# Patient Record
Sex: Female | Born: 1935
Health system: Southern US, Community
[De-identification: ages and names within clinical notes are randomized; demographics above are authoritative.]

## PROBLEM LIST (undated history)

## (undated) DIAGNOSIS — K5730 Diverticulosis of large intestine without perforation or abscess without bleeding: Secondary | ICD-10-CM

## (undated) DIAGNOSIS — K648 Other hemorrhoids: Secondary | ICD-10-CM

## (undated) DIAGNOSIS — I712 Thoracic aortic aneurysm, without rupture: Secondary | ICD-10-CM

## (undated) DIAGNOSIS — Z87898 Personal history of other specified conditions: Secondary | ICD-10-CM

## (undated) DIAGNOSIS — R911 Solitary pulmonary nodule: Secondary | ICD-10-CM

## (undated) DIAGNOSIS — C786 Secondary malignant neoplasm of retroperitoneum and peritoneum: Secondary | ICD-10-CM

## (undated) DIAGNOSIS — K219 Gastro-esophageal reflux disease without esophagitis: Secondary | ICD-10-CM

## (undated) DIAGNOSIS — C801 Malignant (primary) neoplasm, unspecified: Secondary | ICD-10-CM

## (undated) DIAGNOSIS — Z85828 Personal history of other malignant neoplasm of skin: Secondary | ICD-10-CM

## (undated) DIAGNOSIS — Z8719 Personal history of other diseases of the digestive system: Secondary | ICD-10-CM

## (undated) DIAGNOSIS — H3530 Unspecified macular degeneration: Secondary | ICD-10-CM

## (undated) DIAGNOSIS — Z9889 Other specified postprocedural states: Secondary | ICD-10-CM

## (undated) DIAGNOSIS — M1990 Unspecified osteoarthritis, unspecified site: Secondary | ICD-10-CM

## (undated) DIAGNOSIS — I7121 Aneurysm of the ascending aorta, without rupture: Secondary | ICD-10-CM

## (undated) DIAGNOSIS — K449 Diaphragmatic hernia without obstruction or gangrene: Secondary | ICD-10-CM

## (undated) DIAGNOSIS — C189 Malignant neoplasm of colon, unspecified: Secondary | ICD-10-CM

## (undated) DIAGNOSIS — I2510 Atherosclerotic heart disease of native coronary artery without angina pectoris: Secondary | ICD-10-CM

## (undated) DIAGNOSIS — E042 Nontoxic multinodular goiter: Secondary | ICD-10-CM

## (undated) HISTORY — PX: VAGINAL HYSTERECTOMY: SUR661

## (undated) HISTORY — PX: INGUINAL HERNIA REPAIR: SUR1180

## (undated) HISTORY — PX: OTHER SURGICAL HISTORY: SHX169

## (undated) HISTORY — PX: UPPER GASTROINTESTINAL ENDOSCOPY: SHX188

---

## 1997-12-09 ENCOUNTER — Ambulatory Visit (HOSPITAL_COMMUNITY): Admission: RE | Admit: 1997-12-09 | Discharge: 1997-12-09 | Payer: Self-pay | Admitting: Obstetrics & Gynecology

## 1998-06-23 ENCOUNTER — Other Ambulatory Visit: Admission: RE | Admit: 1998-06-23 | Discharge: 1998-06-23 | Payer: Self-pay | Admitting: Obstetrics and Gynecology

## 1999-05-16 ENCOUNTER — Encounter: Payer: Self-pay | Admitting: Obstetrics and Gynecology

## 1999-05-16 ENCOUNTER — Ambulatory Visit (HOSPITAL_COMMUNITY): Admission: RE | Admit: 1999-05-16 | Discharge: 1999-05-16 | Payer: Self-pay | Admitting: Obstetrics and Gynecology

## 1999-06-08 ENCOUNTER — Other Ambulatory Visit: Admission: RE | Admit: 1999-06-08 | Discharge: 1999-06-08 | Payer: Self-pay | Admitting: Obstetrics and Gynecology

## 2000-06-06 ENCOUNTER — Ambulatory Visit (HOSPITAL_COMMUNITY): Admission: RE | Admit: 2000-06-06 | Discharge: 2000-06-06 | Payer: Self-pay | Admitting: Obstetrics and Gynecology

## 2000-06-06 ENCOUNTER — Encounter: Payer: Self-pay | Admitting: Obstetrics and Gynecology

## 2000-08-22 ENCOUNTER — Other Ambulatory Visit: Admission: RE | Admit: 2000-08-22 | Discharge: 2000-08-22 | Payer: Self-pay | Admitting: Obstetrics and Gynecology

## 2001-07-03 ENCOUNTER — Ambulatory Visit (HOSPITAL_COMMUNITY): Admission: RE | Admit: 2001-07-03 | Discharge: 2001-07-03 | Payer: Self-pay | Admitting: Obstetrics and Gynecology

## 2001-07-03 ENCOUNTER — Encounter: Payer: Self-pay | Admitting: Obstetrics and Gynecology

## 2001-07-17 ENCOUNTER — Encounter: Payer: Self-pay | Admitting: Obstetrics and Gynecology

## 2001-07-17 ENCOUNTER — Encounter: Admission: RE | Admit: 2001-07-17 | Discharge: 2001-07-17 | Payer: Self-pay | Admitting: Obstetrics and Gynecology

## 2001-07-31 ENCOUNTER — Encounter: Admission: RE | Admit: 2001-07-31 | Discharge: 2001-07-31 | Payer: Self-pay | Admitting: Family Medicine

## 2001-07-31 ENCOUNTER — Encounter: Payer: Self-pay | Admitting: Family Medicine

## 2002-07-09 ENCOUNTER — Encounter: Payer: Self-pay | Admitting: Obstetrics and Gynecology

## 2002-07-09 ENCOUNTER — Ambulatory Visit (HOSPITAL_COMMUNITY): Admission: RE | Admit: 2002-07-09 | Discharge: 2002-07-09 | Payer: Self-pay | Admitting: Obstetrics and Gynecology

## 2003-07-22 ENCOUNTER — Ambulatory Visit (HOSPITAL_COMMUNITY): Admission: RE | Admit: 2003-07-22 | Discharge: 2003-07-22 | Payer: Self-pay | Admitting: Obstetrics and Gynecology

## 2003-08-03 ENCOUNTER — Ambulatory Visit (HOSPITAL_COMMUNITY): Admission: RE | Admit: 2003-08-03 | Discharge: 2003-08-03 | Payer: Self-pay | Admitting: Family Medicine

## 2003-08-19 ENCOUNTER — Encounter (INDEPENDENT_AMBULATORY_CARE_PROVIDER_SITE_OTHER): Payer: Self-pay | Admitting: Specialist

## 2003-08-19 ENCOUNTER — Ambulatory Visit (HOSPITAL_COMMUNITY): Admission: RE | Admit: 2003-08-19 | Discharge: 2003-08-19 | Payer: Self-pay | Admitting: Family Medicine

## 2004-07-06 ENCOUNTER — Ambulatory Visit (HOSPITAL_COMMUNITY): Admission: RE | Admit: 2004-07-06 | Discharge: 2004-07-06 | Payer: Self-pay | Admitting: Family Medicine

## 2004-08-08 ENCOUNTER — Ambulatory Visit (HOSPITAL_COMMUNITY): Admission: RE | Admit: 2004-08-08 | Discharge: 2004-08-08 | Payer: Self-pay | Admitting: Obstetrics and Gynecology

## 2005-08-23 ENCOUNTER — Ambulatory Visit (HOSPITAL_COMMUNITY): Admission: RE | Admit: 2005-08-23 | Discharge: 2005-08-23 | Payer: Self-pay | Admitting: Obstetrics and Gynecology

## 2006-06-24 ENCOUNTER — Emergency Department (HOSPITAL_COMMUNITY): Admission: EM | Admit: 2006-06-24 | Discharge: 2006-06-24 | Payer: Self-pay | Admitting: Emergency Medicine

## 2006-06-26 ENCOUNTER — Emergency Department (HOSPITAL_COMMUNITY): Admission: EM | Admit: 2006-06-26 | Discharge: 2006-06-26 | Payer: Self-pay | Admitting: *Deleted

## 2006-08-28 ENCOUNTER — Ambulatory Visit (HOSPITAL_COMMUNITY): Admission: RE | Admit: 2006-08-28 | Discharge: 2006-08-28 | Payer: Self-pay | Admitting: Obstetrics and Gynecology

## 2006-11-14 ENCOUNTER — Emergency Department (HOSPITAL_COMMUNITY): Admission: EM | Admit: 2006-11-14 | Discharge: 2006-11-14 | Payer: Self-pay | Admitting: Emergency Medicine

## 2007-02-13 HISTORY — PX: ESOPHAGOGASTRODUODENOSCOPY: SHX1529

## 2007-08-05 ENCOUNTER — Emergency Department (HOSPITAL_COMMUNITY): Admission: EM | Admit: 2007-08-05 | Discharge: 2007-08-05 | Payer: Self-pay | Admitting: Emergency Medicine

## 2007-09-09 ENCOUNTER — Ambulatory Visit (HOSPITAL_COMMUNITY): Admission: RE | Admit: 2007-09-09 | Discharge: 2007-09-09 | Payer: Self-pay | Admitting: Internal Medicine

## 2007-12-04 ENCOUNTER — Encounter: Payer: Self-pay | Admitting: Internal Medicine

## 2007-12-24 ENCOUNTER — Encounter: Payer: Self-pay | Admitting: Internal Medicine

## 2007-12-29 ENCOUNTER — Ambulatory Visit: Payer: Self-pay | Admitting: Internal Medicine

## 2007-12-29 DIAGNOSIS — K219 Gastro-esophageal reflux disease without esophagitis: Secondary | ICD-10-CM | POA: Insufficient documentation

## 2007-12-29 DIAGNOSIS — K222 Esophageal obstruction: Secondary | ICD-10-CM | POA: Insufficient documentation

## 2008-01-15 ENCOUNTER — Ambulatory Visit: Payer: Self-pay | Admitting: Internal Medicine

## 2008-05-06 ENCOUNTER — Ambulatory Visit: Payer: Self-pay | Admitting: Internal Medicine

## 2009-05-12 ENCOUNTER — Ambulatory Visit (HOSPITAL_COMMUNITY): Admission: RE | Admit: 2009-05-12 | Discharge: 2009-05-12 | Payer: Self-pay | Admitting: Obstetrics and Gynecology

## 2009-05-20 ENCOUNTER — Encounter: Admission: RE | Admit: 2009-05-20 | Discharge: 2009-05-20 | Payer: Self-pay | Admitting: Obstetrics and Gynecology

## 2010-05-16 ENCOUNTER — Other Ambulatory Visit (HOSPITAL_COMMUNITY): Payer: Self-pay | Admitting: Obstetrics and Gynecology

## 2010-05-16 DIAGNOSIS — Z1231 Encounter for screening mammogram for malignant neoplasm of breast: Secondary | ICD-10-CM

## 2010-06-05 ENCOUNTER — Ambulatory Visit (HOSPITAL_COMMUNITY)
Admission: RE | Admit: 2010-06-05 | Discharge: 2010-06-05 | Disposition: A | Discharge status: Home / Self Care | Payer: 59 | Source: Ambulatory Visit | Attending: Obstetrics and Gynecology | Admitting: Obstetrics and Gynecology

## 2010-06-05 DIAGNOSIS — Z1231 Encounter for screening mammogram for malignant neoplasm of breast: Secondary | ICD-10-CM | POA: Insufficient documentation

## 2010-06-07 ENCOUNTER — Other Ambulatory Visit: Payer: Self-pay | Admitting: Obstetrics and Gynecology

## 2010-06-07 DIAGNOSIS — R928 Other abnormal and inconclusive findings on diagnostic imaging of breast: Secondary | ICD-10-CM

## 2010-06-09 ENCOUNTER — Ambulatory Visit
Admission: RE | Admit: 2010-06-09 | Discharge: 2010-06-09 | Disposition: A | Discharge status: Home / Self Care | Payer: 59 | Source: Ambulatory Visit | Attending: Obstetrics and Gynecology | Admitting: Obstetrics and Gynecology

## 2010-06-09 DIAGNOSIS — R928 Other abnormal and inconclusive findings on diagnostic imaging of breast: Secondary | ICD-10-CM

## 2010-10-25 ENCOUNTER — Telehealth: Payer: Self-pay | Admitting: Internal Medicine

## 2010-10-25 NOTE — Telephone Encounter (Signed)
Dr Sonny Masters rlast colon in 2009, see under procedure tab.  Please advise proper recall

## 2010-10-25 NOTE — Telephone Encounter (Signed)
Unable to reach the patient I will continue to try and reach the patient

## 2010-10-25 NOTE — Telephone Encounter (Signed)
Would not recommend a colonoscopy based upon this  She can schedule an office visit to see me (elective) Need records from Dr. Felipa Eth

## 2010-10-25 NOTE — Telephone Encounter (Signed)
She has a small amount of rectal bleeding one time  No further bleeding.   She says Dr Felipa Eth did a rectal exam and said she has no hemorrhoids.   Dr Leone Payor please advise

## 2010-10-25 NOTE — Telephone Encounter (Signed)
Does not appear to be true considering no polyps on last colonoscopy Is she having signs or symptoms or did they do a hemoccult?

## 2010-10-26 NOTE — Telephone Encounter (Signed)
I spoke with the patient and advised Dr Marvell Fuller recommendations.  She will call and schedule an office visit if she has recurrent rectal bleeding

## 2010-12-08 ENCOUNTER — Other Ambulatory Visit: Payer: Self-pay

## 2011-05-08 ENCOUNTER — Other Ambulatory Visit (HOSPITAL_COMMUNITY): Payer: Self-pay | Admitting: Obstetrics and Gynecology

## 2011-05-08 DIAGNOSIS — Z1231 Encounter for screening mammogram for malignant neoplasm of breast: Secondary | ICD-10-CM

## 2011-06-07 ENCOUNTER — Ambulatory Visit (HOSPITAL_COMMUNITY): Payer: 59 | Attending: Obstetrics and Gynecology

## 2011-07-02 ENCOUNTER — Ambulatory Visit (HOSPITAL_COMMUNITY)
Admission: RE | Admit: 2011-07-02 | Discharge: 2011-07-02 | Disposition: A | Discharge status: Home / Self Care | Payer: Medicare Other | Source: Ambulatory Visit | Attending: Obstetrics and Gynecology | Admitting: Obstetrics and Gynecology

## 2011-07-02 DIAGNOSIS — Z1231 Encounter for screening mammogram for malignant neoplasm of breast: Secondary | ICD-10-CM | POA: Insufficient documentation

## 2011-08-13 ENCOUNTER — Other Ambulatory Visit: Payer: Self-pay | Admitting: Internal Medicine

## 2011-08-13 DIAGNOSIS — E041 Nontoxic single thyroid nodule: Secondary | ICD-10-CM

## 2011-08-15 ENCOUNTER — Ambulatory Visit
Admission: RE | Admit: 2011-08-15 | Discharge: 2011-08-15 | Disposition: A | Discharge status: Home / Self Care | Payer: 59 | Source: Ambulatory Visit | Attending: Internal Medicine | Admitting: Internal Medicine

## 2011-08-15 ENCOUNTER — Other Ambulatory Visit: Payer: 59

## 2011-08-15 DIAGNOSIS — E041 Nontoxic single thyroid nodule: Secondary | ICD-10-CM

## 2011-09-10 ENCOUNTER — Other Ambulatory Visit: Payer: Self-pay | Admitting: Dermatology

## 2011-11-13 HISTORY — PX: MOHS SURGERY: SUR867

## 2012-01-23 ENCOUNTER — Telehealth: Payer: Self-pay | Admitting: Critical Care Medicine

## 2012-01-23 NOTE — Telephone Encounter (Signed)
Will forward to Crystal to keep an eye out for the paperwork.

## 2012-01-24 ENCOUNTER — Encounter (HOSPITAL_COMMUNITY): Payer: Self-pay | Admitting: *Deleted

## 2012-01-24 ENCOUNTER — Observation Stay (HOSPITAL_COMMUNITY)
Admission: EM | Admit: 2012-01-24 | Discharge: 2012-01-25 | Disposition: A | Discharge status: Home / Self Care | Payer: Medicare Other | Attending: Internal Medicine | Admitting: Internal Medicine

## 2012-01-24 DIAGNOSIS — K625 Hemorrhage of anus and rectum: Secondary | ICD-10-CM | POA: Diagnosis present

## 2012-01-24 DIAGNOSIS — I951 Orthostatic hypotension: Secondary | ICD-10-CM | POA: Insufficient documentation

## 2012-01-24 DIAGNOSIS — K219 Gastro-esophageal reflux disease without esophagitis: Secondary | ICD-10-CM | POA: Insufficient documentation

## 2012-01-24 DIAGNOSIS — K5731 Diverticulosis of large intestine without perforation or abscess with bleeding: Principal | ICD-10-CM | POA: Insufficient documentation

## 2012-01-24 DIAGNOSIS — R109 Unspecified abdominal pain: Secondary | ICD-10-CM | POA: Insufficient documentation

## 2012-01-24 DIAGNOSIS — K222 Esophageal obstruction: Secondary | ICD-10-CM

## 2012-01-24 HISTORY — DX: Other hemorrhoids: K64.8

## 2012-01-24 HISTORY — DX: Unspecified osteoarthritis, unspecified site: M19.90

## 2012-01-24 HISTORY — DX: Diaphragmatic hernia without obstruction or gangrene: K44.9

## 2012-01-24 HISTORY — DX: Gastro-esophageal reflux disease without esophagitis: K21.9

## 2012-01-24 HISTORY — DX: Diverticulosis of large intestine without perforation or abscess without bleeding: K57.30

## 2012-01-24 LAB — CBC WITH DIFFERENTIAL/PLATELET
Basophils Absolute: 0 10*3/uL (ref 0.0–0.1)
HCT: 40 % (ref 36.0–46.0)
Hemoglobin: 13.6 g/dL (ref 12.0–15.0)
Lymphocytes Relative: 10 % — ABNORMAL LOW (ref 12–46)
Monocytes Absolute: 0.8 10*3/uL (ref 0.1–1.0)
Monocytes Relative: 6 % (ref 3–12)
Neutro Abs: 11.3 10*3/uL — ABNORMAL HIGH (ref 1.7–7.7)
RBC: 4.49 MIL/uL (ref 3.87–5.11)
WBC: 13.7 10*3/uL — ABNORMAL HIGH (ref 4.0–10.5)

## 2012-01-24 LAB — CBC
Hemoglobin: 12 g/dL (ref 12.0–15.0)
Hemoglobin: 11.9 g/dL — ABNORMAL LOW (ref 12.0–15.0)
MCH: 30 pg (ref 26.0–34.0)
MCHC: 33.9 g/dL (ref 30.0–36.0)
Platelets: 158 10*3/uL (ref 150–400)
RBC: 3.97 MIL/uL (ref 3.87–5.11)
RBC: 3.97 MIL/uL (ref 3.87–5.11)
WBC: 6.3 10*3/uL (ref 4.0–10.5)

## 2012-01-24 LAB — COMPREHENSIVE METABOLIC PANEL
ALT: 17 U/L (ref 0–35)
AST: 27 U/L (ref 0–37)
Alkaline Phosphatase: 81 U/L (ref 39–117)
Calcium: 9.2 mg/dL (ref 8.4–10.5)
GFR calc Af Amer: 80 mL/min — ABNORMAL LOW (ref >90)
Glucose, Bld: 101 mg/dL — ABNORMAL HIGH (ref 70–99)
Potassium: 4.1 mEq/L (ref 3.5–5.1)
Sodium: 142 mEq/L (ref 135–145)
Total Protein: 6.3 g/dL (ref 6.0–8.3)

## 2012-01-24 LAB — BASIC METABOLIC PANEL
BUN: 18 mg/dL (ref 6–23)
CO2: 27 mEq/L (ref 19–32)
Chloride: 104 mEq/L (ref 96–112)
Creatinine, Ser: 0.94 mg/dL (ref 0.50–1.10)
Potassium: 4 mEq/L (ref 3.5–5.1)

## 2012-01-24 LAB — ABO/RH: ABO/RH(D): A POS

## 2012-01-24 LAB — PROTIME-INR: Prothrombin Time: 15.1 seconds (ref 11.6–15.2)

## 2012-01-24 LAB — TYPE AND SCREEN: ABO/RH(D): A POS

## 2012-01-24 LAB — OCCULT BLOOD, POC DEVICE: Fecal Occult Bld: POSITIVE

## 2012-01-24 MED ORDER — ACETAMINOPHEN 325 MG PO TABS: 650.0000 mg | ORAL_TABLET | Freq: Four times a day (QID) | ORAL | Status: DC | PRN

## 2012-01-24 MED ORDER — ACETAMINOPHEN 650 MG RE SUPP: 650.0000 mg | Freq: Four times a day (QID) | RECTAL | Status: DC | PRN

## 2012-01-24 MED ORDER — SODIUM CHLORIDE 0.9 % IJ SOLN
3.0000 mL | Freq: Two times a day (BID) | INTRAMUSCULAR | Status: DC
  Administered 2012-01-24: 3 mL via INTRAVENOUS

## 2012-01-24 MED ORDER — ONDANSETRON HCL 4 MG PO TABS: 4.0000 mg | ORAL_TABLET | Freq: Four times a day (QID) | ORAL | Status: DC | PRN

## 2012-01-24 MED ORDER — ONDANSETRON HCL 4 MG/2ML IJ SOLN: 4.0000 mg | Freq: Four times a day (QID) | INTRAMUSCULAR | Status: DC | PRN

## 2012-01-24 MED ORDER — SODIUM CHLORIDE 0.9 % IV SOLN: INTRAVENOUS | Status: DC

## 2012-01-24 MED ORDER — ONDANSETRON HCL 4 MG/2ML IJ SOLN: 4.0000 mg | Freq: Three times a day (TID) | INTRAMUSCULAR | Status: DC | PRN

## 2012-01-24 MED ORDER — SODIUM CHLORIDE 0.9 % IV SOLN
INTRAVENOUS | Status: DC
  Administered 2012-01-24: 11:00:00 via INTRAVENOUS

## 2012-01-24 NOTE — H&P (Signed)
Heidi Marquez is an 78 y.o. female.  Patient was seen and examined on January 24, 2012. PCP - Dr. Felipa Eth.  Chief Complaint: Rectal bleeding. HPI: 78 year old female with history of previous diverticular bleed 4 years ago and colonoscopy done at that time showed diverticulosis presents with complaints of having rectal bleeding. Patient has had at least 4 episodes rectal bleeding prior to arriving at the ER. Patient states at least 2 of them a large amount of blood. Denies any dizziness or weakness. In the ER patient was found to be mildly orthostatic and hemoglobin is around 13 at this time has been admitted for further management. Patient has some vague discomfort in the abdomen before each bowel movement otherwise has no abdominal pain. Denies any nausea vomiting fever chills or any recent use of antibiotics.  Past Medical History  Diagnosis Date  . Diverticulosis of intestine     Past Surgical History  Procedure Date  . Abdominal hysterectomy   . Bladder surgery     History reviewed. No pertinent family history. Social History:  reports that she has been passively smoking.  She does not have any smokeless tobacco history on file. She reports that she does not drink alcohol or use illicit drugs.  Allergies:  Allergies  Allergen Reactions  . Penicillins Swelling  . Sulfonamide Derivatives Swelling     (Not in a hospital admission)  Results for orders placed during the hospital encounter of 01/24/12 (from the past 48 hour(s))  OCCULT BLOOD, POC DEVICE     Status: Normal   Collection Time   01/24/12  3:17 AM      Component Value Range Comment   Fecal Occult Bld POSITIVE     TYPE AND SCREEN     Status: Normal   Collection Time   01/24/12  3:18 AM      Component Value Range Comment   ABO/RH(D) A POS      Antibody Screen NEG      Sample Expiration 01/27/2012     CBC WITH DIFFERENTIAL     Status: Abnormal   Collection Time   01/24/12  3:24 AM      Component Value Range  Comment   WBC 13.7 (*) 4.0 - 10.5 K/uL    RBC 4.49  3.87 - 5.11 MIL/uL    Hemoglobin 13.6  12.0 - 15.0 g/dL    HCT 11.9  14.7 - 82.9 %    MCV 89.1  78.0 - 100.0 fL    MCH 30.3  26.0 - 34.0 pg    MCHC 34.0  30.0 - 36.0 g/dL    RDW 56.2  13.0 - 86.5 %    Platelets 172  150 - 400 K/uL    Neutrophils Relative 83 (*) 43 - 77 %    Neutro Abs 11.3 (*) 1.7 - 7.7 K/uL    Lymphocytes Relative 10 (*) 12 - 46 %    Lymphs Abs 1.4  0.7 - 4.0 K/uL    Monocytes Relative 6  3 - 12 %    Monocytes Absolute 0.8  0.1 - 1.0 K/uL    Eosinophils Relative 2  0 - 5 %    Eosinophils Absolute 0.2  0.0 - 0.7 K/uL    Basophils Relative 0  0 - 1 %    Basophils Absolute 0.0  0.0 - 0.1 K/uL   BASIC METABOLIC PANEL     Status: Abnormal   Collection Time   01/24/12  3:24 AM  Component Value Range Comment   Sodium 142  135 - 145 mEq/L    Potassium 4.0  3.5 - 5.1 mEq/L    Chloride 104  96 - 112 mEq/L    CO2 27  19 - 32 mEq/L    Glucose, Bld 113 (*) 70 - 99 mg/dL    BUN 18  6 - 23 mg/dL    Creatinine, Ser 1.19  0.50 - 1.10 mg/dL    Calcium 14.7  8.4 - 10.5 mg/dL    GFR calc non Af Amer 57 (*) >90 mL/min    GFR calc Af Amer 67 (*) >90 mL/min    No results found.  Review of Systems  Constitutional: Negative.   HENT: Negative.   Eyes: Negative.   Respiratory: Negative.   Cardiovascular: Negative.   Gastrointestinal:       Rectal bleeding.  Genitourinary: Negative.   Musculoskeletal: Negative.   Skin: Negative.   Neurological: Negative.   Endo/Heme/Allergies: Negative.   Psychiatric/Behavioral: Negative.     Blood pressure 139/70, pulse 85, temperature 98.1 F (36.7 C), temperature source Oral, resp. rate 18, SpO2 95.00%. Physical Exam  Constitutional: She is oriented to person, place, and time. She appears well-developed and well-nourished. No distress.  HENT:  Head: Normocephalic and atraumatic.  Right Ear: External ear normal.  Left Ear: External ear normal.  Nose: Nose normal.   Mouth/Throat: Oropharynx is clear and moist. No oropharyngeal exudate.  Eyes: Conjunctivae normal are normal. Pupils are equal, round, and reactive to light. Right eye exhibits no discharge. Left eye exhibits no discharge. No scleral icterus.  Neck: Normal range of motion. Neck supple.  Cardiovascular: Normal rate and regular rhythm.   Respiratory: Effort normal and breath sounds normal. No respiratory distress. She has no wheezes. She has no rales.  GI: Soft. Bowel sounds are normal. She exhibits no distension. There is no tenderness. There is no rebound.  Musculoskeletal: She exhibits no edema and no tenderness.  Neurological: She is alert and oriented to person, place, and time.  Skin: Skin is warm and dry. She is not diaphoretic.     Assessment/Plan #1. Rectal bleeding with history of diverticulosis and previous diverticular bleed - recheck CBC and if there is significant hemoglobin drop and transfuse. May consult Dr. Leone Payor in a.m. who has seen patient previously. Continue gentle hydration.  CODE STATUS - full code.  Selwyn Reason N. 01/24/2012, 5:10 AM

## 2012-01-24 NOTE — Care Management Note (Unsigned)
    Page 1 of 1   01/24/2012     2:52:08 PM   CARE MANAGEMENT NOTE 01/24/2012  Patient:  Heidi Marquez, Heidi Marquez   Account Number:  [id number]  Date Initiated:  01/24/2012  Documentation initiated by:  Farron Lafond  Subjective/Objective Assessment:   PT ADM WITH RECTAL BLEED ON 01/24/12.  PTA, PT INDEPENDENT, LIVES WITH SPOUSE.     Action/Plan:   WILL FOLLOW FOR HOME NEEDS AS PT PROGRESSES.   Anticipated DC Date:  01/26/2012   Anticipated DC Plan:  HOME/SELF CARE      DC Planning Services  CM consult      Choice offered to / List presented to:             Status of service:  In process, will continue to follow Medicare Important Message given?   (If response is "NO", the following Medicare IM given date fields will be blank) Date Medicare IM given:   Date Additional Medicare IM given:    Discharge Disposition:    Per UR Regulation:  Reviewed for med. necessity/level of care/duration of stay  If discussed at Long Length of Stay Meetings, dates discussed:    Comments:

## 2012-01-24 NOTE — ED Notes (Signed)
Attempted to call report. Floor RN unable to accept report.  

## 2012-01-24 NOTE — Progress Notes (Signed)
[facility] PROGRESS NOTE  Heidi Marquez:[id number] DOB: 13-Nov-1933 DOA: 01/24/2012 PCP: Hoyle Sauer, MD  HPI:  78 year old female with history of previous diverticular bleed 4 years ago and colonoscopy done at that time showed diverticulosis presents with complaints of having rectal bleeding. Patient has had at least 4 episodes rectal bleeding prior to arriving at the ER. Patient states at least 2 of them a large amount of blood. Denies any dizziness or weakness. In the ER patient was found to be mildly orthostatic and hemoglobin is around 13 at this time has been admitted for further management. Patient has some vague discomfort in the abdomen before each bowel movement otherwise has no abdominal pain. Denies any nausea vomiting fever chills or any recent use of antibiotics.  Assessment/Plan:  Bright red bleeding per rectum/LGIB  Likely diverticular or hemorrhoidal in origin.  Patient had a total of 6 bloody BM's thus far, last one early this morning. Some stools are present. She has seen clots too.  Lower abdominal pain has resolved.  Hemodynamically stable.  Hemoglobin has dropped from 13.6-12 g/dL. Will monitor CBC closely. Check PT and INR at next lab draw.  [facility] has been consulted-conservative management with observation versus colonoscopy/flexible sigmoidoscopy. Last colonoscopy approximately 4 years ago by Dr. Leone Payor.  Continue clear liquids.  Patient not on aspirin or anticoagulants.  Mild orthostatic hypotension  Gentle IV fluids.  Likely from acute blood loss.  GERD  Without significant symptoms.  Not on PPIs.   Code Status: Full Family Communication: Discussed with patient. Disposition Plan: Home when medically stable.   Consultants:  [facility].  Procedures:  None  Antibiotics:  None  HPI/Subjective: Complains of generalized weakness. No further lower abdominal pain. Total of 6 bloody BMs  since onset in the last 1-2 early this morning while in the ED. No dizziness or lightheadedness or chest pain.  Objective: Filed Vitals:   01/24/12 0500 01/24/12 0600 01/24/12 0612 01/24/12 0652  BP: 141/67 112/69 112/69 136/64  Pulse: 80 85 89 77  Temp:   98.9 F (37.2 C) 98.4 F (36.9 C)  TempSrc:   Oral   Resp: 18 18 16 18   Height:    5\' 4"  (1.626 m)  Weight:    67.586 kg (149 lb)  SpO2: 96% 94% 95% 95%   No intake or output data in the 24 hours ending 01/24/12 1041 Filed Weights   01/24/12 0652  Weight: 67.586 kg (149 lb)    Exam:   General exam: Comfortable.  Respiratory system: Clear to auscultation. No increased work of breathing.  Cardiovascular system: First and second heart sounds heard, regular rate and rhythm. No JVD, murmurs or gallops. Telemetry shows sinus rhythm.  Gastrointestinal system: Abdomen is nondistended, soft and nontender. Normal bowel sounds heard.  Central nervous system: Alert and oriented. No focal neurological deficits.  Extremities: Symmetric 5 x 5 power.  Data Reviewed: Basic Metabolic Panel:  Lab 01/24/12 4010 01/24/12 0324  NA 142 142  K 4.1 4.0  CL 106 104  CO2 27 27  GLUCOSE 101* 113*  BUN 16 18  CREATININE 0.81 0.94  CALCIUM 9.2 10.1  MG -- --  PHOS -- --   Liver Function Tests:  Lab 01/24/12 0823  AST 27  ALT 17  ALKPHOS 81  BILITOT 0.8  PROT 6.3  ALBUMIN 3.4*   No results found for this basename: LIPASE:5,AMYLASE:5 in the last 168 hours No results found for this basename: AMMONIA:5 in the last 168 hours  CBC:  Lab 01/24/12 0823 01/24/12 0324  WBC 9.2 13.7*  NEUTROABS -- 11.3*  HGB 12.0 13.6  HCT 35.4* 40.0  MCV 89.2 89.1  PLT 158 172   Cardiac Enzymes: No results found for this basename: CKTOTAL:5,CKMB:5,CKMBINDEX:5,TROPONINI:5 in the last 168 hours BNP (last 3 results) No results found for this basename: PROBNP:3 in the last 8760 hours CBG: No results found for this basename: GLUCAP:5 in the last  168 hours  No results found for this or any previous visit (from the past 240 hour(s)).   Studies: No results found.  Scheduled Meds:    . sodium chloride  3 mL Intravenous Q12H  . [DISCONTINUED] sodium chloride   Intravenous STAT   Continuous Infusions:    . sodium chloride 50 mL/hr at 01/24/12 1034  . [DISCONTINUED] sodium chloride      Principal Problem:  *Rectal bleeding    Time spent: 20 minutes    [facility]  [facility] Pager [phone]. If 8PM-8AM, please contact night-coverage at [email or web address], password [facility] 01/24/2012, 10:41 AM  LOS: 0 days

## 2012-01-24 NOTE — Consult Note (Signed)
[facility] Consult: 11:41 AM 01/24/2012   Referring Provider: Dr Waymon Amato  Primary Care Physician:  Hoyle Sauer, MD Primary Gastroenterologist:  Dr. Stan Head  Reason for Consultation:  Hematochezia  HPI: Heidi Marquez is a 78 y.o. female.  Hx diverticulosis and int hemorrhoids on 2009 colonoscopy done following episode of hematochezia.  Did not require hospitalization or transfusions.   Esophageal stricture dilated in 2009.  Between 2130 yesterday evening and 0300 today had four episodes of moderate to large volume hematochezia.  2 more episodes in ED were of smaller volume and darker in color of blood.  The last was blood mixed with brown stool.  No further stools/blood since arrival to unit 2000.  Describes one episode of nausea but never vomited.  Sense of discomfort, 3/10 intensity, in mid pelvic region which has resolved.  No tenderness on abd exam.   Takes 200 mg Ibuprofen daily in AM for arthritic pain in knees.  Due to have repeat steroid injection to the right knee in the next few weeks.  Takes about 2 Tums per day for reflux sxs, does not take PPI, no dysphagia.  Does take Fosamax once per week.   ENDOSCOPIC STUDIES: 04/2007  EGD  For GER sxs and dysphagia 1) DISTAL ESOPHAGEAL STRICTURE (RING-LIKE) DILATED WITH 54 FR MALONEY  DILATOR  2) REFLUX ESOPHAGITIS  3) 2 CM SLIDING HIATAL HERNIA  4) OTHERWISE NORMAL  04/2007 Colonoscopy for hematochezia.  To cecum 1) SEVERE SIGMOID DIVERTICULOSIS AND DIFFUSE DIVERTICULOSIS OVERALL  2) INTERNAL HEMORRHOIDS  3) OTHERWISE NORMAL COLONOSCOPY TO CECUM, GOOD PREP  EITHER DIVERTICULOSIS OR HEMORRHOIDS COULD HAVE CAUSED THE BLEEDING  NO SIGNS OF THAT NOW     Past Medical History  Diagnosis Date  . Diverticulosis of colon     severe in sigmoid but diffusley throughout colon  on 2009 colonoscopy  . Internal hemorrhoids   . Hematochezia 2009  . Benign esophageal stricture  04/2007    EGD with dilatation.  Marland Kitchen GERD (gastroesophageal reflux disease)   . HH (hiatus hernia)   . Macular degeneration   . Arthritis   . IBS (irritable bowel syndrome)   . Epistaxis ~ 2005    required cauterization twice.     Past Surgical History  Procedure Date  . Abdominal hysterectomy     partial with the bladder tack  . Bladder tack   . Appendectomy   . Retinal laser surgery   . Inguinal hernia repair early 1990s    right  . Basal cell resection 08/2011, 11/2011    initially biopsy 08/3011.  repeat excision right nose with grafting from the skin behind the right ear    Prior to Admission medications   Medication Sig Start Date End Date Taking? Authorizing Provider  alendronate (FOSAMAX) 70 MG tablet Take 70 mg by mouth every 7 (seven) days. Tuesdays. Take with a full glass of water on an empty stomach.   Yes Historical Provider, MD  Ibuprofen   200 mg                  Once every AM Tums                                       About 2 per day   Scheduled Meds: None   Infusions:   . sodium chloride     PRN Meds: acetaminophen, acetaminophen, ondansetron (ZOFRAN) IV, ondansetron,  Allergies as of 01/24/2012 - Review Complete 01/24/2012  Allergen Reaction Noted  . Penicillins Swelling 01/24/2012  . Sulfonamide derivatives Swelling 12/29/2007    Family history. Colon cancer in pat GF Colitis/Crohns in Dad and 2 paternal aunts.  Diverticulitis in Dad.  Diverticular bleeds in aunts.   History   Social History  . Marital Status: Married    Spouse Name: N/A    Number of Children: N/A  . Years of Education: N/A   Occupational History  . Systems developer.  Retired about 6 weeks ago   Social History Main Topics  . Smoking status: Passive Smoke Exposure - Never Smoker  . Smokeless tobacco: Not on file  . Alcohol Use: No  . Drug Use: No  . Sexually Active:    Social History Narrative  . Active running her household.  Climbs stairs at home.  Does yard  work.  Her husband had forefoot amputation 6 weeks ago.    REVIEW OF SYSTEMS: Constitutional:  No weight loss, no fatigue ENT:  No nose bleeds Pulm:  No cough or dyspnea CV:  No palpitations or chest pain. GU:  No hematuria or frequency GI:  Per HPI Heme:  Took iron as younger woman when she had heavy menstrual bleeding but it caused bad constipation. .    Transfusions:  None ever Neuro:  Has a headache now, present since last PM. Derm:  Recent reexcison of basal cell from right nose, with skin graft Endocrine:  No excessive thirst or polyuria.  No sweats Immunization:  Flu shot is current.  Travel:  None beyond local area.    PHYSICAL EXAM: Vital signs in last 24 hours: Temp:  [98.1 F (36.7 C)-98.9 F (37.2 C)] 98.4 F (36.9 C) (12/12 0652) Pulse Rate:  [77-98] 77  (12/12 0652) Resp:  [16-21] 18  (12/12 0652) BP: (112-152)/(64-87) 136/64 mmHg (12/12 0652) SpO2:  [94 %-98 %] 95 % (12/12 0652) Weight:  [67.586 kg (149 lb)] 67.586 kg (149 lb) (12/12 4098)  General: well appearing older WF.  Comfortable.  Head:  No asymmetry or facial swelling.   Eyes:  No icterus or pallor.  EOMI Ears:  Not HOH  Nose:  No discharge or sneezing.  Healing scar on right nose Mouth:  Good teeth, smile symmetric.  Moist and clear oral MM.  Neck:  No JVD, No mass.  No TMG.  No bruits Lungs:  Crackles in both bases.  No resp distress or cough.  Heart: RRR.  No MRG Abdomen:  Soft, NT, no bruits, no hernias, no masses or HSM.  Not distended.   Rectal: deferred.    Musc/Skeltl: arthritic deformity and effusions visible in both knees.  Extremities:  No pedal edema  Neurologic:  Oriented x 3.  No focal deficits.  Limb stength full Bil.  Skin:  No rash or angiomata.  Tattoos:  none Nodes:  No inguinal or cervical colonoscopy   Psych:  Pleasant, cooperative, relaxed.    LAB RESULTS:  Basename 01/24/12 0823 01/24/12 0324  WBC 9.2 13.7*  HGB 12.0 13.6  HCT 35.4* 40.0  PLT 158 172   BMET Lab  Results  Component Value Date   NA 142 01/24/2012   NA 142 01/24/2012   K 4.1 01/24/2012   K 4.0 01/24/2012   CL 106 01/24/2012   CL 104 01/24/2012   CO2 27 01/24/2012   CO2 27 01/24/2012   GLUCOSE 101* 01/24/2012   GLUCOSE 113* 01/24/2012   BUN 16 01/24/2012  BUN 18 01/24/2012   CREATININE 0.81 01/24/2012   CREATININE 0.94 01/24/2012   CALCIUM 9.2 01/24/2012   CALCIUM 10.1 01/24/2012   LFT  Basename 01/24/12 0823  PROT 6.3  ALBUMIN 3.4*  AST 27  ALT 17  ALKPHOS 81  BILITOT 0.8  BILIDIR --  IBILI --   PT/INR No results found for this basename: INR,  PROTIME    RADIOLOGY STUDIES: None  IMPRESSION: *  Hematochezia, relatively painless.  This is a LGI bleed.  Suspect diverticular bleed.  Ischemic colitis can also cause hematochezia but not usually in large volume and would expect more abdominal pain and tenderness if ischemic cause. Fortunately she is not anemic though blood count has dropped 1.6 grams.  Ibuprofen can cause LGI tract ulcer but this occurs much less frequently than NSAID induced UGI tract ulcers.   *  Hx GERD and esophageal stricture.  No longer using PPI.  No dysphagia.    PLAN: *  Change CBC from q 4 hours to BID.  Check coags.  *  No colonoscopy.  If continues bleeding:  Initiate nuclear RBC scan to angiogram/embolization pathway *  Stop Ibuprofen as done, to allow her platelets to recover full clotting capacity.     LOS: 0 days   Jennye Moccasin  01/24/2012, 11:41 AM Pager: [phone]  I have personally taken an interval history, reviewed the chart, and examined the patient.  I agree with the extender's note, impression and recommendations.  Suspect diverticular bleed.  Barbette Hair. Arlyce Dice, MD, [facility] [phone]

## 2012-01-24 NOTE — Progress Notes (Signed)
Utilization Review Completed.Shanera Meske T12/01/2012   

## 2012-01-24 NOTE — ED Notes (Signed)
Pt c/o passing blood with bowel movements x 4 since 2130 last night accompanied by abd pain.  Hx of diverticulosis.  Denies taking any blood thinners.  Pt is unable to states how much bleeding, but states "it's a lot".

## 2012-01-24 NOTE — ED Provider Notes (Signed)
History     CSN: [id number]  Arrival date & time 01/24/12  0251   First MD Initiated Contact with Patient 01/24/12 0259      Chief Complaint  Patient presents with  . Rectal Bleeding  . Abdominal Pain    (Consider location/radiation/quality/duration/timing/severity/associated sxs/prior treatment) Patient is a 78 y.o. female presenting with hematochezia and abdominal pain. The history is provided by the patient.  Rectal Bleeding  Associated symptoms include abdominal pain.  Abdominal Pain The primary symptoms of the illness include abdominal pain and hematochezia.  She noted onset at about 9 PM of passing bright red blood per rectum. She has had 4 bowel movements with a fairly large amount of blood being passed. There's been some lower abdominal cramping which is relatively mild and she rates it at a 4/10. There's been mild nausea but no vomiting. She denies fever, chills, sweats. He denies back pain. She denies dizziness or lightheadedness. She has had rectal bleeding before and it was felt to be due to diverticulosis.  Past Medical History  Diagnosis Date  . Diverticulosis of intestine     Past Surgical History  Procedure Date  . Abdominal hysterectomy   . Bladder surgery     No family history on file.  History  Substance Use Topics  . Smoking status: Passive Smoke Exposure - Never Smoker  . Smokeless tobacco: Not on file  . Alcohol Use: No    OB History    Grav Para Term Preterm Abortions TAB SAB Ect Mult Living                  Review of Systems  Gastrointestinal: Positive for abdominal pain and hematochezia.  All other systems reviewed and are negative.    Allergies  Penicillins and Sulfonamide derivatives  Home Medications  No current outpatient prescriptions on file.  BP 152/87  Pulse 98  Temp 98.1 F (36.7 C) (Oral)  Resp 18  SpO2 98%  Physical Exam  Nursing note and vitals reviewed.  78 year old female, resting comfortably and in no  acute distress. Vital signs are significant for mild hypertension with blood pressure 152/87. Oxygen saturation is 98%, which is normal. Head is normocephalic and atraumatic. PERRLA, EOMI. Oropharynx is clear. Neck is nontender and supple without adenopathy or JVD. Back is nontender and there is no CVA tenderness. Lungs are clear without rales, wheezes, or rhonchi. Chest is nontender. Heart has regular rate and rhythm without murmur. Abdomen is soft, flat, with mild lower abdominal tenderness, without masses or hepatosplenomegaly and peristalsis is normoactive. Rectal: Normal sphincter tone, moderate amount of bright red blood present in the rectal vault. Extremities have no cyanosis or edema, full range of motion is present. Skin is warm and dry without rash. Neurologic: Mental status is normal, cranial nerves are intact, there are no motor or sensory deficits.  ED Course  Procedures (including critical care time)  Results for orders placed during the hospital encounter of 01/24/12  CBC WITH DIFFERENTIAL      Component Value Range   WBC 13.7 (*) 4.0 - 10.5 K/uL   RBC 4.49  3.87 - 5.11 MIL/uL   Hemoglobin 13.6  12.0 - 15.0 g/dL   HCT 14.7  82.9 - 56.2 %   MCV 89.1  78.0 - 100.0 fL   MCH 30.3  26.0 - 34.0 pg   MCHC 34.0  30.0 - 36.0 g/dL   RDW 13.0  86.5 - 78.4 %   Platelets 172  150 -  400 K/uL   Neutrophils Relative 83 (*) 43 - 77 %   Neutro Abs 11.3 (*) 1.7 - 7.7 K/uL   Lymphocytes Relative 10 (*) 12 - 46 %   Lymphs Abs 1.4  0.7 - 4.0 K/uL   Monocytes Relative 6  3 - 12 %   Monocytes Absolute 0.8  0.1 - 1.0 K/uL   Eosinophils Relative 2  0 - 5 %   Eosinophils Absolute 0.2  0.0 - 0.7 K/uL   Basophils Relative 0  0 - 1 %   Basophils Absolute 0.0  0.0 - 0.1 K/uL  BASIC METABOLIC PANEL      Component Value Range   Sodium 142  135 - 145 mEq/L   Potassium 4.0  3.5 - 5.1 mEq/L   Chloride 104  96 - 112 mEq/L   CO2 27  19 - 32 mEq/L   Glucose, Bld 113 (*) 70 - 99 mg/dL   BUN 18   6 - 23 mg/dL   Creatinine, Ser 1.19  0.50 - 1.10 mg/dL   Calcium 14.7  8.4 - 82.9 mg/dL   GFR calc non Af Amer 57 (*) >90 mL/min   GFR calc Af Amer 67 (*) >90 mL/min  TYPE AND SCREEN      Component Value Range   ABO/RH(D) A POS     Antibody Screen NEG     Sample Expiration 01/27/2012    OCCULT BLOOD, POC DEVICE      Component Value Range   Fecal Occult Bld POSITIVE       1. Rectal bleed       MDM  Lower GI bleeding which is most likely from diverticulosis. Orthostatic vital signs will be checked as well as hemoglobin.  Hemoglobin is normal, but orthostatic vital signs did show a significant drop in blood pressure. Given the amount of bleeding and then she reports and the fact that it has not been going on for long which would not give time for hemoglobin 2 to grade 2 show fracture of blood loss, it is elected to admit her under observation status to monitor her hemoglobin and consider GI consultation and consider transfusion if hemoglobin drops significantly. Case is discussed with Dr.Kakrakandy of [facility] who agrees to admit the patient.     Dione Booze, MD 01/24/12 [phone]

## 2012-01-25 DIAGNOSIS — K625 Hemorrhage of anus and rectum: Secondary | ICD-10-CM

## 2012-01-25 DIAGNOSIS — K5731 Diverticulosis of large intestine without perforation or abscess with bleeding: Principal | ICD-10-CM

## 2012-01-25 DIAGNOSIS — I951 Orthostatic hypotension: Secondary | ICD-10-CM

## 2012-01-25 LAB — BASIC METABOLIC PANEL
BUN: 12 mg/dL (ref 6–23)
Chloride: 105 mEq/L (ref 96–112)
GFR calc Af Amer: 79 mL/min — ABNORMAL LOW (ref >90)
Potassium: 3.9 mEq/L (ref 3.5–5.1)
Sodium: 141 mEq/L (ref 135–145)

## 2012-01-25 LAB — CBC
HCT: 40.4 % (ref 36.0–46.0)
Hemoglobin: 13.4 g/dL (ref 12.0–15.0)
MCHC: 33.2 g/dL (ref 30.0–36.0)
RBC: 4.48 MIL/uL (ref 3.87–5.11)
WBC: 6.9 10*3/uL (ref 4.0–10.5)

## 2012-01-25 NOTE — Discharge Summary (Signed)
Physician Discharge Summary  Heidi Marquez ZOX:[id number] DOB: November 15, 1933 DOA: 01/24/2012  PCP: Hoyle Sauer, MD  Admit date: 01/24/2012 Discharge date: 01/25/2012  Time spent: Less than 30 minutes  Recommendations for Outpatient Follow-up:  1. With Dr. Chilton Greathouse, PCP in 1 week from hospital discharge.  Discharge Diagnoses:  Principal Problem:  *Rectal bleeding Active Problems:  Orthostatic hypotension  Diverticulosis of colon with hemorrhage   Discharge Condition: Improved and stable  Diet recommendation: Heart healthy  Filed Weights   01/24/12 0652  Weight: 67.586 kg (149 lb)    History of present illness:  78 year old female with history of previous diverticular bleed 4 years ago and colonoscopy done at that time showed diverticulosis presents with complaints of having rectal bleeding. Patient has had at least 4 episodes rectal bleeding prior to arriving at the ER. Patient states at least 2 of them a large amount of blood. Denies any dizziness or weakness. In the ER patient was found to be mildly orthostatic and hemoglobin is around 13 at this time has been admitted for further management. Patient has some vague discomfort in the abdomen before each bowel movement otherwise has no abdominal pain. Denies any nausea vomiting fever chills or any recent use of antibiotics.   Hospital Course:   Bright red bleeding per rectum/LGIB  Likely diverticular or hemorrhoidal in origin.  Patient had a total of 6 bloody BM's through early hours of 01/24/12.  Has not had any BMs or rectal bleeding for almost 36 hours. Lower abdominal pain has resolved.  Hemodynamically stable.  Hemoglobin stable.  [facility] consulted-managed conservatively. Initially on clear liquids and then diet was advanced which she tolerated.  Patient not on aspirin or anticoagulants (INR: 1.21). Discussed with gastroenterology/Ms. Jennye Moccasin, GI PA advised that if patient has no  further bloody BM then patient can be discharged this evening. Patient is advised to seek immediate medical attention if she has any recurrence of same.  Mild orthostatic hypotension  Gentle IV fluids.  Likely from acute blood loss. Asymptomatic at this time.  GERD  Without significant symptoms.  Not on PPIs.  Consultants:  [facility].  Procedures:  None  Antibiotics:  None  Discharge Exam:  Complaints Denies complaints. No further BMs or rectal bleeding since 12/12 AM. No abdominal pain. Tolerating diet. No dizziness or lightheadedness. No weakness.  Filed Vitals:   01/24/12 1330 01/24/12 2008 01/25/12 0458 01/25/12 1330  BP: 123/61 131/67 147/69 128/63  Pulse: 77 67 89 86  Temp: 97.8 F (36.6 C) 98.3 F (36.8 C) 98.5 F (36.9 C) 98.7 F (37.1 C)  TempSrc: Oral Oral Oral Oral  Resp: 18 18 20 19   Height:      Weight:      SpO2: 98% 97% 97% 97%    General exam: Comfortable.  Respiratory system: Clear to auscultation. No increased work of breathing.  Cardiovascular system: First and second heart sounds heard, regular rate and rhythm. No JVD, murmurs or gallops. Telemetry shows sinus rhythm.  Gastrointestinal system: Abdomen is nondistended, soft and nontender. Normal bowel sounds heard.  Central nervous system: Alert and oriented. No focal neurological deficits.  Extremities: Symmetric 5 x 5 power.  Discharge Instructions      Discharge Orders    Future Orders Please Complete By Expires   Diet - low sodium heart healthy      Increase activity slowly      Call MD for:      Comments:   Rectal Bleeding.  Call MD for:  persistant dizziness or light-headedness      Call MD for:  extreme fatigue          Medication List     As of 01/25/2012  4:59 PM    TAKE these medications         alendronate 70 MG tablet   Commonly known as: FOSAMAX   Take 70 mg by mouth every 7 (seven) days. Tuesdays. Take with a full glass of water on an empty  stomach.         Follow-up Information    Follow up with Hoyle Sauer, MD. Schedule an appointment as soon as possible for a visit in 1 week.   Contact information:   [address] [facility] [place] [address] [phone]           The results of significant diagnostics from this hospitalization (including imaging, microbiology, ancillary and laboratory) are listed below for reference.    Significant Diagnostic Studies: No results found.  Microbiology: No results found for this or any previous visit (from the past 240 hour(s)).   Labs: Basic Metabolic Panel:  Lab 01/25/12 8295 01/24/12 0823 01/24/12 0324  NA 141 142 142  K 3.9 4.1 4.0  CL 105 106 104  CO2 26 27 27   GLUCOSE 93 101* 113*  BUN 12 16 18   CREATININE 0.82 0.81 0.94  CALCIUM 8.9 9.2 10.1  MG -- -- --  PHOS -- -- --   Liver Function Tests:  Lab 01/24/12 0823  AST 27  ALT 17  ALKPHOS 81  BILITOT 0.8  PROT 6.3  ALBUMIN 3.4*   No results found for this basename: LIPASE:5,AMYLASE:5 in the last 168 hours No results found for this basename: AMMONIA:5 in the last 168 hours CBC:  Lab 01/25/12 0910 01/24/12 1812 01/24/12 0823 01/24/12 0324  WBC 6.9 6.3 9.2 13.7*  NEUTROABS -- -- -- 11.3*  HGB 13.4 11.9* 12.0 13.6  HCT 40.4 35.7* 35.4* 40.0  MCV 90.2 89.9 89.2 89.1  PLT 173 151 158 172   Cardiac Enzymes: No results found for this basename: CKTOTAL:5,CKMB:5,CKMBINDEX:5,TROPONINI:5 in the last 168 hours BNP: BNP (last 3 results) No results found for this basename: PROBNP:3 in the last 8760 hours CBG: No results found for this basename: GLUCAP:5 in the last 168 hours   ENDOSCOPIC STUDIES:   04/2007 EGD For GER sxs and dysphagia  1) DISTAL ESOPHAGEAL STRICTURE (RING-LIKE) DILATED WITH 54 FR MALONEY  DILATOR  2) REFLUX ESOPHAGITIS  3) 2 CM SLIDING HIATAL HERNIA  4) OTHERWISE NORMAL   04/2007 Colonoscopy for hematochezia. To cecum  1) SEVERE SIGMOID  DIVERTICULOSIS AND DIFFUSE DIVERTICULOSIS OVERALL  2) INTERNAL HEMORRHOIDS  3) OTHERWISE NORMAL COLONOSCOPY TO CECUM, GOOD PREP  EITHER DIVERTICULOSIS OR HEMORRHOIDS COULD HAVE CAUSED THE BLEEDING  NO SIGNS OF THAT NOW    Signed:  Hitoshi Werts  [facility] 01/25/2012, 4:59 PM

## 2012-01-25 NOTE — Progress Notes (Signed)
     [facility] Daily Rounding Note 01/25/2012, 9:44 AM  SUBJECTIVE:       No bleeding or stools.  Not dizzy or nauseated.  Still on clears.   OBJECTIVE:         Vital signs in last 24 hours:    Temp:  [97.8 F (36.6 C)-98.5 F (36.9 C)] 98.5 F (36.9 C) (12/13 0458) Pulse Rate:  [67-89] 89  (12/13 0458) Resp:  [18-20] 20  (12/13 0458) BP: (123-147)/(61-69) 147/69 mmHg (12/13 0458) SpO2:  [97 %-98 %] 97 % (12/13 0458) Last BM Date: 01/24/12 General: looks well.  comfortable   Heart: RRR Chest: clear B.  No labored breathing Abdomen: soft, NT, ND.  No mass or HSM  Extremities: no pedal edema Neuro/Psych:  Oriented x 3.  Not anxious.   Intake/Output from previous day: 12/12 0701 - 12/13 0700 In: 1691.7 [P.O.:720; I.V.:971.7] Out: 1250 [Urine:1250]  Intake/Output this shift:    Lab Results:  Basename 01/24/12 1812 01/24/12 0823 01/24/12 0324  WBC 6.3 9.2 13.7*  HGB 11.9* 12.0 13.6  HCT 35.7* 35.4* 40.0  PLT 151 158 172   BMET  Basename 01/24/12 0823 01/24/12 0324  NA 142 142  K 4.1 4.0  CL 106 104  CO2 27 27  GLUCOSE 101* 113*  BUN 16 18  CREATININE 0.81 0.94  CALCIUM 9.2 10.1   LFT  Basename 01/24/12 0823  PROT 6.3  ALBUMIN 3.4*  AST 27  ALT 17  ALKPHOS 81  BILITOT 0.8  BILIDIR --  IBILI --   PT/INR  Basename 01/24/12 1812  LABPROT 15.1  INR 1.21   ASSESMENT: * Hematochezia, painless. Suspect diverticular bleed. Bleeding has ceased.  Hgb down from initial reading but stable in last 12 hours.  * Hx GERD and esophageal stricture. No longer using PPI. No dysphagia.     PLAN: *  Observe one more night.  If no further gross bleeding, home in AM *  D/c tele, activity ad lib.  *  CBC in AM.    LOS: 1 day   Jennye Moccasin  01/25/2012, 9:44 AM Pager: [phone]   I have personally taken an interval history, reviewed the chart, and examined the patient.  I agree with the extender's note, impression and recommendations.  Barbette Hair. Arlyce Dice, MD,  [facility] [phone]

## 2012-01-28 NOTE — Telephone Encounter (Signed)
I do not see where we have seen pt and no pending pulmonary appts -- lmomtcb on sue's named voice mail.  I haven't seen a fax regarding this either.

## 2012-01-30 NOTE — Telephone Encounter (Signed)
lmomtcb for Heidi Marquez and ask for triage nurse --- need to verify what is exactly needed.  Never received fax and haven't seen where we have seen pt in the past.

## 2012-02-01 NOTE — Telephone Encounter (Signed)
lmomtcb x2 for sue

## 2012-02-04 NOTE — Telephone Encounter (Signed)
lmtcb on Sue's named VM -- per triage will sign off.

## 2012-02-04 NOTE — Telephone Encounter (Signed)
Crystal is there anything else that needs to be done, we are not getting a return call from Fannie Knee. Carron Curie, CMA

## 2012-06-02 ENCOUNTER — Other Ambulatory Visit (HOSPITAL_COMMUNITY): Payer: Self-pay | Admitting: Obstetrics and Gynecology

## 2012-06-02 DIAGNOSIS — Z1231 Encounter for screening mammogram for malignant neoplasm of breast: Secondary | ICD-10-CM

## 2012-07-08 ENCOUNTER — Ambulatory Visit (HOSPITAL_COMMUNITY)
Admission: RE | Admit: 2012-07-08 | Discharge: 2012-07-08 | Disposition: A | Discharge status: Home / Self Care | Payer: BC Managed Care – PPO | Source: Ambulatory Visit | Attending: Obstetrics and Gynecology | Admitting: Obstetrics and Gynecology

## 2012-07-08 DIAGNOSIS — Z1231 Encounter for screening mammogram for malignant neoplasm of breast: Secondary | ICD-10-CM | POA: Insufficient documentation

## 2012-09-17 ENCOUNTER — Other Ambulatory Visit: Payer: Self-pay | Admitting: Internal Medicine

## 2012-09-17 DIAGNOSIS — E049 Nontoxic goiter, unspecified: Secondary | ICD-10-CM

## 2012-09-18 ENCOUNTER — Other Ambulatory Visit: Payer: Self-pay | Admitting: Internal Medicine

## 2012-09-19 ENCOUNTER — Ambulatory Visit
Admission: RE | Admit: 2012-09-19 | Discharge: 2012-09-19 | Disposition: A | Discharge status: Home / Self Care | Payer: BC Managed Care – PPO | Source: Ambulatory Visit | Attending: Internal Medicine | Admitting: Internal Medicine

## 2012-09-19 DIAGNOSIS — E049 Nontoxic goiter, unspecified: Secondary | ICD-10-CM

## 2012-09-23 ENCOUNTER — Other Ambulatory Visit (HOSPITAL_COMMUNITY): Payer: Self-pay | Admitting: Internal Medicine

## 2012-09-23 DIAGNOSIS — R918 Other nonspecific abnormal finding of lung field: Secondary | ICD-10-CM

## 2012-09-29 ENCOUNTER — Encounter (HOSPITAL_COMMUNITY): Payer: Medicare Other

## 2012-10-07 ENCOUNTER — Encounter (HOSPITAL_COMMUNITY)
Admission: RE | Admit: 2012-10-07 | Discharge: 2012-10-07 | Disposition: A | Discharge status: Home / Self Care | Payer: Medicare Other | Source: Ambulatory Visit | Attending: Internal Medicine | Admitting: Internal Medicine

## 2012-10-07 DIAGNOSIS — R911 Solitary pulmonary nodule: Secondary | ICD-10-CM | POA: Insufficient documentation

## 2012-10-07 DIAGNOSIS — I77810 Thoracic aortic ectasia: Secondary | ICD-10-CM | POA: Insufficient documentation

## 2012-10-07 DIAGNOSIS — I2510 Atherosclerotic heart disease of native coronary artery without angina pectoris: Secondary | ICD-10-CM | POA: Insufficient documentation

## 2012-10-07 DIAGNOSIS — K5730 Diverticulosis of large intestine without perforation or abscess without bleeding: Secondary | ICD-10-CM | POA: Insufficient documentation

## 2012-10-07 DIAGNOSIS — K8020 Calculus of gallbladder without cholecystitis without obstruction: Secondary | ICD-10-CM | POA: Insufficient documentation

## 2012-10-07 DIAGNOSIS — R918 Other nonspecific abnormal finding of lung field: Secondary | ICD-10-CM

## 2012-10-07 MED ORDER — FLUDEOXYGLUCOSE F - 18 (FDG) INJECTION
19.6000 | Freq: Once | INTRAVENOUS | Status: AC | PRN
  Administered 2012-10-07: 19.6 via INTRAVENOUS

## 2012-10-08 ENCOUNTER — Telehealth: Payer: Self-pay | Admitting: *Deleted

## 2012-10-08 NOTE — Telephone Encounter (Signed)
Spoke with pt regarding appt time and place for mtoc 10/09/12 at 3:00 arrive at 2:45.  She verbalized understanding of time and place

## 2012-10-09 ENCOUNTER — Institutional Professional Consult (permissible substitution) (INDEPENDENT_AMBULATORY_CARE_PROVIDER_SITE_OTHER): Payer: Medicare Other | Admitting: Surgery

## 2012-10-09 ENCOUNTER — Ambulatory Visit: Payer: Medicare Other | Admitting: Physical Therapy

## 2012-10-09 VITALS — BP 161/91 | HR 86 | Temp 96.8°F | Resp 18 | Ht 63.0 in | Wt 153.8 lb

## 2012-10-09 DIAGNOSIS — R911 Solitary pulmonary nodule: Secondary | ICD-10-CM

## 2012-10-11 ENCOUNTER — Encounter: Payer: Self-pay | Admitting: Surgery

## 2012-10-11 DIAGNOSIS — R911 Solitary pulmonary nodule: Secondary | ICD-10-CM | POA: Insufficient documentation

## 2012-10-11 NOTE — Progress Notes (Signed)
[address]       [place] [address]             [phone]      [facility]   PCP is Hoyle Sauer, MD Referring Provider is Avva, Serafina Royals, MD  Reason for Consultation:  Right lung nodule   HPI:  The patient is a 79 year old white female nonsmoker with a heavy passive smoking history who recently saw Dr. Felipa Eth and reported some cough and congestion. She had a CXR performed that showed a 10 x 20 mm ill-defined right upper lobe density. A CT scan on 09/19/2012 showed a 12 mm spiculated nodule in the posterior right upper lobe that was suspicious as well as an adjacent 3 mm nodule and a small cluster of 3-4 mm nodules in the anterior right upper lobe. The thyroid was enlarged and appeared multinodular. She had a PET scan performed on 10/07/2012 and the right upper lobe nodule was much less distinct and it was felt that this could be resolving or could just be a difference in the two techniques. The overall size of the lesion was unchanged. It was not hypermetabolic. The other tiny nodules were still seen and were not hypermetabolic. There was no mediastinal or hilar adenopathy. The only other abnormality noted was mild enlargement of the ascending aorta to 4.2 cm. She says she feels fine now with no cough, shortness of breath, or chest pain.  Past Medical History  Diagnosis Date  . Diverticulosis of colon     severe in sigmoid but diffusley throughout colon  on 2009 colonoscopy  . Internal hemorrhoids   . Hematochezia 2009  . Benign esophageal stricture 04/2007    EGD with dilatation.  Marland Kitchen GERD (gastroesophageal reflux disease)   . HH (hiatus hernia)   . Macular degeneration   . Arthritis   . IBS (irritable bowel syndrome)   . Epistaxis ~ 2005    required cauterization twice.     Past Surgical History  Procedure Laterality Date  . Abdominal hysterectomy      partial with the bladder tack  . Bladder tack    .  Appendectomy    . Retinal laser surgery    . Inguinal hernia repair  early 1990s    right  . Basal cell resection  08/2011, 11/2011    initially biopsy 08/3011.  repeat excision right nose with grafting from the skin behind the right ear    History reviewed. No pertinent family history.  Social History History  Substance Use Topics  . Smoking status: Passive Smoke Exposure - Never Smoker  . Smokeless tobacco: Not on file  . Alcohol Use: No    Current Outpatient Prescriptions  Medication Sig Dispense Refill  . alendronate (FOSAMAX) 70 MG tablet Take 70 mg by mouth every 7 (seven) days. Tuesdays. Take with a full glass of water on an empty stomach.       No current facility-administered medications for this visit.    Allergies  Allergen Reactions  . Penicillins Swelling  . Sulfonamide Derivatives Swelling    Review of Systems  Constitutional: Negative for fever, chills, activity change, appetite change, fatigue and unexpected weight change.  HENT: Negative.   Eyes:       Macular degeneration  Respiratory: Negative for cough, chest tightness, shortness of breath and wheezing.   Cardiovascular: Negative for chest pain and leg swelling.  Gastrointestinal:       Reflux  Endocrine: Negative.   Genitourinary: Negative.   Musculoskeletal: Positive for arthralgias.  Skin: Negative.   Allergic/Immunologic: Negative.   Neurological: Negative.   Hematological: Negative.   Psychiatric/Behavioral: Negative.     BP 161/91  Pulse 86  Temp(Src) 96.8 F (36 C)  Resp 18  Ht 5\' 3"  (1.6 m)  Wt 153 lb 12.8 oz (69.763 kg)  BMI 27.25 kg/m2 Physical Exam  Constitutional: She is oriented to person, place, and time. She appears well-developed and well-nourished. No distress.  HENT:  Head: Normocephalic and atraumatic.  Mouth/Throat: Oropharynx is clear and moist.  Eyes: Conjunctivae and EOM are normal. Pupils are equal, round, and reactive to light.  Neck: Normal range of motion.  Neck supple.  Left thyroid lobe is palpably enlarged  Cardiovascular: Normal rate, regular rhythm, normal heart sounds and intact distal pulses.   No murmur heard. Pulmonary/Chest: Effort normal and breath sounds normal. No stridor. No respiratory distress. She has no wheezes. She has no rales. She exhibits no tenderness.  Abdominal: Soft. Bowel sounds are normal. She exhibits no distension. There is no tenderness.  Musculoskeletal: Normal range of motion. She exhibits no edema.  Lymphadenopathy:    She has no cervical adenopathy.  Neurological: She is alert and oriented to person, place, and time. She has normal strength. No cranial nerve deficit or sensory deficit.  Skin: Skin is warm and dry.  Psychiatric: She has a normal mood and affect.     Diagnostic Tests:  *RADIOLOGY REPORT*   Clinical Data: Right upper lobe mass on office chest radiograph   CT CHEST WITHOUT CONTRAST   Technique:  Multidetector CT imaging of the chest was performed following the standard protocol without IV contrast.   Comparison: None.   Findings: 12 mm spiculated nodule in the posterior right upper lobe (series 4/image 23), suspicious.   Adjacent 3 mm nodule in the posterior right upper lobe (series 4/image 23).  Small cluster of 3-4 mm nodules in the anterior right upper lobe (series 4/images 26-27).   Thyroid is enlarged/multinodular (series 3/), better evaluated on ultrasound.  No lymphadenopathy.   The heart is normal in size.  No pericardial effusion.  Mild atherosclerotic calcifications of the aortic arch.   No suspicious mediastinal or axillary lymphadenopathy.   Visualized upper abdomen is notable for layering gallstones.   Degenerative changes of the visualized thoracolumbar spine.   IMPRESSION: 12 mm spiculated nodule in the posterior right upper lobe, suspicious for primary bronchogenic neoplasm.   Consider thoracic surgery consultation, percutaneous biopsy, and/or PET-CT.       Original Report Authenticated By: Charline Bills, M.D.     *RADIOLOGY REPORT*   Clinical Data: Initial treatment strategy for lung mass.   NUCLEAR MEDICINE PET SKULL BASE TO THIGH   Fasting Blood Glucose:  88   Technique:  19.6 mCi F-18 FDG was injected intravenously. CT data was obtained and used for attenuation correction and anatomic localization only.  (This was not acquired as a diagnostic CT examination.) Additional exam technical data entered on technologist worksheet.   Comparison:  Chest CT 09/19/2012.   Findings:   Neck: No hypermetabolic lymph nodes in the neck. Heterogeneously enlarged left lobe of thyroid gland which has multiple low attenuation nodules many of which contain some fine peripheral calcifications, however, none of these appear to be hypermetabolic on the PET portion of the examination.  These findings are nonspecific.   Chest:  The previously described right upper lobe nodule is far less distinct on today's examination,  which could suggest some regression of the nodule, or could simply be a reflection of differences in technique (the CT portion of today's examination is performed non breath held).  The overall size of the nodule is very similar to the prior study measuring approximate 11 mm in greatest length (image 79 of series 2).  This lesion was not hypermetabolic on the PET portion of the examination (SUVmax = 1.7).  As with the prior study several tiny nodules are again noted in the anterior aspect of the right upper lobe abutting the minor fissure.  These are also not hypermetabolic on the PET portion of the examination. No definite new nodules were otherwise identified.  No acute consolidative air space disease.  No pleural effusions. Heart size is mildly enlarged. There is no significant pericardial fluid, thickening or pericardial calcification.  Ectasia of the ascending thoracic aorta (4.2 cm in diameter).  Calcifications of the  aortic valve. There is atherosclerosis of the thoracic aorta, the great vessels of the mediastinum and the coronary arteries, including calcified atherosclerotic plaque in the left anterior descending and left circumflex coronary arteries. Small hiatal hernia.  No pathologically enlarged or hypermetabolic mediastinal or hilar lymph nodes are noted.   Abdomen/Pelvis:  No abnormal hypermetabolic activity within the liver, pancreas, adrenal glands, or spleen.  No hypermetabolic lymph nodes in the abdomen or pelvis.  Multiple small gallstones are noted layering dependently within the lumen of the gallbladder. No current findings to suggest acute cholecystitis at this time. Extensive colonic diverticulosis is noted, predominantly in the sigmoid colon, without surrounding inflammatory changes to suggest an acute diverticulitis at this time.  Normal appendix.  No significant volume of ascites.  No pneumoperitoneum.  No pathologic distension of small bowel.  Low-lying pelvic organs and laxity of the levator ani musculature, suggestive of pelvic organ prolapse.   Skeleton:  No focal hypermetabolic activity to suggest skeletal metastasis.   IMPRESSION: 1.  The previously described right upper lobe pulmonary nodule appears slightly less distinct than the prior examination, and is not hypermetabolic on today's examination.  Whether not there has truly been a regression of the nodule, or if the appearance on today's examination is simply related to artifact related to respiratory motion is uncertain.  These findings are encouraging, and may suggest a resolving infectious or inflammatory nodule. However, the possibility of a indolent neoplasm such as a low grade adenocarcinoma is not excluded.  An additional follow-up by chest CT without contrast is recommended in 3 months to confirm persistence.  If this nodule is persistent at the time of the follow-up examination, further evaluation with  biopsy should be considered. 2.  Ectasia of the ascending thoracic aorta (4.2 cm in diameter). 3.  Extensive colonic diverticulosis without findings to suggest acute diverticulitis at this time. 4.  Cholelithiasis without evidence to suggest acute cholecystitis at this time. 5. Atherosclerosis, including left anterior descending and left circumflex coronary artery disease. Assessment for potential risk factor modification, dietary therapy or pharmacologic therapy may be warranted, if clinically indicated. 6.  Low-lying pelvic organs and laxity of the levator ani musculature, suggestive of pelvic organ prolapse. 7.  Additional findings, as above.     Original Report Authenticated By: Trudie Reed, M.D.     Impression:  She has a right upper lobe nodule that looked suspicious on CT scan but is less distinct and not hypermetabolic on PET scan a few weeks later. With other small nodules present it is possible that this is all inflammatory or  infectious and may have been resolving by the time the PET was done. It is also possible that the difference in density may be technique related and this could be a small adenocarcinoma which are sometimes not hypermetabolic. I think it would be best to repeat the CT scan in 3 months to reassess this lesion and then decide if a biopsy, removal, or continued follow-up  is warranted.   Plan:  I will see her back in 3 months with a CT scan of the chest.

## 2012-12-04 ENCOUNTER — Ambulatory Visit: Payer: Medicare Other | Attending: Ophthalmology | Admitting: Occupational Therapy

## 2012-12-04 DIAGNOSIS — H53419 Scotoma involving central area, unspecified eye: Secondary | ICD-10-CM | POA: Insufficient documentation

## 2012-12-04 DIAGNOSIS — H3530 Unspecified macular degeneration: Secondary | ICD-10-CM | POA: Insufficient documentation

## 2012-12-04 DIAGNOSIS — IMO0001 Reserved for inherently not codable concepts without codable children: Secondary | ICD-10-CM | POA: Insufficient documentation

## 2012-12-11 ENCOUNTER — Other Ambulatory Visit: Payer: Self-pay

## 2012-12-11 ENCOUNTER — Telehealth: Payer: Self-pay | Admitting: Nurse Practitioner

## 2012-12-11 ENCOUNTER — Telehealth: Payer: Self-pay | Admitting: Family Medicine

## 2012-12-11 DIAGNOSIS — D381 Neoplasm of uncertain behavior of trachea, bronchus and lung: Secondary | ICD-10-CM

## 2012-12-11 NOTE — Telephone Encounter (Signed)
PT CALLED BACK AND ASK TO CANCELL REQUEST FOR APPT.  SHE WILL CALL BACK LATER. RS

## 2012-12-12 ENCOUNTER — Encounter: Payer: Medicare Other | Admitting: Occupational Therapy

## 2012-12-31 ENCOUNTER — Ambulatory Visit
Admission: RE | Admit: 2012-12-31 | Discharge: 2012-12-31 | Disposition: A | Discharge status: Home / Self Care | Payer: Medicare Other | Source: Ambulatory Visit | Attending: Surgery | Admitting: Surgery

## 2012-12-31 ENCOUNTER — Ambulatory Visit (INDEPENDENT_AMBULATORY_CARE_PROVIDER_SITE_OTHER): Payer: Medicare Other | Admitting: Surgery

## 2012-12-31 ENCOUNTER — Encounter: Payer: Self-pay | Admitting: Surgery

## 2012-12-31 VITALS — BP 158/87 | HR 70 | Resp 16 | Ht 63.5 in | Wt 150.0 lb

## 2012-12-31 DIAGNOSIS — D381 Neoplasm of uncertain behavior of trachea, bronchus and lung: Secondary | ICD-10-CM

## 2012-12-31 NOTE — Progress Notes (Signed)
HPI:  The patient is a 79 year old white female nonsmoker with a heavy passive smoking history who recently saw Dr. Felipa Eth and reported some cough and congestion. She had a CXR performed that showed a 10 x 20 mm ill-defined right upper lobe density. A CT scan on 09/19/2012 showed a 12 mm spiculated nodule in the posterior right upper lobe that was suspicious as well as an adjacent 3 mm nodule and a small cluster of 3-4 mm nodules in the anterior right upper lobe. The thyroid was enlarged and appeared multinodular. She had a PET scan performed on 10/07/2012 and the right upper lobe nodule was much less distinct and it was felt that this could be resolving or could just be a difference in the two techniques. The overall size of the lesion was unchanged. It was not hypermetabolic. The other tiny nodules were still seen and were not hypermetabolic. There was no mediastinal or hilar adenopathy. The only other abnormality noted was mild enlargement of the ascending aorta to 4.2 cm. She says she feels fine now with no cough, shortness of breath, or chest pain. When I saw her in August of this year the right upper lobe nodule that looked suspicious on CT scan but was less distinct and not hypermetabolic on PET scan a few weeks later. With other small nodules present it is possible that this is all inflammatory or infectious and may have been resolving by the time the PET was done. It is also possible that the difference in density may be technique related and this could be a small adenocarcinoma which are sometimes not hypermetabolic. I thought it would be best to repeat the CT scan in 3 months to reassess this lesion.   Current Outpatient Prescriptions  Medication Sig Dispense Refill  . alendronate (FOSAMAX) 70 MG tablet Take 70 mg by mouth every 7 (seven) days. Tuesdays. Take with a full glass of water on an empty stomach.       No current facility-administered medications for this visit.     Physical  Exam: BP 158/87  Pulse 70  Resp 16  Ht 5' 3.5" (1.613 m)  Wt 150 lb (68.04 kg)  BMI 26.15 kg/m2  SpO2 96% Neck: Normal range of motion. Neck supple. There is no cervical or supraclavicular adenopathy. Left thyroid lobe is palpably enlarged  Cardiovascular: Normal rate, regular rhythm, normal heart sounds and intact distal pulses.  No murmur heard.  Pulmonary/Chest: Effort normal and breath sounds normal. No stridor. No respiratory distress. She has no wheezes. She has no rales. She exhibits no tenderness.    Diagnostic Tests:  CLINICAL DATA:  Follow-up lung nodules   EXAM: CT CHEST WITHOUT CONTRAST   TECHNIQUE: Multidetector CT imaging of the chest was performed following the standard protocol without IV contrast.   COMPARISON:  09/19/2012 CT scan and 10/07/2012 CT scan   FINDINGS: There is a small hiatal hernia. There is a lower pole left thyroid mass measuring about 4 cm. This is heterogeneous with cystic areas and calcification and it appears similar from the prior study. There is a spiculated nodule inferiorly in the right upper lobe. Today it measures 14 x 9 x 12 mm. Previously it measured 13 x 11 x 12 mm. There is a tiny 2 mm nodule immediately anterior to this. There are no other significant parenchymal opacities. There are no pleural effusions. Scans through the upper abdomen are within normal limits. There are no acute musculoskeletal findings.  IMPRESSION: Very minimal interval decrease in size of a spiculated right upper lobe nodule. The appearance is concerning for malignancy but it is noted that the lesion was not hypermetabolic on PET scan. Biopsy should be considered, but if not felt clinically appropriate, in light of the minimal interval decrease in size, a follow-up CT scan in 3 months would be recommended.     Electronically Signed   By: Esperanza Heir M.D.   On: 12/31/2012 13:51     Impression:  The right upper lobe nodule is slightly  smaller and was not hypermetabolic on PET scan. Given her age I think a conservative approach is best and I would recommend repeating the CT scan of the chest in 3 months.  Plan:  I will see her back in 3 months with a CT chest.

## 2013-01-02 ENCOUNTER — Encounter: Payer: Self-pay | Admitting: *Deleted

## 2013-03-17 ENCOUNTER — Other Ambulatory Visit: Payer: Self-pay | Admitting: *Deleted

## 2013-03-17 DIAGNOSIS — D381 Neoplasm of uncertain behavior of trachea, bronchus and lung: Secondary | ICD-10-CM

## 2013-04-01 ENCOUNTER — Encounter: Payer: Self-pay | Admitting: Surgery

## 2013-04-01 ENCOUNTER — Ambulatory Visit (INDEPENDENT_AMBULATORY_CARE_PROVIDER_SITE_OTHER): Payer: Commercial Managed Care - HMO | Admitting: Surgery

## 2013-04-01 ENCOUNTER — Ambulatory Visit
Admission: RE | Admit: 2013-04-01 | Discharge: 2013-04-01 | Disposition: A | Discharge status: Home / Self Care | Payer: Medicare (Managed Care) | Source: Ambulatory Visit | Attending: Surgery | Admitting: Surgery

## 2013-04-01 VITALS — BP 140/88 | HR 66 | Resp 20 | Ht 63.5 in | Wt 154.0 lb

## 2013-04-01 DIAGNOSIS — D381 Neoplasm of uncertain behavior of trachea, bronchus and lung: Secondary | ICD-10-CM

## 2013-04-01 DIAGNOSIS — R911 Solitary pulmonary nodule: Secondary | ICD-10-CM

## 2013-04-01 NOTE — Progress Notes (Signed)
HPI:  She returns for follow-up of a 14 x 9 x 12 mm spiculated nodule in the right upper lobe of the lung. I saw her on 12/31/2012 and a CT scan at that time showed very slight decrease in the size of the nodule compared to CT scan in 09/2012. She has felt fine with no cough or sputum production, no hemoptysis. She denies fever. Appetite has been good and she denies weight loss.  Current Outpatient Prescriptions  Medication Sig Dispense Refill  . alendronate (FOSAMAX) 70 MG tablet Take 70 mg by mouth every 7 (seven) days. Tuesdays. Take with a full glass of water on an empty stomach.      . Ascorbic Acid (VITAMIN C) 1000 MG tablet Take 1,000 mg by mouth daily.      . Biotin 1000 MCG tablet Take 1,000 mcg by mouth 3 (three) times daily.      Marland Kitchen CALCIUM & MAGNESIUM CARBONATES PO Take 1 tablet by mouth daily.      Marland Kitchen glucosamine-chondroitin 500-400 MG tablet Take 1 tablet by mouth 3 (three) times daily.      Marland Kitchen loratadine (CLARITIN) 10 MG tablet Take 10 mg by mouth daily.      . Multiple Vitamin (MULTIVITAMIN) tablet Take 1 tablet by mouth daily.      . Multiple Vitamins-Minerals (ICAPS MV PO) Take 1 capsule by mouth daily.      . naproxen sodium (ANAPROX) 220 MG tablet Take 220 mg by mouth 2 (two) times daily with a meal.      . vitamin B-12 (CYANOCOBALAMIN) 1000 MCG tablet Take 6,000 mcg by mouth daily.       No current facility-administered medications for this visit.     Physical Exam: BP 140/88  Pulse 66  Resp 20  Ht 5' 3.5" (1.613 m)  Wt 154 lb (69.854 kg)  BMI 26.85 kg/m2  SpO2 96% She looks well Lungs are clear There is no cervical or supraclavicular adenopathy.  Diagnostic Tests:  CLINICAL DATA: Three month follow-up examination of the chest for a  known PET negative right upper lobe spiculated nodule  EXAM:  CT CHEST WITHOUT CONTRAST  TECHNIQUE:  Multidetector CT imaging of the chest was performed following the  standard protocol without IV contrast.    COMPARISON: CT CHEST W/O CM dated 12/31/2012  FINDINGS:  On images 24 and 25 of series 4 there is a stable appearing  semi-solid nodule. It measures 13 x 12 x 9 mm. On the most recent CT  scan it measured 14 x 9 x 12 mm. On image 28 there is a stable 4 mm  diameter ground-glass density in the anterior aspect of the right  upper lobe. An adjacent 1-2 mm diameter nodule on image 27 is  demonstrated and appears stable. No nodules are demonstrated  elsewhere on the right nor within the left lung. There is stable  scarring in the lingula. There is no alveolar infiltrate.  At mediastinal window settings again demonstrated is enlargement of  the left thyroid lobe with a multinodular appearance the cardiac  chambers are top-normal in size. The caliber of the thoracic aorta  is normal. There is no pleural nor pericardial effusion. No bulky  mediastinal or hilar lymph nodes are demonstrated. The thoracic  esophagus is normal in caliber.  Within the upper abdomen calcified gallstones are noted in the  dependent portion of the partially imaged gallbladder. The  visualized portions of the liver and spleen and adrenal  glands  appear normal.  IMPRESSION:  1. The nodule in the inferior aspect of the right upper lobe has  decreased slightly in size since November. No new lesions are  demonstrated.  2. There is no evidence of acute cardiopulmonary abnormality.  3. There is stable enlargement of the left thyroid lobe consistent  with a multinodular goiter.  4. There are gallstones layering in the dependent portion of the  gallbladder.  5. An additional follow-up noncontrast CT scan of the chest in 4-6  months is recommended.  Electronically Signed  By: David Martinique  On: 04/01/2013 12:43    Impression:  The right upper lobe nodule is slightly smaller since November. This is a suspicious-appearing nodule so I think continued follow-up is indicated.  Plan:  She will return in 4 months with CT  scan of the chest.

## 2013-05-06 ENCOUNTER — Ambulatory Visit: Payer: Commercial Managed Care - HMO | Admitting: Surgery

## 2013-06-10 ENCOUNTER — Other Ambulatory Visit: Payer: Self-pay | Admitting: *Deleted

## 2013-06-10 DIAGNOSIS — R911 Solitary pulmonary nodule: Secondary | ICD-10-CM

## 2013-06-11 ENCOUNTER — Other Ambulatory Visit: Payer: Self-pay | Admitting: *Deleted

## 2013-06-11 DIAGNOSIS — R911 Solitary pulmonary nodule: Secondary | ICD-10-CM

## 2013-06-22 ENCOUNTER — Other Ambulatory Visit (HOSPITAL_COMMUNITY): Payer: Self-pay | Admitting: Obstetrics and Gynecology

## 2013-06-22 DIAGNOSIS — Z1231 Encounter for screening mammogram for malignant neoplasm of breast: Secondary | ICD-10-CM

## 2013-07-21 ENCOUNTER — Ambulatory Visit (HOSPITAL_COMMUNITY)
Admission: RE | Admit: 2013-07-21 | Discharge: 2013-07-21 | Disposition: A | Discharge status: Home / Self Care | Payer: Medicare HMO | Source: Ambulatory Visit | Attending: Obstetrics and Gynecology | Admitting: Obstetrics and Gynecology

## 2013-07-21 DIAGNOSIS — Z1231 Encounter for screening mammogram for malignant neoplasm of breast: Secondary | ICD-10-CM | POA: Insufficient documentation

## 2013-08-05 ENCOUNTER — Other Ambulatory Visit: Payer: Commercial Managed Care - HMO

## 2013-08-05 ENCOUNTER — Ambulatory Visit: Payer: Commercial Managed Care - HMO | Admitting: Surgery

## 2013-08-07 ENCOUNTER — Ambulatory Visit
Admission: RE | Admit: 2013-08-07 | Discharge: 2013-08-07 | Disposition: A | Discharge status: Home / Self Care | Payer: Commercial Managed Care - HMO | Source: Ambulatory Visit | Attending: Surgery | Admitting: Surgery

## 2013-08-07 ENCOUNTER — Encounter: Payer: Self-pay | Admitting: Surgery

## 2013-08-07 ENCOUNTER — Ambulatory Visit (INDEPENDENT_AMBULATORY_CARE_PROVIDER_SITE_OTHER): Payer: Commercial Managed Care - HMO | Admitting: Surgery

## 2013-08-07 VITALS — BP 133/86 | HR 83 | Resp 16 | Ht 63.5 in | Wt 151.0 lb

## 2013-08-07 DIAGNOSIS — R911 Solitary pulmonary nodule: Secondary | ICD-10-CM

## 2013-08-07 DIAGNOSIS — D381 Neoplasm of uncertain behavior of trachea, bronchus and lung: Secondary | ICD-10-CM

## 2013-08-07 NOTE — Progress Notes (Signed)
HPI:  She returns for follow-up of a 14 x 9 x 12 mm spiculated nodule in the right upper lobe of the lung. I saw her on 12/31/2012 and a CT scan at that time showed very slight decrease in the size of the nodule compared to CT scan in 09/2012. When I saw her on 04/01/2013 the nodule was stable in size. She had a PET scan on 10/07/2012 that showed no hypermetabolic activity within the lesion. She has felt fine with no cough or sputum production, no hemoptysis. She denies fever. Appetite has been good and she denies weight loss.   Current Outpatient Prescriptions  Medication Sig Dispense Refill  . alendronate (FOSAMAX) 70 MG tablet Take 70 mg by mouth every 7 (seven) days. Tuesdays. Take with a full glass of water on an empty stomach.      . Ascorbic Acid (VITAMIN C) 1000 MG tablet Take 1,000 mg by mouth daily.      . Biotin 1000 MCG tablet Take 1,000 mcg by mouth 3 (three) times daily.      Marland Kitchen CALCIUM & MAGNESIUM CARBONATES PO Take 1 tablet by mouth daily.      . cholecalciferol (VITAMIN D) 1000 UNITS tablet Take 1,000 Units by mouth daily.      Marland Kitchen glucosamine-chondroitin 500-400 MG tablet Take 1 tablet by mouth 3 (three) times daily.      Marland Kitchen loratadine (CLARITIN) 10 MG tablet Take 10 mg by mouth daily.      . Multiple Vitamin (MULTIVITAMIN) tablet Take 1 tablet by mouth daily.      . Multiple Vitamins-Minerals (ICAPS MV PO) Take 1 capsule by mouth daily.      . naproxen sodium (ANAPROX) 220 MG tablet Take 220 mg by mouth 2 (two) times daily with a meal.      . vitamin B-12 (CYANOCOBALAMIN) 1000 MCG tablet Take 6,000 mcg by mouth daily.       No current facility-administered medications for this visit.     Physical Exam: BP 133/86  Pulse 83  Resp 16  Ht 5' 3.5" (1.613 m)  Wt 151 lb (68.493 kg)  BMI 26.33 kg/m2  SpO2 94% She looks well  Lungs are clear  There is no cervical or supraclavicular adenopathy. The left thyroid lobe is slighyly prominent.   Diagnostic Tests:  CLINICAL  DATA: Followup lung nodule identified 09/19/2012, no  history of cancer  EXAM:  CT CHEST WITHOUT CONTRAST  TECHNIQUE:  Multidetector CT imaging of the chest was performed following the  standard protocol without IV contrast.  COMPARISON: 04/01/2013, 12/1912, 09/19/2012  FINDINGS:  Again identified is a 12 x 9 x 10 mm nodule with irregular mildly  spiculated borders and semi solid appearance in the right upper  lobe, with its base along the minor fissure. It is unchanged in size  or appearance when compared to 04/01/2013. Two tiny right middle  lobe nodule seen on images 27 and 28 are stable as well, the larger  at about 33mm and appearing sub-solid. The left lung remains clear.  An irregular multinodular heterogeneous left thyroid is unchanged in  appearance. No hilar or mediastinal adenopathy. No acute  musculoskeletal findings. Demonstrate numerous tiny gallstones. No  pleural or pericardial effusion.  IMPRESSION:  Stable 12 x 9 x 10 mm right lung mass when compared to 04/01/13  and8/8/14, documenting stability for approximately 11 months, with  two much smaller also stable nodules. Although biopsy would be a  reasonable consideration, patient condition  and stability may  suggest that conservative imaging followup may be appropriate for  this patient. As such, consider follow up CT in approximately six  months.  Electronically Signed  By: Skipper Cliche M.D.  On: 08/07/2013 11:34   Impression:  The right upper lobe lung nodule is unchanged for about 11 months but has not resolved and does have some spiculation and is semisolid. It could be an inflammatory lesion or could be a slowly growing adenocarcinoma. I discussed the options of performing a CT-guided needle biopsy or continuing to follow this lesion with a CT scan in 6 months. I think a biopsy is reasonable and if it is a cancer it may give [place] a diagnosis sooner and allow treatment sooner. Even if the biopsy was negative or  indeterminate I would follow this lesion for a while to be sure it is stable given its appearance.  Plan:  She would like to think about this for a while and discuss it with her daughter. She will let me know if she wants to proceed with a biopsy. I will plan to see her in 6 months with a repeat CT of the chest if she does not want to do a biopsy now.

## 2013-10-15 ENCOUNTER — Encounter: Payer: Self-pay | Admitting: Internal Medicine

## 2013-12-17 ENCOUNTER — Other Ambulatory Visit: Payer: Self-pay | Admitting: *Deleted

## 2013-12-17 DIAGNOSIS — R911 Solitary pulmonary nodule: Secondary | ICD-10-CM

## 2014-01-28 ENCOUNTER — Encounter: Payer: Self-pay | Admitting: *Deleted

## 2014-02-03 ENCOUNTER — Ambulatory Visit: Payer: Commercial Managed Care - HMO | Admitting: Nurse Practitioner

## 2014-02-10 ENCOUNTER — Ambulatory Visit
Admission: RE | Admit: 2014-02-10 | Discharge: 2014-02-10 | Disposition: A | Discharge status: Home / Self Care | Payer: Commercial Managed Care - HMO | Source: Ambulatory Visit | Attending: Surgery | Admitting: Surgery

## 2014-02-10 ENCOUNTER — Ambulatory Visit (INDEPENDENT_AMBULATORY_CARE_PROVIDER_SITE_OTHER): Payer: Commercial Managed Care - HMO | Admitting: Surgery

## 2014-02-10 ENCOUNTER — Encounter: Payer: Self-pay | Admitting: Surgery

## 2014-02-10 VITALS — BP 160/86 | HR 76 | Resp 20 | Ht 63.5 in | Wt 154.0 lb

## 2014-02-10 DIAGNOSIS — D381 Neoplasm of uncertain behavior of trachea, bronchus and lung: Secondary | ICD-10-CM

## 2014-02-10 DIAGNOSIS — R911 Solitary pulmonary nodule: Secondary | ICD-10-CM

## 2014-02-10 NOTE — Progress Notes (Signed)
HPI:  She returns for follow-up of a 14 x 9 x 12 mm spiculated nodule in the right upper lobe of the lung. I saw her on 12/31/2012 and a CT scan at that time showed very slight decrease in the size of the nodule compared to CT scan in 09/2012. When I saw her on 04/01/2013 the nodule was stable in size. She had a PET scan on 10/07/2012 that showed no hypermetabolic activity within the lesion. I last saw her on 08/07/2013 and the right lung nodule was stable in size at 12 x 9 x 10 mm. She has felt fine with no cough or sputum production, no hemoptysis. She denies fever. Appetite has been good and she denies weight loss.  Current Outpatient Prescriptions  Medication Sig Dispense Refill  . alendronate (FOSAMAX) 70 MG tablet Take 70 mg by mouth every 7 (seven) days. Tuesdays. Take with a full glass of water on an empty stomach.    . Ascorbic Acid (VITAMIN C) 1000 MG tablet Take 1,000 mg by mouth daily.    . Biotin 1000 MCG tablet Take 1,000 mcg by mouth 3 (three) times daily.    Marland Kitchen CALCIUM & MAGNESIUM CARBONATES PO Take 1 tablet by mouth daily.    . cholecalciferol (VITAMIN D) 1000 UNITS tablet Take 1,000 Units by mouth daily.    Marland Kitchen glucosamine-chondroitin 500-400 MG tablet Take 1 tablet by mouth daily.     Marland Kitchen loratadine (CLARITIN) 10 MG tablet Take 10 mg by mouth daily.    . Multiple Vitamin (MULTIVITAMIN) tablet Take 1 tablet by mouth daily.    . Multiple Vitamins-Minerals (ICAPS MV PO) Take 1 capsule by mouth daily.    . naproxen sodium (ANAPROX) 220 MG tablet Take 220 mg by mouth daily.     . vitamin B-12 (CYANOCOBALAMIN) 1000 MCG tablet Take 6,000 mcg by mouth daily.     No current facility-administered medications for this visit.     Physical Exam:  BP 160/86 mmHg  Pulse 76  Resp 20  Ht 5' 3.5" (1.613 m)  Wt 154 lb (69.854 kg)  BMI 26.85 kg/m2  SpO2 96% She looks well  Lungs are clear  There is no cervical or supraclavicular adenopathy. The left thyroid lobe is slighyly  prominent.   Diagnostic Tests:  CLINICAL DATA: Right lung nodule.  EXAM: CT CHEST WITHOUT CONTRAST  TECHNIQUE: Multidetector CT imaging of the chest was performed following the standard protocol without IV contrast.  COMPARISON: CT scan of August 07, 2013.  FINDINGS: No pneumothorax or pleural effusion is noted. Left lung is clear. 11 x 8 mm nodular density is noted posteriorly in the right upper lobe best seen on image number 24 series 4. This is not significantly changed compared to prior exam. Large left thyroid nodule or mass is noted. Thyroid ultrasound is recommended for further evaluation.  No mediastinal mass or adenopathy is noted on these unenhanced images. Small gallstones are noted in the visualized portion of the upper abdomen. No significant osseous abnormality is noted in the chest.  IMPRESSION: Large left thyroid nodule or mass is noted. Thyroid ultrasound is recommended for further evaluation.  11.8 mm nodular density is noted posteriorly in the right upper lobe which is not significantly changed compared to prior exam. Followup CT scan in 6 months is again recommended to ensure stability of this abnormality and rule out malignancy.   Electronically Signed  By: Sabino Dick M.D.  On: 02/10/2014 12:18  Impression:  The right  upper lobe lung nodule is unchanged since 09/2012 but has not resolved and does have some spiculation and is semisolid. It could be an inflammatory lesion or could be a slowly growing adenocarcinoma. I think it would be best to continue following this for now. It was negative on PET scan in 09/2012. She also has a large multinodular thyroid goiter that has been followed by Dr. Dagmar Hait. This looks unchanged to me on CT scan.  Plan:  She will return in 6 months with a CT scan of the chest.

## 2014-04-06 ENCOUNTER — Encounter: Payer: Self-pay | Admitting: Internal Medicine

## 2014-04-06 ENCOUNTER — Ambulatory Visit (INDEPENDENT_AMBULATORY_CARE_PROVIDER_SITE_OTHER): Payer: Commercial Managed Care - HMO | Admitting: Internal Medicine

## 2014-04-06 VITALS — BP 162/100 | HR 56 | Resp 14 | Ht 63.0 in | Wt 151.8 lb

## 2014-04-06 DIAGNOSIS — R194 Change in bowel habit: Secondary | ICD-10-CM

## 2014-04-06 DIAGNOSIS — K644 Residual hemorrhoidal skin tags: Secondary | ICD-10-CM

## 2014-04-06 DIAGNOSIS — K648 Other hemorrhoids: Secondary | ICD-10-CM

## 2014-04-06 DIAGNOSIS — R195 Other fecal abnormalities: Secondary | ICD-10-CM

## 2014-04-06 DIAGNOSIS — M6289 Other specified disorders of muscle: Secondary | ICD-10-CM

## 2014-04-06 DIAGNOSIS — N816 Rectocele: Secondary | ICD-10-CM

## 2014-04-06 DIAGNOSIS — N8184 Pelvic muscle wasting: Secondary | ICD-10-CM

## 2014-04-06 NOTE — Progress Notes (Signed)
  Referred by Dr. Dagmar Hait Subjective:    Patient ID: Heidi Marquez, female    DOB: December 23, 1933, 81 y.o.   MRN: [id number] Chief Complaint is heme positive stool HPI Patient is here to request of Dr. Dagmar Hait because she had 3 of 3 Hemoccult cards positive. I have seen her in the past, a screening colonoscopy showed diverticulosis and hemorrhoids. She also had a stricture dilation of the esophagus at that same time in 2009 with reflux esophagitis and a sliding hiatal hernia. She is not complaining of bleeding but she does struggle with increasing stools. She notices that every time she sits on the commode even to urinate should have a small amount of stool. It's hard to cleanse. She does not notice any bulging or perfusion from the rectum. He I review of systems is otherwise negative.  Medications, allergies, past medical history, past surgical history, family history and social history are reviewed and updated in the EMR.  Review of Systems As per history of present illness, all other review of systems negative.    Objective:   Physical Exam  BP 162/100 mmHg  Pulse 56  Resp 14  Ht 5\' 3"  (1.6 m)  Wt 151 lb 12.8 oz (68.856 kg)  BMI 26.90 kg/m2  General:  Well-developed, well-nourished and in no acute distress Eyes:  anicteric. ENT:   Mouth and posterior pharynx free of lesions.  Neck:   supple w/o thyromegaly or mass.  Lungs: Clear to auscultation bilaterally. Heart:  S1S2, no rubs, murmurs, gallops. Abdomen:  soft, non-tender, no hepatosplenomegaly, hernia, or mass and BS+.  Rectal:  Patti [place], [place] present.   Anoderm inspection revealed large tags RA, LL and associated prolapsed int hemorrhoids, some stool  Digital exam revealed decreasedresting tone and voluntary squeeze.  + rectocele present.  Simulated defecation with valsalva revealed appropriate abdominal contraction and paradoxical anal contraction and no sigdescent.    Lymph:  no cervical or supraclavicular  adenopathy. Extremities:   no edema Skin   no rash. Neuro:  A&O x 3.  Psych:  appropriate mood and  Affect.   Data Reviewed: Hemoccult testing results     Assessment & Plan:  Heme positive stool  Change in bowel habits  Internal hemorrhoids with complication - Gr 3 prolapse  Anal skin tag  Rectocele  Pelvic floor dysfunction suspected   1. Colonoscopy to evaluate these problems. The risks and benefits as well as alternatives of endoscopic procedure(s) have been discussed and reviewed. All questions answered. The patient agrees to proceed. 2. Benefiber 3. Align daily (gas) 4. Eventual banding of hemorrhoids likely - discussed with her today. i think this is a large part of anal hygiene, frequent defecation problem. 5. May need to consider PT referral for suspected pelvic floor dysfunction. 6. Rectocele repair is a consideration but try conservative measures first.  I appreciate the opportunity to care for her.  BT:DVVO,[id number] R, MD

## 2014-04-06 NOTE — Patient Instructions (Addendum)
You have been scheduled for a colonoscopy. Please follow written instructions given to you at your visit today.  Please pick up your prep supplies at the pharmacy. If you use inhalers (even only as needed), please bring them with you on the day of your procedure.  Today you have been given Hemorrhoid banding information to read over.  Today you have been given a handout to read and follow on benefiber.   Dr. Carlean Purl would like for you to purchase over the counter Align to take daily to put the good bacteria back into your colon.   I appreciate the opportunity to care for you.

## 2014-04-07 DIAGNOSIS — K648 Other hemorrhoids: Secondary | ICD-10-CM | POA: Insufficient documentation

## 2014-04-07 DIAGNOSIS — M6289 Other specified disorders of muscle: Secondary | ICD-10-CM | POA: Insufficient documentation

## 2014-04-07 DIAGNOSIS — N816 Rectocele: Secondary | ICD-10-CM | POA: Insufficient documentation

## 2014-04-09 ENCOUNTER — Other Ambulatory Visit: Payer: Self-pay

## 2014-04-09 ENCOUNTER — Other Ambulatory Visit (INDEPENDENT_AMBULATORY_CARE_PROVIDER_SITE_OTHER): Payer: Commercial Managed Care - HMO

## 2014-04-09 ENCOUNTER — Encounter: Payer: Self-pay | Admitting: Internal Medicine

## 2014-04-09 ENCOUNTER — Ambulatory Visit (AMBULATORY_SURGERY_CENTER): Payer: Commercial Managed Care - HMO | Admitting: Internal Medicine

## 2014-04-09 ENCOUNTER — Telehealth: Payer: Self-pay

## 2014-04-09 VITALS — BP 122/52 | HR 56 | Temp 97.0°F | Resp 26 | Ht 63.0 in | Wt 151.0 lb

## 2014-04-09 DIAGNOSIS — R195 Other fecal abnormalities: Secondary | ICD-10-CM

## 2014-04-09 DIAGNOSIS — K648 Other hemorrhoids: Secondary | ICD-10-CM

## 2014-04-09 DIAGNOSIS — C187 Malignant neoplasm of sigmoid colon: Secondary | ICD-10-CM

## 2014-04-09 DIAGNOSIS — D125 Benign neoplasm of sigmoid colon: Secondary | ICD-10-CM

## 2014-04-09 DIAGNOSIS — K5731 Diverticulosis of large intestine without perforation or abscess with bleeding: Secondary | ICD-10-CM

## 2014-04-09 LAB — COMPREHENSIVE METABOLIC PANEL
ALT: 14 U/L (ref 0–35)
AST: 22 U/L (ref 0–37)
Albumin: 4.1 g/dL (ref 3.5–5.2)
Alkaline Phosphatase: 98 U/L (ref 39–117)
BILIRUBIN TOTAL: 0.7 mg/dL (ref 0.2–1.2)
BUN: 15 mg/dL (ref 6–23)
CO2: 32 mEq/L (ref 19–32)
CREATININE: 0.92 mg/dL (ref 0.40–1.20)
Calcium: 9 mg/dL (ref 8.4–10.5)
Chloride: 105 mEq/L (ref 96–112)
GFR: 62.66 mL/min (ref >60.00)
Glucose, Bld: 93 mg/dL (ref 70–99)
Potassium: 4.1 mEq/L (ref 3.5–5.1)
Sodium: 140 mEq/L (ref 135–145)
Total Protein: 6.7 g/dL (ref 6.0–8.3)

## 2014-04-09 LAB — CBC WITH DIFFERENTIAL/PLATELET
Basophils Absolute: 0 10*3/uL (ref 0.0–0.1)
Basophils Relative: 0.6 % (ref 0.0–3.0)
Eosinophils Absolute: 0.1 10*3/uL (ref 0.0–0.7)
Eosinophils Relative: 2.2 % (ref 0.0–5.0)
HCT: 36.4 % (ref 36.0–46.0)
Hemoglobin: 12.4 g/dL (ref 12.0–15.0)
Lymphocytes Relative: 18.1 % (ref 12.0–46.0)
Lymphs Abs: 1.1 10*3/uL (ref 0.7–4.0)
MCHC: 34 g/dL (ref 30.0–36.0)
MCV: 87.8 fl (ref 78.0–100.0)
MONO ABS: 0.5 10*3/uL (ref 0.1–1.0)
Monocytes Relative: 7.6 % (ref 3.0–12.0)
NEUTROS ABS: 4.4 10*3/uL (ref 1.4–7.7)
Neutrophils Relative %: 71.5 % (ref 43.0–77.0)
Platelets: 187 10*3/uL (ref 150.0–400.0)
RBC: 4.14 Mil/uL (ref 3.87–5.11)
RDW: 13.9 % (ref 11.5–15.5)
WBC: 6.2 10*3/uL (ref 4.0–10.5)

## 2014-04-09 MED ORDER — SODIUM CHLORIDE 0.9 % IV SOLN: 500.0000 mL | INTRAVENOUS | Status: DC

## 2014-04-09 NOTE — Patient Instructions (Addendum)
There was a large polypoid mass in the sigmoid colon. I am concerned this is a cancer. Whatever it is is too big to be removed by me. It will require surgery, I think.  You will have some labs today and my office will arrange for some CT scans also. An appointment with a surgeon will be made also.  We will let you know the biopsy results next week most likely. I am going to be away so there could be some delay. The biopsies might not sshow cancer but the size of the lesion suggest to me that is what it is so we may not really know until it is removed completely.  I appreciate the opportunity to care for you. Gatha Mayer, MD, FACG  YOU HAD AN ENDOSCOPIC PROCEDURE TODAY AT [facility]: Refer to the procedure report that was given to you for any specific questions about what was found during the examination.  If the procedure report does not answer your questions, please call your gastroenterologist to clarify.  If you requested that your care partner not be given the details of your procedure findings, then the procedure report has been included in a sealed envelope for you to review at your convenience later.  YOU SHOULD EXPECT: Some feelings of bloating in the abdomen. Passage of more gas than usual.  Walking can help get rid of the air that was put into your GI tract during the procedure and reduce the bloating. If you had a lower endoscopy (such as a colonoscopy or flexible sigmoidoscopy) you may notice spotting of blood in your stool or on the toilet paper. If you underwent a bowel prep for your procedure, then you may not have a normal bowel movement for a few days.  DIET: Your first meal following the procedure should be a light meal and then it is ok to progress to your normal diet.  A half-sandwich or bowl of soup is an example of a good first meal.  Heavy or fried foods are harder to digest and may make you feel nauseous or bloated.  Likewise meals heavy in dairy  and vegetables can cause extra gas to form and this can also increase the bloating.  Drink plenty of fluids but you should avoid alcoholic beverages for 24 hours.  ACTIVITY: Your care partner should take you home directly after the procedure.  You should plan to take it easy, moving slowly for the rest of the day.  You can resume normal activity the day after the procedure however you should NOT DRIVE or use heavy machinery for 24 hours (because of the sedation medicines used during the test).    SYMPTOMS TO REPORT IMMEDIATELY: A gastroenterologist can be reached at any hour.  During normal business hours, 8:30 AM to 5:00 PM Monday through Friday, call [phone].  After hours and on weekends, please call the GI answering service at [phone] who will take a message and have the physician on call contact you.   Following lower endoscopy (colonoscopy or flexible sigmoidoscopy):  Excessive amounts of blood in the stool  Significant tenderness or worsening of abdominal pains  Swelling of the abdomen that is new, acute  Fever of 100F or higher  Following upper endoscopy (EGD)  Vomiting of blood or coffee ground material  New chest pain or pain under the shoulder blades  Painful or persistently difficult swallowing  New shortness of breath  Fever of 100F or higher  Black, tarry-looking  stools  FOLLOW UP: If any biopsies were taken you will be contacted by phone or by letter within the next 1-3 weeks.  Call your gastroenterologist if you have not heard about the biopsies in 3 weeks.  Our staff will call the home number listed on your records the next business day following your procedure to check on you and address any questions or concerns that you may have at that time regarding the information given to you following your procedure. This is a courtesy call and so if there is no answer at the home number and we have not heard from you through the emergency physician on call, we will  assume that you have returned to your regular daily activities without incident.  SIGNATURES/CONFIDENTIALITY: You and/or your care partner have signed paperwork which will be entered into your electronic medical record.  These signatures attest to the fact that that the information above on your After Visit Summary has been reviewed and is understood.  Full responsibility of the confidentiality of this discharge information lies with you and/or your care-partner.  Diverticulosis and hemorrhoid information given.  Labs to be drawn before leaving today.

## 2014-04-09 NOTE — Progress Notes (Signed)
CT contrast given.  Advised pt. That she will be contacted by Dr. Celesta Aver office about scheduling CT scan and app. With a Psychologist, sport and exercise.

## 2014-04-09 NOTE — Op Note (Signed)
[place]  [address] [place], [address]   COLONOSCOPY PROCEDURE REPORT  PATIENT: Heidi Marquez, Heidi Marquez  MR#: [id number] BIRTHDATE: May 19, 1933 , 80  yrs. old GENDER: female ENDOSCOPIST: Gatha Mayer, MD, [facility] PROCEDURE DATE:  04/09/2014 PROCEDURE:   Colonoscopy with biopsy and Submucosal injection, any substance - incomplete procedure First Screening Colonoscopy - Avg.  risk and is 50 yrs.  old or older - No.  Prior Negative Screening - Now for repeat screening. Less than 10 yrs Prior Negative Screening - Now for repeat screening.  Other: See Comments  History of Adenoma - Now for follow-up colonoscopy & has been > or = to 3 yrs.  N/A  Polyps Removed Today? No.  Polyps Removed Today? No.  Recommend repeat exam, <10 yrs? Polyps Removed Today? No.  Recommend repeat exam, <10 yrs? Yes.  Polyps Removed Today? No.  Recommend repeat exam, <10 yrs? Yes.  High risk (family or personal hx). ASA CLASS:   Class III INDICATIONS:heme + stool, change in bowel habits. MEDICATIONS: Propofol 300 mg IV and Monitored anesthesia care  DESCRIPTION OF PROCEDURE:   After the risks benefits and alternatives of the procedure were thoroughly explained, informed consent was obtained.  The digital rectal exam revealed no rectal mass, revealed decreased sphincter tone, and revealed several skin tags.   The LB [id number]  endoscope was introduced through the anus and advanced to the sigmoid colon. No adverse events experienced.   Limited by an obstruction.   The quality of the prep was good, using MiraLax  The instrument was then slowly withdrawn as the colon was fully examined.      COLON FINDINGS: 1) Large fleshy mass suspicous for villous adenoma with cancer in sigmid colon - approximately 25 cm from anus.  Could not pass through due to mass and diverticulosus.  Multiple biopsies taken and sent to pathology.  SPOT submucosal injection tattoos x 2 placed just distal to  lesion.2) severe sigmoid diverticulosis. 3) internal hemorrhoids 4) otherwise normal incomplete colonoscopy. Retroflexed views revealed internal hemorrhoids.      The scope was withdrawn and the procedure completed. COMPLICATIONS: There were no immediate complications.  ENDOSCOPIC IMPRESSION: 1) Large fleshy mass suspicous for villous adenoma with cancer in sigmid colon - approximately 25 cm from anus.  Could not pass through due to mass and diverticulosus.  Multiple biopsies taken and sent to pathology.  SPOT submucosal injection tattoos x 2 placed just distal to lesion. 2) severe sigmoid diverticulosis. 3) internal hemorrhoids 4) otherwise normal incomplete colonoscopy  RECOMMENDATIONS: 1) CBC, CMET and CEA today 2) CT chest/abd/pelvis with contrast (my office will arranfe) "sigmoid mass" 3) Surgery appointment "sigmoid mass" - my office will arrange  eSigned:  Gatha Mayer, MD, Marval Regal 04/09/2014 2:28 PM   cc: Berneta Sages, MD and The Patient

## 2014-04-09 NOTE — Progress Notes (Signed)
Report to PACU, RN, vss, BBS= Clear.  

## 2014-04-09 NOTE — Telephone Encounter (Signed)
Patient is scheduled for Dr. Leighton Ruff for 1/0/30 9:10 arrival for a 9:40 appt with [facility]  CT scan is scheduled for 04/12/14 10:30 at [facility].  She is advised of both appt dates and times

## 2014-04-09 NOTE — Progress Notes (Signed)
Called to room to assist during endoscopic procedure.  Patient ID and intended procedure confirmed with present staff. Received instructions for my participation in the procedure from the performing physician.  

## 2014-04-10 LAB — CEA: CEA: 11.7 ng/mL — ABNORMAL HIGH (ref 0.0–5.0)

## 2014-04-12 ENCOUNTER — Telehealth: Payer: Self-pay | Admitting: *Deleted

## 2014-04-12 ENCOUNTER — Ambulatory Visit (INDEPENDENT_AMBULATORY_CARE_PROVIDER_SITE_OTHER)
Admission: RE | Admit: 2014-04-12 | Discharge: 2014-04-12 | Disposition: A | Discharge status: Home / Self Care | Payer: Commercial Managed Care - HMO | Source: Ambulatory Visit | Attending: Internal Medicine | Admitting: Internal Medicine

## 2014-04-12 DIAGNOSIS — C187 Malignant neoplasm of sigmoid colon: Secondary | ICD-10-CM

## 2014-04-12 MED ORDER — IOHEXOL 300 MG/ML  SOLN
100.0000 mL | Freq: Once | INTRAMUSCULAR | Status: AC | PRN
  Administered 2014-04-12: 100 mL via INTRAVENOUS

## 2014-04-12 NOTE — Telephone Encounter (Signed)
  Follow up Call-  Call back number 04/09/2014  Post procedure Call Back phone  # [phone]  Permission to leave phone message Yes     Patient questions:  Do you have a fever, pain , or abdominal swelling? No. Pain Score  0 *  Have you tolerated food without any problems? Yes.    Have you been able to return to your normal activities? Yes.    Do you have any questions about your discharge instructions: Diet                          yes Medications  No. Follow up visit  No.  Do you have questions or concerns about your Care? No.  Actions: * If pain score is 4 or above: No action needed, pain <4.  Pt. States she has not had BM yet.  She is passing gas.  Denies abdominal pain.  Goes for CT abdomen, pelvis, chest today.  Advised to call office tommorow if she has not had BM,if abd. Swelling, Or new abdominal pain.  Encouraged to drink plenty of water today after CT scan.

## 2014-04-13 ENCOUNTER — Other Ambulatory Visit (INDEPENDENT_AMBULATORY_CARE_PROVIDER_SITE_OTHER): Payer: Self-pay | Admitting: General Surgery

## 2014-04-13 NOTE — H&P (Signed)
Heidi Marquez 04/13/2014 9:55 AM Location: [facility] Patient #: [id number] DOB: 15-Aug-1933 Married / Language: Cleophus Molt / Race: White Female History of Present Illness Leighton Ruff MD; 05/14/3242 12:14 PM) Patient words: f/u CT scan.  The patient is a 81 year old female who presents with a colonic mass. This is a 81 year old female who presents to the office with a colon mass. She was sent to Dr. Carlean Purl for evaluation due to changes in her bowel habits and several guaiac positive stools. Dr. Carlean Purl performed a colonoscopy which showed a mass in her sigmoid colon that was not traversable with the scope. Biopsies were taken and the area was tattooed. CT scans of the chest abdomen and pelvis show a sigmoid colon mass and some stable lung nodules. There are a couple abdominal nodules noted in the abdomen that are too small to characterize. Her CEA level is elevated at 11.7. She is here today to discuss surgery. She has no other major medical problems. Past surgical history is significant for a right-sided inguinal hernia repair and a partial hysterectomy. She is the sole caregiver of her husband who has dementia. She denies any chest pain or shortness of breath with exertion. Other Problems Sharman Crate, LPN; 0/02/270 5:36 AM) Back Pain  Allergies Clarise Cruz [place], LPN; 07/16/4032 7:42 AM) Penicillins  Medication History Sharman Crate, LPN; 06/20/5636 7:56 AM) Claritin (10MG  Capsule, Oral) Active. Boniva (2.5MG  Tablet, Oral) Active. (pt unsure of dose) Medications Reconciled  Social History Sharman Crate, LPN; 05/15/3293 1:88 AM) No alcohol use No caffeine use No drug use Tobacco use Never smoker.  Family History Sharman Crate, LPN; 05/13/6604 3:01 AM) Colon Cancer Family Members In General. Hypertension Son.  Pregnancy / Birth History Sharman Crate, LPN; 6/0/1093 2:35 AM) Age at menarche 13 years. Age of menopause 70-55 Gravida 2 Maternal age  28-20 Para 2     Review of Systems Clarise Cruz [facility] LPN; 06/19/3218 2:54 AM) Gastrointestinal Present- Bloody Stool and Difficulty Swallowing. Not Present- Abdominal Pain, Bloating, Change in Bowel Habits, Chronic diarrhea, Constipation, Excessive gas, Gets full quickly at meals, Hemorrhoids, Indigestion, Nausea, Rectal Pain and Vomiting. Endocrine Present- Hair Changes. Not Present- Cold Intolerance, Excessive Hunger, Heat Intolerance, Hot flashes and New Diabetes.  Vitals Clarise Cruz [facility] LPN; 03/21/621 7:62 AM) 04/13/2014 9:56 AM Weight: 152.13 lb Height: 63in Body Surface Area: 1.75 m Body Mass Index: 26.95 kg/m Temp.: 98.32F(Oral)  Pulse: 59 (Regular)  BP: 132/86 (Sitting, Left Arm, Standard)     Physical Exam Leighton Ruff MD; 09/15/1515 12:15 PM)  General Mental Status-Alert. General Appearance-Consistent with stated age. Hydration-Well hydrated. Voice-Normal.  Head and Neck Head-normocephalic, atraumatic with no lesions or palpable masses. Trachea-midline. Thyroid Gland Characteristics - normal size and consistency.  Eye Eyeball - Bilateral-Extraocular movements intact. Sclera/Conjunctiva - Bilateral-No scleral icterus.  Chest and Lung Exam Chest and lung exam reveals -quiet, even and easy respiratory effort with no use of accessory muscles and on auscultation, normal breath sounds, no adventitious sounds and normal vocal resonance. Inspection Chest Wall - Normal. Back - normal.  Cardiovascular Cardiovascular examination reveals -normal heart sounds, regular rate and rhythm with no murmurs and normal pedal pulses bilaterally.  Abdomen Inspection Inspection of the abdomen reveals - No Hernias. Skin - Scar - Right Lower Quadrant. Palpation/Percussion Palpation and Percussion of the abdomen reveal - Soft, Non Tender, No Rebound tenderness, No Rigidity (guarding) and No hepatosplenomegaly. Auscultation Auscultation of the abdomen  reveals - Bowel sounds normal.  Neurologic Neurologic evaluation reveals -alert and oriented x 3  with no impairment of recent or remote memory. Mental Status-Normal.  Musculoskeletal Normal Exam - Left-Upper Extremity Strength Normal and Lower Extremity Strength Normal. Normal Exam - Right-Upper Extremity Strength Normal and Lower Extremity Strength Normal.    Assessment & Plan Leighton Ruff MD; 02/13/4578 12:16 PM)  MASS OF COLON (569.89  K63.26) Story: 81 year old female with what appears to be a sigmoid colon cancer. We will follow up with the pathology report from her colonoscopy. I have recommended that she undergo a robotic-assisted partial colectomy. I think she would do well with this and primary anastomosis, given her very independent living status. Her metastatic workup has been been completed. There are no obvious signs of metastatic disease.  Her CT shows a mass of the sigmoid colon that lies next to the pelvic sidewall.  I can follow the LEFT ureter all the way d not see any signs ofinvasion of the ureter.  There appears to be a good fat plane in between thetumor and the ureter. Impression: The surgery and anatomy were described to the patient as well as the risks of surgery and the possible complications. These include: Bleeding, deep abdominal infections and possible wound complications such as hernia and infection, damage to adjacent structures, leak of surgical connections, which can lead to other surgeries and possibly an ostomy, possible need for other procedures, such as abscess drains in radiology, possible prolonged hospital stay, possible diarrhea from removal of part of the colon, possible constipation from narcotics, possible bowel, bladder or sexual dysfunction if having rectal surgery, prolonged fatigue/weakness or appetite loss, possible early recurrence of of disease, possible complications of their medical problems such as heart disease or arrhythmias or lung  problems, death (less than 1%). I believe the patient understands and wishes to proceed with the surgery.  Current Plans Pt Education - [facility] Colorectal Cancer (AT) Pt Education - [facility] Colon Bowel Prep 2015 Miralax/Antibiotics Started Neomycin Sulfate 500MG , 2 (two) Tablet SEE NOTE, #6, 04/13/2014, No Refill. Local Order: TAKE TWO TABLETS AT 2 PM, 3 PM, AND 10 PM THE DAY PRIOR TO SURGERY Started Flagyl 500MG , 2 (two) Tablet SEE NOTE, #6, 04/13/2014, No Refill. Local Order: Take at 2pm, 3pm, and 10pm the day prior to your colon operation

## 2014-04-14 NOTE — Progress Notes (Signed)
Quick Note:  We can let her know that there is some thickening outside the colon - not sure what this means but surgery will tell us and as stated on another note size of lesion even if not cancer denmands surgical resection.  ______

## 2014-04-21 NOTE — Progress Notes (Signed)
Quick Note:  I spoke to patient re: results of this, CT and surgery appt She is scheduled for surgery  She needs a colonoscopy recall 6 months from now  No letter ______

## 2014-05-10 ENCOUNTER — Other Ambulatory Visit (HOSPITAL_COMMUNITY): Payer: Self-pay | Admitting: *Deleted

## 2014-05-10 NOTE — Patient Instructions (Addendum)
Heidi Marquez  05/10/2014   Your procedure is scheduled on: Monday 05/17/2014  Report to [facility] Main  Entrance and follow signs to               [place] at [place]  AM.  Call this number if you have problems the morning of surgery [phone]   Remember: [place] DR.THOMAS'S OFFICE THE DAY BEFORE SURGERY1  Do not eat food or drink liquids :After Midnight.     Take these medicines the morning of surgery with A SIP OF WATER: Claritin if needed                               You may not have any metal on your body including hair pins and              piercings  Do not wear jewelry, make-up, lotions, powders or perfumes.             Do not wear nail polish.  Do not shave  48 hours prior to surgery.              Men may shave face and neck.   Do not bring valuables to the hospital. [place].  Contacts, dentures or bridgework may not be worn into surgery.  Leave suitcase in the car. After surgery it may be brought to your room.     Patients discharged the day of surgery will not be allowed to drive home.  Name and phone number of your driver:  Special Instructions: N/A              Please read over the following fact sheets you were given: _____________________________________________________________________             [facility] - Preparing for Surgery Before surgery, you can play an important role.  Because skin is not sterile, your skin needs to be as free of germs as possible.  You can reduce the number of germs on your skin by washing with CHG (chlorahexidine gluconate) soap before surgery.  CHG is an antiseptic cleaner which kills germs and bonds with the skin to continue killing germs even after washing. Please DO NOT use if you have an allergy to CHG or antibacterial soaps.  If your skin becomes reddened/irritated stop using the CHG and inform your nurse when you  arrive at Short Stay. Do not shave (including legs and underarms) for at least 48 hours prior to the first CHG shower.  You may shave your face/neck. Please follow these instructions carefully:  1.  Shower with CHG Soap the night before surgery and the  morning of Surgery.  2.  If you choose to wash your hair, wash your hair first as usual with your  normal  shampoo.  3.  After you shampoo, rinse your hair and body thoroughly to remove the  shampoo.                           4.  Use CHG as you would any other liquid soap.  You can apply chg directly  to the skin and wash  Gently with a scrungie or clean washcloth.  5.  Apply the CHG Soap to your body ONLY FROM THE NECK DOWN.   Do not use on face/ open                           Wound or open sores. Avoid contact with eyes, ears mouth and genitals (private parts).                       Wash face,  Genitals (private parts) with your normal soap.             6.  Wash thoroughly, paying special attention to the area where your surgery  will be performed.  7.  Thoroughly rinse your body with warm water from the neck down.  8.  DO NOT shower/wash with your normal soap after using and rinsing off  the CHG Soap.                9.  Pat yourself dry with a clean towel.            10.  Wear clean pajamas.            11.  Place clean sheets on your bed the night of your first shower and do not  sleep with pets. Day of Surgery : Do not apply any lotions/deodorants the morning of surgery.  Please wear clean clothes to the hospital/surgery center.  FAILURE TO FOLLOW THESE INSTRUCTIONS MAY RESULT IN THE CANCELLATION OF YOUR SURGERY PATIENT SIGNATURE_________________________________  NURSE SIGNATURE__________________________________  ________________________________________________________________________   Heidi Marquez  An incentive spirometer is a tool that can help keep your lungs clear and active. This tool measures how  well you are filling your lungs with each breath. Taking long deep breaths may help reverse or decrease the chance of developing breathing (pulmonary) problems (especially infection) following:  A long period of time when you are unable to move or be active. BEFORE THE PROCEDURE   If the spirometer includes an indicator to show your best effort, your nurse or respiratory therapist will set it to a desired goal.  If possible, sit up straight or lean slightly forward. Try not to slouch.  Hold the incentive spirometer in an upright position. INSTRUCTIONS FOR USE   Sit on the edge of your bed if possible, or sit up as far as you can in bed or on a chair.  Hold the incentive spirometer in an upright position.  Breathe out normally.  Place the mouthpiece in your mouth and seal your lips tightly around it.  Breathe in slowly and as deeply as possible, raising the piston or the ball toward the top of the column.  Hold your breath for 3-5 seconds or for as long as possible. Allow the piston or ball to fall to the bottom of the column.  Remove the mouthpiece from your mouth and breathe out normally.  Rest for a few seconds and repeat Steps 1 through 7 at least 10 times every 1-2 hours when you are awake. Take your time and take a few normal breaths between deep breaths.  The spirometer may include an indicator to show your best effort. Use the indicator as a goal to work toward during each repetition.  After each set of 10 deep breaths, practice coughing to be sure your lungs are clear. If you have an incision (the cut made at the time of surgery),  support your incision when coughing by placing a pillow or rolled up towels firmly against it. Once you are able to get out of bed, walk around indoors and cough well. You may stop using the incentive spirometer when instructed by your caregiver.  RISKS AND COMPLICATIONS  Take your time so you do not get dizzy or light-headed.  If you are in  pain, you may need to take or ask for pain medication before doing incentive spirometry. It is harder to take a deep breath if you are having pain. AFTER USE  Rest and breathe slowly and easily.  It can be helpful to keep track of a log of your progress. Your caregiver can provide you with a simple table to help with this. If you are using the spirometer at home, follow these instructions: [place] IF:   You are having difficultly using the spirometer.  You have trouble using the spirometer as often as instructed.  Your pain medication is not giving enough relief while using the spirometer.  You develop fever of 100.5 F (38.1 C) or higher. SEEK IMMEDIATE MEDICAL CARE IF:   You cough up bloody sputum that had not been present before.  You develop fever of 102 F (38.9 C) or greater.  You develop worsening pain at or near the incision site. MAKE SURE YOU:   Understand these instructions.  Will watch your condition.  Will get help right away if you are not doing well or get worse. Document Released: 06/11/2006 Document Revised: 04/23/2011 Document Reviewed: 08/12/2006 ExitCare Patient Information 2014 ExitCare, [place].   ________________________________________________________________________  WHAT IS A BLOOD TRANSFUSION? Blood Transfusion Information  A transfusion is the replacement of blood or some of its parts. Blood is made up of multiple cells which provide different functions.  Red blood cells carry oxygen and are used for blood loss replacement.  White blood cells fight against infection.  Platelets control bleeding.  Plasma helps clot blood.  Other blood products are available for specialized needs, such as hemophilia or other clotting disorders. BEFORE THE TRANSFUSION  Who gives blood for transfusions?   Healthy volunteers who are fully evaluated to make sure their blood is safe. This is blood bank blood. Transfusion therapy is the safest it has  ever been in the practice of medicine. Before blood is taken from a donor, a complete history is taken to make sure that person has no history of diseases nor engages in risky social behavior (examples are intravenous drug use or sexual activity with multiple partners). The donor's travel history is screened to minimize risk of transmitting infections, such as malaria. The donated blood is tested for signs of infectious diseases, such as HIV and hepatitis. The blood is then tested to be sure it is compatible with you in order to minimize the chance of a transfusion reaction. If you or a relative donates blood, this is often done in anticipation of surgery and is not appropriate for emergency situations. It takes many days to process the donated blood. RISKS AND COMPLICATIONS Although transfusion therapy is very safe and saves many lives, the main dangers of transfusion include:   Getting an infectious disease.  Developing a transfusion reaction. This is an allergic reaction to something in the blood you were given. Every precaution is taken to prevent this. The decision to have a blood transfusion has been considered carefully by your caregiver before blood is given. Blood is not given unless the benefits outweigh the risks. AFTER THE TRANSFUSION  Right after receiving a blood transfusion, you will usually feel much better and more energetic. This is especially true if your red blood cells have gotten low (anemic). The transfusion raises the level of the red blood cells which carry oxygen, and this usually causes an energy increase.  The nurse administering the transfusion will monitor you carefully for complications. HOME CARE INSTRUCTIONS  No special instructions are needed after a transfusion. You may find your energy is better. Speak with your caregiver about any limitations on activity for underlying diseases you may have. SEEK MEDICAL CARE IF:   Your condition is not improving after your  transfusion.  You develop redness or irritation at the intravenous (IV) site. SEEK IMMEDIATE MEDICAL CARE IF:  Any of the following symptoms occur over the next 12 hours:  Shaking chills.  You have a temperature by mouth above 102 F (38.9 C), not controlled by medicine.  Chest, back, or muscle pain.  People around you feel you are not acting correctly or are confused.  Shortness of breath or difficulty breathing.  Dizziness and fainting.  You get a rash or develop hives.  You have a decrease in urine output.  Your urine turns a dark color or changes to pink, red, or brown. Any of the following symptoms occur over the next 10 days:  You have a temperature by mouth above 102 F (38.9 C), not controlled by medicine.  Shortness of breath.  Weakness after normal activity.  The white part of the eye turns yellow (jaundice).  You have a decrease in the amount of urine or are urinating less often.  Your urine turns a dark color or changes to pink, red, or brown. Document Released: 01/27/2000 Document Revised: 04/23/2011 Document Reviewed: 09/15/2007 ExitCare Patient Information 2014 ExitCare, [place].  _______________________________________________________________________   CLEAR LIQUID DIET   Foods Allowed                                                                     Foods Excluded  Coffee and tea, regular and decaf                             liquids that you cannot  Plain Jell-O in any flavor                                             see through such as: Fruit ices (not with fruit pulp)                                     milk, soups, orange juice  Iced Popsicles                                    All solid food Carbonated beverages, regular and diet  Cranberry, grape and apple juices Sports drinks like Gatorade Lightly seasoned clear broth or consume(fat free) Sugar, honey syrup  Sample Menu Breakfast                                 Lunch                                     Supper Cranberry juice                    Beef broth                            Chicken broth Jell-O                                     Grape juice                           Apple juice Coffee or tea                        Jell-O                                      Popsicle                                                Coffee or tea                        Coffee or tea  _____________________________________________________________________

## 2014-05-12 ENCOUNTER — Encounter (HOSPITAL_COMMUNITY): Payer: Self-pay

## 2014-05-12 ENCOUNTER — Encounter (HOSPITAL_COMMUNITY)
Admission: RE | Admit: 2014-05-12 | Discharge: 2014-05-12 | Disposition: A | Discharge status: Home / Self Care | Payer: Commercial Managed Care - HMO | Source: Ambulatory Visit | Attending: General Surgery | Admitting: General Surgery

## 2014-05-12 ENCOUNTER — Encounter (HOSPITAL_COMMUNITY): Payer: Self-pay | Admitting: *Deleted

## 2014-05-12 DIAGNOSIS — Z01812 Encounter for preprocedural laboratory examination: Secondary | ICD-10-CM | POA: Insufficient documentation

## 2014-05-12 LAB — BASIC METABOLIC PANEL
ANION GAP: 7 (ref 5–15)
BUN: 17 mg/dL (ref 6–23)
CHLORIDE: 106 mmol/L (ref 96–112)
CO2: 29 mmol/L (ref 19–32)
CREATININE: 0.84 mg/dL (ref 0.50–1.10)
Calcium: 8.9 mg/dL (ref 8.4–10.5)
GFR calc Af Amer: 75 mL/min — ABNORMAL LOW (ref >90)
GFR, EST NON AFRICAN AMERICAN: 65 mL/min — AB (ref >90)
Glucose, Bld: 96 mg/dL (ref 70–99)
Potassium: 5.1 mmol/L (ref 3.5–5.1)
SODIUM: 142 mmol/L (ref 135–145)

## 2014-05-12 LAB — CBC
HEMATOCRIT: 36.5 % (ref 36.0–46.0)
HEMOGLOBIN: 11.8 g/dL — AB (ref 12.0–15.0)
MCH: 29.7 pg (ref 26.0–34.0)
MCHC: 32.3 g/dL (ref 30.0–36.0)
MCV: 91.9 fL (ref 78.0–100.0)
PLATELETS: 169 10*3/uL (ref 150–400)
RBC: 3.97 MIL/uL (ref 3.87–5.11)
RDW: 14.2 % (ref 11.5–15.5)
WBC: 5.9 10*3/uL (ref 4.0–10.5)

## 2014-05-13 LAB — HEMOGLOBIN A1C
Hgb A1c MFr Bld: 5.9 % — ABNORMAL HIGH (ref 4.8–5.6)
Mean Plasma Glucose: 123 mg/dL

## 2014-05-16 MED ORDER — CLINDAMYCIN PHOSPHATE 900 MG/50ML IV SOLN
900.0000 mg | INTRAVENOUS | Status: AC
  Administered 2014-05-17: 900 mg via INTRAVENOUS

## 2014-05-16 MED ORDER — GENTAMICIN SULFATE 40 MG/ML IJ SOLN
5.0000 mg/kg | INTRAVENOUS | Status: AC
  Administered 2014-05-17: 340 mg via INTRAVENOUS
  Filled 2014-05-16 (×2): qty 8.5

## 2014-05-17 ENCOUNTER — Inpatient Hospital Stay (HOSPITAL_COMMUNITY): Payer: Commercial Managed Care - HMO | Admitting: Certified Registered Nurse Anesthetist

## 2014-05-17 ENCOUNTER — Encounter (HOSPITAL_COMMUNITY)
Admission: AD | Disposition: A | Discharge status: Home / Self Care | Payer: Self-pay | Source: Home / Self Care | Attending: General Surgery

## 2014-05-17 ENCOUNTER — Inpatient Hospital Stay (HOSPITAL_COMMUNITY)
Admission: AD | Admit: 2014-05-17 | Discharge: 2014-05-20 | DRG: 331 | Disposition: A | Discharge status: Home / Self Care | Payer: Commercial Managed Care - HMO | Attending: General Surgery | Admitting: General Surgery

## 2014-05-17 ENCOUNTER — Encounter (HOSPITAL_COMMUNITY): Payer: Self-pay

## 2014-05-17 DIAGNOSIS — C189 Malignant neoplasm of colon, unspecified: Secondary | ICD-10-CM | POA: Diagnosis present

## 2014-05-17 DIAGNOSIS — K219 Gastro-esophageal reflux disease without esophagitis: Secondary | ICD-10-CM | POA: Diagnosis present

## 2014-05-17 DIAGNOSIS — C187 Malignant neoplasm of sigmoid colon: Principal | ICD-10-CM | POA: Diagnosis present

## 2014-05-17 DIAGNOSIS — C182 Malignant neoplasm of ascending colon: Secondary | ICD-10-CM | POA: Diagnosis present

## 2014-05-17 DIAGNOSIS — Z880 Allergy status to penicillin: Secondary | ICD-10-CM

## 2014-05-17 LAB — TYPE AND SCREEN
ABO/RH(D): A POS
Antibody Screen: NEGATIVE

## 2014-05-17 LAB — ABO/RH: ABO/RH(D): A POS

## 2014-05-17 SURGERY — COLECTOMY, PARTIAL, ROBOT-ASSISTED, LAPAROSCOPIC
Anesthesia: General

## 2014-05-17 MED ORDER — ONDANSETRON HCL 4 MG/2ML IJ SOLN
INTRAMUSCULAR | Status: AC
  Filled 2014-05-17: qty 2

## 2014-05-17 MED ORDER — GLYCOPYRROLATE 0.2 MG/ML IJ SOLN
INTRAMUSCULAR | Status: AC
  Filled 2014-05-17: qty 2

## 2014-05-17 MED ORDER — ENOXAPARIN SODIUM 40 MG/0.4ML ~~LOC~~ SOLN
40.0000 mg | SUBCUTANEOUS | Status: DC
  Administered 2014-05-18 – 2014-05-20 (×3): 40 mg via SUBCUTANEOUS
  Filled 2014-05-17 (×5): qty 0.4

## 2014-05-17 MED ORDER — LORATADINE 10 MG PO TABS
10.0000 mg | ORAL_TABLET | Freq: Every day | ORAL | Status: DC
  Administered 2014-05-18 – 2014-05-20 (×3): 10 mg via ORAL
  Filled 2014-05-17 (×3): qty 1

## 2014-05-17 MED ORDER — ONDANSETRON HCL 4 MG PO TABS: 4.0000 mg | ORAL_TABLET | Freq: Four times a day (QID) | ORAL | Status: DC | PRN

## 2014-05-17 MED ORDER — FENTANYL CITRATE 0.05 MG/ML IJ SOLN
INTRAMUSCULAR | Status: DC | PRN
  Administered 2014-05-17: 50 ug via INTRAVENOUS
  Administered 2014-05-17 (×2): 100 ug via INTRAVENOUS

## 2014-05-17 MED ORDER — CETYLPYRIDINIUM CHLORIDE 0.05 % MT LIQD
7.0000 mL | Freq: Two times a day (BID) | OROMUCOSAL | Status: DC
  Administered 2014-05-18 – 2014-05-19 (×3): 7 mL via OROMUCOSAL

## 2014-05-17 MED ORDER — CLINDAMYCIN PHOSPHATE 900 MG/50ML IV SOLN
INTRAVENOUS | Status: AC
  Filled 2014-05-17: qty 50

## 2014-05-17 MED ORDER — FENTANYL CITRATE 0.05 MG/ML IJ SOLN
INTRAMUSCULAR | Status: AC
  Filled 2014-05-17: qty 5

## 2014-05-17 MED ORDER — HYDROCODONE-ACETAMINOPHEN 5-325 MG PO TABS
1.0000 | ORAL_TABLET | ORAL | Status: DC | PRN
  Administered 2014-05-18 – 2014-05-19 (×4): 1 via ORAL
  Filled 2014-05-17: qty 2
  Filled 2014-05-17 (×3): qty 1

## 2014-05-17 MED ORDER — GLYCOPYRROLATE 0.2 MG/ML IJ SOLN
INTRAMUSCULAR | Status: DC | PRN
  Administered 2014-05-17: 1 mg via INTRAVENOUS

## 2014-05-17 MED ORDER — ACETAMINOPHEN 500 MG PO TABS
1000.0000 mg | ORAL_TABLET | Freq: Four times a day (QID) | ORAL | Status: AC
  Administered 2014-05-17 – 2014-05-18 (×3): 1000 mg via ORAL
  Filled 2014-05-17 (×3): qty 2

## 2014-05-17 MED ORDER — SODIUM CHLORIDE 0.9 % IJ SOLN
INTRAMUSCULAR | Status: AC
  Filled 2014-05-17: qty 10

## 2014-05-17 MED ORDER — FENTANYL CITRATE 0.05 MG/ML IJ SOLN
INTRAMUSCULAR | Status: AC
  Filled 2014-05-17: qty 2

## 2014-05-17 MED ORDER — LACTATED RINGERS IV SOLN
INTRAVENOUS | Status: DC
  Administered 2014-05-17: 12:00:00 via INTRAVENOUS
  Administered 2014-05-17: 75 mL/h via INTRAVENOUS

## 2014-05-17 MED ORDER — MORPHINE SULFATE 2 MG/ML IJ SOLN
INTRAMUSCULAR | Status: AC
  Administered 2014-05-17: 2 mg via INTRAVENOUS
  Filled 2014-05-17: qty 1

## 2014-05-17 MED ORDER — NEOSTIGMINE METHYLSULFATE 10 MG/10ML IV SOLN
INTRAVENOUS | Status: DC | PRN
  Administered 2014-05-17: 5 mg via INTRAVENOUS

## 2014-05-17 MED ORDER — NEOSTIGMINE METHYLSULFATE 10 MG/10ML IV SOLN
INTRAVENOUS | Status: AC
  Filled 2014-05-17: qty 1

## 2014-05-17 MED ORDER — LIDOCAINE HCL (CARDIAC) 20 MG/ML IV SOLN
INTRAVENOUS | Status: AC
  Filled 2014-05-17: qty 5

## 2014-05-17 MED ORDER — LIDOCAINE HCL (CARDIAC) 20 MG/ML IV SOLN
INTRAVENOUS | Status: DC | PRN
  Administered 2014-05-17: 50 mg via INTRAVENOUS

## 2014-05-17 MED ORDER — PROPOFOL 10 MG/ML IV BOLUS
INTRAVENOUS | Status: AC
  Filled 2014-05-17: qty 20

## 2014-05-17 MED ORDER — HYDROMORPHONE HCL 1 MG/ML IJ SOLN: 0.2500 mg | INTRAMUSCULAR | Status: DC | PRN

## 2014-05-17 MED ORDER — ALVIMOPAN 12 MG PO CAPS
12.0000 mg | ORAL_CAPSULE | Freq: Once | ORAL | Status: AC
  Administered 2014-05-17: 12 mg via ORAL
  Filled 2014-05-17: qty 1

## 2014-05-17 MED ORDER — PROPOFOL 10 MG/ML IV BOLUS
INTRAVENOUS | Status: DC | PRN
  Administered 2014-05-17: 150 mg via INTRAVENOUS

## 2014-05-17 MED ORDER — MIDAZOLAM HCL 2 MG/2ML IJ SOLN
INTRAMUSCULAR | Status: AC
  Filled 2014-05-17: qty 2

## 2014-05-17 MED ORDER — ONDANSETRON HCL 4 MG/2ML IJ SOLN: 4.0000 mg | Freq: Four times a day (QID) | INTRAMUSCULAR | Status: DC | PRN

## 2014-05-17 MED ORDER — FENTANYL CITRATE 0.05 MG/ML IJ SOLN
25.0000 ug | INTRAMUSCULAR | Status: DC | PRN
  Administered 2014-05-17 (×2): 25 ug via INTRAVENOUS

## 2014-05-17 MED ORDER — PHENYLEPHRINE HCL 10 MG/ML IJ SOLN
INTRAMUSCULAR | Status: DC | PRN
  Administered 2014-05-17: 80 ug via INTRAVENOUS

## 2014-05-17 MED ORDER — CHLORHEXIDINE GLUCONATE 0.12 % MT SOLN
15.0000 mL | Freq: Two times a day (BID) | OROMUCOSAL | Status: DC
  Administered 2014-05-17 – 2014-05-19 (×4): 15 mL via OROMUCOSAL
  Filled 2014-05-17 (×6): qty 15

## 2014-05-17 MED ORDER — BUPIVACAINE-EPINEPHRINE 0.25% -1:200000 IJ SOLN
INTRAMUSCULAR | Status: DC | PRN
  Administered 2014-05-17: 20 mL

## 2014-05-17 MED ORDER — MENTHOL 3 MG MT LOZG
1.0000 | LOZENGE | OROMUCOSAL | Status: DC | PRN
  Administered 2014-05-17: 3 mg via ORAL
  Filled 2014-05-17: qty 9

## 2014-05-17 MED ORDER — ROCURONIUM BROMIDE 100 MG/10ML IV SOLN
INTRAVENOUS | Status: AC
  Filled 2014-05-17: qty 1

## 2014-05-17 MED ORDER — ALVIMOPAN 12 MG PO CAPS
12.0000 mg | ORAL_CAPSULE | Freq: Two times a day (BID) | ORAL | Status: DC
  Filled 2014-05-17 (×2): qty 1

## 2014-05-17 MED ORDER — MIDAZOLAM HCL 5 MG/5ML IJ SOLN
INTRAMUSCULAR | Status: DC | PRN
  Administered 2014-05-17: 2 mg via INTRAVENOUS

## 2014-05-17 MED ORDER — 0.9 % SODIUM CHLORIDE (POUR BTL) OPTIME
TOPICAL | Status: DC | PRN
  Administered 2014-05-17: 2000 mL

## 2014-05-17 MED ORDER — EPHEDRINE SULFATE 50 MG/ML IJ SOLN
INTRAMUSCULAR | Status: AC
  Filled 2014-05-17: qty 1

## 2014-05-17 MED ORDER — GLYCOPYRROLATE 0.2 MG/ML IJ SOLN
INTRAMUSCULAR | Status: AC
  Filled 2014-05-17: qty 1

## 2014-05-17 MED ORDER — HEPARIN SODIUM (PORCINE) 5000 UNIT/ML IJ SOLN
INTRAMUSCULAR | Status: AC
  Filled 2014-05-17: qty 1

## 2014-05-17 MED ORDER — LACTATED RINGERS IV SOLN
INTRAVENOUS | Status: DC | PRN
  Administered 2014-05-17 (×2): via INTRAVENOUS

## 2014-05-17 MED ORDER — ONDANSETRON HCL 4 MG/2ML IJ SOLN
INTRAMUSCULAR | Status: DC | PRN
  Administered 2014-05-17: 4 mg via INTRAVENOUS

## 2014-05-17 MED ORDER — BUPIVACAINE-EPINEPHRINE (PF) 0.25% -1:200000 IJ SOLN
INTRAMUSCULAR | Status: AC
  Filled 2014-05-17: qty 30

## 2014-05-17 MED ORDER — HEPARIN SODIUM (PORCINE) 5000 UNIT/ML IJ SOLN
5000.0000 [IU] | Freq: Once | INTRAMUSCULAR | Status: AC
  Administered 2014-05-17: 5000 [IU] via SUBCUTANEOUS
  Filled 2014-05-17: qty 1

## 2014-05-17 MED ORDER — ROCURONIUM BROMIDE 100 MG/10ML IV SOLN
INTRAVENOUS | Status: DC | PRN
  Administered 2014-05-17: 20 mg via INTRAVENOUS
  Administered 2014-05-17: 40 mg via INTRAVENOUS
  Administered 2014-05-17: 10 mg via INTRAVENOUS

## 2014-05-17 MED ORDER — MORPHINE SULFATE 2 MG/ML IJ SOLN
2.0000 mg | INTRAMUSCULAR | Status: DC | PRN
  Administered 2014-05-17: 2 mg via INTRAVENOUS
  Filled 2014-05-17: qty 1

## 2014-05-17 MED ORDER — EPHEDRINE SULFATE 50 MG/ML IJ SOLN
INTRAMUSCULAR | Status: DC | PRN
  Administered 2014-05-17 (×5): 10 mg via INTRAVENOUS

## 2014-05-17 SURGICAL SUPPLY — 88 items
ADH SKN CLS APL DERMABOND .7 (GAUZE/BANDAGES/DRESSINGS) ×1
BLADE EXTENDED COATED 6.5IN (ELECTRODE) ×3 IMPLANT
BLADE SURG SZ11 CARB STEEL (BLADE) ×3 IMPLANT
CANNULA REDUC XI 12-8 STAPL (CANNULA) ×1
CANNULA REDUC XI 12-8MM STAPL (CANNULA) ×1
CANNULA REDUCER 12-8 DVNC XI (CANNULA) ×1 IMPLANT
CELLS DAT CNTRL 66122 CELL SVR (MISCELLANEOUS) IMPLANT
CLIP LIGATING HEM O LOK PURPLE (MISCELLANEOUS) IMPLANT
CLIP LIGATING HEMOLOK MED (MISCELLANEOUS) IMPLANT
COVER TIP SHEARS 8 DVNC (MISCELLANEOUS) ×1 IMPLANT
COVER TIP SHEARS 8MM DA VINCI (MISCELLANEOUS) ×2
DECANTER SPIKE VIAL GLASS SM (MISCELLANEOUS) ×3 IMPLANT
DERMABOND ADVANCED (GAUZE/BANDAGES/DRESSINGS) ×2
DERMABOND ADVANCED .7 DNX12 (GAUZE/BANDAGES/DRESSINGS) IMPLANT
DEVICE TROCAR PUNCTURE CLOSURE (ENDOMECHANICALS) IMPLANT
DRAPE ARM DVNC X/XI (DISPOSABLE) ×4 IMPLANT
DRAPE COLUMN DVNC XI (DISPOSABLE) ×1 IMPLANT
DRAPE DA VINCI XI ARM (DISPOSABLE) ×8
DRAPE DA VINCI XI COLUMN (DISPOSABLE) ×2
DRAPE SURG IRRIG POUCH 19X23 (DRAPES) ×3 IMPLANT
DRSG OPSITE POSTOP 3X4 (GAUZE/BANDAGES/DRESSINGS) ×2 IMPLANT
DRSG OPSITE POSTOP 4X10 (GAUZE/BANDAGES/DRESSINGS) IMPLANT
DRSG OPSITE POSTOP 4X6 (GAUZE/BANDAGES/DRESSINGS) IMPLANT
DRSG OPSITE POSTOP 4X8 (GAUZE/BANDAGES/DRESSINGS) IMPLANT
ELECT PENCIL ROCKER SW 15FT (MISCELLANEOUS) ×6 IMPLANT
ELECT REM PT RETURN 15FT ADLT (MISCELLANEOUS) ×3 IMPLANT
ENDOLOOP SUT PDS II  0 18 (SUTURE)
ENDOLOOP SUT PDS II 0 18 (SUTURE) IMPLANT
EVACUATOR SILICONE 100CC (DRAIN) IMPLANT
GAUZE SPONGE 4X4 12PLY STRL (GAUZE/BANDAGES/DRESSINGS) IMPLANT
GLOVE BIO SURGEON STRL SZ 6.5 (GLOVE) ×6 IMPLANT
GLOVE BIO SURGEONS STRL SZ 6.5 (GLOVE) ×3
GLOVE BIOGEL PI IND STRL 7.0 (GLOVE) ×3 IMPLANT
GLOVE BIOGEL PI INDICATOR 7.0 (GLOVE) ×6
GOWN STRL REUS W/TWL 2XL LVL3 (GOWN DISPOSABLE) ×9 IMPLANT
GOWN STRL REUS W/TWL XL LVL3 (GOWN DISPOSABLE) ×12 IMPLANT
HOLDER FOLEY CATH W/STRAP (MISCELLANEOUS) ×3 IMPLANT
LEGGING LITHOTOMY PAIR STRL (DRAPES) ×3 IMPLANT
NDL INSUFFLATION 14GA 120MM (NEEDLE) ×1 IMPLANT
NEEDLE INSUFFLATION 14GA 120MM (NEEDLE) ×3 IMPLANT
PACK CARDIOVASCULAR III (CUSTOM PROCEDURE TRAY) ×3 IMPLANT
PACK COLON (CUSTOM PROCEDURE TRAY) ×3 IMPLANT
PACK GENERAL/GYN (CUSTOM PROCEDURE TRAY) ×3 IMPLANT
PORT LAP GEL ALEXIS MED 5-9CM (MISCELLANEOUS) ×2 IMPLANT
RELOAD STAPLE 45 BLU REG DVNC (STAPLE) IMPLANT
RELOAD STAPLE 45 GRN THCK DVNC (STAPLE) IMPLANT
RETRACTOR WND ALEXIS 18 MED (MISCELLANEOUS) IMPLANT
RTRCTR WOUND ALEXIS 18CM MED (MISCELLANEOUS)
SCISSORS LAP 5X45 EPIX DISP (ENDOMECHANICALS) ×3 IMPLANT
SEAL CANN UNIV 5-8 DVNC XI (MISCELLANEOUS) ×3 IMPLANT
SEAL XI 5MM-8MM UNIVERSAL (MISCELLANEOUS) ×6
SEALER VESSEL DA VINCI XI (MISCELLANEOUS) ×2
SEALER VESSEL EXT DVNC XI (MISCELLANEOUS) ×1 IMPLANT
SET IRRIG TUBING LAPAROSCOPIC (IRRIGATION / IRRIGATOR) ×3 IMPLANT
SLEEVE XCEL OPT CAN 5 100 (ENDOMECHANICALS) IMPLANT
SOLUTION ELECTROLUBE (MISCELLANEOUS) ×3 IMPLANT
STAPLER 45 BLU RELOAD XI (STAPLE) ×1 IMPLANT
STAPLER 45 BLUE RELOAD XI (STAPLE) ×2
STAPLER 45 GREEN RELOAD XI (STAPLE)
STAPLER 45 GRN RELOAD XI (STAPLE) IMPLANT
STAPLER CANNULA SEAL DVNC XI (STAPLE) IMPLANT
STAPLER CANNULA SEAL XI (STAPLE)
STAPLER CIRC ILS CVD 33MM 37CM (STAPLE) ×2 IMPLANT
STAPLER SHEATH (SHEATH)
STAPLER SHEATH ENDOWRIST DVNC (SHEATH) IMPLANT
STAPLER VISISTAT 35W (STAPLE) ×3 IMPLANT
SUT PDS AB 1 CTX 36 (SUTURE) IMPLANT
SUT PDS AB 1 TP1 96 (SUTURE) IMPLANT
SUT PROLENE 2 0 KS (SUTURE) ×3 IMPLANT
SUT SILK 2 0 (SUTURE) ×3
SUT SILK 2 0 SH CR/8 (SUTURE) ×3 IMPLANT
SUT SILK 2-0 18XBRD TIE 12 (SUTURE) ×1 IMPLANT
SUT SILK 3 0 (SUTURE) ×3
SUT SILK 3 0 SH CR/8 (SUTURE) ×3 IMPLANT
SUT SILK 3-0 18XBRD TIE 12 (SUTURE) ×1 IMPLANT
SUT VIC AB 2-0 SH 18 (SUTURE) IMPLANT
SUT VIC AB 2-0 SH 27 (SUTURE) ×3
SUT VIC AB 2-0 SH 27X BRD (SUTURE) IMPLANT
SUT VIC AB 4-0 PS2 27 (SUTURE) ×6 IMPLANT
SYRINGE 10CC LL (SYRINGE) ×3 IMPLANT
SYS LAPSCP GELPORT 120MM (MISCELLANEOUS)
SYSTEM LAPSCP GELPORT 120MM (MISCELLANEOUS) IMPLANT
TOWEL OR NON WOVEN STRL DISP B (DISPOSABLE) ×3 IMPLANT
TRAY FOLEY CATH 14FRSI W/METER (CATHETERS) ×3 IMPLANT
TROCAR BLADELESS OPT 5 100 (ENDOMECHANICALS) ×3 IMPLANT
TUBING CONNECTING 10 (TUBING) IMPLANT
TUBING CONNECTING 10' (TUBING)
TUBING FILTER THERMOFLATOR (ELECTROSURGICAL) ×3 IMPLANT

## 2014-05-17 NOTE — Anesthesia Preprocedure Evaluation (Addendum)
Anesthesia Evaluation  Patient identified by MRN, date of birth, ID band Patient awake    Reviewed: Allergy & Precautions, H&P , NPO status , Patient's Chart, lab work & pertinent test results  Airway Mallampati: II  TM Distance: >3 FB Neck ROM: full    Dental no notable dental hx. (+) Teeth Intact, Dental Advisory Given   Pulmonary neg pulmonary ROS,  breath sounds clear to auscultation  Pulmonary exam normal       Cardiovascular Exercise Tolerance: Good + dysrhythmias Rhythm:regular Rate:Normal  Palpitations when going up steps   Neuro/Psych Macular degeneration negative neurological ROS  negative psych ROS   GI/Hepatic negative GI ROS, Neg liver ROS, hiatal hernia, GERD-  ,Esophageal stricture   Endo/Other  negative endocrine ROS  Renal/GU negative Renal ROS  negative genitourinary   Musculoskeletal   Abdominal   Peds  Hematology negative hematology ROS (+)   Anesthesia Other Findings epistaxis  Reproductive/Obstetrics negative OB ROS                            Anesthesia Physical Anesthesia Plan  ASA: II  Anesthesia Plan: General   Post-op Pain Management:    Induction: Intravenous  Airway Management Planned: Oral ETT  Additional Equipment:   Intra-op Plan:   Post-operative Plan: Extubation in OR  Informed Consent: I have reviewed the patients History and Physical, chart, labs and discussed the procedure including the risks, benefits and alternatives for the proposed anesthesia with the patient or authorized representative who has indicated his/her understanding and acceptance.   Dental Advisory Given  Plan Discussed with: CRNA and Surgeon  Anesthesia Plan Comments:         Anesthesia Quick Evaluation

## 2014-05-17 NOTE — Op Note (Signed)
05/17/2014  11:16 AM  PATIENT:  Heidi Marquez  81 y.o. female  Patient Care Team: Prince Solian, MD as PCP - General (Internal Medicine)  PRE-OPERATIVE DIAGNOSIS:  colon mass  POST-OPERATIVE DIAGNOSIS:  Sigmoid colon mass  PROCEDURE:  XI ROBOT ASSISTED SIGMOIDECTOMY   Surgeon(s): Leighton Ruff, MD  ASSISTANT: Talmadge Chad) Yvone Neu Boccaccio PA-C  ANESTHESIA:   local and general  EBL: 100 ml  Total I/O In: 1300 [I.V.:1300] Out: 250 [Urine:250]  Delay start of Pharmacological VTE agent (>24hrs) due to surgical blood loss or risk of bleeding:  no  DRAINS: none   SPECIMEN:  Source of Specimen:  Sigmoid colon  DISPOSITION OF SPECIMEN:  PATHOLOGY  COUNTS:  YES  PLAN OF CARE: Admit to inpatient   PATIENT DISPOSITION:  PACU - hemodynamically stable.  INDICATION:    This is a 81 y.o. F with an obstructing sigmoid mass.  Biopsies reveal tubular adenoma.  CEA was 11.2.  CT scans show no obvious metastatic disease.  We discussed that the mass is most likely harboring some cancer.  I recommended segmental resection:  The anatomy & physiology of the digestive tract was discussed.  The pathophysiology was discussed.  Natural history risks without surgery was discussed.   I worked to give an overview of the disease and the frequent need to have multispecialty involvement.  I feel the risks of no intervention will lead to serious problems that outweigh the operative risks; therefore, I recommended a partial colectomy to remove the pathology.  Laparoscopic & open techniques were discussed.   Risks such as bleeding, infection, abscess, leak, reoperation, possible ostomy, hernia, heart attack, death, and other risks were discussed.  I noted a good likelihood this will help address the problem.   Goals of post-operative recovery were discussed as well.    The patient expressed understanding & wished to proceed with surgery.  OR FINDINGS:   Patient had mass at sigmoid colon, distal tattoo  visualized just proximal to recto sigmoid junction.    No obvious metastatic disease on visceral parietal peritoneum or liver.  The anastomosis rests ~14 cm from the anal verge by rigid proctoscopy.  DESCRIPTION:   Informed consent was confirmed.  The patient underwent general anaesthesia without difficulty.  The patient was positioned appropriately.  VTE prevention in place.  The patient's abdomen was clipped, prepped, & draped in a sterile fashion.  Surgical timeout confirmed our plan.  The patient was positioned in reverse Trendelenburg.  I confirmed OG was in place.  Abdominal entry was gained using Varies needle.  Entry was clean.  I induced carbon dioxide insufflation.  I placed a 68mm port after the abdomen was insufflated.  Camera inspection revealed some bleeding in the lesser curve and the stomach was distended.  I inspected the stomach in this area carefully.  There were no defects noted.  I had anesthesia reposition the OG and the stomach deflated.  Extra ports were carefully placed under direct laparoscopic visualization.  The robot was docked to the patient's left side and robotic instruments were inserted under visualization.   I reflected the greater omentum and the upper abdomen the small bowel in the upper abdomen. I scored the base of peritoneum of the right side of the mesentery of the left colon from the ligament of Treitz to the peritoneal reflection of the mid rectum.  The patient had no signs of metastatic disease.  I elevated the sigmoid mesentery and enetered into the retro-mesenteric plane. We were able to identify  the left ureter and gonadal vessels. We kept those posterior within the retroperitoneum and elevated the left colon mesentery off that. I did isolated IMA pedicle but did not ligate it yet.  I continued distally and got into the avascular plane posterior to the mesorectum. This allowed me to help mobilize the rectum as well by freeing the mesorectum off the sacrum.  I  mobilized the peritoneal coverings towards the peritoneal reflection on both the right and left sides of the rectum.  I could see the right and left ureters and stayed away from them.    I skeletonized the inferior mesenteric artery pedicle.  I went down to its takeoff from the aorta.   I isolated the inferior mesenteric vein off of the ligament of Treitz just cephalad to that as well.  After confirming the left ureter was out of the way, I went ahead and ligated the inferior mesenteric artery pedicle with bipolar vessel sealer ~2cm above its takeoff from the aorta.   I did ligate the inferior mesenteric vein in a similar fashion.  We ensured hemostasis. I skeletonized the mesorectum at the junction at the proximal rectum using blunt dissection & bipolar vessel sealer.  I mobilized the left colon in a lateral to medial fashion off the line of Toldt up towards the splenic flexure to ensure good mobilization of the left colon to reach into the pelvis.  I then transected the rectosigmoid junction using a blue load robotic stapler.  I divided the proximal mesentery using a vessel sealer.  I then created a Pfannenstiel incision and patient's lower midlineI divided the skin with a 10 blade scalpel.Dissection was carried down through the subcutaneous tissue using electrocautery. The fascia was divided transversely  The peritoneum was divided vertically.A Alexis wound protector was placed. The colon was brought out through the Pfannenstiel incision. The colon was divided proximally to the sigmoid mass. A purse stringer was placed.A 2-0 Prolene pursestring was created.  There was good mucosal bleeding noted.The pursestring suture was secured using 3-0 silk sutures. A 33 mm EEA anvil was placed into the proximal colon and the pursestring was tied tightly.The cap was placed on the wound protector and the abdomen was re-insufflated. An anastomosis was created with the rectal stump and proximal colon without tension under  laparoscopic visualization.  This was tested underwater and showed no signs of leak via insufflation.The pelvis appeared to be hemostatic. The abdomen was irrigated with normal saline.   I then evaluated the stomach once more to rule out any injury from the varies needle. I saw none. There was no active bleeding noted. The ports and retractor was removed. We switched to clean instruments gowns and drapes. The fascia of the 12 mm port was closed using a 0 Vicryl on a UR 6 needle. The peritoneum of the Pfannenstiel incision was closed using a running 2-0 Vicryl suture.The fascia was then closed using #1 PDS running sutures. He subcutaneous tissue was closed using interrupted 2-0 Vicryl sutures. The skin of all incisions was then closed using 4-0 running Vicryl sutures. Dermabond was placed over the port sites and a sterile dressing was placed over the extraction site. The patient was awakened from anesthesia and sent to the postanesthesia care unit in stable condition. All counts were correct per operating room staff.

## 2014-05-17 NOTE — Anesthesia Postprocedure Evaluation (Signed)
  Anesthesia Post-op Note  Patient: Heidi Marquez  Procedure(s) Performed: Procedure(s): XI ROBOT ASSISTED LAPAROSCOPIC SIGMOIDECTOMY (N/A)  Patient Location: PACU  Anesthesia Type:General  Level of Consciousness: awake, alert  and oriented  Airway and Oxygen Therapy: Patient Spontanous Breathing and Patient connected to face mask oxygen  Post-op Pain: none  Post-op Assessment: Post-op Vital signs reviewed  Post-op Vital Signs: Reviewed and stable  Last Vitals:  Filed Vitals:   05/17/14 0545  BP: 179/72  Pulse: 64  Temp: 36.4 C  Resp: 18    Complications: No apparent anesthesia complications

## 2014-05-17 NOTE — Transfer of Care (Signed)
Immediate Anesthesia Transfer of Care Note  Patient: Heidi Marquez  Procedure(s) Performed: Procedure(s): XI ROBOT ASSISTED LAPAROSCOPIC SIGMOIDECTOMY (N/A)  Patient Location: PACU  Anesthesia Type:General  Level of Consciousness: awake, alert  and oriented  Airway & Oxygen Therapy: Patient Spontanous Breathing  Post-op Assessment: Report given to RN  Post vital signs: Reviewed and stable  Last Vitals:  Filed Vitals:   05/17/14 0545  BP: 179/72  Pulse: 64  Temp: 36.4 C  Resp: 18    Complications: No apparent anesthesia complications

## 2014-05-17 NOTE — H&P (Signed)
Jacques Earthly DOB: April 16, 1933 Married / Language: Cleophus Molt / Race: White Female History of Present Illness Leighton Ruff MD; 09/12/1912 12:14 PM) Patient words: f/u CT scan.  The patient is a 81 year old female who presents with a colonic mass. This is a 81 year old female who presents to the office with a colon mass. She was sent to Dr. Carlean Purl for evaluation due to changes in her bowel habits and several guaiac positive stools. Dr. Carlean Purl performed a colonoscopy which showed a mass in her sigmoid colon that was not traversable with the scope. Biopsies were taken and the area was tattooed. CT scans of the chest abdomen and pelvis show a sigmoid colon mass and some stable lung nodules. There are a couple abdominal nodules noted in the abdomen that are too small to characterize. Her CEA level is elevated at 11.7. She is here today to discuss surgery. She has no other major medical problems. Past surgical history is significant for a right-sided inguinal hernia repair and a partial hysterectomy. She is the sole caregiver of her husband who has dementia. She denies any chest pain or shortness of breath with exertion. Other Problems Sharman Crate, LPN; 08/20/2954 2:13 AM) Back Pain  Allergies Clarise Cruz Willey, LPN; 0/09/6576 4:69 AM) Penicillins  Medication History Sharman Crate, LPN; 07/15/9526 4:13 AM) Claritin (10MG  Capsule, Oral) Active. Boniva (2.5MG  Tablet, Oral) Active. (pt unsure of dose) Medications Reconciled  Social History Sharman Crate, LPN; 03/18/4008 2:72 AM) No alcohol use No caffeine use No drug use Tobacco use Never smoker.  Family History Sharman Crate, LPN; 06/15/6642 0:34 AM) Colon Cancer Family Members In General. Hypertension Son.  Pregnancy / Birth History Sharman Crate, LPN; 08/15/2593 6:38 AM) Age at menarche 10 years. Age of menopause 60-55 Gravida 2 Maternal age 87-20 Para 2     Review of Systems Clarise Cruz [facility] LPN; 08/17/6431 2:95  AM) Gastrointestinal Present- Bloody Stool and Difficulty Swallowing. Not Present- Abdominal Pain, Bloating, Change in Bowel Habits, Chronic diarrhea, Constipation, Excessive gas, Gets full quickly at meals, Hemorrhoids, Indigestion, Nausea, Rectal Pain and Vomiting. Endocrine Present- Hair Changes. Not Present- Cold Intolerance, Excessive Hunger, Heat Intolerance, Hot flashes and New Diabetes.  Vitals Clarise Cruz [facility] LPN; 02/19/8414 6:06 AM) 04/13/2014 9:56 AM Weight: 152.13 lb Height: 63in Body Surface Area: 1.75 m Body Mass Index: 26.95 kg/m Temp.: 98.55F(Oral)  Pulse: 59 (Regular)  BP: 132/86 (Sitting, Left Arm, Standard)     Physical Exam Leighton Ruff MD; 3/0/1601 12:15 PM)  General Mental Status-Alert. General Appearance-Consistent with stated age. Hydration-Well hydrated. Voice-Normal.  Head and Neck Head-normocephalic, atraumatic with no lesions or palpable masses. Trachea-midline. Thyroid Gland Characteristics - normal size and consistency.  Eye Eyeball - Bilateral-Extraocular movements intact. Sclera/Conjunctiva - Bilateral-No scleral icterus.  Chest and Lung Exam Chest and lung exam reveals -quiet, even and easy respiratory effort with no use of accessory muscles and on auscultation, normal breath sounds, no adventitious sounds and normal vocal resonance. Inspection Chest Wall - Normal. Back - normal.  Cardiovascular Cardiovascular examination reveals -normal heart sounds, regular rate and rhythm with no murmurs and normal pedal pulses bilaterally.  Abdomen Inspection Inspection of the abdomen reveals - No Hernias. Skin - Scar - Right Lower Quadrant. Palpation/Percussion Palpation and Percussion of the abdomen reveal - Soft, Non Tender, No Rebound tenderness, No Rigidity (guarding) and No hepatosplenomegaly. Auscultation Auscultation of the abdomen reveals - Bowel sounds normal.  Neurologic Neurologic evaluation reveals  -alert and oriented x 3 with no impairment of recent or remote memory. Mental Status-Normal.  Musculoskeletal Normal Exam - Left-Upper Extremity Strength Normal and Lower Extremity Strength Normal. Normal Exam - Right-Upper Extremity Strength Normal and Lower Extremity Strength Normal.    Assessment & Plan Leighton Ruff MD; 02/20/7586 12:16 PM)  MASS OF COLON (569.89  K63.85) Story: 81 year old female with what appears to be a sigmoid colon cancer. We will follow up with the pathology report from her colonoscopy. I have recommended that she undergo a robotic-assisted partial colectomy. I think she would do well with this and primary anastomosis, given her very independent living status. Her metastatic workup has been been completed. There are no obvious signs of metastatic disease. Her CT shows a mass of the sigmoid colon that lies next to the pelvic sidewall. I can follow the LEFT ureter all the way d not see any signs ofinvasion of the ureter. There appears to be a good fat plane in between thetumor and the ureter. Impression: The surgery and anatomy were described to the patient as well as the risks of surgery and the possible complications. These include: Bleeding, deep abdominal infections and possible wound complications such as hernia and infection, damage to adjacent structures, leak of surgical connections, which can lead to other surgeries and possibly an ostomy, possible need for other procedures, such as abscess drains in radiology, possible prolonged hospital stay, possible diarrhea from removal of part of the colon, possible constipation from narcotics, possible bowel, bladder or sexual dysfunction if having rectal surgery, prolonged fatigue/weakness or appetite loss, possible early recurrence of of disease, possible complications of their medical problems such as heart disease or arrhythmias or lung problems, death (less than 1%). I believe the patient understands and wishes to  proceed with the surgery.

## 2014-05-17 NOTE — Anesthesia Postprocedure Evaluation (Signed)
  Anesthesia Post-op Note  Patient: Heidi Marquez  Procedure(s) Performed: Procedure(s) (LRB): XI ROBOT ASSISTED LAPAROSCOPIC SIGMOIDECTOMY (N/A)  Patient Location: PACU  Anesthesia Type: general  Level of Consciousness: awake and alert   Airway and Oxygen Therapy: Patient Spontanous Breathing  Post-op Pain: mild  Post-op Assessment: Post-op Vital signs reviewed, Patient's Cardiovascular Status Stable, Respiratory Function Stable, Patent Airway and No signs of Nausea or vomiting  Last Vitals:  Filed Vitals:   05/17/14 1720  BP: 168/82  Pulse: 94  Temp: 37.2 C  Resp: 18    Post-op Vital Signs: stable   Complications: No apparent anesthesia complications

## 2014-05-18 LAB — CBC
HEMATOCRIT: 32.2 % — AB (ref 36.0–46.0)
HEMOGLOBIN: 10.5 g/dL — AB (ref 12.0–15.0)
MCH: 29.6 pg (ref 26.0–34.0)
MCHC: 32.6 g/dL (ref 30.0–36.0)
MCV: 90.7 fL (ref 78.0–100.0)
Platelets: 160 10*3/uL (ref 150–400)
RBC: 3.55 MIL/uL — ABNORMAL LOW (ref 3.87–5.11)
RDW: 14.3 % (ref 11.5–15.5)
WBC: 12 10*3/uL — ABNORMAL HIGH (ref 4.0–10.5)

## 2014-05-18 LAB — BASIC METABOLIC PANEL
ANION GAP: 7 (ref 5–15)
BUN: 10 mg/dL (ref 6–23)
CALCIUM: 7.8 mg/dL — AB (ref 8.4–10.5)
CO2: 29 mmol/L (ref 19–32)
Chloride: 103 mmol/L (ref 96–112)
Creatinine, Ser: 0.84 mg/dL (ref 0.50–1.10)
GFR calc Af Amer: 75 mL/min — ABNORMAL LOW (ref >90)
GFR calc non Af Amer: 65 mL/min — ABNORMAL LOW (ref >90)
GLUCOSE: 108 mg/dL — AB (ref 70–99)
Potassium: 3.2 mmol/L — ABNORMAL LOW (ref 3.5–5.1)
Sodium: 139 mmol/L (ref 135–145)

## 2014-05-18 MED ORDER — KCL IN DEXTROSE-NACL 20-5-0.45 MEQ/L-%-% IV SOLN: INTRAVENOUS | Status: DC

## 2014-05-18 MED ORDER — KCL IN DEXTROSE-NACL 20-5-0.45 MEQ/L-%-% IV SOLN
INTRAVENOUS | Status: DC
  Administered 2014-05-18: 10:00:00 via INTRAVENOUS
  Filled 2014-05-18 (×2): qty 1000

## 2014-05-18 NOTE — Progress Notes (Signed)
1 Day Post-Op Robotic assisted sigmoidectomy Subjective: Pt doing well.  No nausea.  Pain controlled  Objective: Vital signs in last 24 hours: Temp:  [96.8 F (36 C)-99.6 F (37.6 C)] 98.8 F (37.1 C) (04/05 0540) Pulse Rate:  [60-97] 87 (04/05 0540) Resp:  [8-18] 18 (04/05 0540) BP: (105-168)/(55-82) 137/55 mmHg (04/05 0540) SpO2:  [97 %-100 %] 97 % (04/05 0540)   Intake/Output from previous day: 04/04 0701 - 04/05 0700 In: 3356.3 [I.V.:3356.3] Out: 2375 [Urine:2275; Blood:100] Intake/Output this shift:     General appearance: alert and cooperative GI: normal findings: soft  Incision: no significant drainage, no significant erythema  Lab Results:   Recent Labs  05/18/14 0525  WBC 12.0*  HGB 10.5*  HCT 32.2*  PLT 160   BMET  Recent Labs  05/18/14 0525  NA 139  K 3.2*  CL 103  CO2 29  GLUCOSE 108*  BUN 10  CREATININE 0.84  CALCIUM 7.8*   PT/INR No results for input(s): LABPROT, INR in the last 72 hours. ABG No results for input(s): PHART, HCO3 in the last 72 hours.  Invalid input(s): PCO2, PO2  MEDS, Scheduled . acetaminophen  1,000 mg Oral Q6H  . antiseptic oral rinse  7 mL Mouth Rinse q12n4p  . chlorhexidine  15 mL Mouth Rinse BID  . enoxaparin (LOVENOX) injection  40 mg Subcutaneous Q24H  . loratadine  10 mg Oral Daily    Studies/Results: No results found.  Assessment: s/p Procedure(s): XI ROBOT ASSISTED LAPAROSCOPIC SIGMOIDECTOMY Patient Active Problem List   Diagnosis Date Noted  . Colon cancer 05/17/2014  . Internal hemorrhoids with complication - Grade 3 prolapse 04/07/2014  . Rectocele 04/07/2014  . Pelvic floor dysfunction suspected  04/07/2014  . Nodule of right lung 10/11/2012  . Rectal bleeding 01/24/2012  . Orthostatic hypotension 01/24/2012  . Diverticulosis of colon with hemorrhage 01/24/2012  . ESOPHAGEAL STRICTURE 12/29/2007  . GERD 12/29/2007    Doing well  Plan: Advance diet to clears Minimize IV fluids OOB  and ambulate today   LOS: 1 day     .Rosario Adie, MD [facility], [place]   05/18/2014 9:07 AM

## 2014-05-18 NOTE — Evaluation (Signed)
Physical Therapy Evaluation Patient Details Name: Heidi Marquez MRN: [id number] DOB: 14-Dec-1933 Today's Date: 05/18/2014   History of Present Illness  Pt admitted for Robotic assisted sigmoidectomy due to colon CA.  Clinical Impression  Pt admitted with above diagnosis. Pt currently with functional limitations due to the deficits listed below (see PT Problem List). Pt will benefit from skilled PT to increase their independence and safety with mobility to allow discharge to the venue listed below.  Pt moving well for POD #1 with RW.  Pt does not feel she will need HHPT and will continue to assess.    Follow Up Recommendations No PT follow up    Equipment Recommendations  None recommended by PT    Recommendations for Other Services       Precautions / Restrictions Precautions Precautions: None Precaution Comments: Abd surgery      Mobility  Bed Mobility Overal bed mobility: Needs Assistance Bed Mobility: Rolling;Sidelying to Sit Rolling: Supervision Sidelying to sit: Supervision       General bed mobility comments: instructions for proper body mechanics  Transfers Overall transfer level: Needs assistance Equipment used: Rolling walker (2 wheeled) Transfers: Sit to/from Stand Sit to Stand: Supervision         General transfer comment: Pt with some initial lightheadedness upon standing  Ambulation/Gait Ambulation/Gait assistance: Min guard Ambulation Distance (Feet): 40 Feet Assistive device: Rolling walker (2 wheeled) Gait Pattern/deviations: Step-through pattern Gait velocity: decreased   General Gait Details: Pt moving well with RW with decreased gait speed.  Stairs            Wheelchair Mobility    Modified Rankin (Stroke Patients Only)       Balance Overall balance assessment: Needs assistance Sitting-balance support: Feet supported;No upper extremity supported Sitting balance-Leahy Scale: Good     Standing balance support: Bilateral  upper extremity supported;During functional activity Standing balance-Leahy Scale: Good                               Pertinent Vitals/Pain Pain Assessment: 0-10 Pain Score: 4  Pain Location: headache and abd Pain Descriptors / Indicators: Aching Pain Intervention(s): RN gave pain meds during session;Patient requesting pain meds-RN notified    Home Living Family/patient expects to be discharged to:: Private residence Living Arrangements: Spouse/significant other (pt is primary caregiver for husband) Available Help at Discharge: Family;Available PRN/intermittently Type of Home: House Home Access: Stairs to enter Entrance Stairs-Rails: Right Entrance Stairs-Number of Steps: 2 Home Layout: One level Home Equipment: Walker - 2 wheels;Cane - single point;Shower seat;Bedside commode Additional Comments: Daughter in town and said she can stay as long as needed.  Sister in law can A.    Prior Function Level of Independence: Independent               Hand Dominance        Extremity/Trunk Assessment   Upper Extremity Assessment: Defer to OT evaluation           Lower Extremity Assessment: Generalized weakness;Overall [facility] for tasks assessed      Cervical / Trunk Assessment: Normal  Communication   Communication: No difficulties  Cognition Arousal/Alertness: Awake/alert Behavior During Therapy: WFL for tasks assessed/performed Overall Cognitive Status: Within Functional Limits for tasks assessed                      General Comments      Exercises  Assessment/Plan    PT Assessment Patient needs continued PT services  PT Diagnosis Difficulty walking   PT Problem List Decreased activity tolerance;Decreased mobility;Decreased strength  PT Treatment Interventions DME instruction;Gait training;Functional mobility training;Therapeutic activities;Patient/family education   PT Goals (Current goals can be found in the Care Plan section)  Acute Rehab PT Goals Patient Stated Goal: to go home PT Goal Formulation: With patient/family Time For Goal Achievement: 05/25/14 Potential to Achieve Goals: Good    Frequency Min 3X/week   Barriers to discharge        Co-evaluation               End of Session   Activity Tolerance: Patient tolerated treatment well Patient left: in chair;with call bell/phone within reach;with family/visitor present Nurse Communication: Mobility status;Patient requests pain meds         Time: 1201-1227 PT Time Calculation (min) (ACUTE ONLY): 26 min   Charges:   PT Evaluation $Initial PT Evaluation Tier I: 1 Procedure PT Treatments $Gait Training: 8-22 mins   PT G Codes:        Harolyn Cocker LUBECK 05/18/2014, 1:07 PM

## 2014-05-18 NOTE — Care Management Note (Signed)
    Page 1 of 1   05/18/2014     11:02:52 AM CARE MANAGEMENT NOTE 05/18/2014  Patient:  Heidi Marquez, Heidi Marquez   Account Number:  [id number]  Date Initiated:  05/18/2014  Documentation initiated by:  Sunday Spillers  Subjective/Objective Assessment:   81 yo female admitted s/p robotic sigmoidectomy. PTA lived at home.     Action/Plan:   Home when stable   Anticipated DC Date:  05/20/2014   Anticipated DC Plan:  [place]  CM consult      Choice offered to / List presented to:             Status of service:  Completed, signed off Medicare Important Message given?   (If response is "NO", the following Medicare IM given date fields will be blank) Date Medicare IM given:   Medicare IM given by:   Date Additional Medicare IM given:   Additional Medicare IM given by:    Discharge Disposition:  HOME/SELF CARE  Per UR Regulation:  Reviewed for med. necessity/level of care/duration of stay  If discussed at [place] of Stay Meetings, dates discussed:    Comments:

## 2014-05-18 NOTE — Progress Notes (Signed)
Pharmacy: Re - Alvimopan (Entereg)  The standing order set for alvimopan (Entereg) now includes an automatic order to discontinue the drug after the patient has had a bowel movement. The change was approved by the [place] and the Medical Executive Committee.   This patient has had a bowel movement documented by nursing (4/5 at [phone]). Therefore, alvimopan has been discontinued. If there are questions, please contact the pharmacy at [phone].   Thank you.  Dia Sitter, PharmD, BCPS 05/18/2014 8:30 AM

## 2014-05-18 NOTE — Evaluation (Signed)
Occupational Therapy Evaluation Patient Details Name: Heidi Marquez MRN: [id number] DOB: 1933/07/16 Today's Date: 05/18/2014    History of Present Illness Pt admitted for Robotic assisted sigmoidectomy due to colon CA.   Clinical Impression   Pt admitted with above.  She is doing very well post op day #1.  She is able to perform LB ADLs with min A due to abdominal discomfort, but should progress quickly to modified independence.  Family is supportive.  Discussed precautions and activity at home as she is the primary caregiver for spouse.  Daughter and extended family will assist. Pt with no further OT needs, all education completed.  Encouraged her to walk with nursing.  Will sign off at this time.     Follow Up Recommendations  No OT follow up;Supervision - Intermittent    Equipment Recommendations  None recommended by OT    Recommendations for Other Services       Precautions / Restrictions Precautions Precautions: None Precaution Comments: Abd surgery      Mobility Bed Mobility Overal bed mobility: Needs Assistance Bed Mobility: Rolling;Sidelying to Sit Rolling: Supervision Sidelying to sit: Supervision       General bed mobility comments: instructions for proper body mechanics  Transfers Overall transfer level: Needs assistance Equipment used: 1 person hand held assist Transfers: Sit to/from Stand;Stand Pivot Transfers Sit to Stand: Supervision Stand pivot transfers: Min guard       General transfer comment: Pt with one LOB as she fatigued     Balance Overall balance assessment: No apparent balance deficits (not formally assessed) Sitting-balance support: Feet supported;No upper extremity supported Sitting balance-Leahy Scale: Good     Standing balance support: Bilateral upper extremity supported;During functional activity Standing balance-Leahy Scale: Good                              ADL Overall ADL's : Needs  assistance/impaired Eating/Feeding: Independent;Sitting   Grooming: Wash/dry hands;Wash/dry face;Brushing hair;Min guard;Standing   Upper Body Bathing: Set up;Sitting   Lower Body Bathing: Minimal assistance;Sit to/from stand   Upper Body Dressing : Set up;Sitting   Lower Body Dressing: Minimal assistance;Sit to/from stand   Toilet Transfer: Min guard;Ambulation;Comfort height toilet;Grab bars   Toileting- Clothing Manipulation and Hygiene: Min guard;Sit to/from stand   Tub/ Shower Transfer: Min guard;Ambulation;Grab bars   Functional mobility during ADLs: Min guard General ADL Comments: Pt requires min A to access feet for LB bathing and dressing due to abdominal discomfort.  She was able to simulate shower in standing with no LOB and simulate stepping over tub with min guard assist.  Discussed pacing self and not over exerting. Discussed with pt and daughter that she will need assist with groceries, laundry, and other IADLs that require lifting     Vision     Perception     Praxis      Pertinent Vitals/Pain Pain Assessment: 0-10 Pain Score: 4  Pain Location: abdomen  Pain Descriptors / Indicators: Aching Pain Intervention(s): Limited activity within patient's tolerance;Monitored during session;Repositioned     Hand Dominance     Extremity/Trunk Assessment Upper Extremity Assessment Upper Extremity Assessment: Overall WFL for tasks assessed   Lower Extremity Assessment Lower Extremity Assessment: Defer to PT evaluation   Cervical / Trunk Assessment Cervical / Trunk Assessment: Normal   Communication Communication Communication: No difficulties   Cognition Arousal/Alertness: Awake/alert Behavior During Therapy: WFL for tasks assessed/performed Overall Cognitive Status: Within Functional Limits for tasks  assessed                     General Comments       Exercises       Shoulder Instructions      Home Living Family/patient expects to be  discharged to:: Private residence Living Arrangements: Spouse/significant other (pt is primary caregiver for husband) Available Help at Discharge: Family;Available PRN/intermittently Type of Home: House Home Access: Stairs to enter [employer] of Steps: 2 Entrance Stairs-Rails: Right Home Layout: One level     Bathroom Shower/Tub: Tub/shower unit;Walk-in shower Shower/tub characteristics: Architectural technologist: Standard     Home Equipment: Environmental consultant - 2 wheels;Cane - single point;Shower seat;Bedside commode   Additional Comments: Daughter in town and said she can stay as long as needed.  Sister in law can A.      Prior Functioning/Environment Level of Independence: Independent        Comments: Pt is caregiver for spouse.  She does not have to provide physical assist, but provides supervision, and all household management     OT Diagnosis: Generalized weakness;Acute pain   OT Problem List:     OT Treatment/Interventions:      OT Goals(Current goals can be found in the care plan section) Acute Rehab OT Goals Patient Stated Goal: to go home OT Goal Formulation: All assessment and education complete, DC therapy  OT Frequency:     Barriers to D/C:            Co-evaluation              End of Session Nurse Communication: Mobility status  Activity Tolerance: Patient tolerated treatment well Patient left: in chair;with call bell/phone within reach;with family/visitor present   Time: 2035-5974 OT Time Calculation (min): 25 min Charges:  OT Evaluation $Initial OT Evaluation Tier I: 1 Procedure OT Treatments $Self Care/Home Management : 8-22 mins G-Codes:    Tania Steinhauser M 06/14/14, 1:32 PM

## 2014-05-18 NOTE — Progress Notes (Signed)
Pt reported having influenza vaccine this season. Unable to document on appropriate flow sheet (documentation cell grayed out).

## 2014-05-19 LAB — CBC
HCT: 32.1 % — ABNORMAL LOW (ref 36.0–46.0)
HEMOGLOBIN: 10.3 g/dL — AB (ref 12.0–15.0)
MCH: 29.3 pg (ref 26.0–34.0)
MCHC: 32.1 g/dL (ref 30.0–36.0)
MCV: 91.5 fL (ref 78.0–100.0)
Platelets: 139 10*3/uL — ABNORMAL LOW (ref 150–400)
RBC: 3.51 MIL/uL — ABNORMAL LOW (ref 3.87–5.11)
RDW: 14.3 % (ref 11.5–15.5)
WBC: 13.5 10*3/uL — ABNORMAL HIGH (ref 4.0–10.5)

## 2014-05-19 LAB — BASIC METABOLIC PANEL
ANION GAP: 6 (ref 5–15)
BUN: 9 mg/dL (ref 6–23)
CALCIUM: 8.1 mg/dL — AB (ref 8.4–10.5)
CO2: 27 mmol/L (ref 19–32)
Chloride: 106 mmol/L (ref 96–112)
Creatinine, Ser: 0.7 mg/dL (ref 0.50–1.10)
GFR, EST NON AFRICAN AMERICAN: 81 mL/min — AB (ref >90)
Glucose, Bld: 104 mg/dL — ABNORMAL HIGH (ref 70–99)
Potassium: 3.7 mmol/L (ref 3.5–5.1)
SODIUM: 139 mmol/L (ref 135–145)

## 2014-05-19 NOTE — Progress Notes (Signed)
2 Days Post-Op Robotic assisted sigmoidectomy Subjective: Pt doing well.  No nausea.  Pain controlled.  Tolerating clears.  Having small BM's.  Ambulating well.  Objective: Vital signs in last 24 hours: Temp:  [97.5 F (36.4 C)-99.7 F (37.6 C)] 98.8 F (37.1 C) (04/06 0600) Pulse Rate:  [80-94] 84 (04/06 0600) Resp:  [16-20] 16 (04/06 0600) BP: (123-172)/(57-77) 168/72 mmHg (04/06 0600) SpO2:  [90 %-98 %] 96 % (04/06 0600)   Intake/Output from previous day: 04/05 0701 - 04/06 0700 In: 1671.3 [P.O.:565; I.V.:1106.3] Out: 1750 [Urine:1750] Intake/Output this shift:    General appearance: alert and cooperative GI: normal findings: soft  Incision: no significant drainage, no significant erythema  Lab Results:   Recent Labs  05/18/14 0525 05/19/14 0450  WBC 12.0* 13.5*  HGB 10.5* 10.3*  HCT 32.2* 32.1*  PLT 160 139*   BMET  Recent Labs  05/18/14 0525 05/19/14 0450  NA 139 139  K 3.2* 3.7  CL 103 106  CO2 29 27  GLUCOSE 108* 104*  BUN 10 9  CREATININE 0.84 0.70  CALCIUM 7.8* 8.1*   PT/INR No results for input(s): LABPROT, INR in the last 72 hours. ABG No results for input(s): PHART, HCO3 in the last 72 hours.  Invalid input(s): PCO2, PO2  MEDS, Scheduled . antiseptic oral rinse  7 mL Mouth Rinse q12n4p  . chlorhexidine  15 mL Mouth Rinse BID  . enoxaparin (LOVENOX) injection  40 mg Subcutaneous Q24H  . loratadine  10 mg Oral Daily    Studies/Results: No results found.  Assessment: s/p Procedure(s): XI ROBOT ASSISTED LAPAROSCOPIC SIGMOIDECTOMY Patient Active Problem List   Diagnosis Date Noted  . Colon cancer 05/17/2014  . Internal hemorrhoids with complication - Grade 3 prolapse 04/07/2014  . Rectocele 04/07/2014  . Pelvic floor dysfunction suspected  04/07/2014  . Nodule of right lung 10/11/2012  . Rectal bleeding 01/24/2012  . Orthostatic hypotension 01/24/2012  . Diverticulosis of colon with hemorrhage 01/24/2012  . ESOPHAGEAL  STRICTURE 12/29/2007  . GERD 12/29/2007    Doing well.  Expected post op course  Plan: Advance diet to soft diet Minimize IV fluids Cont OOB and ambulate today   LOS: 2 days     .Rosario Adie, MD [facility], [place]   05/19/2014 8:50 AM

## 2014-05-20 LAB — CBC
HCT: 34.8 % — ABNORMAL LOW (ref 36.0–46.0)
Hemoglobin: 11.2 g/dL — ABNORMAL LOW (ref 12.0–15.0)
MCH: 29.3 pg (ref 26.0–34.0)
MCHC: 32.2 g/dL (ref 30.0–36.0)
MCV: 91.1 fL (ref 78.0–100.0)
PLATELETS: 168 10*3/uL (ref 150–400)
RBC: 3.82 MIL/uL — ABNORMAL LOW (ref 3.87–5.11)
RDW: 14.4 % (ref 11.5–15.5)
WBC: 10.8 10*3/uL — AB (ref 4.0–10.5)

## 2014-05-20 LAB — BASIC METABOLIC PANEL
ANION GAP: 9 (ref 5–15)
BUN: 11 mg/dL (ref 6–23)
CALCIUM: 8.2 mg/dL — AB (ref 8.4–10.5)
CO2: 24 mmol/L (ref 19–32)
Chloride: 104 mmol/L (ref 96–112)
Creatinine, Ser: 0.67 mg/dL (ref 0.50–1.10)
GFR, EST NON AFRICAN AMERICAN: 82 mL/min — AB (ref >90)
Glucose, Bld: 105 mg/dL — ABNORMAL HIGH (ref 70–99)
Potassium: 3.5 mmol/L (ref 3.5–5.1)
SODIUM: 137 mmol/L (ref 135–145)

## 2014-05-20 MED ORDER — HYDROCODONE-ACETAMINOPHEN 5-325 MG PO TABS: 1.0000 | ORAL_TABLET | ORAL | Status: DC | PRN

## 2014-05-20 NOTE — Discharge Instructions (Signed)

## 2014-05-20 NOTE — Discharge Summary (Signed)
Physician Discharge Summary  Patient ID: Heidi Marquez MRN: [id number] DOB/AGE: Oct 20, 1933 81 y.o.  Admit date: 05/17/2014 Discharge date: 05/20/2014  Admission Diagnoses: Colon mass  Discharge Diagnoses:  Active Problems:   Colon cancer   Discharged Condition: good  Hospital Course: Patient admitted after surgery.  She did well.  Her foley was removed on POD 2.  Her diet was advanced as tolerated.  She was seen by PT and OT with no home needs identified.  By POD 3 she was tolerating a soft diet and having BM's.  Her pain was controlled.  She was felt to be in stable condition for discharge.    Consults: None  Significant Diagnostic Studies: labs: cbc, chemistry  Treatments: IV hydration, analgesia: acetaminophen w/ codeine and surgery: robotic assisted sigmoidectomy  Discharge Exam: Blood pressure 144/83, pulse 100, temperature 100.5 F (38.1 C), temperature source Oral, resp. rate 18, height 5' 4.5" (1.638 m), weight 68.947 kg (152 lb), SpO2 96 %. General appearance: alert and cooperative GI: normal findings: soft, non-tender Incision/Wound: clean, dry  Disposition: 01-Home or Self Care     Medication List    ASK your doctor about these medications        alendronate 70 MG tablet  Commonly known as:  FOSAMAX  Take 70 mg by mouth every 7 (seven) days. Tuesdays. Take with a full glass of water on an empty stomach.     Biotin 1000 MCG tablet  Take 1,000 mcg by mouth daily.     CALCIUM & MAGNESIUM CARBONATES PO  Take 1 tablet by mouth daily.     cholecalciferol 1000 UNITS tablet  Commonly known as:  VITAMIN D  Take 1,000 Units by mouth daily.     [employer] LEG CRAMPS PAIN RELIEF PO  Take 1 tablet by mouth daily as needed (for leg cramps).     glucosamine-chondroitin 500-400 MG tablet  Take 1 tablet by mouth daily.     loratadine 10 MG tablet  Commonly known as:  CLARITIN  Take 10 mg by mouth daily.     multivitamin tablet  Take 1 tablet by mouth daily.     naproxen sodium 220 MG tablet  Commonly known as:  ANAPROX  Take 220 mg by mouth daily.     vitamin B-12 1000 MCG tablet  Commonly known as:  CYANOCOBALAMIN  Take 1,000 mcg by mouth daily.     vitamin C 1000 MG tablet  Take 1,000 mg by mouth daily.           Follow-up Information    Follow up with Rosario Adie., MD. Schedule an appointment as soon as possible for a visit in 2 weeks.   Specialty:  General Surgery   Contact information:   [address] [place] [address] [phone]       Signed: Rosario Adie 03/21/621, 76:28 AM

## 2014-05-25 ENCOUNTER — Encounter (HOSPITAL_COMMUNITY): Payer: Self-pay

## 2014-06-02 ENCOUNTER — Telehealth: Payer: Self-pay | Admitting: Hematology

## 2014-06-02 NOTE — Telephone Encounter (Signed)
NEW PATIENT REFERRAL-LEFT MESSAGE FOR PATIENT TO RETURN CALL TO SCHEDULE NP APPT °

## 2014-06-04 ENCOUNTER — Telehealth: Payer: Self-pay | Admitting: *Deleted

## 2014-06-04 NOTE — Telephone Encounter (Signed)
Confirmed new patient appointment with her for 06/08/14 at 1:45 with Dr. Burr Medico.

## 2014-06-08 ENCOUNTER — Telehealth: Payer: Self-pay | Admitting: Hematology

## 2014-06-08 ENCOUNTER — Encounter: Payer: Self-pay | Admitting: Hematology

## 2014-06-08 ENCOUNTER — Other Ambulatory Visit (HOSPITAL_BASED_OUTPATIENT_CLINIC_OR_DEPARTMENT_OTHER): Payer: Commercial Managed Care - HMO

## 2014-06-08 ENCOUNTER — Ambulatory Visit: Payer: Commercial Managed Care - HMO

## 2014-06-08 ENCOUNTER — Ambulatory Visit (HOSPITAL_BASED_OUTPATIENT_CLINIC_OR_DEPARTMENT_OTHER): Payer: Commercial Managed Care - HMO | Admitting: Hematology

## 2014-06-08 VITALS — BP 149/85 | HR 94 | Temp 98.3°F | Resp 18 | Ht 64.5 in | Wt 147.4 lb

## 2014-06-08 DIAGNOSIS — C187 Malignant neoplasm of sigmoid colon: Secondary | ICD-10-CM

## 2014-06-08 DIAGNOSIS — C189 Malignant neoplasm of colon, unspecified: Secondary | ICD-10-CM

## 2014-06-08 DIAGNOSIS — R918 Other nonspecific abnormal finding of lung field: Secondary | ICD-10-CM

## 2014-06-08 LAB — COMPREHENSIVE METABOLIC PANEL (CC13)
ALT: 12 U/L (ref 0–55)
AST: 22 U/L (ref 5–34)
Albumin: 3.5 g/dL (ref 3.5–5.0)
Alkaline Phosphatase: 81 U/L (ref 40–150)
Anion Gap: 12 mEq/L — ABNORMAL HIGH (ref 3–11)
BILIRUBIN TOTAL: 0.41 mg/dL (ref 0.20–1.20)
BUN: 19.3 mg/dL (ref 7.0–26.0)
CHLORIDE: 108 meq/L (ref 98–109)
CO2: 25 meq/L (ref 22–29)
CREATININE: 1.1 mg/dL (ref 0.6–1.1)
Calcium: 9.3 mg/dL (ref 8.4–10.4)
EGFR: 46 mL/min/{1.73_m2} — ABNORMAL LOW (ref >90)
GLUCOSE: 112 mg/dL (ref 70–140)
POTASSIUM: 4 meq/L (ref 3.5–5.1)
Sodium: 145 mEq/L (ref 136–145)
Total Protein: 6.3 g/dL — ABNORMAL LOW (ref 6.4–8.3)

## 2014-06-08 LAB — CBC & DIFF AND RETIC
BASO%: 0.5 % (ref 0.0–2.0)
BASOS ABS: 0 10*3/uL (ref 0.0–0.1)
EOS ABS: 0.3 10*3/uL (ref 0.0–0.5)
EOS%: 4.7 % (ref 0.0–7.0)
HEMATOCRIT: 30.9 % — AB (ref 34.8–46.6)
HEMOGLOBIN: 10.1 g/dL — AB (ref 11.6–15.9)
Immature Retic Fract: 7 % (ref 1.60–10.00)
LYMPH%: 19.7 % (ref 14.0–49.7)
MCH: 29.8 pg (ref 25.1–34.0)
MCHC: 32.7 g/dL (ref 31.5–36.0)
MCV: 91.2 fL (ref 79.5–101.0)
MONO#: 0.5 10*3/uL (ref 0.1–0.9)
MONO%: 8.4 % (ref 0.0–14.0)
NEUT#: 4 10*3/uL (ref 1.5–6.5)
NEUT%: 66.7 % (ref 38.4–76.8)
Platelets: 196 10*3/uL (ref 145–400)
RBC: 3.39 10*6/uL — AB (ref 3.70–5.45)
RDW: 14.4 % (ref 11.2–14.5)
RETIC %: 1.36 % (ref 0.70–2.10)
Retic Ct Abs: 46.1 10*3/uL (ref 33.70–90.70)
WBC: 5.9 10*3/uL (ref 3.9–10.3)
lymph#: 1.2 10*3/uL (ref 0.9–3.3)

## 2014-06-08 LAB — CEA: CEA: 16 ng/mL — ABNORMAL HIGH (ref 0.0–5.0)

## 2014-06-08 NOTE — Progress Notes (Signed)
Checked in new pt with no financial concerns prior to seeing the dr.  Pt has my card for any billing questions or concerns. ° °

## 2014-06-08 NOTE — Progress Notes (Signed)
- [facility]  [phone] [phone]  Clinic New Consult Note   Patient Care Team: Ravisankar Avva, MD as PCP - General (Internal Medicine) 06/08/2014  CHIEF COMPLAINTS/PURPOSE OF CONSULTATION:  Stage II colon cancer    Colon cancer   04/09/2014 Procedure Colonoscopy per Dr. Carl Gessner: Large fleshy mass sigmoid colon 25 cm from anus. Could not pass through.   04/09/2014 Tumor Marker CEA =11.7   04/12/2014 Imaging CT C/A/P:sigmoid colon mass;several small areas of soft tissue nodularity in peritoneal cavity; small attenuation structure posterior right hepatic lobe; 2 stable nodules in lungs dating back to 2014. No acute findings.   05/17/2014 Initial Diagnosis Colon cancer   05/17/2014 Surgery Robot assisted sigmoidectomy   05/17/2014 Pathologic Stage pT3,pN0,pMX--Grade 1--0/13 nodes +--MMR normal   05/17/2014 Clinical Stage Stage II    HISTORY OF PRESENTING ILLNESS:  Heidi Marquez 81 y.o. female is here because of recently diagnosed a stage II sigmoid colon cancer.  She had annual physical with her PCP in early Feb this year, and stool OB (+), no overt GI bleeding, CBC was normal. She had noticed some loose BM before that, but no nausea, loss of appetite, weight loss or other symoptoms. Colonoscopy showed a large mass in the sigmoid colon 25 cm from anus. Biopsy was taken which showed tubulovillous adenoma, no malignancy. She was referred to surgeon Dr. Thomas and underwent robotic-assisted sigmoidectomy on 4/4. She tolerated the procedure well, and was discharged home afterwards.  She has recovered well from the surgery 3 weeks ago, she has mild fatigue, but otherwise doing very well without other complains. She had 2-3 episodes of bleeding after the surgery, which was related to her herromods. She is eating well. 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   She has chronic knee pain she takes ibuprofen once  daily.    MEDICAL HISTORY:  Past Medical History  Diagnosis Date  . Diverticulosis of colon     severe in sigmoid but diffusley throughout colon  on 2009 colonoscopy  . Internal hemorrhoids   . Hematochezia 2009  . Benign esophageal stricture 04/2007    EGD with dilatation.  . GERD (gastroesophageal reflux disease)   . HH (hiatus hernia)   . Macular degeneration   . Arthritis   . IBS (irritable bowel syndrome)   . Epistaxis ~ 2005    required cauterization twice.   . Reflux esophagitis   . Allergy   . Cataract     ight eye starting  . Dysrhythmia     patient states for last year, when coming up steps , has palpitations-heart beats fast    SURGICAL HISTORY: Past Surgical History  Procedure Laterality Date  . Abdominal hysterectomy      partial with the bladder tack  . Bladder tack    . Retinal laser surgery    . Inguinal hernia repair  early 1990s    right  . Basal cell resection  08/2011, 11/2011    initially biopsy 08/3011.  repeat excision right nose with grafting from the skin behind the right ear  . Colonoscopy    . Esophagogastroduodenoscopy    . Upper gastrointestinal endoscopy    . Appendectomy      patient states does not know about that    SOCIAL HISTORY: History   Social History  . Marital Status: Married    Spouse Name: N/A  . Number of Children: N/A  . Years   of Education: N/A   Occupational History  . Not on file.   Social History Main Topics  . Smoking status: Passive Smoke Exposure - Never Smoker  . Smokeless tobacco: Never Used  . Alcohol Use: No  . Drug Use: No  . Sexual Activity: Not on file   Other Topics Concern  . Not on file   Social History Narrative    FAMILY HISTORY: Family History  Problem Relation Age of Onset  . Colon cancer Paternal Grandfather   . Esophageal cancer Neg Hx   . Rectal cancer Neg Hx   . Stomach cancer Neg Hx     ALLERGIES:  is allergic to penicillins; sulfonamide derivatives; and amoxicillin-pot  clavulanate.  MEDICATIONS:  Current Outpatient Prescriptions  Medication Sig Dispense Refill  . alendronate (FOSAMAX) 70 MG tablet Take 70 mg by mouth every 7 (seven) days. Tuesdays. Take with a full glass of water on an empty stomach.    . Ascorbic Acid (VITAMIN C) 1000 MG tablet Take 1,000 mg by mouth daily.    . Biotin 1000 MCG tablet Take 1,000 mcg by mouth daily.     Marland Kitchen CALCIUM & MAGNESIUM CARBONATES PO Take 1 tablet by mouth daily.    . cholecalciferol (VITAMIN D) 1000 UNITS tablet Take 1,000 Units by mouth daily.    Marland Kitchen glucosamine-chondroitin 500-400 MG tablet Take 1 tablet by mouth daily.     . Homeopathic Products ([employer] LEG CRAMPS PAIN RELIEF PO) Take 1 tablet by mouth daily as needed (for leg cramps).    Marland Kitchen HYDROcodone-acetaminophen (NORCO/VICODIN) 5-325 MG per tablet Take 1-2 tablets by mouth every 4 (four) hours as needed for moderate pain. 30 tablet 0  . loratadine (CLARITIN) 10 MG tablet Take 10 mg by mouth daily.    . Multiple Vitamin (MULTIVITAMIN) tablet Take 1 tablet by mouth daily.    . naproxen sodium (ANAPROX) 220 MG tablet Take 220 mg by mouth daily.     . vitamin B-12 (CYANOCOBALAMIN) 1000 MCG tablet Take 1,000 mcg by mouth daily.      No current facility-administered medications for this visit.    REVIEW OF SYSTEMS:   Constitutional: Denies fevers, chills or abnormal night sweats Eyes: Denies blurriness of vision, double vision or watery eyes Ears, nose, mouth, throat, and face: Denies mucositis or sore throat Respiratory: Denies cough, dyspnea or wheezes Cardiovascular: Denies palpitation, chest discomfort or lower extremity swelling Gastrointestinal:  Denies nausea, heartburn or change in bowel habits Skin: Denies abnormal skin rashes Lymphatics: Denies new lymphadenopathy or easy bruising Neurological:Denies numbness, tingling or new weaknesses Behavioral/Psych: Mood is stable, no new changes  All other systems were reviewed with the patient and are  negative.  PHYSICAL EXAMINATION: ECOG PERFORMANCE STATUS: 0 - Asymptomatic  Filed Vitals:   06/08/14 1354  BP: 149/85  Pulse: 94  Temp: 98.3 F (36.8 C)  Resp: 18   Filed Weights   06/08/14 1354  Weight: 147 lb 6.4 oz (66.86 kg)    GENERAL:alert, no distress and comfortable SKIN: skin color, texture, turgor are normal, no rashes or significant lesions EYES: normal, conjunctiva are pink and non-injected, sclera clear OROPHARYNX:no exudate, no erythema and lips, buccal mucosa, and tongue normal  NECK: supple, thyroid normal size, non-tender, without nodularity LYMPH:  no palpable lymphadenopathy in the cervical, axillary or inguinal LUNGS: clear to auscultation and percussion with normal breathing effort HEART: regular rate & rhythm and no murmurs and no lower extremity edema ABDOMEN:abdomen soft, non-tender and normal bowel sounds Musculoskeletal:no cyanosis  of digits and no clubbing  PSYCH: alert & oriented x 3 with fluent speech NEURO: no focal motor/sensory deficits  LABORATORY DATA:  I have reviewed the data as listed Lab Results  Component Value Date   WBC 10.8* 05/20/2014   HGB 11.2* 05/20/2014   HCT 34.8* 05/20/2014   MCV 91.1 05/20/2014   PLT 168 05/20/2014    Recent Labs  04/09/14 1507  05/18/14 0525 05/19/14 0450 05/20/14 0505  NA 140  < > 139 139 137  K 4.1  < > 3.2* 3.7 3.5  CL 105  < > 103 106 104  CO2 32  < > _0 GLUCOSE 93  < > 108* 104* 105*  BUN 15  < > _1 CREATININE 0.92  < > 0.84 0.70 0.67  CALCIUM 9.0  < > 7.8* 8.1* 8.2*  GFRNONAA  --   < > 65* 81* 82*  GFRAA  --   < > 75* >90 >90  PROT 6.7  --   --   --   --   ALBUMIN 4.1  --   --   --   --   AST 22  --   --   --   --   ALT 14  --   --   --   --   ALKPHOS 98  --   --   --   --   BILITOT 0.7  --   --   --   --   < > = values in this interval not displayed.  Pathology report: Diagnosis 1. Colon, segmental resection for tumor, sigmoid, open end proximal - INVASIVE  ADENOCARCINOMA WITH EXTRACELLULAR MUCIN, INVADING THROUGH THE MUSCULARIS PROPRIA INTO PERICOLONIC FATTY TISSUE. - THIRTEEN LYMPH NODES, NEGATIVE FOR METASTATIC CARCINOMA (0/13). - MULTIPLE DIVERTICULA. - RESECTION MARGINS, NEGATIVE FOR ATYPIA OR MALIGNANCY. - PLEASE SEE ONCOLOGY TEMPLATE FOR DETAILS. 2. Colon, resection margin (donut), sigmoid - BENIGN COLONIC MUCOSA, NO EVIDENCE OF DYSPLASIA OR MALIGNANCY. Specimen: Left colon. Procedure: Segmental resection. Tumor site: Sigmoid colon. Specimen integrity: Intact. Macroscopic intactness of mesorectum: N/A Macroscopic tumor perforation: No Invasive tumor: Invasive adenocarcinoma with extracellular mucin. Maximum size: 8.0 cm, gross measurement. Histologic type(s): Invasive adenocarcinoma with extracellular mucin. Histologic grade and differentiation: G1: well differentiated/low grade Type of polyp in which invasive carcinoma arose: Villous adenoma. Microscopic extension of invasive tumor: Invading through the muscularis propria into pericolonic fatty tissue. Lymph-Vascular invasion: Not identified. Peri-neural invasion: Not identified. Tumor deposit(s) (discontinuous extramural extension): None. Resection margins: Negative. Proximal margin: 9.6 cm. Distal margin: 9.6 cm. Mesenteric margin (sigmoid and transverse): 6 cm. Treatment effect (neo-adjuvant therapy): No Additional polyp(s): N/A Non-neoplastic findings: Prominent Crohn-like reaction around the tumor. Lymph nodes: number examined 13; number positive: 0 Pathologic Staging: pT3, pN0, pMX  MMR: NORMAL MSI: STABLE  RADIOGRAPHIC STUDIES: I have personally reviewed the radiological images as listed and agreed with the findings in the report.  CT chest, abdomen and pelvis with contrast every 20 11/02/2014 IMPRESSION: 1. No acute findings within the chest abdomen or pelvis. 2. There are 2 pulmonary nodules within the right lung which are stable dating back to 12/31/2012. 3.  Sigmoid colon mass. 4. There are several very subtle areas of soft tissue nodularity within the peritoneal cavity. Peritoneal metastasis cannot be excluded and careful interval followup of these lesions is recommended. 5. Single small low attenuation structure within the posterior right hepatic lobe is identified. This is too small to characterize and is indeterminate.  6. Persistent nodule in left lobe of thyroid gland. Consider further evaluation with thyroid ultrasound. If patient is clinically hyperthyroid, consider nuclear medicine thyroid uptake and scan.  ASSESSMENT & PLAN:  81 year old Caucasian female, with past medical history of colon diverticulosis and arthritis, but otherwise healthy and active, who was found to have a large sigmoid tumor, status post sigmoidectomy.  1. Sigmoid colon adenocarcinoma, pT3N0M0, stage II, G1, MSI stable  -I reviewed her surgical pathology findings, and her pre-op CT scan findings with her in great details. -we discussed that this is likely cured by the second complete surgical resection, however she still has a risk of cancer recurrence, which is every 20% for stage II colon cancer -her cancer does not have high risks features, such as T4 lesion, perforation, poor differentiation, lymphovascular invasion or perineural  Invasion, so I do not recommend adjuvant chemotherapy. The benefit of adjuvant chemotherapy in stage II colon cancer without high-risk features are uncertain. -We further discussed the surveillance plan, which includes physical exam and lab ( CBC, CMP and CEA) every 3 months for 2 years then every 6 months for additional 3 years. I'll obtain a CT chest abdomen and pelvis in 6 months, then once every year. She would need a repeated a colonoscopy in one year -She voiced good understanding about the above, and agreed to proceed -We also discussed the genetic syndrome of colon cancer. Her tumor has MSI stable unlikely she has Lynch syndrome.  However I strongly recommend her daughter and brother to be screened for colon cancer by colonoscopy  2. Lung nodules -She has two small right lung nodules which were discovered on the CT scan, stable since 01/2013  -she never smoked, low risk for lung cancer -We will follow up on subsequent CT scan  Plan -lab today -RTC in 3 month and lab    All questions were answered. The patient knows to call the clinic with any problems, questions or concerns. I spent 40 minutes counseling the patient face to face. The total time spent in the appointment was 55 minutes and more than 50% was on counseling.     Truitt Merle, MD 06/08/2014 2:08 PM

## 2014-06-08 NOTE — Telephone Encounter (Signed)
Gave avs & calendar for July. Sent patient to have labs drawn. No pof sent, spoke with MD to confirmed revisit.

## 2014-06-08 NOTE — [facility] Oncology Navigator Note (Signed)
Met with patient during new patient visit. Explained the role of the GI Nurse Navigator and provided New Patient Packet with information on: 1.  Colon cancer 2. Support groups 3. Advanced Directives 4. Fall Safety Plan Answered questions, reviewed current treatment plan using TEACH back and provided emotional support. Provided copy of current treatment plan. No referrals required or barriers to care at this time.   Merceda Elks, RN, BSN GI Oncology [place]

## 2014-06-10 ENCOUNTER — Telehealth: Payer: Self-pay | Admitting: Hematology

## 2014-06-10 NOTE — Telephone Encounter (Signed)
I called the patient and left a message on your home phone. His CEA still elevated, I'll repeat it in 3 months, and consider her earlier restaging scan if her CEA remains to be elevated in 3 months. I asked her to call us back if she has any concerns.  Truitt Merle  06/10/2014

## 2014-06-16 ENCOUNTER — Telehealth: Payer: Self-pay | Admitting: Hematology

## 2014-06-16 NOTE — Telephone Encounter (Signed)
per vm left by pt she wanted a r/s appt on a Tues-cld pt and left time & date of r/s appt

## 2014-06-21 ENCOUNTER — Telehealth: Payer: Self-pay | Admitting: *Deleted

## 2014-06-21 NOTE — Telephone Encounter (Signed)
Left VM for patient to return call for any questions or concerns she may have from her new patient appointment last week. Left contact information.

## 2014-07-14 ENCOUNTER — Other Ambulatory Visit: Payer: Self-pay | Admitting: Surgery

## 2014-07-14 DIAGNOSIS — R911 Solitary pulmonary nodule: Secondary | ICD-10-CM

## 2014-07-26 ENCOUNTER — Other Ambulatory Visit (HOSPITAL_COMMUNITY): Payer: Self-pay | Admitting: Internal Medicine

## 2014-07-26 DIAGNOSIS — Z1231 Encounter for screening mammogram for malignant neoplasm of breast: Secondary | ICD-10-CM

## 2014-07-28 ENCOUNTER — Ambulatory Visit: Payer: Commercial Managed Care - HMO | Admitting: Surgery

## 2014-07-28 ENCOUNTER — Inpatient Hospital Stay: Admission: RE | Admit: 2014-07-28 | Payer: Commercial Managed Care - HMO | Source: Ambulatory Visit

## 2014-08-30 ENCOUNTER — Ambulatory Visit (HOSPITAL_COMMUNITY)
Admission: RE | Admit: 2014-08-30 | Discharge: 2014-08-30 | Disposition: A | Discharge status: Home / Self Care | Payer: Commercial Managed Care - HMO | Source: Ambulatory Visit | Attending: Internal Medicine | Admitting: Internal Medicine

## 2014-08-30 DIAGNOSIS — Z1231 Encounter for screening mammogram for malignant neoplasm of breast: Secondary | ICD-10-CM | POA: Diagnosis not present

## 2014-09-07 ENCOUNTER — Telehealth: Payer: Self-pay | Admitting: Hematology

## 2014-09-07 ENCOUNTER — Other Ambulatory Visit (HOSPITAL_BASED_OUTPATIENT_CLINIC_OR_DEPARTMENT_OTHER): Payer: Commercial Managed Care - HMO

## 2014-09-07 ENCOUNTER — Ambulatory Visit (HOSPITAL_BASED_OUTPATIENT_CLINIC_OR_DEPARTMENT_OTHER): Payer: Commercial Managed Care - HMO | Admitting: Hematology

## 2014-09-07 ENCOUNTER — Ambulatory Visit (HOSPITAL_BASED_OUTPATIENT_CLINIC_OR_DEPARTMENT_OTHER): Payer: Commercial Managed Care - HMO

## 2014-09-07 ENCOUNTER — Encounter: Payer: Self-pay | Admitting: Hematology

## 2014-09-07 VITALS — BP 146/65 | HR 59 | Temp 97.7°F | Resp 16 | Ht 64.5 in | Wt 145.9 lb

## 2014-09-07 DIAGNOSIS — C187 Malignant neoplasm of sigmoid colon: Secondary | ICD-10-CM | POA: Diagnosis not present

## 2014-09-07 DIAGNOSIS — D649 Anemia, unspecified: Secondary | ICD-10-CM | POA: Diagnosis not present

## 2014-09-07 DIAGNOSIS — C189 Malignant neoplasm of colon, unspecified: Secondary | ICD-10-CM

## 2014-09-07 LAB — COMPREHENSIVE METABOLIC PANEL (CC13)
ALT: 16 U/L (ref 0–55)
AST: 22 U/L (ref 5–34)
Albumin: 3.6 g/dL (ref 3.5–5.0)
Alkaline Phosphatase: 98 U/L (ref 40–150)
Anion Gap: 7 mEq/L (ref 3–11)
BUN: 25.7 mg/dL (ref 7.0–26.0)
CHLORIDE: 109 meq/L (ref 98–109)
CO2: 29 mEq/L (ref 22–29)
Calcium: 9.2 mg/dL (ref 8.4–10.4)
Creatinine: 0.9 mg/dL (ref 0.6–1.1)
EGFR: 58 mL/min/{1.73_m2} — ABNORMAL LOW (ref >90)
Glucose: 86 mg/dl (ref 70–140)
POTASSIUM: 4.2 meq/L (ref 3.5–5.1)
SODIUM: 144 meq/L (ref 136–145)
Total Bilirubin: 0.59 mg/dL (ref 0.20–1.20)
Total Protein: 6.3 g/dL — ABNORMAL LOW (ref 6.4–8.3)

## 2014-09-07 LAB — IRON AND TIBC CHCC
%SAT: 66 % — ABNORMAL HIGH (ref 21–57)
Iron: 237 ug/dL — ABNORMAL HIGH (ref 41–142)
TIBC: 359 ug/dL (ref 236–444)
UIBC: 122 ug/dL (ref 120–384)

## 2014-09-07 LAB — CBC & DIFF AND RETIC
BASO%: 0.6 % (ref 0.0–2.0)
Basophils Absolute: 0 10*3/uL (ref 0.0–0.1)
EOS ABS: 0.2 10*3/uL (ref 0.0–0.5)
EOS%: 3.2 % (ref 0.0–7.0)
HEMATOCRIT: 29.8 % — AB (ref 34.8–46.6)
HGB: 9.4 g/dL — ABNORMAL LOW (ref 11.6–15.9)
Immature Retic Fract: 7.2 % (ref 1.60–10.00)
LYMPH%: 16.2 % (ref 14.0–49.7)
MCH: 27.2 pg (ref 25.1–34.0)
MCHC: 31.5 g/dL (ref 31.5–36.0)
MCV: 86.1 fL (ref 79.5–101.0)
MONO#: 0.4 10*3/uL (ref 0.1–0.9)
MONO%: 8.8 % (ref 0.0–14.0)
NEUT#: 3.3 10*3/uL (ref 1.5–6.5)
NEUT%: 71.2 % (ref 38.4–76.8)
Platelets: 156 10*3/uL (ref 145–400)
RBC: 3.46 10*6/uL — ABNORMAL LOW (ref 3.70–5.45)
RDW: 15.2 % — ABNORMAL HIGH (ref 11.2–14.5)
Retic %: 1.21 % (ref 0.70–2.10)
Retic Ct Abs: 41.87 10*3/uL (ref 33.70–90.70)
WBC: 4.7 10*3/uL (ref 3.9–10.3)
lymph#: 0.8 10*3/uL — ABNORMAL LOW (ref 0.9–3.3)

## 2014-09-07 LAB — FERRITIN CHCC: Ferritin: 11 ng/ml (ref 9–269)

## 2014-09-07 NOTE — Progress Notes (Addendum)
- [place]  [phone] [phone]  Clinic New Consult Note   Patient Care Team: Prince Solian, MD as PCP - General (Internal Medicine) Gaye Pollack, MD as Consulting Physician (Cardiothoracic Surgery) 09/07/2014  CHIEF COMPLAINTS/PURPOSE OF CONSULTATION:  Stage II colon cancer    Colon cancer   04/09/2014 Procedure Colonoscopy per Dr. Silvano Rusk: Large fleshy mass sigmoid colon 25 cm from anus. Could not pass through.   04/09/2014 Tumor Marker CEA =11.7   04/12/2014 Imaging CT C/A/P:sigmoid colon mass;several small areas of soft tissue nodularity in peritoneal cavity; small attenuation structure posterior right hepatic lobe; 2 stable nodules in lungs dating back to 2014. No acute findings.   05/17/2014 Initial Diagnosis Colon cancer-presented with heme + stool and more frequent BMs   05/17/2014 Surgery Robot assisted sigmoidectomy   05/17/2014 Pathologic Stage pT3,pN0,pMX--Grade 1--0/13 nodes +--MMR normal   05/17/2014 Clinical Stage Stage II    HISTORY OF PRESENTING ILLNESS:  Heidi Marquez 81 y.o. female is here because of recently diagnosed a stage II sigmoid colon cancer.  She had annual physical with her PCP in early Feb this year, and stool OB (+), no overt GI bleeding, CBC was normal. She had noticed some loose BM before that, but no nausea, loss of appetite, weight loss or other symoptoms. Colonoscopy showed a large mass in the sigmoid colon 25 cm from anus. Biopsy was taken which showed tubulovillous adenoma, no malignancy. She was referred to surgeon Dr. Marcello Moores and underwent robotic-assisted sigmoidectomy on 4/4. She tolerated the procedure well, and was discharged home afterwards.  She has recovered well from the surgery 3 weeks ago, she has mild fatigue, but otherwise doing very well without other complains. She had 2-3 episodes of bleeding after the surgery, which was related to her herromods. She is eating well. 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   She has chronic knee pain she takes ibuprofen once daily.    INTERIM HISTORY: Heidi Marquez returns for follow-up. She is doing well overall, she does notice slightly more fatigued lately, and shortness breast and palpitation when she climbs stairs, no chest pain or any other pain, no nausea or other GI symptoms. She has good appetite and eats well. Her bowel movements normal. She did try oral iron pill once a day after surgery, but could not tolerate due to the constipation and the GI upset, and stopped 1 week later.   MEDICAL HISTORY:  Past Medical History  Diagnosis Date  . Diverticulosis of colon     severe in sigmoid but diffusley throughout colon  on 2009 colonoscopy  . Internal hemorrhoids   . Hematochezia 2009  . Benign esophageal stricture 04/2007    EGD with dilatation.  Marland Kitchen GERD (gastroesophageal reflux disease)   . HH (hiatus hernia)   . Macular degeneration   . Arthritis   . IBS (irritable bowel syndrome)   . Epistaxis ~ 2005    required cauterization twice.   . Reflux esophagitis   . Allergy   . Cataract     ight eye starting  . Dysrhythmia     patient states for last year, when coming up steps , has palpitations-heart beats fast    SURGICAL HISTORY: Past Surgical History  Procedure Laterality Date  . Abdominal hysterectomy      partial with the bladder tack  . Bladder tack    . Retinal laser surgery    . Inguinal  hernia repair  early 1990s    right  . Basal cell resection  08/2011, 11/2011    initially biopsy 08/3011.  repeat excision right nose with grafting from the skin behind the right ear  . Colonoscopy    . Esophagogastroduodenoscopy    . Upper gastrointestinal endoscopy    . Appendectomy      patient states does not know about that    SOCIAL HISTORY: History   Social History  . Marital Status: Married    Spouse Name: N/A  . Number of Children: N/A  . Years of Education: N/A    Occupational History  . Not on file.   Social History Main Topics  . Smoking status: Passive Smoke Exposure - Never Smoker  . Smokeless tobacco: Never Used  . Alcohol Use: No  . Drug Use: No  . Sexual Activity: Not on file   Other Topics Concern  . Not on file   Social History Narrative   Married, husband is disabled and she is caregiver   Has #1 child (daughter) living. Son died of MI at age 30   Worked at [employer] before she retired   Has #3 cats    FAMILY HISTORY: Family History  Problem Relation Age of Onset  . Colon cancer Paternal Grandfather 105    colon cancer   . Esophageal cancer Neg Hx   . Rectal cancer Neg Hx   . Stomach cancer Neg Hx   . Stroke Father   . Heart attack Son 61    died of heart attack     ALLERGIES:  is allergic to penicillins; sulfonamide derivatives; and amoxicillin-pot clavulanate.  MEDICATIONS:  Current Outpatient Prescriptions  Medication Sig Dispense Refill  . alendronate (FOSAMAX) 70 MG tablet Take 70 mg by mouth every 7 (seven) days. Tuesdays. Take with a full glass of water on an empty stomach.    . Ascorbic Acid (VITAMIN C) 1000 MG tablet Take 1,000 mg by mouth daily.    . Biotin 1000 MCG tablet Take 1,000 mcg by mouth daily.     Marland Kitchen CALCIUM & MAGNESIUM CARBONATES PO Take 1 tablet by mouth daily.    . cholecalciferol (VITAMIN D) 1000 UNITS tablet Take 1,000 Units by mouth daily.    Marland Kitchen glucosamine-chondroitin 500-400 MG tablet Take 1 tablet by mouth daily.     . Homeopathic Products ([employer] LEG CRAMPS PAIN RELIEF PO) Take 1 tablet by mouth daily as needed (for leg cramps).    Marland Kitchen ibuprofen (ADVIL,MOTRIN) 200 MG tablet Take 200 mg by mouth every morning.    . loratadine (CLARITIN) 10 MG tablet Take 10 mg by mouth daily.    . Multiple Vitamin (MULTIVITAMIN) tablet Take 1 tablet by mouth daily.    . vitamin B-12 (CYANOCOBALAMIN) 1000 MCG tablet Take 1,000 mcg by mouth daily.     Marland Kitchen HYDROcodone-acetaminophen (NORCO/VICODIN) 5-325 MG  per tablet Take 1-2 tablets by mouth every 4 (four) hours as needed for moderate pain. (Patient not taking: Reported on 09/07/2014) 30 tablet 0  . naproxen sodium (ANAPROX) 220 MG tablet Take 220 mg by mouth daily.      No current facility-administered medications for this visit.    REVIEW OF SYSTEMS:   Constitutional: Denies fevers, chills or abnormal night sweats Eyes: Denies blurriness of vision, double vision or watery eyes Ears, nose, mouth, throat, and face: Denies mucositis or sore throat Respiratory: Denies cough, dyspnea or wheezes Cardiovascular: Denies palpitation, chest discomfort or lower extremity swelling Gastrointestinal:  Denies nausea, heartburn or change in bowel habits Skin: Denies abnormal skin rashes Lymphatics: Denies new lymphadenopathy or easy bruising Neurological:Denies numbness, tingling or new weaknesses Behavioral/Psych: Mood is stable, no new changes  All other systems were reviewed with the patient and are negative.  PHYSICAL EXAMINATION: ECOG PERFORMANCE STATUS: 0 - Asymptomatic  Filed Vitals:   09/07/14 1025  BP: 146/65  Pulse: 59  Temp: 97.7 F (36.5 C)  Resp: 16   Filed Weights   09/07/14 1025  Weight: 145 lb 14.4 oz (66.18 kg)    GENERAL:alert, no distress and comfortable SKIN: skin color, texture, turgor are normal, no rashes or significant lesions EYES: normal, conjunctiva are pink and non-injected, sclera clear OROPHARYNX:no exudate, no erythema and lips, buccal mucosa, and tongue normal  NECK: supple, thyroid normal size, non-tender, without nodularity LYMPH:  no palpable lymphadenopathy in the cervical, axillary or inguinal LUNGS: clear to auscultation and percussion with normal breathing effort HEART: regular rate & rhythm and no murmurs and no lower extremity edema ABDOMEN:abdomen soft, non-tender and normal bowel sounds Musculoskeletal:no cyanosis of digits and no clubbing  PSYCH: alert & oriented x 3 with fluent speech NEURO:  no focal motor/sensory deficits  LABORATORY DATA:  I have reviewed the data as listed CBC Latest Ref Rng 09/07/2014 06/08/2014 05/20/2014  WBC 3.9 - 10.3 10e3/uL 4.7 5.9 10.8(H)  Hemoglobin 11.6 - 15.9 g/dL 9.4(L) 10.1(L) 11.2(L)  Hematocrit 34.8 - 46.6 % 29.8(L) 30.9(L) 34.8(L)  Platelets 145 - 400 10e3/uL 156 196 168     Recent Labs  04/09/14 1507  05/18/14 0525 05/19/14 0450 05/20/14 0505 06/08/14 1535 09/07/14 0946  NA 140  < > 139 139 137 145 144  K 4.1  < > 3.2* 3.7 3.5 4.0 4.2  CL 105  < > 103 106 104  --   --   CO2 32  < > '29 27 24 25 29  ' GLUCOSE 93  < > 108* 104* 105* 112 86  BUN 15  < > '10 9 11 ' 19.3 25.7  CREATININE 0.92  < > 0.84 0.70 0.67 1.1 0.9  CALCIUM 9.0  < > 7.8* 8.1* 8.2* 9.3 9.2  GFRNONAA  --   < > 65* 81* 82*  --   --   GFRAA  --   < > 75* >90 >90  --   --   PROT 6.7  --   --   --   --  6.3* 6.3*  ALBUMIN 4.1  --   --   --   --  3.5 3.6  AST 22  --   --   --   --  22 22  ALT 14  --   --   --   --  12 16  ALKPHOS 98  --   --   --   --  81 98  BILITOT 0.7  --   --   --   --  0.41 0.59  < > = values in this interval not displayed.  Pathology report: Diagnosis 1. Colon, segmental resection for tumor, sigmoid, open end proximal - INVASIVE ADENOCARCINOMA WITH EXTRACELLULAR MUCIN, INVADING THROUGH THE MUSCULARIS PROPRIA INTO PERICOLONIC FATTY TISSUE. - THIRTEEN LYMPH NODES, NEGATIVE FOR METASTATIC CARCINOMA (0/13). - MULTIPLE DIVERTICULA. - RESECTION MARGINS, NEGATIVE FOR ATYPIA OR MALIGNANCY. - PLEASE SEE ONCOLOGY TEMPLATE FOR DETAILS. 2. Colon, resection margin (donut), sigmoid - BENIGN COLONIC MUCOSA, NO EVIDENCE OF DYSPLASIA OR MALIGNANCY. Specimen: Left colon. Procedure: Segmental resection. Tumor site: Sigmoid colon. Specimen  integrity: Intact. Macroscopic intactness of mesorectum: N/A Macroscopic tumor perforation: No Invasive tumor: Invasive adenocarcinoma with extracellular mucin. Maximum size: 8.0 cm, gross measurement. Histologic type(s):  Invasive adenocarcinoma with extracellular mucin. Histologic grade and differentiation: G1: well differentiated/low grade Type of polyp in which invasive carcinoma arose: Villous adenoma. Microscopic extension of invasive tumor: Invading through the muscularis propria into pericolonic fatty tissue. Lymph-Vascular invasion: Not identified. Peri-neural invasion: Not identified. Tumor deposit(s) (discontinuous extramural extension): None. Resection margins: Negative. Proximal margin: 9.6 cm. Distal margin: 9.6 cm. Mesenteric margin (sigmoid and transverse): 6 cm. Treatment effect (neo-adjuvant therapy): No Additional polyp(s): N/A Non-neoplastic findings: Prominent Crohn-like reaction around the tumor. Lymph nodes: number examined 13; number positive: 0 Pathologic Staging: pT3, pN0, pMX  MMR: NORMAL MSI: STABLE  RADIOGRAPHIC STUDIES: I have personally reviewed the radiological images as listed and agreed with the findings in the report.  CT chest, abdomen and pelvis with contrast 04/12/2014 IMPRESSION: 1. No acute findings within the chest abdomen or pelvis. 2. There are 2 pulmonary nodules within the right lung which are stable dating back to 12/31/2012. 3. Sigmoid colon mass. 4. There are several very subtle areas of soft tissue nodularity within the peritoneal cavity. Peritoneal metastasis cannot be excluded and careful interval followup of these lesions is recommended. 5. Single small low attenuation structure within the posterior right hepatic lobe is identified. This is too small to characterize and is indeterminate. 6. Persistent nodule in left lobe of thyroid gland. Consider further evaluation with thyroid ultrasound. If patient is clinically hyperthyroid, consider nuclear medicine thyroid uptake and scan.  ASSESSMENT & PLAN:  81 year old Caucasian female, with past medical history of colon diverticulosis and arthritis, but otherwise healthy and active, who was found to  have a large sigmoid tumor, status post sigmoidectomy.  1. Sigmoid colon adenocarcinoma, pT3N0M0, stage II, G1, MSI stable  -I reviewed her surgical pathology findings, and her pre-op CT scan findings with her in great details. -we discussed that this is likely cured by the second complete surgical resection, however she still has a risk of cancer recurrence, which is every 20% for stage II colon cancer -her cancer does not have high risks features, such as T4 lesion, perforation, poor differentiation, lymphovascular invasion or perineural  Invasion, so I do not recommend adjuvant chemotherapy. The benefit of adjuvant chemotherapy in stage II colon cancer without high-risk features are uncertain. -We further discussed the surveillance plan, which includes physical exam and lab ( CBC, CMP and CEA) every 3 months for 2 years then every 6 months for additional 3 years. I'll obtain a CT chest abdomen and pelvis in 6 months, then once every year. She would need a repeated a colonoscopy in one year -She voiced good understanding about the above, and agreed to proceed -We also discussed the genetic syndrome of colon cancer. Her tumor has MSI stable unlikely she has Lynch syndrome. However I strongly recommend her daughter and brother to be screened for colon cancer by colonoscopy -Lab reviewed, she has slightly worse anemia, otherwise unremarkable. Her CEA level is still pending from today.  2. Worsening anemia -Her hemoglobin was normal before surgery in April, anemia slightly getting worse in the past 3 months. Hemoglobin 9.4 today, MCV normal. -I'll check her ferritin and iron studies today. If it's low, I'll consider IV Feraheme. She did not tolerate oral iron. Potential side effects, including allergic reactions, we'll discussed with her and she agrees.  3. Lung nodules -She has two small right lung nodules which were  discovered on the CT scan, stable since 01/2013  -she never smoked, low risk for  lung cancer -We will follow up on subsequent CT scan in 2017   Plan -lab today -Return to clinic in 3 months with CT abdomen and pelvis with IV contrast and the lab 1 week before   All questions were answered. The patient knows to call the clinic with any problems, questions or concerns. I spent 20 minutes counseling the patient face to face. The total time spent in the appointment was 25 minutes and more than 50% was on counseling.     Truitt Merle, MD 09/07/2014 10:53 AM    Addendum, Her CEA came back as 29, I will obtain restaging CT CAP in the next few weeks, and I will see her after the scan. I spoke with her today.  Truitt Merle  09/09/2014   CEA (Order [id number])      CEA  Status: Finalresult Visible to patient:  Not Released Nextappt: 12/15/2014 at 09:30 AM in Oncology ([facility]) Dx:  Colon cancer              Ref Range 2d ago  63moago  552mogo     CEA 0.0 - 5.0 ng/mL 29.3 (H) 16.0 (H) 11.7 (H)

## 2014-09-07 NOTE — Telephone Encounter (Signed)
Gave patient avs report and appointments for November. [facility] will call patient with ct - patient aware.

## 2014-09-08 ENCOUNTER — Other Ambulatory Visit: Payer: Self-pay | Admitting: *Deleted

## 2014-09-08 ENCOUNTER — Telehealth: Payer: Self-pay | Admitting: *Deleted

## 2014-09-08 LAB — CEA: CEA: 29.3 ng/mL — ABNORMAL HIGH (ref 0.0–5.0)

## 2014-09-08 NOTE — Telephone Encounter (Signed)
Pt called early this am wanting to know if Dr. Burr Medico would order CT Chest scan to be added with CT A/P to follow up on pulmonary nodules.  Dr. Burr Medico notified.  Spoke with pt and informed pt re: Per Dr. Burr Medico,  CT Chest done in Feb 2016 showed pulmonary nodules unchanged - stable since 2014.  Dr. Burr Medico just wanted CT A/P to be done in Oct. 2016.    A CT Chest will be ordered with the following scans as per md's decisions.   Pt voiced understanding.

## 2014-09-09 ENCOUNTER — Other Ambulatory Visit: Payer: Commercial Managed Care - HMO

## 2014-09-09 ENCOUNTER — Ambulatory Visit: Payer: Commercial Managed Care - HMO | Admitting: Hematology

## 2014-09-09 ENCOUNTER — Telehealth: Payer: Self-pay | Admitting: Hematology

## 2014-09-09 ENCOUNTER — Other Ambulatory Visit: Payer: Self-pay | Admitting: Hematology

## 2014-09-09 DIAGNOSIS — C189 Malignant neoplasm of colon, unspecified: Secondary | ICD-10-CM

## 2014-09-09 NOTE — Telephone Encounter (Signed)
perpof to sch pt 2 weeks after CT-cld & spoke to pt to adv of appt time & date on 8/14-adv that [facility] will be calling to sch scan-pt stated will come to pick up contrast on Monday 8/1.

## 2014-09-16 ENCOUNTER — Ambulatory Visit (HOSPITAL_COMMUNITY)
Admission: RE | Admit: 2014-09-16 | Discharge: 2014-09-16 | Disposition: A | Discharge status: Home / Self Care | Payer: Commercial Managed Care - HMO | Source: Ambulatory Visit | Attending: Hematology | Admitting: Hematology

## 2014-09-16 ENCOUNTER — Encounter (HOSPITAL_COMMUNITY): Payer: Self-pay

## 2014-09-16 DIAGNOSIS — R918 Other nonspecific abnormal finding of lung field: Secondary | ICD-10-CM | POA: Diagnosis not present

## 2014-09-16 DIAGNOSIS — E041 Nontoxic single thyroid nodule: Secondary | ICD-10-CM | POA: Insufficient documentation

## 2014-09-16 DIAGNOSIS — I517 Cardiomegaly: Secondary | ICD-10-CM | POA: Diagnosis not present

## 2014-09-16 DIAGNOSIS — I712 Thoracic aortic aneurysm, without rupture: Secondary | ICD-10-CM | POA: Insufficient documentation

## 2014-09-16 DIAGNOSIS — C189 Malignant neoplasm of colon, unspecified: Secondary | ICD-10-CM | POA: Insufficient documentation

## 2014-09-16 DIAGNOSIS — K5730 Diverticulosis of large intestine without perforation or abscess without bleeding: Secondary | ICD-10-CM | POA: Insufficient documentation

## 2014-09-16 DIAGNOSIS — K8020 Calculus of gallbladder without cholecystitis without obstruction: Secondary | ICD-10-CM | POA: Insufficient documentation

## 2014-09-16 MED ORDER — IOHEXOL 300 MG/ML  SOLN
100.0000 mL | Freq: Once | INTRAMUSCULAR | Status: AC | PRN
  Administered 2014-09-16: 100 mL via INTRAVENOUS

## 2014-09-17 ENCOUNTER — Other Ambulatory Visit: Payer: Self-pay | Admitting: Hematology

## 2014-09-20 ENCOUNTER — Telehealth: Payer: Self-pay | Admitting: Hematology

## 2014-09-20 NOTE — Telephone Encounter (Signed)
Called and left a message with an earlier appointment per pof  anne

## 2014-09-23 ENCOUNTER — Telehealth: Payer: Self-pay | Admitting: Hematology

## 2014-09-23 NOTE — Telephone Encounter (Signed)
pt cld to get appt time-cld & spoke to pt and gave tp tme & date-pt understood

## 2014-09-27 ENCOUNTER — Encounter: Payer: Self-pay | Admitting: Hematology

## 2014-09-27 ENCOUNTER — Ambulatory Visit (HOSPITAL_BASED_OUTPATIENT_CLINIC_OR_DEPARTMENT_OTHER): Payer: Commercial Managed Care - HMO | Admitting: Hematology

## 2014-09-27 VITALS — BP 141/77 | HR 89 | Temp 98.2°F | Resp 18 | Ht 64.5 in | Wt 146.5 lb

## 2014-09-27 DIAGNOSIS — C189 Malignant neoplasm of colon, unspecified: Secondary | ICD-10-CM

## 2014-09-27 DIAGNOSIS — D649 Anemia, unspecified: Secondary | ICD-10-CM | POA: Diagnosis not present

## 2014-09-27 DIAGNOSIS — R918 Other nonspecific abnormal finding of lung field: Secondary | ICD-10-CM | POA: Diagnosis not present

## 2014-09-27 DIAGNOSIS — C187 Malignant neoplasm of sigmoid colon: Secondary | ICD-10-CM

## 2014-09-27 NOTE — Progress Notes (Signed)
- [facility]  [phone] [phone]  Clinic New Consult Note   Patient Care Team: Heidi Avva, MD as PCP - General (Internal Medicine) Heidi K Bartle, MD as Consulting Physician (Cardiothoracic Surgery) 09/27/2014  CHIEF COMPLAINTS/PURPOSE OF CONSULTATION:  Stage II colon cancer    Colon cancer   04/09/2014 Procedure Colonoscopy per Dr. Carl Marquez: Large fleshy mass sigmoid colon 25 cm from anus. Could not pass through.   04/09/2014 Tumor Marker CEA =11.7   04/12/2014 Imaging CT C/A/P:sigmoid colon mass;several small areas of soft tissue nodularity in peritoneal cavity; small attenuation structure posterior right hepatic lobe; 2 stable nodules in lungs dating back to 2014. No acute findings.   05/17/2014 Initial Diagnosis Colon cancer-presented with heme + stool and more frequent BMs   05/17/2014 Surgery Robot assisted sigmoidectomy   05/17/2014 Pathologic Stage pT3,pN0,pMX--Grade 1--0/13 nodes +--MMR normal   05/17/2014 Clinical Stage Stage II    HISTORY OF PRESENTING ILLNESS:  Heidi Marquez 81 y.o. female is here because of recently diagnosed a stage II sigmoid colon cancer.  She had annual physical with her PCP in early Feb this year, and stool OB (+), no overt GI bleeding, CBC was normal. She had noticed some loose BM before that, but no nausea, loss of appetite, weight loss or other symoptoms. Colonoscopy showed a large mass in the sigmoid colon 25 cm from anus. Biopsy was taken which showed tubulovillous adenoma, no malignancy. She was referred to surgeon Dr. Thomas and underwent robotic-assisted sigmoidectomy on 4/4. She tolerated the procedure well, and was discharged home afterwards.  She has recovered well from the surgery 3 weeks ago, she has mild fatigue, but otherwise doing very well without other complains. She had 2-3 episodes of bleeding after the surgery, which was related to her herromods. She is eating well. 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   She has chronic knee pain she takes ibuprofen once daily.    INTERIM HISTORY: Ms Heidi Marquez returns for follow-up and he discussed her restaging CT scan. She is clinically stable, no new complaints since I saw her 2 weeks ago. She does report slightly more fatigued lately, also able to function very well at home. She is a caregiver for her husband, who has moderate dementia. She has a daughter lives in the town.   MEDICAL HISTORY:  Past Medical History  Diagnosis Date  . Diverticulosis of colon     severe in sigmoid but diffusley throughout colon  on 2009 colonoscopy  . Internal hemorrhoids   . Hematochezia 2009  . Benign esophageal stricture 04/2007    EGD with dilatation.  . GERD (gastroesophageal reflux disease)   . HH (hiatus hernia)   . Macular degeneration   . Arthritis   . IBS (irritable bowel syndrome)   . Epistaxis ~ 2005    required cauterization twice.   . Reflux esophagitis   . Allergy   . Cataract     ight eye starting  . Dysrhythmia     patient states for last year, when coming up steps , has palpitations-heart beats fast    SURGICAL HISTORY: Past Surgical History  Procedure Laterality Date  . Abdominal hysterectomy      partial with the bladder tack  . Bladder tack    . Retinal laser surgery    . Inguinal hernia repair  early 1990s    right  . Basal cell resection  08/2011, 11/2011      initially biopsy 08/3011.  repeat excision right nose with grafting from the skin behind the right ear  . Colonoscopy    . Esophagogastroduodenoscopy    . Upper gastrointestinal endoscopy    . Appendectomy      patient states does not know about that    SOCIAL HISTORY: Social History   Social History  . Marital Status: Married    Spouse Name: N/A  . Number of Children: N/A  . Years of Education: N/A   Occupational History  . Not on file.   Social History Main Topics  . Smoking status: Passive Smoke  Exposure - Never Smoker  . Smokeless tobacco: Never Used  . Alcohol Use: No  . Drug Use: No  . Sexual Activity: Not on file   Other Topics Concern  . Not on file   Social History Narrative   Married, husband is disabled and she is caregiver   Has #1 child (daughter) living. 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 Colon cancer Paternal Grandfather 60    colon cancer   . Esophageal cancer Neg Hx   . Rectal cancer Neg Hx   . Stomach cancer Neg Hx   . Stroke Father   . Heart attack Son 50    died of heart attack     ALLERGIES:  is allergic to penicillins; sulfonamide derivatives; and amoxicillin-pot clavulanate.  MEDICATIONS:  Current Outpatient Prescriptions  Medication Sig Dispense Refill  . alendronate (FOSAMAX) 70 MG tablet Take 70 mg by mouth every 7 (seven) days. Tuesdays. Take with a full glass of water on an empty stomach.    . Ascorbic Acid (VITAMIN C) 1000 MG tablet Take 1,000 mg by mouth daily.    . Biotin 1000 MCG tablet Take 1,000 mcg by mouth daily.     . CALCIUM & MAGNESIUM CARBONATES PO Take 1 tablet by mouth daily.    . cholecalciferol (VITAMIN D) 1000 UNITS tablet Take 1,000 Units by mouth daily.    . glucosamine-chondroitin 500-400 MG tablet Take 1 tablet by mouth daily.     . Homeopathic Products ([employer] LEG CRAMPS PAIN RELIEF PO) Take 1 tablet by mouth daily as needed (for leg cramps).    . HYDROcodone-acetaminophen (NORCO/VICODIN) 5-325 MG per tablet Take 1-2 tablets by mouth every 4 (four) hours as needed for moderate pain. (Patient not taking: Reported on 09/07/2014) 30 tablet 0  . ibuprofen (ADVIL,MOTRIN) 200 MG tablet Take 200 mg by mouth every morning.    . loratadine (CLARITIN) 10 MG tablet Take 10 mg by mouth daily.    . Multiple Vitamin (MULTIVITAMIN) tablet Take 1 tablet by mouth daily.    . naproxen sodium (ANAPROX) 220 MG tablet Take  220 mg by mouth daily.     . vitamin B-12 (CYANOCOBALAMIN) 1000 MCG tablet Take 1,000 mcg by mouth daily.      No current facility-administered medications for this visit.    REVIEW OF SYSTEMS:   Constitutional: Denies fevers, chills or abnormal night sweats Eyes: Denies blurriness of vision, double vision or watery eyes Ears, nose, mouth, throat, and face: Denies mucositis or sore throat Respiratory: Denies cough, dyspnea or wheezes Cardiovascular: Denies palpitation, chest discomfort or lower extremity swelling Gastrointestinal:  Denies nausea, heartburn or change in bowel habits Skin: Denies abnormal skin rashes Lymphatics: Denies new lymphadenopathy or easy   bruising Neurological:Denies numbness, tingling or new weaknesses Behavioral/Psych: Mood is stable, no new changes  All other systems were reviewed with the patient and are negative.  PHYSICAL EXAMINATION: ECOG PERFORMANCE STATUS: 0 - Asymptomatic  Filed Vitals:   09/27/14 1344  BP: 141/77  Pulse: 89  Temp: 98.2 F (36.8 C)  Resp: 18   Filed Weights   09/27/14 1344  Weight: 146 lb 8 oz (66.452 kg)    GENERAL:alert, no distress and comfortable SKIN: skin color, texture, turgor are normal, no rashes or significant lesions EYES: normal, conjunctiva are pink and non-injected, sclera clear OROPHARYNX:no exudate, no erythema and lips, buccal mucosa, and tongue normal  NECK: supple, thyroid normal size, non-tender, without nodularity LYMPH:  no palpable lymphadenopathy in the cervical, axillary or inguinal LUNGS: clear to auscultation and percussion with normal breathing effort HEART: regular rate & rhythm and no murmurs and no lower extremity edema ABDOMEN:abdomen soft, non-tender and normal bowel sounds Musculoskeletal:no cyanosis of digits and no clubbing  PSYCH: alert & oriented x 3 with fluent speech NEURO: no focal motor/sensory deficits  LABORATORY DATA:  I have reviewed the data as listed CBC Latest Ref Rng  09/07/2014 06/08/2014 05/20/2014  WBC 3.9 - 10.3 10e3/uL 4.7 5.9 10.8(H)  Hemoglobin 11.6 - 15.9 g/dL 9.4(L) 10.1(L) 11.2(L)  Hematocrit 34.8 - 46.6 % 29.8(L) 30.9(L) 34.8(L)  Platelets 145 - 400 10e3/uL 156 196 168     Recent Labs  04/09/14 1507  05/18/14 0525 05/19/14 0450 05/20/14 0505 06/08/14 1535 09/07/14 0946  NA 140  < > 139 139 137 145 144  K 4.1  < > 3.2* 3.7 3.5 4.0 4.2  CL 105  < > 103 106 104  --   --   CO2 32  < > 29 27 24 25 29  GLUCOSE 93  < > 108* 104* 105* 112 86  BUN 15  < > 10 9 11 19.3 25.7  CREATININE 0.92  < > 0.84 0.70 0.67 1.1 0.9  CALCIUM 9.0  < > 7.8* 8.1* 8.2* 9.3 9.2  GFRNONAA  --   < > 65* 81* 82*  --   --   GFRAA  --   < > 75* >90 >90  --   --   PROT 6.7  --   --   --   --  6.3* 6.3*  ALBUMIN 4.1  --   --   --   --  3.5 3.6  AST 22  --   --   --   --  22 22  ALT 14  --   --   --   --  12 16  ALKPHOS 98  --   --   --   --  81 98  BILITOT 0.7  --   --   --   --  0.41 0.59  < > = values in this interval not displayed.  CEA  Status: Finalresult Visible to patient:  Not Released Nextappt: 12/15/2014 at 09:30 AM in Oncology ([facility]) Dx:  Colon cancer              Ref Range 2wk ago  3mo ago  5mo ago     CEA 0.0 - 5.0 ng/mL 29.3 (H) 16.0 (H) 11.7 (H)         Pathology report: Diagnosis 1. Colon, segmental resection for tumor, sigmoid, open end proximal - INVASIVE ADENOCARCINOMA WITH EXTRACELLULAR MUCIN, INVADING THROUGH THE MUSCULARIS PROPRIA INTO PERICOLONIC FATTY TISSUE. - THIRTEEN LYMPH   NODES, NEGATIVE FOR METASTATIC CARCINOMA (0/13). - MULTIPLE DIVERTICULA. - RESECTION MARGINS, NEGATIVE FOR ATYPIA OR MALIGNANCY. - PLEASE SEE ONCOLOGY TEMPLATE FOR DETAILS. 2. Colon, resection margin (donut), sigmoid - BENIGN COLONIC MUCOSA, NO EVIDENCE OF DYSPLASIA OR MALIGNANCY. Specimen: Left colon. Procedure: Segmental resection. Tumor site: Sigmoid colon. Specimen integrity: Intact. Macroscopic intactness of  mesorectum: N/A Macroscopic tumor perforation: No Invasive tumor: Invasive adenocarcinoma with extracellular mucin. Maximum size: 8.0 cm, gross measurement. Histologic type(s): Invasive adenocarcinoma with extracellular mucin. Histologic grade and differentiation: G1: well differentiated/low grade Type of polyp in which invasive carcinoma arose: Villous adenoma. Microscopic extension of invasive tumor: Invading through the muscularis propria into pericolonic fatty tissue. Lymph-Vascular invasion: Not identified. Peri-neural invasion: Not identified. Tumor deposit(s) (discontinuous extramural extension): None. Resection margins: Negative. Proximal margin: 9.6 cm. Distal margin: 9.6 cm. Mesenteric margin (sigmoid and transverse): 6 cm. Treatment effect (neo-adjuvant therapy): No Additional polyp(s): N/A Non-neoplastic findings: Prominent Crohn-like reaction around the tumor. Lymph nodes: number examined 13; number positive: 0 Pathologic Staging: pT3, pN0, pMX  MMR: NORMAL MSI: STABLE  RADIOGRAPHIC STUDIES: I have personally reviewed the radiological images as listed and agreed with the findings in the report.  CT chest, abdomen and pelvis with contrast 09/16/2014 IMPRESSION: Annular constricting masslike soft tissue density involving the cecum measuring 2.3 x 3.3 cm, highly suspicious for metachronous primary colon carcinoma. Colonoscopy is recommended for further evaluation.  Increased size of 1.5 cm right pericolonic soft tissue nodule or lymph node, consistent with metastatic disease. Small less than 1 cm right lower quadrant mesenteric lymph nodes remain stable; local metastatic disease cannot be excluded.  No liver metastases identified.  Two new ill-defined pulmonary nodules in right upper lobe and left lower lobe, suspicious for pulmonary metastases.  Stable incidental findings including 4.0 cm ascending thoracic aortic aneurysm, left thyroid lobe nodule, and  cholelithiasis  ASSESSMENT & PLAN:  81 year old Caucasian female, with past medical history of colon diverticulosis and arthritis, but otherwise healthy and active, who was found to have a large sigmoid tumor, status post sigmoidectomy.  1. Sigmoid colon adenocarcinoma, pT3N0M0, stage II, G1, MSI stable  -I reviewed her surgical pathology findings, and her pre-op CT scan findings with her in great details. -we discussed that this is likely cured by the second complete surgical resection, however she still has a risk of cancer recurrence, which is every 20% for stage II colon cancer -her cancer does not have high risks features, such as T4 lesion, perforation, poor differentiation, lymphovascular invasion or perineural  Invasion, so I do not recommend adjuvant chemotherapy. The benefit of adjuvant chemotherapy in stage II colon cancer without high-risk features are uncertain. -Her CEA did not return to normal after surgery, and has been trending up on repeated testing, concerning for colon cancer recurrence.  -I reviewed her restaging CT chest abdomen and pelvis, which showed a probable mass in the cecum, possible second primary colon cancer. -I'll refer her back to Dr. Carlean Purl for repeat colonoscopy. Her prior colonoscopy in February could not pass the obstruction from the sigmoid colon mass, and did not examine her entire colon.    2. Worsening anemia -Her hemoglobin was normal before surgery in April, anemia slightly getting worse in the past 3 months. Hemoglobin 9.4 today, MCV normal. -Her arm level was elevated at 237, but her ferritin was 11, results are not consistent, I'll repeat on next visit. -Possible bleeding from her new primary colon cancer  3. Lung nodules -She has two small right lung nodules which were  discovered on the CT scan,  RUL nodule slightly bigger on recent CT -She has been follow-up with Dr. Bartle, I will discuss with him.  -she never smoked, low risk for lung  cancer -We will follow up on subsequent CT scan in 2017   Plan -refer to GI Dr. Gessner for colonoscopy -I will see her after her colonoscopy and possible biopsy    All questions were answered. The patient knows to call the clinic with any problems, questions or concerns. I spent 20 minutes counseling the patient face to face. The total time spent in the appointment was 25 minutes and more than 50% was on counseling.     Daxson Reffett, MD 09/27/2014 1:47 PM    

## 2014-09-28 ENCOUNTER — Telehealth: Payer: Self-pay

## 2014-09-28 ENCOUNTER — Other Ambulatory Visit: Payer: Self-pay

## 2014-09-28 DIAGNOSIS — C189 Malignant neoplasm of colon, unspecified: Secondary | ICD-10-CM

## 2014-09-28 NOTE — Telephone Encounter (Signed)
-----   Message from Milus Banister, MD sent at 09/28/2014  1:52 PM EDT ----- Glendell Docker told me about it.  Would like to do colonoscopy this Thursday (MAC if possible, moderate sedation if no MAC available)  Thanks   ----- Message -----    From: Barron Alvine, CMA    Sent: 09/28/2014   1:45 PM      To: Milus Banister, MD  Dr Ardis Hughs this was sent to [employer] student, have you seen it? ----- Message -----    From: Gatha Mayer, MD    Sent: 09/28/2014   1:32 PM      To: Collene Schlichter, Talitha Givens, #  Krista Blue,  I am unable to do this soon but Oretha Caprice has kindly agreed to scope her likely this Thursday if we can pull it off.  Glendell Docker  ----- Message -----    From: Truitt Merle, MD    Sent: 09/27/2014   2:15 PM      To: Leighton Ruff, MD, Gaye Pollack, MD, #  Glendell Docker,  this lady has stage II sigmoid colon cancer in February 2016, and was resected. Her CEA did not come down to normal after her surgery, and went up significantly a few weeks ago on follow-up, her CT scan now reviewed a mass in the cecum. Could you repeat colonoscopy as soon as possible? CT scans did not show definitive distant metastases except multiple lung nodules.  Dr. Cyndia Bent, I know you have been following her lung nodules for a few years, her previous PET scan was negative in 2014, her right upper lobe lung nodule has appeared slightly bigger on recent CT. Could you take a look and let me know what you think?  Thanks.  Krista Blue

## 2014-09-28 NOTE — Telephone Encounter (Signed)
Colon scheduled, pt instructed and medications reviewed.  Patient instructions will be left at the front desk for pick up on 09/29/14.  Patient to call with any questions or concerns.

## 2014-09-29 ENCOUNTER — Telehealth: Payer: Self-pay

## 2014-09-29 ENCOUNTER — Encounter (HOSPITAL_COMMUNITY): Payer: Self-pay | Admitting: *Deleted

## 2014-09-29 NOTE — Anesthesia Preprocedure Evaluation (Addendum)
Anesthesia Evaluation  Patient identified by MRN, date of birth, ID band Patient awake    Reviewed: Allergy & Precautions, H&P , NPO status , Patient's Chart, lab work & pertinent test results  Airway Mallampati: III  TM Distance: <3 FB Neck ROM: full    Dental no notable dental hx. (+) Teeth Intact, Dental Advisory Given   Pulmonary neg pulmonary ROS,  breath sounds clear to auscultation  Pulmonary exam normal       Cardiovascular Exercise Tolerance: Good Normal cardiovascular exam+ dysrhythmias Rhythm:regular Rate:Normal  Palpitations when going up steps   Neuro/Psych Macular degeneration negative neurological ROS  negative psych ROS   GI/Hepatic negative GI ROS, Neg liver ROS, hiatal hernia, GERD-  Medicated and Controlled,Esophageal stricture LGIB   Endo/Other  negative endocrine ROS  Renal/GU negative Renal ROS  negative genitourinary   Musculoskeletal  (+) Arthritis -, Osteoarthritis,    Abdominal   Peds  Hematology negative hematology ROS (+)   Anesthesia Other Findings epistaxis  Reproductive/Obstetrics negative OB ROS                            Anesthesia Physical  Anesthesia Plan  ASA: II  Anesthesia Plan: MAC   Post-op Pain Management:    Induction: Intravenous  Airway Management Planned: Nasal Cannula  Additional Equipment:   Intra-op Plan:   Post-operative Plan: Extubation in OR  Informed Consent: I have reviewed the patients History and Physical, chart, labs and discussed the procedure including the risks, benefits and alternatives for the proposed anesthesia with the patient or authorized representative who has indicated his/her understanding and acceptance.   Dental Advisory Given  Plan Discussed with: CRNA and Anesthesiologist  Anesthesia Plan Comments: (Discussed risks/benefits/alternatives to MAC sedation including need for ventilatory support,  hypotension, need for conversion to general anesthesia.  All patient questions answered.  Patient wished to proceed.)        Anesthesia Quick Evaluation

## 2014-09-29 NOTE — Telephone Encounter (Signed)
Patient called and asked that hospital colon prep be faxed to her because she lives too far away. Patient gave me the fax number of [phone] and I faxed over for her. She is supposed to call me back once she receives them so we can go over the prep together.

## 2014-09-30 ENCOUNTER — Encounter (HOSPITAL_COMMUNITY): Payer: Self-pay | Admitting: *Deleted

## 2014-09-30 ENCOUNTER — Encounter (HOSPITAL_COMMUNITY)
Admission: RE | Disposition: A | Discharge status: Home / Self Care | Payer: Self-pay | Source: Ambulatory Visit | Attending: Gastroenterology

## 2014-09-30 ENCOUNTER — Ambulatory Visit (HOSPITAL_COMMUNITY): Payer: Commercial Managed Care - HMO | Admitting: Anesthesiology

## 2014-09-30 ENCOUNTER — Other Ambulatory Visit: Payer: Self-pay | Admitting: *Deleted

## 2014-09-30 ENCOUNTER — Ambulatory Visit: Payer: Self-pay | Admitting: Hematology

## 2014-09-30 ENCOUNTER — Ambulatory Visit (HOSPITAL_COMMUNITY)
Admission: RE | Admit: 2014-09-30 | Discharge: 2014-09-30 | Disposition: A | Discharge status: Home / Self Care | Payer: Commercial Managed Care - HMO | Source: Ambulatory Visit | Attending: Gastroenterology | Admitting: Gastroenterology

## 2014-09-30 DIAGNOSIS — K219 Gastro-esophageal reflux disease without esophagitis: Secondary | ICD-10-CM | POA: Diagnosis not present

## 2014-09-30 DIAGNOSIS — Z79899 Other long term (current) drug therapy: Secondary | ICD-10-CM | POA: Insufficient documentation

## 2014-09-30 DIAGNOSIS — M25569 Pain in unspecified knee: Secondary | ICD-10-CM | POA: Insufficient documentation

## 2014-09-30 DIAGNOSIS — C189 Malignant neoplasm of colon, unspecified: Secondary | ICD-10-CM

## 2014-09-30 DIAGNOSIS — R918 Other nonspecific abnormal finding of lung field: Secondary | ICD-10-CM | POA: Insufficient documentation

## 2014-09-30 DIAGNOSIS — Z85038 Personal history of other malignant neoplasm of large intestine: Secondary | ICD-10-CM | POA: Diagnosis not present

## 2014-09-30 DIAGNOSIS — M1990 Unspecified osteoarthritis, unspecified site: Secondary | ICD-10-CM | POA: Insufficient documentation

## 2014-09-30 DIAGNOSIS — C182 Malignant neoplasm of ascending colon: Secondary | ICD-10-CM | POA: Diagnosis not present

## 2014-09-30 DIAGNOSIS — Z980 Intestinal bypass and anastomosis status: Secondary | ICD-10-CM | POA: Insufficient documentation

## 2014-09-30 DIAGNOSIS — Z7722 Contact with and (suspected) exposure to environmental tobacco smoke (acute) (chronic): Secondary | ICD-10-CM | POA: Insufficient documentation

## 2014-09-30 DIAGNOSIS — G8929 Other chronic pain: Secondary | ICD-10-CM | POA: Insufficient documentation

## 2014-09-30 DIAGNOSIS — D649 Anemia, unspecified: Secondary | ICD-10-CM | POA: Insufficient documentation

## 2014-09-30 DIAGNOSIS — K5730 Diverticulosis of large intestine without perforation or abscess without bleeding: Secondary | ICD-10-CM | POA: Diagnosis not present

## 2014-09-30 DIAGNOSIS — Z800 Family history of malignant neoplasm of digestive organs: Secondary | ICD-10-CM | POA: Diagnosis not present

## 2014-09-30 DIAGNOSIS — R933 Abnormal findings on diagnostic imaging of other parts of digestive tract: Secondary | ICD-10-CM | POA: Diagnosis present

## 2014-09-30 DIAGNOSIS — Z791 Long term (current) use of non-steroidal anti-inflammatories (NSAID): Secondary | ICD-10-CM | POA: Diagnosis not present

## 2014-09-30 HISTORY — PX: COLONOSCOPY WITH PROPOFOL: SHX5780

## 2014-09-30 SURGERY — COLONOSCOPY WITH PROPOFOL
Anesthesia: Monitor Anesthesia Care

## 2014-09-30 MED ORDER — SPOT INK MARKER SYRINGE KIT
PACK | SUBMUCOSAL | Status: DC | PRN
  Administered 2014-09-30: 2 mL via SUBMUCOSAL

## 2014-09-30 MED ORDER — PROPOFOL INFUSION 10 MG/ML OPTIME
INTRAVENOUS | Status: DC | PRN
  Administered 2014-09-30: 50 ug/kg/min via INTRAVENOUS

## 2014-09-30 MED ORDER — PROPOFOL 10 MG/ML IV BOLUS
INTRAVENOUS | Status: AC
  Filled 2014-09-30: qty 20

## 2014-09-30 MED ORDER — SODIUM CHLORIDE 0.9 % IV SOLN: INTRAVENOUS | Status: DC

## 2014-09-30 MED ORDER — LACTATED RINGERS IV SOLN
INTRAVENOUS | Status: DC
  Administered 2014-09-30: 1000 mL via INTRAVENOUS
  Administered 2014-09-30: 08:00:00 via INTRAVENOUS

## 2014-09-30 MED ORDER — PROPOFOL 500 MG/50ML IV EMUL
INTRAVENOUS | Status: DC | PRN
  Administered 2014-09-30 (×3): 30 mg via INTRAVENOUS

## 2014-09-30 MED ORDER — SPOT INK MARKER SYRINGE KIT
PACK | SUBMUCOSAL | Status: AC
  Filled 2014-09-30: qty 5

## 2014-09-30 SURGICAL SUPPLY — 22 items

## 2014-09-30 NOTE — H&P (View-Only) (Signed)
- [place]  [phone]) [phone] Fax:([phone]  Clinic New Consult Note   Patient Care Team: Prince Solian, MD as PCP - General (Internal Medicine) Gaye Pollack, MD as Consulting Physician (Cardiothoracic Surgery) 09/27/2014  CHIEF COMPLAINTS/PURPOSE OF CONSULTATION:  Stage II colon cancer    Colon cancer   04/09/2014 Procedure Colonoscopy per Dr. Silvano Rusk: Large fleshy mass sigmoid colon 25 cm from anus. Could not pass through.   04/09/2014 Tumor Marker CEA =11.7   04/12/2014 Imaging CT C/A/P:sigmoid colon mass;several small areas of soft tissue nodularity in peritoneal cavity; small attenuation structure posterior right hepatic lobe; 2 stable nodules in lungs dating back to 2014. No acute findings.   05/17/2014 Initial Diagnosis Colon cancer-presented with heme + stool and more frequent BMs   05/17/2014 Surgery Robot assisted sigmoidectomy   05/17/2014 Pathologic Stage pT3,pN0,pMX--Grade 1--0/13 nodes +--MMR normal   05/17/2014 Clinical Stage Stage II    HISTORY OF PRESENTING ILLNESS:  Heidi Marquez 81 y.o. female is here because of recently diagnosed a stage II sigmoid colon cancer.  She had annual physical with her PCP in early Feb this year, and stool OB (+), no overt GI bleeding, CBC was normal. She had noticed some loose BM before that, but no nausea, loss of appetite, weight loss or other symoptoms. Colonoscopy showed a large mass in the sigmoid colon 25 cm from anus. Biopsy was taken which showed tubulovillous adenoma, no malignancy. She was referred to surgeon Dr. Marcello Moores and underwent robotic-assisted sigmoidectomy on 4/4. She tolerated the procedure well, and was discharged home afterwards.  She has recovered well from the surgery 3 weeks ago, she has mild fatigue, but otherwise doing very well without other complains. She had 2-3 episodes of bleeding after the surgery, which was related to her herromods. She is eating well. 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   She has chronic knee pain she takes ibuprofen once daily.    INTERIM HISTORY: Heidi Marquez returns for follow-up and he discussed her restaging CT scan. She is clinically stable, no new complaints since I saw her 2 weeks ago. She does report slightly more fatigued lately, also able to function very well at home. She is a caregiver for her husband, who has moderate dementia. She has a daughter lives in the town.   MEDICAL HISTORY:  Past Medical History  Diagnosis Date  . Diverticulosis of colon     severe in sigmoid but diffusley throughout colon  on 2009 colonoscopy  . Internal hemorrhoids   . Hematochezia 2009  . Benign esophageal stricture 04/2007    EGD with dilatation.  Marland Kitchen GERD (gastroesophageal reflux disease)   . HH (hiatus hernia)   . Macular degeneration   . Arthritis   . IBS (irritable bowel syndrome)   . Epistaxis ~ 2005    required cauterization twice.   . Reflux esophagitis   . Allergy   . Cataract     ight eye starting  . Dysrhythmia     patient states for last year, when coming up steps , has palpitations-heart beats fast    SURGICAL HISTORY: Past Surgical History  Procedure Laterality Date  . Abdominal hysterectomy      partial with the bladder tack  . Bladder tack    . Retinal laser surgery    . Inguinal hernia repair  early 1990s    right  . Basal cell resection  08/2011, 11/2011  initially biopsy 08/3011.  repeat excision right nose with grafting from the skin behind the right ear  . Colonoscopy    . Esophagogastroduodenoscopy    . Upper gastrointestinal endoscopy    . Appendectomy      patient states does not know about that    SOCIAL HISTORY: Social History   Social History  . Marital Status: Married    Spouse Name: N/A  . Number of Children: N/A  . Years of Education: N/A   Occupational History  . Not on file.   Social History Main Topics  . Smoking status: Passive Smoke  Exposure - Never Smoker  . Smokeless tobacco: Never Used  . Alcohol Use: No  . Drug Use: No  . Sexual Activity: Not on file   Other Topics Concern  . Not on file   Social History Narrative   Married, husband is disabled and she is caregiver   Has #1 child (daughter) living. 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 Colon cancer Paternal Grandfather 60    colon cancer   . Esophageal cancer Neg Hx   . Rectal cancer Neg Hx   . Stomach cancer Neg Hx   . Stroke Father   . Heart attack Son 50    died of heart attack     ALLERGIES:  is allergic to penicillins; sulfonamide derivatives; and amoxicillin-pot clavulanate.  MEDICATIONS:  Current Outpatient Prescriptions  Medication Sig Dispense Refill  . alendronate (FOSAMAX) 70 MG tablet Take 70 mg by mouth every 7 (seven) days. Tuesdays. Take with a full glass of water on an empty stomach.    . Ascorbic Acid (VITAMIN C) 1000 MG tablet Take 1,000 mg by mouth daily.    . Biotin 1000 MCG tablet Take 1,000 mcg by mouth daily.     . CALCIUM & MAGNESIUM CARBONATES PO Take 1 tablet by mouth daily.    . cholecalciferol (VITAMIN D) 1000 UNITS tablet Take 1,000 Units by mouth daily.    . glucosamine-chondroitin 500-400 MG tablet Take 1 tablet by mouth daily.     . Homeopathic Products ([employer] LEG CRAMPS PAIN RELIEF PO) Take 1 tablet by mouth daily as needed (for leg cramps).    . HYDROcodone-acetaminophen (NORCO/VICODIN) 5-325 MG per tablet Take 1-2 tablets by mouth every 4 (four) hours as needed for moderate pain. (Patient not taking: Reported on 09/07/2014) 30 tablet 0  . ibuprofen (ADVIL,MOTRIN) 200 MG tablet Take 200 mg by mouth every morning.    . loratadine (CLARITIN) 10 MG tablet Take 10 mg by mouth daily.    . Multiple Vitamin (MULTIVITAMIN) tablet Take 1 tablet by mouth daily.    . naproxen sodium (ANAPROX) 220 MG tablet Take  220 mg by mouth daily.     . vitamin B-12 (CYANOCOBALAMIN) 1000 MCG tablet Take 1,000 mcg by mouth daily.      No current facility-administered medications for this visit.    REVIEW OF SYSTEMS:   Constitutional: Denies fevers, chills or abnormal night sweats Eyes: Denies blurriness of vision, double vision or watery eyes Ears, nose, mouth, throat, and face: Denies mucositis or sore throat Respiratory: Denies cough, dyspnea or wheezes Cardiovascular: Denies palpitation, chest discomfort or lower extremity swelling Gastrointestinal:  Denies nausea, heartburn or change in bowel habits Skin: Denies abnormal skin rashes Lymphatics: Denies new lymphadenopathy or easy   bruising Neurological:Denies numbness, tingling or new weaknesses Behavioral/Psych: Mood is stable, no new changes  All other systems were reviewed with the patient and are negative.  PHYSICAL EXAMINATION: ECOG PERFORMANCE STATUS: 0 - Asymptomatic  Filed Vitals:   09/27/14 1344  BP: 141/77  Pulse: 89  Temp: 98.2 F (36.8 C)  Resp: 18   Filed Weights   09/27/14 1344  Weight: 146 lb 8 oz (66.452 kg)    GENERAL:alert, no distress and comfortable SKIN: skin color, texture, turgor are normal, no rashes or significant lesions EYES: normal, conjunctiva are pink and non-injected, sclera clear OROPHARYNX:no exudate, no erythema and lips, buccal mucosa, and tongue normal  NECK: supple, thyroid normal size, non-tender, without nodularity LYMPH:  no palpable lymphadenopathy in the cervical, axillary or inguinal LUNGS: clear to auscultation and percussion with normal breathing effort HEART: regular rate & rhythm and no murmurs and no lower extremity edema ABDOMEN:abdomen soft, non-tender and normal bowel sounds Musculoskeletal:no cyanosis of digits and no clubbing  PSYCH: alert & oriented x 3 with fluent speech NEURO: no focal motor/sensory deficits  LABORATORY DATA:  I have reviewed the data as listed CBC Latest Ref Rng  09/07/2014 06/08/2014 05/20/2014  WBC 3.9 - 10.3 10e3/uL 4.7 5.9 10.8(H)  Hemoglobin 11.6 - 15.9 g/dL 9.4(L) 10.1(L) 11.2(L)  Hematocrit 34.8 - 46.6 % 29.8(L) 30.9(L) 34.8(L)  Platelets 145 - 400 10e3/uL 156 196 168     Recent Labs  04/09/14 1507  05/18/14 0525 05/19/14 0450 05/20/14 0505 06/08/14 1535 09/07/14 0946  NA 140  < > 139 139 137 145 144  K 4.1  < > 3.2* 3.7 3.5 4.0 4.2  CL 105  < > 103 106 104  --   --   CO2 32  < > 29 27 24 25 29  GLUCOSE 93  < > 108* 104* 105* 112 86  BUN 15  < > 10 9 11 19.3 25.7  CREATININE 0.92  < > 0.84 0.70 0.67 1.1 0.9  CALCIUM 9.0  < > 7.8* 8.1* 8.2* 9.3 9.2  GFRNONAA  --   < > 65* 81* 82*  --   --   GFRAA  --   < > 75* >90 >90  --   --   PROT 6.7  --   --   --   --  6.3* 6.3*  ALBUMIN 4.1  --   --   --   --  3.5 3.6  AST 22  --   --   --   --  22 22  ALT 14  --   --   --   --  12 16  ALKPHOS 98  --   --   --   --  81 98  BILITOT 0.7  --   --   --   --  0.41 0.59  < > = values in this interval not displayed.  CEA  Status: Finalresult Visible to patient:  Not Released Nextappt: 12/15/2014 at 09:30 AM in Oncology ([facility]) Dx:  Colon cancer              Ref Range 2wk ago  3mo ago  5mo ago     CEA 0.0 - 5.0 ng/mL 29.3 (H) 16.0 (H) 11.7 (H)         Pathology report: Diagnosis 1. Colon, segmental resection for tumor, sigmoid, open end proximal - INVASIVE ADENOCARCINOMA WITH EXTRACELLULAR MUCIN, INVADING THROUGH THE MUSCULARIS PROPRIA INTO PERICOLONIC FATTY TISSUE. - THIRTEEN LYMPH   NODES, NEGATIVE FOR METASTATIC CARCINOMA (0/13). - MULTIPLE DIVERTICULA. - RESECTION MARGINS, NEGATIVE FOR ATYPIA OR MALIGNANCY. - PLEASE SEE ONCOLOGY TEMPLATE FOR DETAILS. 2. Colon, resection margin (donut), sigmoid - BENIGN COLONIC MUCOSA, NO EVIDENCE OF DYSPLASIA OR MALIGNANCY. Specimen: Left colon. Procedure: Segmental resection. Tumor site: Sigmoid colon. Specimen integrity: Intact. Macroscopic intactness of  mesorectum: N/A Macroscopic tumor perforation: No Invasive tumor: Invasive adenocarcinoma with extracellular mucin. Maximum size: 8.0 cm, gross measurement. Histologic type(s): Invasive adenocarcinoma with extracellular mucin. Histologic grade and differentiation: G1: well differentiated/low grade Type of polyp in which invasive carcinoma arose: Villous adenoma. Microscopic extension of invasive tumor: Invading through the muscularis propria into pericolonic fatty tissue. Lymph-Vascular invasion: Not identified. Peri-neural invasion: Not identified. Tumor deposit(s) (discontinuous extramural extension): None. Resection margins: Negative. Proximal margin: 9.6 cm. Distal margin: 9.6 cm. Mesenteric margin (sigmoid and transverse): 6 cm. Treatment effect (neo-adjuvant therapy): No Additional polyp(s): N/A Non-neoplastic findings: Prominent Crohn-like reaction around the tumor. Lymph nodes: number examined 13; number positive: 0 Pathologic Staging: pT3, pN0, pMX  MMR: NORMAL MSI: STABLE  RADIOGRAPHIC STUDIES: I have personally reviewed the radiological images as listed and agreed with the findings in the report.  CT chest, abdomen and pelvis with contrast 09/16/2014 IMPRESSION: Annular constricting masslike soft tissue density involving the cecum measuring 2.3 x 3.3 cm, highly suspicious for metachronous primary colon carcinoma. Colonoscopy is recommended for further evaluation.  Increased size of 1.5 cm right pericolonic soft tissue nodule or lymph node, consistent with metastatic disease. Small less than 1 cm right lower quadrant mesenteric lymph nodes remain stable; local metastatic disease cannot be excluded.  No liver metastases identified.  Two new ill-defined pulmonary nodules in right upper lobe and left lower lobe, suspicious for pulmonary metastases.  Stable incidental findings including 4.0 cm ascending thoracic aortic aneurysm, left thyroid lobe nodule, and  cholelithiasis  ASSESSMENT & PLAN:  81 year old Caucasian female, with past medical history of colon diverticulosis and arthritis, but otherwise healthy and active, who was found to have a large sigmoid tumor, status post sigmoidectomy.  1. Sigmoid colon adenocarcinoma, pT3N0M0, stage II, G1, MSI stable  -I reviewed her surgical pathology findings, and her pre-op CT scan findings with her in great details. -we discussed that this is likely cured by the second complete surgical resection, however she still has a risk of cancer recurrence, which is every 20% for stage II colon cancer -her cancer does not have high risks features, such as T4 lesion, perforation, poor differentiation, lymphovascular invasion or perineural  Invasion, so I do not recommend adjuvant chemotherapy. The benefit of adjuvant chemotherapy in stage II colon cancer without high-risk features are uncertain. -Her CEA did not return to normal after surgery, and has been trending up on repeated testing, concerning for colon cancer recurrence.  -I reviewed her restaging CT chest abdomen and pelvis, which showed a probable mass in the cecum, possible second primary colon cancer. -I'll refer her back to Dr. Carlean Purl for repeat colonoscopy. Her prior colonoscopy in February could not pass the obstruction from the sigmoid colon mass, and did not examine her entire colon.    2. Worsening anemia -Her hemoglobin was normal before surgery in April, anemia slightly getting worse in the past 3 months. Hemoglobin 9.4 today, MCV normal. -Her arm level was elevated at 237, but her ferritin was 11, results are not consistent, I'll repeat on next visit. -Possible bleeding from her new primary colon cancer  3. Lung nodules -She has two small right lung nodules which were  discovered on the CT scan,  RUL nodule slightly bigger on recent CT -She has been follow-up with Dr. Cyndia Bent, I will discuss with him.  -she never smoked, low risk for lung  cancer -We will follow up on subsequent CT scan in 2017   Plan -refer to GI Dr. Carlean Purl for colonoscopy -I will see her after her colonoscopy and possible biopsy    All questions were answered. The patient knows to call the clinic with any problems, questions or concerns. I spent 20 minutes counseling the patient face to face. The total time spent in the appointment was 25 minutes and more than 50% was on counseling.     Truitt Merle, MD 09/27/2014 1:47 PM

## 2014-09-30 NOTE — Transfer of Care (Signed)
Immediate Anesthesia Transfer of Care Note  Patient: Heidi Marquez  Procedure(s) Performed: Procedure(s): COLONOSCOPY WITH PROPOFOL (N/A)  Patient Location: PACU  Anesthesia Type:MAC  Level of Consciousness: sedated, patient cooperative and responds to stimulation  Airway & Oxygen Therapy: Patient Spontanous Breathing and Patient connected to face mask oxygen  Post-op Assessment: Report given to RN and Post -op Vital signs reviewed and stable  Post vital signs: Reviewed and stable  Last Vitals:  Filed Vitals:   09/30/14 0712  BP: 167/61  Temp: 36.7 C  Resp: 21    Complications: No apparent anesthesia complications

## 2014-09-30 NOTE — Anesthesia Postprocedure Evaluation (Signed)
  Anesthesia Post-op Note  Patient: Heidi Marquez  Procedure(s) Performed: Procedure(s): COLONOSCOPY WITH PROPOFOL (N/A)  Patient Location: PACU  Anesthesia Type:MAC  Level of Consciousness: awake, alert  and oriented  Airway and Oxygen Therapy: Patient Spontanous Breathing  Post-op Pain: none  Post-op Assessment: Post-op Vital signs reviewed, Patient's Cardiovascular Status Stable, Respiratory Function Stable, Patent Airway, No signs of Nausea or vomiting and Pain level controlled              Post-op Vital Signs: Reviewed and stable  Last Vitals:  Filed Vitals:   09/30/14 0839  BP: 160/73  Pulse: 63  Temp: 36.7 C  Resp: 23    Complications: No apparent anesthesia complications

## 2014-09-30 NOTE — Discharge Instructions (Signed)

## 2014-09-30 NOTE — Op Note (Signed)
[facility] [place], [address]   COLONOSCOPY PROCEDURE REPORT  PATIENT: Heidi Marquez, Heidi Marquez  MR#: [id number] BIRTHDATE: 05-03-1933 , 80  yrs. old GENDER: female ENDOSCOPIST: Milus Banister, MD PROCEDURE DATE:  09/30/2014 PROCEDURE:   Colonoscopy with biopsy and Submucosal injection, any substance First Screening Colonoscopy - Avg.  risk and is 50 yrs.  old or older - No.  Prior Negative Screening - Now for repeat screening. N/A  History of Adenoma - Now for follow-up colonoscopy & has been > or = to 3 yrs.  N/A  high risk ASA CLASS:   Class II INDICATIONS:T3N0M0 sigmoid colon cancer resected robotic assist 05/2014; originally diagnosed Dr.  Carlean Purl colonoscopy that was limited by malignant sigmoid obstruction; now with rising CEA, abnormal CT scan. MEDICATIONS: Monitored anesthesia care  DESCRIPTION OF PROCEDURE:   After the risks benefits and alternatives of the procedure were thoroughly explained, informed consent was obtained.  The digital rectal exam revealed no abnormalities of the rectum.   The Pentax Ped Colon [id number] endoscope was introduced through the anus and advanced to the cecum, which was identified by both the appendix and ileocecal valve. No adverse events experienced.   The quality of the prep was good.  The instrument was then slowly withdrawn as the colon was fully examined. Estimated blood loss is zero unless otherwise noted in this procedure report.   COLON FINDINGS: The sigmoid anastomosis was located at 1 and appeared normal.  There were diverticular changes throughout the colon.  There was an obvious malignant mass located in the ascending colon, about 6-7cm  distal to the cecum.  The mass was 4cm across, nearly circumferential however I was able to advance the adult colonoscope through the mass and into the cecum.  The mass was biopsied extensively and then the distal edge of the mass was labled with submucosal  injection of SPOT.  The examination was othewise normal.  Retroflexed views revealed no abnormalities. The time to cecum = NA      Withdrawal time = NA        The scope was withdrawn and the procedure completed. COMPLICATIONS: There were no immediate complications.  ENDOSCOPIC IMPRESSION: Obviously malignant mass in the ascending colon. This was biopsied and labled with injection of SPOT.  The sigmoid anastomosis was normal appearing. This likely represents a synchronous cancer. Await final pathology results.  RECOMMENDATIONS: I will communicate this with Drs. Delilah Shan and Marcello Moores.  eSigned:  Milus Banister, MD 09/30/2014 8:44 AM

## 2014-09-30 NOTE — Interval H&P Note (Signed)
History and Physical Interval Note:  09/30/2014 7:35 AM  Heidi Marquez  has presented today for surgery, with the diagnosis of colon cancer   The various methods of treatment have been discussed with the patient and family. After consideration of risks, benefits and other options for treatment, the patient has consented to  Procedure(s): COLONOSCOPY WITH PROPOFOL (N/A) as a surgical intervention .  The patient's history has been reviewed, patient examined, no change in status, stable for surgery.  I have reviewed the patient's chart and labs.  Questions were answered to the patient's satisfaction.     Milus Banister

## 2014-10-01 ENCOUNTER — Encounter (HOSPITAL_COMMUNITY): Payer: Self-pay | Admitting: Gastroenterology

## 2014-10-08 ENCOUNTER — Telehealth: Payer: Self-pay | Admitting: Hematology

## 2014-10-08 ENCOUNTER — Other Ambulatory Visit: Payer: Self-pay | Admitting: Hematology

## 2014-10-08 NOTE — Telephone Encounter (Signed)
pt cld & left voicemail-cld pt back and left message and adv to call us back on Monday-she needs to speak with nurse intregards to information she is needing

## 2014-10-11 ENCOUNTER — Ambulatory Visit: Payer: Self-pay | Admitting: Hematology

## 2014-10-11 ENCOUNTER — Telehealth: Payer: Self-pay | Admitting: Hematology

## 2014-10-11 NOTE — Telephone Encounter (Signed)
per pof to sch pt appt-cld & spoke to pt and adv of appt on 8/31-pt understood

## 2014-10-12 ENCOUNTER — Other Ambulatory Visit: Payer: Self-pay | Admitting: General Surgery

## 2014-10-12 ENCOUNTER — Telehealth: Payer: Self-pay | Admitting: *Deleted

## 2014-10-12 NOTE — H&P (Signed)
Zachery Conch 10/12/2014 10:12 AM Location: [facility] Patient #: [id number] DOB: 31-Mar-1933 Married / Language: Cleophus Molt / Race: White Female  History of Present Illness Leighton Ruff MD; 9/38/1017 1:01 PM) Patient words: discuss surgery.  The patient is a 81 year old female who presents with colorectal cancer. This is a 81 year old who underwent somewhat urgent surgery earlier this year for a partially obstructing sigmoid mass. A colonoscopy previously was unable to be passed through the mass. She tolerated the surgery well and has recovered. She underwent follow-up CT scans which showed a mass in her cecum. Follow-up colonoscopy revealed invasive adenocarcinoma of the ascending colon. This was tattooed by Dr. Carlean Purl. She did well with the last surgery but did take several months to get back to her baseline. She is not eager to undergo this again but realizes the importance of removing what appears to be a synchronous cancer.   Problem List/Past Medical Leighton Ruff, MD; 06/22/2583 1:02 PM) MASS OF COLON (569.89  K63.9) MALIGNANT NEOPLASM OF ASCENDING COLON (153.6  C18.2)  Other Problems Leighton Ruff, MD; 2/77/8242 1:02 PM) MALIGNANT NEOPLASM OF SIGMOID COLON (153.3  C18.7) BLEEDING INTERNAL HEMORRHOIDS (455.2  K64.8) Back Pain  Allergies (Sonya Bynum, CMA; 10/12/2014 10:12 AM) Penicillins  Medication History Leighton Ruff, MD; 3/53/6144 1:02 PM) Anusol-HC (2.5% Cream, 1 (one) Cream Rectal twice daily, as needed, Taken starting 06/01/2014) Active. Claritin (10MG  Capsule, Oral) Active. Boniva (2.5MG  Tablet, Oral) Active. (pt unsure of dose) Medications Reconciled Neomycin Sulfate (500MG  Tablet, 2 (two) Tablet Oral SEE NOTE, Taken starting 10/12/2014) Active. (TAKE TWO TABLETS AT 2 PM, 3 PM, AND 10 PM THE DAY PRIOR TO SURGERY) Flagyl (500MG  Tablet, 2 (two) Tablet Oral SEE NOTE, Taken starting 10/12/2014) Active. (Take at 2pm, 3pm, and 10pm the  day prior to your colon operation)  Social History Leighton Ruff, MD; 04/27/4006 1:02 PM) No alcohol use Tobacco use Never smoker. No caffeine use No drug use  Family History Leighton Ruff, MD; 6/76/1948 1:02 PM) Colon Cancer Family Members In General. Hypertension Son.  Pregnancy / Birth History Leighton Ruff, MD; 9/32/6712 1:02 PM) Para 2 Gravida 2 Maternal age 40-20 Age of menopause 25-55 Age at menarche 25 years.  Vitals (Sonya Bynum CMA; 10/12/2014 10:13 AM) 10/12/2014 10:12 AM Weight: 145 lb Height: 63in Body Surface Area: 1.71 m Body Mass Index: 25.69 kg/m Temp.: 97.11F(Temporal)  Pulse: 79 (Regular)  BP: 128/76 (Sitting, Left Arm, Standard)    Physical Exam Leighton Ruff MD; 4/58/0998 1:02 PM) General Mental Status-Alert. General Appearance-Consistent with stated age. Hydration-Well hydrated. Voice-Normal.  Head and Neck Head-normocephalic, atraumatic with no lesions or palpable masses. Trachea-midline. Thyroid Gland Characteristics - normal size and consistency.  Eye Eyeball - Bilateral-Extraocular movements intact. Sclera/Conjunctiva - Bilateral-No scleral icterus.  Chest and Lung Exam Chest and lung exam reveals -quiet, even and easy respiratory effort with no use of accessory muscles and on auscultation, normal breath sounds, no adventitious sounds and normal vocal resonance. Inspection Chest Wall - Normal. Back - normal.  Cardiovascular Cardiovascular examination reveals -normal heart sounds, regular rate and rhythm with no murmurs and normal pedal pulses bilaterally.  Abdomen Inspection Inspection of the abdomen reveals - No Hernias. Skin - Scar - Right Lower Quadrant. Note: Pfannenstiel scar. Palpation/Percussion Palpation and Percussion of the abdomen reveal - Soft, Non Tender, No Rebound tenderness, No Rigidity (guarding) and No hepatosplenomegaly. Auscultation Auscultation of the abdomen  reveals - Bowel sounds normal.  Neurologic Neurologic evaluation reveals -alert and oriented x 3 with no impairment  of recent or remote memory. Mental Status-Normal.  Musculoskeletal Normal Exam - Left-Upper Extremity Strength Normal and Lower Extremity Strength Normal. Normal Exam - Right-Upper Extremity Strength Normal and Lower Extremity Strength Normal.    Assessment & Plan Leighton Ruff MD; 4/49/7530 1:03 PM) MALIGNANT NEOPLASM OF ASCENDING COLON (153.6  C18.2) Impression: 81 year old female with a newly identified right sided colon cancer approximately 6 months status post sigmoidectomy for left-sided colon cancer. We discussed minimally invasive techniques to resect this right-sided tumor. We discussed the typical recovery time and operative course. All questions were answered. The surgery and anatomy were described to the patient as well as the risks of surgery and the possible complications. These include: Bleeding, deep abdominal infections and possible wound complications such as hernia and infection, damage to adjacent structures, leak of surgical connections, which can lead to other surgeries and possibly an ostomy, possible need for other procedures, such as abscess drains in radiology, possible prolonged hospital stay, possible diarrhea from removal of part of the colon, possible constipation from narcotics, possible bowel, bladder or sexual dysfunction if having rectal surgery, prolonged fatigue/weakness or appetite loss, possible early recurrence of of disease, possible complications of their medical problems such as heart disease or arrhythmias or lung problems, death (less than 1%). I believe the patient understands and wishes to proceed with the surgery.

## 2014-10-12 NOTE — Telephone Encounter (Signed)
Pt called and left message wanting to know if pt should keep office visit appt with Dr. Burr Medico on 10/13/14.   Pt stated she saw her surgeon Dr. Marcello Moores this am, and had all information covered by the surgeon. Pt also was given information from Dr. Cyndia Bent about her lung issue by her surgeon.   Pt would like to know if appt for 8/31 with Dr. Burr Medico is necessary. Pt's   Phone     Cell    [phone]   ;     Home      [phone].

## 2014-10-13 ENCOUNTER — Ambulatory Visit: Payer: Self-pay | Admitting: Hematology

## 2014-10-13 ENCOUNTER — Telehealth: Payer: Self-pay | Admitting: Hematology

## 2014-10-13 NOTE — Telephone Encounter (Signed)
per Dr Burr Medico remove pt from her sch today-pt having surgery and will r/s at a later date

## 2014-10-13 NOTE — Telephone Encounter (Signed)
I called the patient back. I have spoken with Dr. Marcello Moores and Dr. Cyndia Bent, we think her lung nodules are stable over 2 years, unlikely related to her colon cancer. Dr. Marcello Moores has offered her colon surgery. I answered her questions, and she does not need to come in to see me today.  I asked her to call me back after surgery, and I'll reschedule her follow-up, to see her in 2-3 weeks after surgery.  Truitt Merle  10/13/2014

## 2014-11-08 ENCOUNTER — Inpatient Hospital Stay (HOSPITAL_COMMUNITY): Admission: RE | Admit: 2014-11-08 | Payer: Self-pay | Source: Ambulatory Visit

## 2014-11-09 ENCOUNTER — Encounter (HOSPITAL_COMMUNITY)
Admission: RE | Admit: 2014-11-09 | Discharge: 2014-11-09 | Disposition: A | Discharge status: Home / Self Care | Payer: Commercial Managed Care - HMO | Source: Ambulatory Visit | Attending: General Surgery | Admitting: General Surgery

## 2014-11-09 NOTE — Progress Notes (Signed)
Pt arrived for PAT visit as scheduled; surgery time changed till 12/08/2014; pt to be notified of rescheduling preop appt closer to new surgery date. Pt in agreement.

## 2014-11-19 NOTE — Progress Notes (Signed)
Dr Hubbard Robinson reviewed pts H&P an aware of CT/PET scan findings. Have reviewed Dr Vivi Martens OV note from 01/2014.  No further orders given. Anesthesia to see pt day of surgery.

## 2014-12-01 ENCOUNTER — Ambulatory Visit (INDEPENDENT_AMBULATORY_CARE_PROVIDER_SITE_OTHER): Payer: Commercial Managed Care - HMO | Admitting: Surgery

## 2014-12-01 ENCOUNTER — Encounter: Payer: Self-pay | Admitting: Surgery

## 2014-12-01 VITALS — BP 163/100 | HR 80 | Resp 16 | Ht 63.0 in | Wt 161.0 lb

## 2014-12-01 DIAGNOSIS — I712 Thoracic aortic aneurysm, without rupture, unspecified: Secondary | ICD-10-CM

## 2014-12-01 DIAGNOSIS — R918 Other nonspecific abnormal finding of lung field: Secondary | ICD-10-CM | POA: Diagnosis not present

## 2014-12-01 NOTE — Progress Notes (Signed)
HPI:  Heidi Marquez returns for follow up of a small ascending aortic aneurysm and a RUL lung nodule. Since I last saw her she underwent sigmoid colon resection for colon cancer on 05/17/2014 and has been diagnosed with a second primary colon cancer in the ascending colon and is scheduled for another colon resection in a couple weeks. She has had a small RUL spiculated nodule that was diagnosed in 09/2012 at which time it was 14 x 9 x 12 mm. A PET scan at that time showed no hypermetabolic activity and we have been following it since. It has not changed significantly in size on successive CT scans. She has felt fine with no cough or sputum production, no hemoptysis. She denies fever. Appetite has been good and she denies weight loss.  Current Outpatient Prescriptions  Medication Sig Dispense Refill  . alendronate (FOSAMAX) 70 MG tablet Take 70 mg by mouth every 7 (seven) days. Tuesdays. Take with a full glass of water on an empty stomach.    . Ascorbic Acid (VITAMIN C) 1000 MG tablet Take 1,000 mg by mouth daily.    . Biotin 1000 MCG tablet Take 1,000 mcg by mouth daily.     Marland Kitchen CALCIUM & MAGNESIUM CARBONATES PO Take 1 tablet by mouth daily.    . cholecalciferol (VITAMIN D) 1000 UNITS tablet Take 1,000 Units by mouth daily.    Marland Kitchen glucosamine-chondroitin 500-400 MG tablet Take 1 tablet by mouth daily.     . Homeopathic Products ([facility] LEG CRAMPS PAIN RELIEF PO) Take 1 tablet by mouth daily as needed (for leg cramps).    Marland Kitchen ibuprofen (ADVIL,MOTRIN) 200 MG tablet Take 200 mg by mouth daily as needed for moderate pain.     Marland Kitchen loratadine (CLARITIN) 10 MG tablet Take 10 mg by mouth daily.    . Multiple Vitamin (MULTIVITAMIN) tablet Take 1 tablet by mouth daily.    . vitamin B-12 (CYANOCOBALAMIN) 1000 MCG tablet Take 1,000 mcg by mouth daily.      No current facility-administered medications for this visit.     Physical Exam: BP 163/100 mmHg  Pulse 80  Resp 16  Ht 5\' 3"  (1.6 m)  Wt 161 lb (73.029  kg)  BMI 28.53 kg/m2  SpO2 96% She looks well  Lungs are clear  There is no cervical or supraclavicular adenopathy. The left thyroid lobe is slighyly prominent.  Diagnostic Tests:  CLINICAL DATA: Followup colon carcinoma. Rising serum CEA levels. Evaluate for metastatic disease.  EXAM: CT CHEST, ABDOMEN, AND PELVIS WITH CONTRAST  TECHNIQUE: Multidetector CT imaging of the chest, abdomen and pelvis was performed following the standard protocol during bolus administration of intravenous contrast.  CONTRAST: 181mL OMNIPAQUE IOHEXOL 300 MG/ML SOLN  COMPARISON: 04/12/2014  FINDINGS: CT CHEST FINDINGS  Mediastinum/Lymph Nodes: No pathologically enlarged lymph nodes identified. Left thyroid lobe nodule with mixed solid and cystic components remains stable. Mild cardiomegaly is stable. 4.0 cm ascending thoracic aortic aneurysm is also stable.  Lungs/Pleura: A 13 mm ill-defined pulmonary nodule is seen in the posterior right upper lobe on image 27, and a 9 mm ill-defined subpleural pulmonary nodule seen in the left lower lobe on image 30. These are new since previous exam. A few other tiny scattered less than 5 mm bilateral pulmonary nodular densities remains stable no evidence of pleural effusion.  Musculoskeletal/Soft Tissues: No suspicious bone lesions or other significant chest wall abnormality.  CT ABDOMEN AND PELVIS FINDINGS  Hepatobiliary: No masses or other significant abnormality identified.  A few tiny calcified gallstones are again seen, without evidence of cholecystitis.  Pancreas: No mass, inflammatory changes, or other significant abnormality identified.  Spleen: Within normal limits in size. Two tiny sub-cm low-attenuation splenic lesions remain stable.  Adrenals: No masses identified.  Kidneys/Urinary Tract: No evidence of masses or hydronephrosis.  Stomach/Bowel/Peritoneum: Sigmoid colon anastomosis is seen. Diffuse colonic  diverticulosis is again noted. An annular constricting masslike soft tissue density is now seen involving the cecum which measures approximately 2.3 x 3.3 cm on image 82/series 2. This is highly suspicious for a metachronous primary colon carcinoma. No evidence of bowel obstruction.  Increased size of ill-defined peritoneal soft tissue nodule or lymph node is seen in the right paracolic gutter on image 97/WYOVZC 2, which now measures 1.5 cm compared to 0.8 cm previously. Tiny less than 1 cm right lower quadrant mesenteric lymph nodes remain stable.  Vascular/Lymphatic: No other pathologically enlarged lymph nodes identified. No abdominal aortic aneurysm or other significant retroperitoneal abnormality demonstrated.  Reproductive: Prior hysterectomy noted. Adnexal regions are unremarkable in appearance.  Other: None.  Musculoskeletal: No suspicious bone lesions identified.  IMPRESSION: Annular constricting masslike soft tissue density involving the cecum measuring 2.3 x 3.3 cm, highly suspicious for metachronous primary colon carcinoma. Colonoscopy is recommended for further evaluation.  Increased size of 1.5 cm right pericolonic soft tissue nodule or lymph node, consistent with metastatic disease. Small less than 1 cm right lower quadrant mesenteric lymph nodes remain stable; local metastatic disease cannot be excluded.  No liver metastases identified.  Two new ill-defined pulmonary nodules in right upper lobe and left lower lobe, suspicious for pulmonary metastases.  Stable incidental findings including 4.0 cm ascending thoracic aortic aneurysm, left thyroid lobe nodule, and cholelithiasis.   Electronically Signed  By: Earle Gell M.D.  On: 09/16/2014 10:42        Impression:  1. She has stable mild enlargement of her ascending aortic at 4.0 cm which does not require any treatment. There is no contraindication to proceeding with colon resection  as far as this is concerned. Long term good blood pressure control and continued follow up is indicated.  2. The RUL lung nodule is stable in size since 09/2012 with no hypermetabolic activity on PET scan at that time and is most likely to be benign. There is a new ill-defined 9 mm subpleural nodule seen in the LLL that is of unclear significance and will require follow up. There is no contraindication to proceeding with colon resection as far as these lesions are concerned.  Plan:  I will see her back in 6 months with a repeat CT of the chest to follow up on her aorta and the lung nodules. If she is going to need an abdominal CT around the same time to restage her colon cancer then they can be coordinated.   Gaye Pollack, MD [facility] [phone]

## 2014-12-02 NOTE — Patient Instructions (Addendum)
YOUR PROCEDURE IS SCHEDULED ON :  12/08/14  REPORT TO [facility] MAIN ENTRANCE FOLLOW SIGNS TO EAST ELEVATOR - GO TO 3rd FLOOR CHECK IN AT 3 EAST NURSES STATION (SHORT STAY) AT:  7:00 AM  CALL THIS NUMBER IF YOU HAVE PROBLEMS THE MORNING OF SURGERY [phone]  REMEMBER:ONLY 1 PER PERSON MAY GO TO SHORT STAY WITH YOU TO GET READY THE MORNING OF YOUR SURGERY  DO NOT EAT FOOD OR DRINK LIQUIDS AFTER MIDNIGHT  FOLLOW BOWEL PREP INSTRUCTIONS  TAKE THESE MEDICINES THE MORNING OF SURGERY: CLARITIN IF NEEDED  YOU MAY NOT HAVE ANY METAL ON YOUR BODY INCLUDING HAIR PINS AND PIERCING'S. DO NOT WEAR JEWELRY, MAKEUP, LOTIONS, POWDERS OR PERFUMES. DO NOT WEAR NAIL POLISH. DO NOT SHAVE 48 HRS PRIOR TO SURGERY. MEN MAY SHAVE FACE AND NECK.  DO NOT [place]. [facility] IS NOT RESPONSIBLE FOR VALUABLES.  CONTACTS, DENTURES OR PARTIALS MAY NOT BE WORN TO SURGERY. LEAVE SUITCASE IN CAR. CAN BE BROUGHT TO ROOM AFTER SURGERY.  PATIENTS DISCHARGED THE DAY OF SURGERY WILL NOT BE ALLOWED TO DRIVE HOME.  PLEASE READ OVER THE FOLLOWING INSTRUCTION SHEETS _________________________________________________________________________________                                          [facility] - PREPARING FOR SURGERY  Before surgery, you can play an important role.  Because skin is not sterile, your skin needs to be as free of germs as possible.  You can reduce the number of germs on your skin by washing with CHG (chlorahexidine gluconate) soap before surgery.  CHG is an antiseptic cleaner which kills germs and bonds with the skin to continue killing germs even after washing. Please DO NOT use if you have an allergy to CHG or antibacterial soaps.  If your skin becomes reddened/irritated stop using the CHG and inform your nurse when you arrive at Short Stay. Do not shave (including legs and underarms) for at least 48 hours prior to the first CHG shower.  You may shave your  face. Please follow these instructions carefully:   1.  Shower with CHG Soap the night before surgery and the  morning of Surgery.   2.  If you choose to wash your hair, wash your hair first as usual with your  normal  Shampoo.   3.  After you shampoo, rinse your hair and body thoroughly to remove the  shampoo.                                         4.  Use CHG as you would any other liquid soap.  You can apply chg directly  to the skin and wash . Gently wash with scrungie or clean wascloth    5.  Apply the CHG Soap to your body ONLY FROM THE NECK DOWN.   Do not use on open                           Wound or open sores. Avoid contact with eyes, ears mouth and genitals (private parts).                        Genitals (private  parts) with your normal soap.              6.  Wash thoroughly, paying special attention to the area where your surgery  will be performed.   7.  Thoroughly rinse your body with warm water from the neck down.   8.  DO NOT shower/wash with your normal soap after using and rinsing off  the CHG Soap .                9.  Pat yourself dry with a clean towel.             10.  Wear clean night clothes to bed after shower             11.  Place clean sheets on your bed the night of your first shower and do not  sleep with pets.  Day of Surgery : Do not apply any lotions/deodorants the morning of surgery.  Please wear clean clothes to the hospital/surgery center.  FAILURE TO FOLLOW THESE INSTRUCTIONS MAY RESULT IN THE CANCELLATION OF YOUR SURGERY    PATIENT SIGNATURE_________________________________  ______________________________________________________________________

## 2014-12-03 ENCOUNTER — Encounter (HOSPITAL_COMMUNITY): Payer: Self-pay

## 2014-12-03 ENCOUNTER — Encounter (HOSPITAL_COMMUNITY)
Admission: RE | Admit: 2014-12-03 | Discharge: 2014-12-03 | Disposition: A | Discharge status: Home / Self Care | Payer: Commercial Managed Care - HMO | Source: Ambulatory Visit | Attending: General Surgery | Admitting: General Surgery

## 2014-12-03 DIAGNOSIS — Z01818 Encounter for other preprocedural examination: Secondary | ICD-10-CM | POA: Insufficient documentation

## 2014-12-03 DIAGNOSIS — C189 Malignant neoplasm of colon, unspecified: Secondary | ICD-10-CM | POA: Diagnosis not present

## 2014-12-03 LAB — CBC
HCT: 33.6 % — ABNORMAL LOW (ref 36.0–46.0)
HEMOGLOBIN: 10.8 g/dL — AB (ref 12.0–15.0)
MCH: 29 pg (ref 26.0–34.0)
MCHC: 32.1 g/dL (ref 30.0–36.0)
MCV: 90.1 fL (ref 78.0–100.0)
PLATELETS: 161 10*3/uL (ref 150–400)
RBC: 3.73 MIL/uL — AB (ref 3.87–5.11)
RDW: 15.2 % (ref 11.5–15.5)
WBC: 6.1 10*3/uL (ref 4.0–10.5)

## 2014-12-03 LAB — BASIC METABOLIC PANEL
ANION GAP: 6 (ref 5–15)
BUN: 24 mg/dL — ABNORMAL HIGH (ref 6–20)
CHLORIDE: 108 mmol/L (ref 101–111)
CO2: 30 mmol/L (ref 22–32)
CREATININE: 0.85 mg/dL (ref 0.44–1.00)
Calcium: 9.4 mg/dL (ref 8.9–10.3)
GFR calc Af Amer: >60 mL/min (ref >60)
GFR calc non Af Amer: >60 mL/min (ref >60)
Glucose, Bld: 126 mg/dL — ABNORMAL HIGH (ref 65–99)
POTASSIUM: 4.8 mmol/L (ref 3.5–5.1)
SODIUM: 144 mmol/L (ref 135–145)

## 2014-12-07 MED ORDER — CLINDAMYCIN PHOSPHATE 900 MG/50ML IV SOLN
900.0000 mg | INTRAVENOUS | Status: DC
Start: 2014-12-07 — End: 2014-12-07

## 2014-12-07 MED ORDER — GENTAMICIN SULFATE 40 MG/ML IJ SOLN
5.0000 mg/kg | INTRAVENOUS | Status: DC
  Filled 2014-12-07: qty 7.25

## 2014-12-07 MED ORDER — GENTAMICIN SULFATE 40 MG/ML IJ SOLN
5.0000 mg/kg | INTRAMUSCULAR | Status: AC
  Administered 2014-12-08: 290 mg via INTRAVENOUS
  Filled 2014-12-07: qty 7.25

## 2014-12-07 MED ORDER — CLINDAMYCIN PHOSPHATE 900 MG/50ML IV SOLN
900.0000 mg | INTRAVENOUS | Status: AC
  Administered 2014-12-08: 900 mg via INTRAVENOUS

## 2014-12-08 ENCOUNTER — Inpatient Hospital Stay (HOSPITAL_COMMUNITY): Payer: Commercial Managed Care - HMO | Admitting: Certified Registered Nurse Anesthetist

## 2014-12-08 ENCOUNTER — Encounter: Payer: Self-pay | Admitting: Anatomic Pathology & Clinical Pathology

## 2014-12-08 ENCOUNTER — Encounter (HOSPITAL_COMMUNITY)
Admission: RE | Disposition: A | Discharge status: Home / Self Care | Payer: Self-pay | Source: Ambulatory Visit | Attending: General Surgery

## 2014-12-08 ENCOUNTER — Inpatient Hospital Stay (HOSPITAL_COMMUNITY)
Admission: RE | Admit: 2014-12-08 | Discharge: 2014-12-11 | DRG: 331 | Disposition: A | Discharge status: Home / Self Care | Payer: Commercial Managed Care - HMO | Source: Ambulatory Visit | Attending: General Surgery | Admitting: General Surgery

## 2014-12-08 ENCOUNTER — Encounter (HOSPITAL_COMMUNITY): Payer: Self-pay | Admitting: Certified Registered Nurse Anesthetist

## 2014-12-08 DIAGNOSIS — Z8249 Family history of ischemic heart disease and other diseases of the circulatory system: Secondary | ICD-10-CM

## 2014-12-08 DIAGNOSIS — Z01812 Encounter for preprocedural laboratory examination: Secondary | ICD-10-CM | POA: Diagnosis not present

## 2014-12-08 DIAGNOSIS — K449 Diaphragmatic hernia without obstruction or gangrene: Secondary | ICD-10-CM | POA: Diagnosis present

## 2014-12-08 DIAGNOSIS — K219 Gastro-esophageal reflux disease without esophagitis: Secondary | ICD-10-CM | POA: Diagnosis present

## 2014-12-08 DIAGNOSIS — Z79899 Other long term (current) drug therapy: Secondary | ICD-10-CM

## 2014-12-08 DIAGNOSIS — Z85038 Personal history of other malignant neoplasm of large intestine: Secondary | ICD-10-CM | POA: Diagnosis not present

## 2014-12-08 DIAGNOSIS — C182 Malignant neoplasm of ascending colon: Principal | ICD-10-CM | POA: Diagnosis present

## 2014-12-08 DIAGNOSIS — Z800 Family history of malignant neoplasm of digestive organs: Secondary | ICD-10-CM | POA: Diagnosis not present

## 2014-12-08 DIAGNOSIS — C189 Malignant neoplasm of colon, unspecified: Secondary | ICD-10-CM | POA: Diagnosis present

## 2014-12-08 LAB — TYPE AND SCREEN
ABO/RH(D): A POS
ANTIBODY SCREEN: NEGATIVE

## 2014-12-08 SURGERY — COLECTOMY, PARTIAL, ROBOT-ASSISTED, LAPAROSCOPIC
Anesthesia: General | Site: Abdomen

## 2014-12-08 MED ORDER — ONDANSETRON HCL 4 MG/2ML IJ SOLN: 4.0000 mg | Freq: Four times a day (QID) | INTRAMUSCULAR | Status: DC | PRN

## 2014-12-08 MED ORDER — PROPOFOL 10 MG/ML IV BOLUS
INTRAVENOUS | Status: DC | PRN
  Administered 2014-12-08: 120 mg via INTRAVENOUS

## 2014-12-08 MED ORDER — ALVIMOPAN 12 MG PO CAPS
12.0000 mg | ORAL_CAPSULE | Freq: Two times a day (BID) | ORAL | Status: DC
  Administered 2014-12-09 – 2014-12-11 (×5): 12 mg via ORAL
  Filled 2014-12-08 (×6): qty 1

## 2014-12-08 MED ORDER — HYDROMORPHONE HCL 1 MG/ML IJ SOLN
0.2500 mg | INTRAMUSCULAR | Status: DC | PRN
  Administered 2014-12-08: 0.5 mg via INTRAVENOUS

## 2014-12-08 MED ORDER — CLINDAMYCIN PHOSPHATE 900 MG/50ML IV SOLN
900.0000 mg | Freq: Three times a day (TID) | INTRAVENOUS | Status: AC
  Administered 2014-12-08: 900 mg via INTRAVENOUS
  Filled 2014-12-08: qty 50

## 2014-12-08 MED ORDER — GLYCOPYRROLATE 0.2 MG/ML IJ SOLN
INTRAMUSCULAR | Status: DC | PRN
  Administered 2014-12-08: 0.4 mg via INTRAVENOUS

## 2014-12-08 MED ORDER — MORPHINE SULFATE (PF) 2 MG/ML IV SOLN
2.0000 mg | INTRAVENOUS | Status: DC | PRN
  Administered 2014-12-08 – 2014-12-09 (×3): 2 mg via INTRAVENOUS
  Filled 2014-12-08 (×3): qty 1

## 2014-12-08 MED ORDER — LORATADINE 10 MG PO TABS
10.0000 mg | ORAL_TABLET | Freq: Every day | ORAL | Status: DC
  Administered 2014-12-09 – 2014-12-11 (×3): 10 mg via ORAL
  Filled 2014-12-08 (×3): qty 1

## 2014-12-08 MED ORDER — ROCURONIUM BROMIDE 100 MG/10ML IV SOLN
INTRAVENOUS | Status: DC | PRN
  Administered 2014-12-08: 10 mg via INTRAVENOUS
  Administered 2014-12-08: 20 mg via INTRAVENOUS
  Administered 2014-12-08: 30 mg via INTRAVENOUS

## 2014-12-08 MED ORDER — ALVIMOPAN 12 MG PO CAPS
12.0000 mg | ORAL_CAPSULE | Freq: Once | ORAL | Status: AC
  Administered 2014-12-08: 12 mg via ORAL
  Filled 2014-12-08: qty 1

## 2014-12-08 MED ORDER — HYDROMORPHONE HCL 1 MG/ML IJ SOLN
INTRAMUSCULAR | Status: AC
  Filled 2014-12-08: qty 1

## 2014-12-08 MED ORDER — MENTHOL 3 MG MT LOZG
1.0000 | LOZENGE | OROMUCOSAL | Status: DC | PRN
  Filled 2014-12-08: qty 9

## 2014-12-08 MED ORDER — LACTATED RINGERS IV SOLN
INTRAVENOUS | Status: DC | PRN
  Administered 2014-12-08: 09:00:00 via INTRAVENOUS

## 2014-12-08 MED ORDER — BUPIVACAINE-EPINEPHRINE (PF) 0.25% -1:200000 IJ SOLN
INTRAMUSCULAR | Status: AC
  Filled 2014-12-08: qty 30

## 2014-12-08 MED ORDER — KCL IN DEXTROSE-NACL 30-5-0.45 MEQ/L-%-% IV SOLN
INTRAVENOUS | Status: DC
  Administered 2014-12-08 – 2014-12-09 (×2): via INTRAVENOUS
  Administered 2014-12-10: 1000 mL via INTRAVENOUS
  Filled 2014-12-08 (×3): qty 1000

## 2014-12-08 MED ORDER — LACTATED RINGERS IR SOLN
Status: DC | PRN
  Administered 2014-12-08: 1000 mL

## 2014-12-08 MED ORDER — OXYCODONE HCL 5 MG/5ML PO SOLN: 5.0000 mg | Freq: Once | ORAL | Status: DC | PRN

## 2014-12-08 MED ORDER — EPHEDRINE SULFATE 50 MG/ML IJ SOLN
INTRAMUSCULAR | Status: DC | PRN
  Administered 2014-12-08: 5 mg via INTRAVENOUS
  Administered 2014-12-08 (×2): 10 mg via INTRAVENOUS

## 2014-12-08 MED ORDER — LACTATED RINGERS IV SOLN
INTRAVENOUS | Status: DC
  Administered 2014-12-08 (×2): via INTRAVENOUS
  Administered 2014-12-08: 1000 mL via INTRAVENOUS

## 2014-12-08 MED ORDER — ONDANSETRON HCL 4 MG PO TABS: 4.0000 mg | ORAL_TABLET | Freq: Four times a day (QID) | ORAL | Status: DC | PRN

## 2014-12-08 MED ORDER — PHENOL 1.4 % MT LIQD
1.0000 | OROMUCOSAL | Status: DC | PRN
  Filled 2014-12-08: qty 177

## 2014-12-08 MED ORDER — DIPHENHYDRAMINE HCL 50 MG/ML IJ SOLN: 12.5000 mg | Freq: Four times a day (QID) | INTRAMUSCULAR | Status: DC | PRN

## 2014-12-08 MED ORDER — ENOXAPARIN SODIUM 40 MG/0.4ML ~~LOC~~ SOLN
40.0000 mg | SUBCUTANEOUS | Status: DC
  Administered 2014-12-09 – 2014-12-11 (×3): 40 mg via SUBCUTANEOUS
  Filled 2014-12-08 (×4): qty 0.4

## 2014-12-08 MED ORDER — CLINDAMYCIN PHOSPHATE 900 MG/50ML IV SOLN
INTRAVENOUS | Status: AC
  Filled 2014-12-08: qty 50

## 2014-12-08 MED ORDER — DIPHENHYDRAMINE HCL 12.5 MG/5ML PO ELIX: 12.5000 mg | ORAL_SOLUTION | Freq: Four times a day (QID) | ORAL | Status: DC | PRN

## 2014-12-08 MED ORDER — FENTANYL CITRATE (PF) 100 MCG/2ML IJ SOLN
INTRAMUSCULAR | Status: DC | PRN
  Administered 2014-12-08 (×4): 50 ug via INTRAVENOUS
  Administered 2014-12-08: 100 ug via INTRAVENOUS
  Administered 2014-12-08: 50 ug via INTRAVENOUS

## 2014-12-08 MED ORDER — ONDANSETRON HCL 4 MG/2ML IJ SOLN
INTRAMUSCULAR | Status: DC | PRN
Start: 2014-12-08 — End: 2014-12-08
  Administered 2014-12-08: 4 mg via INTRAVENOUS

## 2014-12-08 MED ORDER — 0.9 % SODIUM CHLORIDE (POUR BTL) OPTIME
TOPICAL | Status: DC | PRN
  Administered 2014-12-08: 3000 mL

## 2014-12-08 MED ORDER — BUPIVACAINE-EPINEPHRINE 0.25% -1:200000 IJ SOLN
INTRAMUSCULAR | Status: DC | PRN
  Administered 2014-12-08: 30 mL

## 2014-12-08 MED ORDER — HYDROCODONE-ACETAMINOPHEN 5-325 MG PO TABS
1.0000 | ORAL_TABLET | ORAL | Status: DC | PRN
  Administered 2014-12-09 – 2014-12-11 (×5): 1 via ORAL
  Filled 2014-12-08 (×5): qty 1

## 2014-12-08 MED ORDER — ACETAMINOPHEN 500 MG PO TABS
1000.0000 mg | ORAL_TABLET | Freq: Four times a day (QID) | ORAL | Status: AC
  Filled 2014-12-08: qty 2

## 2014-12-08 MED ORDER — OXYCODONE HCL 5 MG PO TABS: 5.0000 mg | ORAL_TABLET | Freq: Once | ORAL | Status: DC | PRN

## 2014-12-08 MED ORDER — NEOSTIGMINE METHYLSULFATE 10 MG/10ML IV SOLN
INTRAVENOUS | Status: DC | PRN
  Administered 2014-12-08: 3 mg via INTRAVENOUS

## 2014-12-08 MED ORDER — LIDOCAINE HCL (CARDIAC) 20 MG/ML IV SOLN
INTRAVENOUS | Status: DC | PRN
  Administered 2014-12-08: 50 mg via INTRAVENOUS

## 2014-12-08 SURGICAL SUPPLY — 92 items
BLADE EXTENDED COATED 6.5IN (ELECTRODE) IMPLANT
CANNULA REDUC XI 12-8 STAPL (CANNULA) ×1
CANNULA REDUC XI 12-8MM STAPL (CANNULA) ×1
CANNULA REDUCER 12-8 DVNC XI (CANNULA) ×1 IMPLANT
CELLS DAT CNTRL 66122 CELL SVR (MISCELLANEOUS) ×1 IMPLANT
CLIP LIGATING HEM O LOK PURPLE (MISCELLANEOUS) IMPLANT
CLIP LIGATING HEMOLOK MED (MISCELLANEOUS) IMPLANT
COUNTER NEEDLE 20 DBL MAG RED (NEEDLE) ×3 IMPLANT
COVER MAYO STAND STRL (DRAPES) ×6 IMPLANT
COVER SURGICAL LIGHT HANDLE (MISCELLANEOUS) ×1 IMPLANT
COVER TIP SHEARS 8 DVNC (MISCELLANEOUS) ×1 IMPLANT
COVER TIP SHEARS 8MM DA VINCI (MISCELLANEOUS) ×2
DECANTER SPIKE VIAL GLASS SM (MISCELLANEOUS) ×3 IMPLANT
DEVICE TROCAR PUNCTURE CLOSURE (ENDOMECHANICALS) ×2 IMPLANT
DRAPE ARM DVNC X/XI (DISPOSABLE) ×4 IMPLANT
DRAPE COLUMN DVNC XI (DISPOSABLE) ×1 IMPLANT
DRAPE DA VINCI XI ARM (DISPOSABLE) ×8
DRAPE DA VINCI XI COLUMN (DISPOSABLE) ×2
DRAPE SURG IRRIG POUCH 19X23 (DRAPES) ×3 IMPLANT
DRSG OPSITE POSTOP 4X10 (GAUZE/BANDAGES/DRESSINGS) IMPLANT
DRSG OPSITE POSTOP 4X6 (GAUZE/BANDAGES/DRESSINGS) ×2 IMPLANT
DRSG OPSITE POSTOP 4X8 (GAUZE/BANDAGES/DRESSINGS) IMPLANT
ELECT PENCIL ROCKER SW 15FT (MISCELLANEOUS) ×6 IMPLANT
ELECT REM PT RETURN 15FT ADLT (MISCELLANEOUS) ×3 IMPLANT
ENDOLOOP SUT PDS II  0 18 (SUTURE)
ENDOLOOP SUT PDS II 0 18 (SUTURE) IMPLANT
EVACUATOR SILICONE 100CC (DRAIN) IMPLANT
GAUZE SPONGE 4X4 12PLY STRL (GAUZE/BANDAGES/DRESSINGS) IMPLANT
GLOVE BIO SURGEON STRL SZ 6.5 (GLOVE) ×6 IMPLANT
GLOVE BIO SURGEONS STRL SZ 6.5 (GLOVE) ×3
GLOVE BIOGEL PI IND STRL 7.0 (GLOVE) ×3 IMPLANT
GLOVE BIOGEL PI INDICATOR 7.0 (GLOVE) ×16
GOWN STRL REUS W/TWL 2XL LVL3 (GOWN DISPOSABLE) ×11 IMPLANT
GOWN STRL REUS W/TWL XL LVL3 (GOWN DISPOSABLE) ×12 IMPLANT
HOLDER FOLEY CATH W/STRAP (MISCELLANEOUS) ×3 IMPLANT
LEGGING LITHOTOMY PAIR STRL (DRAPES) ×3 IMPLANT
LIQUID BAND (GAUZE/BANDAGES/DRESSINGS) ×2 IMPLANT
NDL INSUFFLATION 14GA 120MM (NEEDLE) ×1 IMPLANT
NEEDLE INSUFFLATION 14GA 120MM (NEEDLE) ×3 IMPLANT
PACK CARDIOVASCULAR III (CUSTOM PROCEDURE TRAY) ×3 IMPLANT
PACK COLON (CUSTOM PROCEDURE TRAY) ×3 IMPLANT
PEN SKIN MARKING BROAD (MISCELLANEOUS) ×3 IMPLANT
PORT LAP GEL ALEXIS MED 5-9CM (MISCELLANEOUS) ×2 IMPLANT
RELOAD STAPLE 45 BLU REG DVNC (STAPLE) IMPLANT
RELOAD STAPLE 45 GRN THCK DVNC (STAPLE) IMPLANT
RETRACTOR WND ALEXIS 18 MED (MISCELLANEOUS) IMPLANT
RTRCTR WOUND ALEXIS 18CM MED (MISCELLANEOUS) ×3
SCISSORS LAP 5X35 DISP (ENDOMECHANICALS) ×3 IMPLANT
SEAL CANN UNIV 5-8 DVNC XI (MISCELLANEOUS) ×3 IMPLANT
SEAL XI 5MM-8MM UNIVERSAL (MISCELLANEOUS) ×6
SEALER VESSEL DA VINCI XI (MISCELLANEOUS) ×2
SEALER VESSEL EXT DVNC XI (MISCELLANEOUS) ×1 IMPLANT
SET IRRIG TUBING LAPAROSCOPIC (IRRIGATION / IRRIGATOR) ×3 IMPLANT
SLEEVE XCEL OPT CAN 5 100 (ENDOMECHANICALS) IMPLANT
SOLUTION ELECTROLUBE (MISCELLANEOUS) ×3 IMPLANT
STAPLER 45 BLU RELOAD XI (STAPLE) ×4 IMPLANT
STAPLER 45 BLUE RELOAD XI (STAPLE) ×8
STAPLER 45 GREEN RELOAD XI (STAPLE)
STAPLER 45 GRN RELOAD XI (STAPLE) IMPLANT
STAPLER CANNULA SEAL DVNC XI (STAPLE) ×1 IMPLANT
STAPLER CANNULA SEAL XI (STAPLE) ×2
STAPLER SHEATH (SHEATH) ×2
STAPLER SHEATH ENDOWRIST DVNC (SHEATH) ×1 IMPLANT
STAPLER VISISTAT 35W (STAPLE) ×3 IMPLANT
SUT PDS AB 1 CTX 36 (SUTURE) IMPLANT
SUT PDS AB 1 TP1 96 (SUTURE) IMPLANT
SUT PROLENE 2 0 KS (SUTURE) ×3 IMPLANT
SUT SILK 2 0 (SUTURE) ×3
SUT SILK 2 0 SH CR/8 (SUTURE) ×3 IMPLANT
SUT SILK 2-0 18XBRD TIE 12 (SUTURE) ×1 IMPLANT
SUT SILK 3 0 (SUTURE) ×3
SUT SILK 3 0 SH CR/8 (SUTURE) ×3 IMPLANT
SUT SILK 3-0 18XBRD TIE 12 (SUTURE) ×1 IMPLANT
SUT V-LOC BARB 180 2/0GR6 GS22 (SUTURE) ×9
SUT VIC AB 2-0 SH 18 (SUTURE) ×3 IMPLANT
SUT VIC AB 2-0 SH 27 (SUTURE) ×3
SUT VIC AB 2-0 SH 27X BRD (SUTURE) ×1 IMPLANT
SUT VIC AB 3-0 SH 18 (SUTURE) IMPLANT
SUT VIC AB 4-0 PS2 27 (SUTURE) ×6 IMPLANT
SUT VICRYL 0 UR6 27IN ABS (SUTURE) ×2 IMPLANT
SUTURE V-LC BRB 180 2/0GR6GS22 (SUTURE) IMPLANT
SYRINGE 10CC LL (SYRINGE) ×3 IMPLANT
SYS LAPSCP GELPORT 120MM (MISCELLANEOUS)
SYSTEM LAPSCP GELPORT 120MM (MISCELLANEOUS) IMPLANT
TOWEL OR 17X26 10 PK STRL BLUE (TOWEL DISPOSABLE) ×3 IMPLANT
TOWEL OR NON WOVEN STRL DISP B (DISPOSABLE) ×3 IMPLANT
TRAY FOLEY W/METER SILVER 14FR (SET/KITS/TRAYS/PACK) ×3 IMPLANT
TRAY FOLEY W/METER SILVER 16FR (SET/KITS/TRAYS/PACK) ×1 IMPLANT
TROCAR BLADELESS OPT 5 100 (ENDOMECHANICALS) ×3 IMPLANT
TUBING CONNECTING 10 (TUBING) IMPLANT
TUBING CONNECTING 10' (TUBING)
TUBING FILTER THERMOFLATOR (ELECTROSURGICAL) ×3 IMPLANT

## 2014-12-08 NOTE — H&P (Signed)
The patient is a 81 year old female who presented with a sigmoid colonic mass.  She was sent to Dr. Carlean Purl for evaluation due to changes in her bowel habits and several guaiac positive stools. Dr. Carlean Purl performed a colonoscopy which showed a mass in her sigmoid colon that was not traversable with the scope. CT scans of the chest abdomen and pelvis show a sigmoid colon mass and some stable lung nodules. There are a couple abdominal nodules noted in the abdomen that are too small to characterize. Her CEA level is elevated at 11.7. She underwent a robotic sigmoidectomy without issues.  Unfortunately, her repeat colonoscopy showed a mass in her R colon.  We are here for resection of this.     Back Pain  Allergies Clarise Cruz [place], LPN; 07/14/3760 8:31 AM) Penicillins  Medication History Sharman Crate, LPN; 06/13/7614 0:73 AM) Claritin (10MG  Capsule, Oral) Active. Boniva (2.5MG  Tablet, Oral) Active. (pt unsure of dose) Medications Reconciled  Social History Sharman Crate, LPN; 08/12/624 9:48 AM) No alcohol use No caffeine use No drug use Tobacco use Never smoker.  Family History Sharman Crate, LPN; 06/15/6268 3:50 AM) Colon Cancer Family Members In General. Hypertension Son.  Pregnancy / Birth History Sharman Crate, LPN; 0/10/3816 2:99 AM) Age at menarche 23 years. Age of menopause 96-55 Gravida 2 Maternal age 46-20 Para 2     Review of Systems  CV: No chest pain Pulmonary: No SOB Gastrointestinal Present- Bloody Stool and Difficulty Swallowing. Not Present- Abdominal Pain, Bloating, Change in Bowel Habits, Chronic diarrhea, Constipation, Excessive gas, Gets full quickly at meals, Hemorrhoids, Indigestion, Nausea, Rectal Pain and Vomiting. Endocrine Present- Hair Changes. Not Present- Cold Intolerance, Excessive Hunger, Heat Intolerance, Hot flashes and New Diabetes.   BP 123/66 mmHg  Pulse 86  Temp(Src) 97.5 F (36.4 C) (Oral)  Resp 18  Ht 5\' 3"  (1.6 m)  Wt  66.679 kg (147 lb)  BMI 26.05 kg/m2  SpO2 99%    Physical Exam  General Mental Status-Alert. General Appearance-Consistent with stated age. Hydration-Well hydrated. Voice-Normal.  Head and Neck Head-normocephalic, atraumatic with no lesions or palpable masses. Trachea-midline. Thyroid Gland Characteristics - normal size and consistency.  Eye Eyeball - Bilateral-Extraocular movements intact. Sclera/Conjunctiva - Bilateral-No scleral icterus.  Chest and Lung Exam Chest and lung exam reveals -quiet, even and easy respiratory effort with no use of accessory muscles and on auscultation, normal breath sounds, no adventitious sounds and normal vocal resonance. Inspection Chest Wall - Normal. Back - normal.  Cardiovascular Cardiovascular examination reveals -normal heart sounds, regular rate and rhythm with no murmurs and normal pedal pulses bilaterally.  Abdomen Inspection Inspection of the abdomen reveals - No Hernias. Skin - Scar - Right Lower Quadrant. Palpation/Percussion Palpation and Percussion of the abdomen reveal - Soft, Non Tender, No Rebound tenderness, No Rigidity (guarding) and No hepatosplenomegaly.  Neurologic Neurologic evaluation reveals -alert and oriented x 3 with no impairment of recent or remote memory. Mental Status-Normal.  Musculoskeletal Normal Exam - Left-Upper Extremity Strength Normal and Lower Extremity Strength Normal. Normal Exam - Right-Upper Extremity Strength Normal and Lower Extremity Strength Normal.    Assessment & Plan   MASS OF COLON (569.89  K63.5) Story: 81 year old female with a right colon cancer.  have recommended that she undergo a robotic-assisted partial colectomy. I think she would do well with this and primary anastomosis, given her very independent living status. Her metastatic workup has been been completed. There are no obvious signs of metastatic disease.  Impression: The surgery and  anatomy  were described to the patient as well as the risks of surgery and the possible complications. These include: Bleeding, deep abdominal infections and possible wound complications such as hernia and infection, damage to adjacent structures, leak of surgical connections, which can lead to other surgeries and possibly an ostomy, possible need for other procedures, such as abscess drains in radiology, possible prolonged hospital stay, possible diarrhea from removal of part of the colon, possible constipation from narcotics, possible bowel, bladder or sexual dysfunction if having rectal surgery, prolonged fatigue/weakness or appetite loss, possible early recurrence of of disease, possible complications of their medical problems such as heart disease or arrhythmias or lung problems, death (less than 1%). I believe the patient understands and wishes to proceed with the surgery.

## 2014-12-08 NOTE — Op Note (Signed)
12/08/2014  12:58 PM  PATIENT:  Heidi Marquez  81 y.o. female  Patient Care Team: Prince Solian, MD as PCP - General (Internal Medicine) Gaye Pollack, MD as Consulting Physician (Cardiothoracic Surgery) Gatha Mayer, MD as Consulting Physician (Gastroenterology) Leighton Ruff, MD as Consulting Physician (General Surgery) Tania Ade, RN as Registered Nurse (Medical Oncology)  PRE-OPERATIVE DIAGNOSIS:  colon cancer  POST-OPERATIVE DIAGNOSIS:  colon cancer  PROCEDURE:  XI ROBOTIC RIGHT COLECTOMY   Surgeon(s): Leighton Ruff, MD Stark Klein, MD  ASSISTANT: Dr Barry Dienes   ANESTHESIA:   local and general  EBL:  Total I/O In: 2250 [I.V.:2250] Out: 200 [Urine:150; Blood:50]  SPECIMEN:  Source of Specimen:  R colon  DISPOSITION OF SPECIMEN:  PATHOLOGY  COUNTS:  YES  PLAN OF CARE: Admit to inpatient   PATIENT DISPOSITION:  PACU - hemodynamically stable.   INDICATIONS: This is a 81 y.o. female who presented to my office with a right sided colonic mass found on repeat colonoscopy after an incomplete colonoscopy and resection of her sigmoid colon earlier this year. The risk and benefits and alternative treatments were explained to the patient prior to the OR and the patient has elected to proceed with robotic right colectomy.  Consent was signed and placed on chart prior to the OR.   OR FINDINGS: tumor adherent to right lateral sidewall ~6cm from the ASIS.  No signs of metastatic disease noted within the peritoneum or liver.  DESCRIPTION:  The patient was identified & brought into the operating room. The patient was positioned supine with both arms tucked. SCDs were active during the entire case. The patient underwent general anesthesia without any difficulty. A foley catheter was inserted under sterile conditions. The abdomen was prepped and draped in a sterile fashion. A Surgical Timeout confirmed our plan.  I accessed the abdomen via Varies needle in the LUQ.  We  induced carbon dioxide insufflation. An 54mm port was placed through this site.  Camera inspection revealed no injury. I placed additional ports under direct laparoscopic visualization.   The 39mm port was also placed in the LUQ.  I evaluated the entire abdomen laparoscopically.  The liver appeared normal, the large and small bowel were normal as well.  There were no signs of metastatic disease.  The omentum was placed up above the liver.  The robot was docked on the patient's left side.  I began by identifying the ileocolic artery and vein within the mesentery. Dissection was bluntly carried around these structures. The duodenum was identified and free from the structures. I then separated the structures bluntly and used the robotic vessel sealer device to transect these separately.  I developed the retroperitoneal plane bluntly.  I then freed the appendix off its attachments to the pelvic wall. I mobilized the terminal ileum.  I took care to avoid injuring any retroperitoneal structures.  After this I began to mobilize laterally down the white line of Toldt.  The tumor site was adherent to the lateral sidewall, so a took a cuff of peritoneum as well.  I then took down the hepatic flexure using the vessel sealer device. I mobilized the omentum off of the right transverse colon. The entire colon was then flipped medially and mobilized off of the retroperitoneal structures until I could visualize the lateral edge of the duodenum underneath.   The right ureter was visualized and preserved.  I gently freed the duodenal attachments.  I continue to divide the mesentery until the right colic artery  and vein were identified.  These were taken with the vessel sealer each separately.  I mobilized the mesentery to the level of the right branch of the middle colic artery.  I then transected the colon just proximal to this with a blue load robotic stapler.  I divided the terminal ileum mesentery in similar fashion and divided  the terminal ileum with a blue load robotic stapler.  I then freed the remaining attachments and placed the specimen in the RUQ for extraction later.  I brought over the remaining ileum and laid this end to side against the transverse colon.  An intentional enterotomy was made in each portion of the bowel and 2 blue load staplers were used to create an anastomosis.  The common enterotomy channel was closed with 2 2-0 V lock sutures.  The omentum was then placed over this and sutured over the closure.  At this point hemostasis was good.  THe abdomen was irrigated and the 26mm port was removed.  An endoclose device was used to laparoscopically close this site with a 0 Vicryl suture.   At that point, I enlarged my lower port at the previous Phannensteil incision and placed an alexis wound protector.  The specimen was removed, and the irrigation was removed.  The omentum was brought down over the bowel and the alexis was then removed.  The peritoneum was close with a running 2-0 Vicryl suture and the fascia was then closed with #1 PDS running sutures.  The subcutaneous tissue was closed with 2-0 Vicryl sutures and the skin of the extraction site and the port sites were all closed using 4-0 running Vicryl sutures.  Dermabond was placed over the port sites and the extraction site was covered with a sterile dressing.  All counts were correct per operating room staff. The patient was then awakened from anesthesia and sent to the post anesthesia care unit in stable condition.

## 2014-12-08 NOTE — Transfer of Care (Signed)
Immediate Anesthesia Transfer of Care Note  Patient: Heidi Marquez  Procedure(s) Performed: Procedure(s): XI ROBOT RIGHT COLECTOMY (N/A)  Patient Location: PACU  Anesthesia Type:General  Level of Consciousness: sedated, patient cooperative and responds to stimulation  Airway & Oxygen Therapy: Patient Spontanous Breathing and Patient connected to face mask oxygen  Post-op Assessment: Report given to RN and Post -op Vital signs reviewed and stable  Post vital signs: Reviewed and stable  Last Vitals:  Filed Vitals:   12/08/14 0723  BP: 123/66  Pulse: 86  Temp: 36.4 C  Resp: 18    Complications: No apparent anesthesia complications

## 2014-12-08 NOTE — Progress Notes (Signed)
Title of Study: PATHOLOGY PROCUREMENT  Description: Customer service manager for the Discovery and Validation of Biomarkers for the Prediction, Diagnosis and Management of Disease  Principal Investigator: Enid Cutter, MD  Study Coordinator: Rodena Goldmann  IRB #: [id number]  Met with Ms. Trani for 15 minutes to review IRB# [id number]. 1 family members present. The patient is eligible and qualifies for this study. Reviewed the consent/HIPAA form in detail and explained the purpose of the study, study procedures, potential risks, potential benefits, and alternatives to participation. All of the patient's questions were answered. The patient agreed to take part in the study and signed the consent/HIPAA document. Enrollment procedures completed. The patient was given a copy of the signed informed consent. Consent form loaded in Media Tab (12/08/14 Procedure Note - [email or web address].pdf)  Requesting Surgical Samples to be sent to histology FRESH, No Formalin to be used . Blood drawn: NONE  Leftover tissue obtained by PA for research purposes. Tissue Source: PENDING  (Updated 26-Sept-2016  Original 12-May-2014)  [place]  Cell [phone]

## 2014-12-08 NOTE — Anesthesia Postprocedure Evaluation (Signed)
Anesthesia Post Note  Patient: Heidi Marquez  Procedure(s) Performed: Procedure(s) (LRB): XI ROBOT RIGHT COLECTOMY (N/A)  Anesthesia type: General  Patient location: PACU  Post pain: Pain level controlled and Adequate analgesia  Post assessment: Post-op Vital signs reviewed, Patient's Cardiovascular Status Stable, Respiratory Function Stable, Patent Airway and Pain level controlled  Last Vitals:  Filed Vitals:   12/08/14 1500  BP: 158/75  Pulse: 64  Temp: 37 C  Resp: 18    Post vital signs: Reviewed and stable  Level of consciousness: awake, alert  and oriented  Complications: No apparent anesthesia complications

## 2014-12-08 NOTE — Anesthesia Procedure Notes (Signed)
Procedure Name: Intubation Performed by: Gean Maidens Pre-anesthesia Checklist: Patient identified, Emergency Drugs available, Suction available and Patient being monitored Patient Re-evaluated:Patient Re-evaluated prior to inductionOxygen Delivery Method: Circle system utilized Preoxygenation: Pre-oxygenation with 100% oxygen Intubation Type: IV induction Ventilation: Mask ventilation without difficulty Laryngoscope Size: Mac and 4 Grade View: Grade II Tube type: Oral Tube size: 7.0 mm Number of attempts: 1 Airway Equipment and Method: Stylet Placement Confirmation: ETT inserted through vocal cords under direct vision,  positive ETCO2,  CO2 detector and breath sounds checked- equal and bilateral Secured at: 21 cm Tube secured with: Tape Dental Injury: Teeth and Oropharynx as per pre-operative assessment

## 2014-12-08 NOTE — Anesthesia Preprocedure Evaluation (Signed)
Anesthesia Evaluation  Patient identified by MRN, date of birth, ID band Patient awake    Reviewed: Allergy & Precautions, H&P , NPO status , Patient's Chart, lab work & pertinent test results  Airway Mallampati: II   Neck ROM: full    Dental   Pulmonary neg pulmonary ROS,    breath sounds clear to auscultation       Cardiovascular negative cardio ROS   Rhythm:regular Rate:Normal     Neuro/Psych  Neuromuscular disease    GI/Hepatic hiatal hernia, GERD  ,  Endo/Other    Renal/GU      Musculoskeletal  (+) Arthritis ,   Abdominal   Peds  Hematology   Anesthesia Other Findings   Reproductive/Obstetrics                             Anesthesia Physical Anesthesia Plan  ASA: II  Anesthesia Plan: General   Post-op Pain Management:    Induction: Intravenous  Airway Management Planned: Oral ETT  Additional Equipment:   Intra-op Plan:   Post-operative Plan: Extubation in OR  Informed Consent: I have reviewed the patients History and Physical, chart, labs and discussed the procedure including the risks, benefits and alternatives for the proposed anesthesia with the patient or authorized representative who has indicated his/her understanding and acceptance.     Plan Discussed with: CRNA, Anesthesiologist and Surgeon  Anesthesia Plan Comments:         Anesthesia Quick Evaluation

## 2014-12-09 LAB — BASIC METABOLIC PANEL
Anion gap: 6 (ref 5–15)
BUN: 14 mg/dL (ref 6–20)
CALCIUM: 7.6 mg/dL — AB (ref 8.9–10.3)
CO2: 27 mmol/L (ref 22–32)
Chloride: 105 mmol/L (ref 101–111)
Creatinine, Ser: 0.91 mg/dL (ref 0.44–1.00)
GFR calc Af Amer: >60 mL/min (ref >60)
GFR, EST NON AFRICAN AMERICAN: 58 mL/min — AB (ref >60)
GLUCOSE: 112 mg/dL — AB (ref 65–99)
POTASSIUM: 3.5 mmol/L (ref 3.5–5.1)
SODIUM: 138 mmol/L (ref 135–145)

## 2014-12-09 LAB — CBC
HEMATOCRIT: 27.4 % — AB (ref 36.0–46.0)
Hemoglobin: 8.9 g/dL — ABNORMAL LOW (ref 12.0–15.0)
MCH: 28.7 pg (ref 26.0–34.0)
MCHC: 32.5 g/dL (ref 30.0–36.0)
MCV: 88.4 fL (ref 78.0–100.0)
PLATELETS: 151 10*3/uL (ref 150–400)
RBC: 3.1 MIL/uL — ABNORMAL LOW (ref 3.87–5.11)
RDW: 15.2 % (ref 11.5–15.5)
WBC: 8.1 10*3/uL (ref 4.0–10.5)

## 2014-12-09 NOTE — Progress Notes (Signed)
1 Day Post-Op Robotic assisted R colectomy Subjective: Pt doing well, had some pain overnight but controlled with IV meds.  No nausea, has a sore throat  Objective: Vital signs in last 24 hours: Temp:  [97.4 F (36.3 C)-98.6 F (37 C)] 98.4 F (36.9 C) (10/27 0640) Pulse Rate:  [56-84] 80 (10/27 0640) Resp:  [12-20] 20 (10/27 0640) BP: (121-166)/(56-78) 133/56 mmHg (10/27 0640) SpO2:  [93 %-100 %] 93 % (10/27 0640)   Intake/Output from previous day: 10/26 0701 - 10/27 0700 In: 3485 [I.V.:3485] Out: 1500 [Urine:1450; Blood:50] Intake/Output this shift:   General appearance: alert and cooperative GI: soft, appropriately tender  Incision: no significant drainage  Lab Results:   Recent Labs  12/09/14 0452  WBC 8.1  HGB 8.9*  HCT 27.4*  PLT 151   BMET  Recent Labs  12/09/14 0452  NA 138  K 3.5  CL 105  CO2 27  GLUCOSE 112*  BUN 14  CREATININE 0.91  CALCIUM 7.6*   PT/INR No results for input(s): LABPROT, INR in the last 72 hours. ABG No results for input(s): PHART, HCO3 in the last 72 hours.  Invalid input(s): PCO2, PO2  MEDS, Scheduled . acetaminophen  1,000 mg Oral 4 times per day  . alvimopan  12 mg Oral BID  . enoxaparin (LOVENOX) injection  40 mg Subcutaneous Q24H  . loratadine  10 mg Oral Daily    Studies/Results: No results found.  Assessment: s/p Procedure(s): XI ROBOT RIGHT COLECTOMY Patient Active Problem List   Diagnosis Date Noted  . Colon cancer ([place]) 05/17/2014  . Internal hemorrhoids with complication - Grade 3 prolapse 04/07/2014  . Rectocele 04/07/2014  . Pelvic floor dysfunction suspected  04/07/2014  . Nodule of right lung 10/11/2012  . Rectal bleeding 01/24/2012  . Orthostatic hypotension 01/24/2012  . Diverticulosis of colon with hemorrhage 01/24/2012  . ESOPHAGEAL STRICTURE 12/29/2007  . GERD 12/29/2007    Expected post op course, difficult surgical resection  Plan: Advance diet to clears Minimize IV fluids OOB  today   LOS: 1 day     .Rosario Adie, [facility], [place]   12/09/2014 7:52 AM

## 2014-12-09 NOTE — Evaluation (Signed)
Physical Therapy Evaluation Patient Details Name: Heidi Marquez MRN: [id number] DOB: Oct 21, 1933 Today's Date: 12/09/2014   History of Present Illness  s/p sigmoid colonic mass resection d/t colon cancer   Clinical Impression  Pt s/p sigmoid colonic mass resection surgery d/t colon cancer, resulting in functional limitations due to the deficits  listed below (see PT problem list). Pt will benefit from skilled PT to increase their independence and safety with mobility to allow discharge. Pt is doing well ambulating, but needs assistance with getting out of bed. Pt with plans to d/c home.     Follow Up Recommendations Supervision - Intermittent;No PT follow up    Equipment Recommendations  None recommended by PT    Recommendations for Other Services       Precautions / Restrictions Precautions Precautions: None Restrictions Weight Bearing Restrictions: No      Mobility  Bed Mobility Overal bed mobility: Needs Assistance Bed Mobility: Supine to Sit     Supine to sit: Min assist     General bed mobility comments: 1 person hand help assist to lift up d/t pain   Transfers Overall transfer level: Needs assistance Equipment used: 2 person hand held assist             General transfer comment: Pt had to pull up with both UEs to stand up due to bilateral knee pain secondary to arthritis   Ambulation/Gait Ambulation/Gait assistance: Min assist;+2 safety/equipment Ambulation Distance (Feet): 120 Feet Assistive device: 2 person hand held assist Gait Pattern/deviations: Step-through pattern;Decreased stride length;Decreased dorsiflexion - right;Decreased dorsiflexion - left Gait velocity: decreased    General Gait Details: Pt had to take short steps and hold on with both hands for stability, needs RW for safe ambulation at this point   Stairs            Wheelchair Mobility    Modified Rankin (Stroke Patients Only)       Balance Overall balance assessment:  Needs assistance (Simultaneous filing. User may not have seen previous data.)           Standing balance-Leahy Scale: Poor                               Pertinent Vitals/Pain Pain Assessment: Faces Faces Pain Scale: Hurts whole lot Pain Location: abdomen Pain Descriptors / Indicators: Sore;Grimacing;Guarding Pain Intervention(s): Limited activity within patient's tolerance;Monitored during session;Patient requesting pain meds-RN notified    Home Living                        Prior Function                 Hand Dominance        Extremity/Trunk Assessment   Upper Extremity Assessment: Overall WFL for tasks assessed           Lower Extremity Assessment: Overall WFL for tasks assessed      Cervical / Trunk Assessment: Normal  Communication      Cognition Arousal/Alertness: Awake/alert Behavior During Therapy: WFL for tasks assessed/performed Overall Cognitive Status: Within Functional Limits for tasks assessed                      General Comments      Exercises        Assessment/Plan    PT Assessment Patient needs continued PT services  PT Diagnosis Difficulty walking;Acute pain  PT Problem List Decreased activity tolerance;Decreased mobility;Pain;Decreased knowledge of use of DME;Decreased range of motion  PT Treatment Interventions DME instruction;Gait training;Stair training;Functional mobility training;Therapeutic activities;Therapeutic exercise;Patient/family education   PT Goals (Current goals can be found in the Care Plan section) Acute Rehab PT Goals Patient Stated Goal: return home, walk without pain PT Goal Formulation: With patient Time For Goal Achievement: 12/23/14 Potential to Achieve Goals: Good    Frequency Min 3X/week   Barriers to discharge        Co-evaluation               End of Session Equipment Utilized During Treatment: Gait belt Activity Tolerance: Patient tolerated treatment  well;Patient limited by pain Patient left: in chair;with call bell/phone within reach;with bed alarm set Nurse Communication: Mobility status;Patient requests pain meds         Time: 1143-1206 PT Time Calculation (min) (ACUTE ONLY): 23 min   Charges:   PT Evaluation $Initial PT Evaluation Tier I: 1 Procedure PT Treatments $Gait Training: 8-22 mins   PT G Codes:        Conna Terada, SPT 12/22/2014, 4:05 PM

## 2014-12-09 NOTE — Care Management Note (Signed)
Case Management Note  Patient Details  Name: Heidi Marquez MRN: [id number] Date of Birth: 1933-04-29  Subjective/Objective:         S/p robotic colectomy           Action/Plan: Discharge planning  Expected Discharge Date:                  Expected Discharge Plan:  Home/Self Care  In-House Referral:  NA  Discharge planning Services  CM Consult  Post Acute Care Choice:  NA Choice offered to:  NA  DME Arranged:  N/A DME Agency:  NA  HH Arranged:  NA HH Agency:  NA  Status of Service:  Completed, signed off  Medicare Important Message Given:    Date Medicare IM Given:    Medicare IM give by:    Date Additional Medicare IM Given:    Additional Medicare Important Message give by:     If discussed at [place] of Stay Meetings, dates discussed:    Additional Comments:  Guadalupe Maple, RN 12/09/2014, 8:51 AM

## 2014-12-10 LAB — BASIC METABOLIC PANEL
Anion gap: 6 (ref 5–15)
BUN: 7 mg/dL (ref 6–20)
CALCIUM: 8.3 mg/dL — AB (ref 8.9–10.3)
CHLORIDE: 105 mmol/L (ref 101–111)
CO2: 28 mmol/L (ref 22–32)
CREATININE: 0.72 mg/dL (ref 0.44–1.00)
GFR calc non Af Amer: >60 mL/min (ref >60)
GLUCOSE: 113 mg/dL — AB (ref 65–99)
Potassium: 3.8 mmol/L (ref 3.5–5.1)
Sodium: 139 mmol/L (ref 135–145)

## 2014-12-10 LAB — CBC
HCT: 30.6 % — ABNORMAL LOW (ref 36.0–46.0)
Hemoglobin: 9.9 g/dL — ABNORMAL LOW (ref 12.0–15.0)
MCH: 29.3 pg (ref 26.0–34.0)
MCHC: 32.4 g/dL (ref 30.0–36.0)
MCV: 90.5 fL (ref 78.0–100.0)
PLATELETS: 168 10*3/uL (ref 150–400)
RBC: 3.38 MIL/uL — AB (ref 3.87–5.11)
RDW: 15.1 % (ref 11.5–15.5)
WBC: 7.6 10*3/uL (ref 4.0–10.5)

## 2014-12-10 NOTE — Progress Notes (Signed)
2 Days Post-Op Robotic assisted R colectomy Subjective: Pt doing well, pain better.  No nausea, having flatus  Objective: Vital signs in last 24 hours: Temp:  [98 F (36.7 C)-99.6 F (37.6 C)] 98.4 F (36.9 C) (10/28 0626) Pulse Rate:  [82-89] 87 (10/28 0626) Resp:  [16-18] 18 (10/28 0626) BP: (127-148)/(54-74) 148/74 mmHg (10/28 0626) SpO2:  [92 %-93 %] 93 % (10/28 0626)   Intake/Output from previous day: 10/27 0701 - 10/28 0700 In: 2224.8 [P.O.:1200; I.V.:1024.8] Out: 2475 [Urine:2475] Intake/Output this shift:   General appearance: alert and cooperative GI: soft, appropriately tender  Incision: no significant drainage  Lab Results:   Recent Labs  12/09/14 0452 12/10/14 0537  WBC 8.1 7.6  HGB 8.9* 9.9*  HCT 27.4* 30.6*  PLT 151 168   BMET  Recent Labs  12/09/14 0452 12/10/14 0537  NA 138 139  K 3.5 3.8  CL 105 105  CO2 27 28  GLUCOSE 112* 113*  BUN 14 7  CREATININE 0.91 0.72  CALCIUM 7.6* 8.3*   PT/INR No results for input(s): LABPROT, INR in the last 72 hours. ABG No results for input(s): PHART, HCO3 in the last 72 hours.  Invalid input(s): PCO2, PO2  MEDS, Scheduled . alvimopan  12 mg Oral BID  . enoxaparin (LOVENOX) injection  40 mg Subcutaneous Q24H  . loratadine  10 mg Oral Daily    Studies/Results: No results found.  Assessment: s/p Procedure(s): XI ROBOT RIGHT COLECTOMY Patient Active Problem List   Diagnosis Date Noted  . Colon cancer ([place]) 05/17/2014  . Internal hemorrhoids with complication - Grade 3 prolapse 04/07/2014  . Rectocele 04/07/2014  . Pelvic floor dysfunction suspected  04/07/2014  . Nodule of right lung 10/11/2012  . Rectal bleeding 01/24/2012  . Orthostatic hypotension 01/24/2012  . Diverticulosis of colon with hemorrhage 01/24/2012  . ESOPHAGEAL STRICTURE 12/29/2007  . GERD 12/29/2007    Expected post op course, difficult surgical resection  Plan: Advance diet to soft foods Minimize IV fluids OOB  today Foley is out   LOS: 2 days     .Rosario Adie, MD [facility], [place]   12/10/2014 7:45 AM

## 2014-12-10 NOTE — Care Management Important Message (Signed)
Important Message  Patient Details  Name: Heidi Marquez MRN: [id number] Date of Birth: 1933/03/18   Medicare Important Message Given:  Yes-second notification given    Shelda Altes 12/10/2014, 3:35 [place] Message  Patient Details  Name: Heidi Marquez MRN: [id number] Date of Birth: November 24, 1933   Medicare Important Message Given:  Yes-second notification given    Shelda Altes 12/10/2014, 3:34 PM

## 2014-12-10 NOTE — Discharge Instructions (Addendum)
ABDOMINAL SURGERY: POST OP INSTRUCTIONS ° °1. DIET: Follow a light bland diet the first 24 hours after arrival home, such as soup, liquids, crackers, etc.  Be sure to include lots of fluids daily.  Avoid fast food or heavy meals as your are more likely to get nauseated.  Do not eat any uncooked fruits or vegetables for the next 2 weeks as your colon heals. °2. Take your usually prescribed home medications unless otherwise directed. °3. PAIN CONTROL: °a. Pain is best controlled by a usual combination of three different methods TOGETHER: °i. Ice/Heat °ii. Over the counter pain medication °iii. Prescription pain medication °b. Most patients will experience some swelling and bruising around the incisions.  Ice packs or heating pads (30-60 minutes up to 6 times a day) will help. Use ice for the first few days to help decrease swelling and bruising, then switch to heat to help relax tight/sore spots and speed recovery.  Some people prefer to use ice alone, heat alone, alternating between ice & heat.  Experiment to what works for you.  Swelling and bruising can take several weeks to resolve.   °c. It is helpful to take an over-the-counter pain medication regularly for the first few weeks.  Choose one of the following that works best for you: °i. Naproxen (Aleve, etc)  Two 220mg tabs twice a day °ii. Ibuprofen (Advil, etc) Three 200mg tabs four times a day (every meal & bedtime) °iii. Acetaminophen (Tylenol, etc) 500-650mg four times a day (every meal & bedtime) °d. A  prescription for pain medication (such as oxycodone, hydrocodone, etc) should be given to you upon discharge.  Take your pain medication as prescribed.  °i. If you are having problems/concerns with the prescription medicine (does not control pain, nausea, vomiting, rash, itching, etc), please call us [phone] to see if we need to switch you to a different pain medicine that will work better for you and/or control your side effect better. °ii. If you  need a refill on your pain medication, please contact your pharmacy.  They will contact our office to request authorization. Prescriptions will not be filled after 5 pm or on week-ends. °4. Avoid getting constipated.  Between the surgery and the pain medications, it is common to experience some constipation.  Increasing fluid intake and taking a fiber supplement (such as Metamucil, Citrucel, FiberCon, MiraLax, etc) 1-2 times a day regularly will usually help prevent this problem from occurring.  A mild laxative (prune juice, Milk of Magnesia, MiraLax, etc) should be taken according to package directions if there are no bowel movements after 48 hours.   °5. Watch out for diarrhea.  If you have many loose bowel movements, simplify your diet to bland foods & liquids for a few days.  Stop any stool softeners and decrease your fiber supplement.  Switching to mild anti-diarrheal medications (Kayopectate, Pepto Bismol) can help.  If this worsens or does not improve, please call us. °6. Wash / shower every day.  You may shower over the incision / wound.  Avoid baths until the skin is fully healed.  Continue to shower over incision(s) after the dressing is off. °7. Remove your waterproof bandages 5 days after surgery.  You may leave the incision open to air.  You may replace a dressing/Band-Aid to cover the incision for comfort if you wish. °8. ACTIVITIES as tolerated:   °a. You may resume regular (light) daily activities beginning the next day--such as daily self-care, walking, climbing stairs--gradually increasing activities as   tolerated.  If you can walk 30 minutes without difficulty, it is safe to try more intense activity such as jogging, treadmill, bicycling, low-impact aerobics, swimming, etc. b. Save the most intensive and strenuous activity for last such as sit-ups, heavy lifting, contact sports, etc  Refrain from any heavy lifting or straining until you are off narcotics for pain control.   c. DO NOT PUSH THROUGH  PAIN.  Let pain be your guide: If it hurts to do something, don't do it.  Pain is your body warning you to avoid that activity for another week until the pain goes down. d. You may drive when you are no longer taking prescription pain medication, you can comfortably wear a seatbelt, and you can safely maneuver your car and apply brakes. e. Dennis Bast may have sexual intercourse when it is comfortable.  9. FOLLOW UP in our office a. Please call [facility] at [phone] to set up an appointment to see your surgeon in the office for a follow-up appointment approximately 1-2 weeks after your surgery. b. Make sure that you call for this appointment the day you arrive home to insure a convenient appointment time. 10. IF YOU HAVE DISABILITY OR FAMILY LEAVE FORMS, BRING THEM TO THE OFFICE FOR PROCESSING.  DO NOT GIVE THEM TO YOUR DOCTOR.   WHEN TO CALL us [phone]: 1. Poor pain control 2. Reactions / problems with new medications (rash/itching, nausea, etc)  3. Fever over 101.5 F (38.5 C) 4. Inability to urinate 5. Nausea and/or vomiting 6. Worsening swelling or bruising 7. Continued bleeding from incision. 8. Increased pain, redness, or drainage from the incision  The clinic staff is available to answer your questions during regular business hours (8:30am-5pm).  Please dont hesitate to call and ask to speak to one of our nurses for clinical concerns.   A surgeon from [facility] is always on call at the hospitals   If you have a medical emergency, go to the nearest emergency room or call 911.   Leighton Ruff, MD  [facility], [address], [place], [place], [place]  [address] ? MAIN: [phone] ? TOLL FREE: [phone] ? FAX [phone] [email or web address]

## 2014-12-11 LAB — CBC
HEMATOCRIT: 30 % — AB (ref 36.0–46.0)
Hemoglobin: 9.8 g/dL — ABNORMAL LOW (ref 12.0–15.0)
MCH: 29 pg (ref 26.0–34.0)
MCHC: 32.7 g/dL (ref 30.0–36.0)
MCV: 88.8 fL (ref 78.0–100.0)
PLATELETS: 162 10*3/uL (ref 150–400)
RBC: 3.38 MIL/uL — ABNORMAL LOW (ref 3.87–5.11)
RDW: 14.7 % (ref 11.5–15.5)
WBC: 7.2 10*3/uL (ref 4.0–10.5)

## 2014-12-11 LAB — BASIC METABOLIC PANEL
Anion gap: 5 (ref 5–15)
BUN: 12 mg/dL (ref 6–20)
CALCIUM: 8.4 mg/dL — AB (ref 8.9–10.3)
CO2: 29 mmol/L (ref 22–32)
CREATININE: 0.85 mg/dL (ref 0.44–1.00)
Chloride: 106 mmol/L (ref 101–111)
GFR calc Af Amer: >60 mL/min (ref >60)
GLUCOSE: 99 mg/dL (ref 65–99)
Potassium: 4.3 mmol/L (ref 3.5–5.1)
SODIUM: 140 mmol/L (ref 135–145)

## 2014-12-11 MED ORDER — HYDROCODONE-ACETAMINOPHEN 5-325 MG PO TABS: 1.0000 | ORAL_TABLET | Freq: Four times a day (QID) | ORAL | Status: DC | PRN

## 2014-12-11 NOTE — Progress Notes (Signed)
12/11/14 0945  Reviewed discharge instructions with patient. Patient verbalized understanding of discharge instructions. Copy of discharge instructions and prescriptions were given to patient.

## 2014-12-11 NOTE — Discharge Summary (Addendum)
Physician Discharge Summary  Patient ID:  Heidi Marquez  MRN: [id number]  DOB/AGE: [id number] y.o.  Admit date: 12/08/2014 Discharge date: 12/11/2014  Discharge Diagnoses:  1.  Right colon ca  3.8 cm invasive adeno, 0/19 nodes, one tumor deposit seen - T4A, N1c  2.  Sigmoid colectomy - 05/17/2014  For sigmoid colon cancer 3.  Anemia   Active Problems:   Colon cancer ([place])  Operation: Procedure(s): XI ROBOT RIGHT COLECTOMY on 12/08/2014 - A. Marcello Moores  Discharged Condition: good  Hospital Course: Heidi Marquez is an 81 y.o. female whose primary care physician is Tivis Ringer, MD and who was admitted 12/08/2014 with a chief complaint of No chief complaint on file. Marland Kitchen   She was brought to the operating room on 12/08/2014 and underwent  ROBOT RIGHT COLECTOMY.  She is now 3 days post op.  She is eating without difficulty.  She has passed flatus and had small BM. She is ready to go home.   The discharge instructions were reviewed with the patient.  Consults: None  Significant Diagnostic Studies: Results for orders placed or performed during the hospital encounter of 33/82/50  Basic metabolic panel  Result Value Ref Range   Sodium 138 135 - 145 mmol/L   Potassium 3.5 3.5 - 5.1 mmol/L   Chloride 105 101 - 111 mmol/L   CO2 27 22 - 32 mmol/L   Glucose, Bld 112 (H) 65 - 99 mg/dL   BUN 14 6 - 20 mg/dL   Creatinine, Ser 0.91 0.44 - 1.00 mg/dL   Calcium 7.6 (L) 8.9 - 10.3 mg/dL   GFR calc non Af Amer 58 (L) >60 mL/min   GFR calc Af Amer >60 >60 mL/min   Anion gap 6 5 - 15  CBC  Result Value Ref Range   WBC 8.1 4.0 - 10.5 K/uL   RBC 3.10 (L) 3.87 - 5.11 MIL/uL   Hemoglobin 8.9 (L) 12.0 - 15.0 g/dL   HCT 27.4 (L) 36.0 - 46.0 %   MCV 88.4 78.0 - 100.0 fL   MCH 28.7 26.0 - 34.0 pg   MCHC 32.5 30.0 - 36.0 g/dL   RDW 15.2 11.5 - 15.5 %   Platelets 151 150 - 400 K/uL  Basic metabolic panel  Result Value Ref Range   Sodium 139 135 - 145 mmol/L   Potassium 3.8 3.5 -  5.1 mmol/L   Chloride 105 101 - 111 mmol/L   CO2 28 22 - 32 mmol/L   Glucose, Bld 113 (H) 65 - 99 mg/dL   BUN 7 6 - 20 mg/dL   Creatinine, Ser 0.72 0.44 - 1.00 mg/dL   Calcium 8.3 (L) 8.9 - 10.3 mg/dL   GFR calc non Af Amer >60 >60 mL/min   GFR calc Af Amer >60 >60 mL/min   Anion gap 6 5 - 15  CBC  Result Value Ref Range   WBC 7.6 4.0 - 10.5 K/uL   RBC 3.38 (L) 3.87 - 5.11 MIL/uL   Hemoglobin 9.9 (L) 12.0 - 15.0 g/dL   HCT 30.6 (L) 36.0 - 46.0 %   MCV 90.5 78.0 - 100.0 fL   MCH 29.3 26.0 - 34.0 pg   MCHC 32.4 30.0 - 36.0 g/dL   RDW 15.1 11.5 - 15.5 %   Platelets 168 150 - 400 K/uL  Basic metabolic panel  Result Value Ref Range   Sodium 140 135 - 145 mmol/L   Potassium 4.3 3.5 - 5.1 mmol/L   Chloride  106 101 - 111 mmol/L   CO2 29 22 - 32 mmol/L   Glucose, Bld 99 65 - 99 mg/dL   BUN 12 6 - 20 mg/dL   Creatinine, Ser 0.85 0.44 - 1.00 mg/dL   Calcium 8.4 (L) 8.9 - 10.3 mg/dL   GFR calc non Af Amer >60 >60 mL/min   GFR calc Af Amer >60 >60 mL/min   Anion gap 5 5 - 15  CBC  Result Value Ref Range   WBC 7.2 4.0 - 10.5 K/uL   RBC 3.38 (L) 3.87 - 5.11 MIL/uL   Hemoglobin 9.8 (L) 12.0 - 15.0 g/dL   HCT 30.0 (L) 36.0 - 46.0 %   MCV 88.8 78.0 - 100.0 fL   MCH 29.0 26.0 - 34.0 pg   MCHC 32.7 30.0 - 36.0 g/dL   RDW 14.7 11.5 - 15.5 %   Platelets 162 150 - 400 K/uL    No results found.  Discharge Exam:  Filed Vitals:   12/11/14 0628  BP: 151/72  Pulse: 76  Temp: 98.4 F (36.9 C)  Resp: 18    General: WN older WF who is alert and generally healthy appearing.  Lungs: Clear to auscultation and symmetric breath sounds. Heart:  RRR. No murmur or rub. Abdomen: Soft. No hernia. Normal bowel sounds.  Incisions look good.  Discharge Medications:     Medication List    TAKE these medications        alendronate 70 MG tablet  Commonly known as:  FOSAMAX  Take 70 mg by mouth every 7 (seven) days. Tuesdays. Take with a full glass of water on an empty stomach.     Biotin  1000 MCG tablet  Take 1,000 mcg by mouth daily.     CALCIUM & MAGNESIUM CARBONATES PO  Take 1 tablet by mouth daily.     cholecalciferol 1000 UNITS tablet  Commonly known as:  VITAMIN D  Take 1,000 Units by mouth daily.     [employer] LEG CRAMPS PAIN RELIEF PO  Take 1 tablet by mouth daily as needed (for leg cramps).     glucosamine-chondroitin 500-400 MG tablet  Take 1 tablet by mouth daily.     HYDROcodone-acetaminophen 5-325 MG tablet  Commonly known as:  NORCO/VICODIN  Take 1-2 tablets by mouth every 6 (six) hours as needed.     ibuprofen 200 MG tablet  Commonly known as:  ADVIL,MOTRIN  Take 200 mg by mouth daily as needed for moderate pain.     loratadine 10 MG tablet  Commonly known as:  CLARITIN  Take 10 mg by mouth daily.     multivitamin tablet  Take 1 tablet by mouth daily.     vitamin B-12 1000 MCG tablet  Commonly known as:  CYANOCOBALAMIN  Take 1,000 mcg by mouth daily.     vitamin C 1000 MG tablet  Take 1,000 mg by mouth daily.        Disposition: 01-Home or Self Care        Follow-up Information    Follow up with Rosario Adie., MD. Schedule an appointment as soon as possible for a visit in 2 weeks.   Specialty:  General Surgery   Contact information:   [address] [place] [address] [phone]        Signed: Alphonsa Overall, M.D., [facility] Office:  [phone]  12/11/2014, 7:57 AM

## 2014-12-11 NOTE — Progress Notes (Signed)
General Surgery Note  LOS: 3 days  POD -  3 Days Post-Op  Assessment/Plan: 1.  ROBOT RIGHT COLECTOMY - 12/08/2014 Heidi Marquez  Has done well.  Ready to go home.  I reviewed discharge plans.  I gave her a copy of her path report.  (note: she had a sigmoid resection for colon ca in April, 2016) 2.  Anemia - Hgb - 9.8 - 12/11/2014 2.  DVT prophylaxis - Lovenox   Active Problems:   Colon cancer ([facility])  Subjective:  Doing well.  Eating.  Has passed small BM. Objective:   Filed Vitals:   12/11/14 0628  BP: 151/72  Pulse: 76  Temp: 98.4 F (36.9 C)  Resp: 18     Intake/Output from previous day:  10/28 0701 - 10/29 0700 In: 1280.5 [P.O.:960; I.V.:320.5] Out: 1425 [Urine:1425]  Intake/Output this shift:      Physical Exam:   General: Older WF who is alert and oriented.    HEENT: Normal. Pupils equal. .   Lungs: Clear   Abdomen: Soft.   Wound: Clean   Lab Results:    Recent Labs  12/10/14 0537 12/11/14 0601  WBC 7.6 7.2  HGB 9.9* 9.8*  HCT 30.6* 30.0*  PLT 168 162    BMET   Recent Labs  12/10/14 0537 12/11/14 0601  NA 139 140  K 3.8 4.3  CL 105 106  CO2 28 29  GLUCOSE 113* 99  BUN 7 12  CREATININE 0.72 0.85  CALCIUM 8.3* 8.4*    PT/INR  No results for input(s): LABPROT, INR in the last 72 hours.  ABG  No results for input(s): PHART, HCO3 in the last 72 hours.  Invalid input(s): PCO2, PO2   Studies/Results:  No results found.   Anti-infectives:   Anti-infectives    Start     Dose/Rate Route Frequency Ordered Stop   12/08/14 1700  clindamycin (CLEOCIN) IVPB 900 mg     900 mg 100 mL/hr over 30 Minutes Intravenous 3 times per day 12/08/14 1424 12/08/14 1802   12/08/14 0600  gentamicin (GARAMYCIN) 290 mg in dextrose 5 % 100 mL IVPB  Status:  Discontinued     5 mg/kg  58.9 kg (Adjusted) 107.3 mL/hr over 60 Minutes Intravenous 60 min pre-op 12/07/14 1404 12/07/14 1410   12/08/14 0600  clindamycin (CLEOCIN) IVPB 900 mg     900 mg 100 mL/hr over 30  Minutes Intravenous 60 min pre-op 12/07/14 1411 12/08/14 0932   12/08/14 0600  gentamicin (GARAMYCIN) 290 mg in dextrose 5 % 100 mL IVPB     5 mg/kg  58.9 kg (Adjusted) 107.3 mL/hr over 60 Minutes Intravenous 60 min pre-op 12/07/14 1411 12/08/14 0956   12/07/14 1404  clindamycin (CLEOCIN) IVPB 900 mg  Status:  Discontinued     900 mg 100 mL/hr over 30 Minutes Intravenous 60 min pre-op 12/07/14 1404 12/07/14 1410      Alphonsa Overall, MD, FACS Pager: [facility] Office: [phone] 12/11/2014

## 2014-12-13 ENCOUNTER — Telehealth: Payer: Self-pay | Admitting: *Deleted

## 2014-12-13 NOTE — Telephone Encounter (Signed)
Oncology Nurse Navigator Documentation  Oncology Nurse Navigator Flowsheets 12/13/2014  Navigator Encounter Type Telephone  Treatment Phase Post op follow up  Barriers/Navigation Needs No barriers at this time  Interventions Other-cancelled old lab appointment for 11/2  Time Spent with Patient 15  Bowels moving, eating, pain controlled. Will follow up with Dr. Burr Medico on 11/8 as scheduled.

## 2014-12-15 ENCOUNTER — Telehealth: Payer: Self-pay | Admitting: Hematology

## 2014-12-15 ENCOUNTER — Other Ambulatory Visit: Payer: Self-pay

## 2014-12-15 NOTE — Telephone Encounter (Signed)
per Dr Burr Medico to move pt MD appt-cld & spoke to pt and gave pt r/s time for 11/9 appt @11 :45

## 2014-12-21 ENCOUNTER — Ambulatory Visit: Payer: Self-pay | Admitting: Hematology

## 2014-12-22 ENCOUNTER — Encounter: Payer: Self-pay | Admitting: Hematology

## 2014-12-22 ENCOUNTER — Encounter: Payer: Self-pay | Admitting: *Deleted

## 2014-12-22 ENCOUNTER — Ambulatory Visit (HOSPITAL_BASED_OUTPATIENT_CLINIC_OR_DEPARTMENT_OTHER): Payer: Commercial Managed Care - HMO | Admitting: Hematology

## 2014-12-22 ENCOUNTER — Telehealth: Payer: Self-pay | Admitting: Hematology

## 2014-12-22 VITALS — BP 153/73 | HR 96 | Temp 98.0°F | Resp 18 | Ht 63.0 in | Wt 144.3 lb

## 2014-12-22 DIAGNOSIS — R918 Other nonspecific abnormal finding of lung field: Secondary | ICD-10-CM | POA: Diagnosis not present

## 2014-12-22 DIAGNOSIS — C189 Malignant neoplasm of colon, unspecified: Secondary | ICD-10-CM | POA: Diagnosis not present

## 2014-12-22 DIAGNOSIS — D649 Anemia, unspecified: Secondary | ICD-10-CM

## 2014-12-22 NOTE — Progress Notes (Signed)
- [place]  [phone] [phone]) [phone]  Clinic Follow Up Note   Patient Care Team: Heidi Solian, MD as PCP - General (Internal Medicine) Heidi Pollack, MD as Consulting Physician (Cardiothoracic Surgery) Heidi Mayer, MD as Consulting Physician (Gastroenterology) Heidi Ruff, MD as Consulting Physician (General Surgery) Heidi Ade, RN as Registered Nurse (Medical Oncology) 12/22/2014  CHIEF COMPLAINTS:  Follow-up of colon cancer    Colon cancer ([place])   04/09/2014 Procedure Colonoscopy per Dr. Silvano Marquez: Large fleshy mass sigmoid colon 25 cm from anus. Could not pass through.   04/09/2014 Tumor Marker CEA =11.7   04/12/2014 Imaging CT C/A/P:sigmoid colon mass;several small areas of soft tissue nodularity in peritoneal cavity; small attenuation structure posterior right hepatic lobe; 2 stable nodules in lungs dating back to 2014. No acute findings.   05/17/2014 Initial Diagnosis Colon cancer-presented with heme + stool and more frequent BMs   05/17/2014 Surgery Robot assisted sigmoidectomy   05/17/2014 Pathologic Stage pT3,pN0,pMX--Grade 1--0/13 nodes +--MMR normal   05/17/2014 Clinical Stage Stage II   12/08/2014 Surgery right hemicolectomy    12/08/2014 Pathology Results right colon adenocarcinoma, pT4aN1c, 19 nodes were negative, LVI(+), MMR normal     HISTORY OF PRESENTING ILLNESS:  Heidi Marquez 81 y.o. female is here because of recently diagnosed a stage II sigmoid colon cancer.  She had annual physical with her PCP in early Feb this year, and stool OB (+), no overt GI bleeding, CBC was normal. She had noticed some loose BM before that, but no nausea, loss of appetite, weight loss or other symoptoms. Colonoscopy showed a large mass in the sigmoid colon 25 cm from anus. Biopsy was taken which showed tubulovillous adenoma, no malignancy. She was referred to surgeon Dr. Marcello Marquez and underwent robotic-assisted sigmoidectomy on 4/4. She tolerated  the procedure well, and was discharged home afterwards.  She has recovered well from the surgery 3 weeks ago, she has mild fatigue, but otherwise doing very well without other complains. She had 2-3 episodes of bleeding after the surgery, which was related to her herromods. She is eating well. 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   She has chronic knee pain she takes ibuprofen once daily.    INTERIM HISTORY: Heidi Marquez returns for follow-up. She underwent right colon segmental resection on 12/08/2014 by Dr. Marcello Marquez. She tolerated the procedure well, and has recovered well. Her pain at the incision site is minimal, although she has occasional abdominal cramps. She has loose bowel movement, 2-4 times a day. No nausea or bloating. She has good appetite and energy level, she remains moderately active, tolerating routine activity without difficulty.   MEDICAL HISTORY:  Past Medical History  Diagnosis Date  . Diverticulosis of colon     severe in sigmoid but diffusley throughout colon  on 2009 colonoscopy  . Internal hemorrhoids   . Hematochezia 2009  . Benign esophageal stricture 04/2007    EGD with dilatation.  Marland Kitchen GERD (gastroesophageal reflux disease)   . HH (hiatus hernia)   . Macular degeneration   . IBS (irritable bowel syndrome)   . Epistaxis ~ 2005    required cauterization twice.   . Reflux esophagitis   . Allergy   . Cataract     ight eye starting  . Arthritis     knees  . History of skin cancer   . Colon cancer ([place])      COLON CANCER / HX  SKIN CANCER    SURGICAL HISTORY: Past Surgical History  Procedure Laterality Date  . Abdominal hysterectomy      partial with the bladder tack  . Bladder tack    . Retinal laser surgery    . Basal cell resection  08/2011, 11/2011    initially biopsy 08/3011.  repeat excision right nose with grafting from the skin behind the right ear  . Colonoscopy    . Esophagogastroduodenoscopy     . Upper gastrointestinal endoscopy    . Inguinal hernia repair  early 1990s    right  . Colonoscopy with propofol N/A 09/30/2014    Procedure: COLONOSCOPY WITH PROPOFOL;  Surgeon: Heidi Banister, MD;  Location: [facility];  Service: Endoscopy;  Laterality: N/A;  . Colon surgery  APRIL 2016    SIGMOIDECTOMY    SOCIAL HISTORY: Social History   Social History  . Marital Status: Married    Spouse Name: N/A  . Number of Children: N/A  . Years of Education: N/A   Occupational History  . Not on file.   Social History Main Topics  . Smoking status: Passive Smoke Exposure - Never Smoker  . Smokeless tobacco: Never Used  . Alcohol Use: No  . Drug Use: No  . Sexual Activity: Not on file   Other Topics Concern  . Not on file   Social History Narrative   Married, husband is disabled (physical and dementia) and she is caregiver   Has #1 child (daughter) living. Son died of MI at age 37   Worked at [employer] before she retired   Has #3 cats    FAMILY HISTORY: Family History  Problem Relation Age of Onset  . Colon cancer Paternal Grandfather 69    colon cancer   . Esophageal cancer Neg Hx   . Rectal cancer Neg Hx   . Stomach cancer Neg Hx   . Stroke Father   . Heart attack Son 69    died of heart attack     ALLERGIES:  is allergic to penicillins; sulfonamide derivatives; and amoxicillin-pot clavulanate.  MEDICATIONS:  Current Outpatient Prescriptions  Medication Sig Dispense Refill  . alendronate (FOSAMAX) 70 MG tablet Take 70 mg by mouth every 7 (seven) days. Tuesdays. Take with a full glass of water on an empty stomach.    . Ascorbic Acid (VITAMIN C) 1000 MG tablet Take 1,000 mg by mouth daily.    . Biotin 1000 MCG tablet Take 1,000 mcg by mouth daily.     Marland Kitchen CALCIUM & MAGNESIUM CARBONATES PO Take 1 tablet by mouth daily.    . cholecalciferol (VITAMIN D) 1000 UNITS tablet Take 1,000 Units by mouth daily.    Marland Kitchen glucosamine-chondroitin 500-400 MG tablet Take 1  tablet by mouth daily.     . Homeopathic Products ([employer] LEG CRAMPS PAIN RELIEF PO) Take 1 tablet by mouth daily as needed (for leg cramps).    Marland Kitchen HYDROcodone-acetaminophen (NORCO/VICODIN) 5-325 MG tablet Take 1-2 tablets by mouth every 6 (six) hours as needed. 30 tablet 0  . ibuprofen (ADVIL,MOTRIN) 200 MG tablet Take 200 mg by mouth daily as needed for moderate pain.     Marland Kitchen loratadine (CLARITIN) 10 MG tablet Take 10 mg by mouth daily.    . Multiple Vitamin (MULTIVITAMIN) tablet Take 1 tablet by mouth daily.    . vitamin B-12 (CYANOCOBALAMIN) 1000 MCG tablet Take 1,000 mcg by mouth daily.      No current facility-administered medications for this visit.  REVIEW OF SYSTEMS:   Constitutional: Denies fevers, chills or abnormal night sweats Eyes: Denies blurriness of vision, double vision or watery eyes Ears, nose, mouth, throat, and face: Denies mucositis or sore throat Respiratory: Denies cough, dyspnea or wheezes Cardiovascular: Denies palpitation, chest discomfort or lower extremity swelling Gastrointestinal:  Denies nausea, heartburn or change in bowel habits Skin: Denies abnormal skin rashes Lymphatics: Denies new lymphadenopathy or easy bruising Neurological:Denies numbness, tingling or new weaknesses Behavioral/Psych: Mood is stable, no new changes  All other systems were reviewed with the patient and are negative.  PHYSICAL EXAMINATION: ECOG PERFORMANCE STATUS: 1  Filed Vitals:   12/22/14 1157  BP: 153/73  Pulse: 96  Temp: 98 F (36.7 C)  Resp: 18   Filed Weights   12/22/14 1157  Weight: 144 lb 4.8 oz (65.454 kg)    GENERAL:alert, no distress and comfortable SKIN: skin color, texture, turgor are normal, no rashes or significant lesions EYES: normal, conjunctiva are pink and non-injected, sclera clear OROPHARYNX:no exudate, no erythema and lips, buccal mucosa, and tongue normal  NECK: supple, thyroid normal size, non-tender, without nodularity LYMPH:  no palpable  lymphadenopathy in the cervical, axillary or inguinal LUNGS: clear to auscultation and percussion with normal breathing effort HEART: regular rate & rhythm and no murmurs and no lower extremity edema ABDOMEN:abdomen soft, surgical incision and to the low abdomen is well healed. non-tender and normal bowel sounds Musculoskeletal:no cyanosis of digits and no clubbing  PSYCH: alert & oriented x 3 with fluent speech NEURO: no focal motor/sensory deficits  LABORATORY DATA:  I have reviewed the data as listed CBC Latest Ref Rng 12/11/2014 12/10/2014 12/09/2014  WBC 4.0 - 10.5 K/uL 7.2 7.6 8.1  Hemoglobin 12.0 - 15.0 g/dL 9.8(L) 9.9(L) 8.9(L)  Hematocrit 36.0 - 46.0 % 30.0(L) 30.6(L) 27.4(L)  Platelets 150 - 400 K/uL 162 168 151     Recent Labs  04/09/14 1507  06/08/14 1535 09/07/14 0946  12/09/14 0452 12/10/14 0537 12/11/14 0601  NA 140  < > 145 144  < > 138 139 140  K 4.1  < > 4.0 4.2  < > 3.5 3.8 4.3  CL 105  < >  --   --   < > 105 105 106  CO2 32  < > 25 29  < > '27 28 29  ' GLUCOSE 93  < > 112 86  < > 112* 113* 99  BUN 15  < > 19.3 25.7  < > '14 7 12  ' CREATININE 0.92  < > 1.1 0.9  < > 0.91 0.72 0.85  CALCIUM 9.0  < > 9.3 9.2  < > 7.6* 8.3* 8.4*  GFRNONAA  --   < >  --   --   < > 58* >60 >60  GFRAA  --   < >  --   --   < > >60 >60 >60  PROT 6.7  --  6.3* 6.3*  --   --   --   --   ALBUMIN 4.1  --  3.5 3.6  --   --   --   --   AST 22  --  22 22  --   --   --   --   ALT 14  --  12 16  --   --   --   --   ALKPHOS 98  --  81 98  --   --   --   --   BILITOT 0.7  --  0.41 0.59  --   --   --   --   < > = values in this interval not displayed.   Pathology report: Diagnosis 05/17/2014 1. Colon, segmental resection for tumor, sigmoid, open end proximal - INVASIVE ADENOCARCINOMA WITH EXTRACELLULAR MUCIN, INVADING THROUGH THE MUSCULARIS PROPRIA INTO PERICOLONIC FATTY TISSUE. - THIRTEEN LYMPH NODES, NEGATIVE FOR METASTATIC CARCINOMA (0/13). - MULTIPLE DIVERTICULA. - RESECTION MARGINS,  NEGATIVE FOR ATYPIA OR MALIGNANCY. - PLEASE SEE ONCOLOGY TEMPLATE FOR DETAILS. 2. Colon, resection margin (donut), sigmoid - BENIGN COLONIC MUCOSA, NO EVIDENCE OF DYSPLASIA OR MALIGNANCY. Specimen: Left colon. Procedure: Segmental resection. Tumor site: Sigmoid colon. Specimen integrity: Intact. Macroscopic intactness of mesorectum: N/A Macroscopic tumor perforation: No Invasive tumor: Invasive adenocarcinoma with extracellular mucin. Maximum size: 8.0 cm, gross measurement. Histologic type(s): Invasive adenocarcinoma with extracellular mucin. Histologic grade and differentiation: G1: well differentiated/low grade Type of polyp in which invasive carcinoma arose: Villous adenoma. Microscopic extension of invasive tumor: Invading through the muscularis propria into pericolonic fatty tissue. Lymph-Vascular invasion: Not identified. Peri-neural invasion: Not identified. Tumor deposit(s) (discontinuous extramural extension): None. Resection margins: Negative. Proximal margin: 9.6 cm. Distal margin: 9.6 cm. Mesenteric margin (sigmoid and transverse): 6 cm. Treatment effect (neo-adjuvant therapy): No Additional polyp(s): N/A Non-neoplastic findings: Prominent Crohn-like reaction around the tumor. Lymph nodes: number examined 13; number positive: 0 Pathologic Staging: pT3, pN0, pMX  Diagnosis 12/08/2014  Colon, segmental resection for tumor, right colon MODERATELY DIFFERENTIATED ADENOCARCINOMA OF THE COLON (3.8 CM) THE TUMOR PENETRATES TO THE SURFACE OF THE VISCERAL PERITONEUM (T4A) LYMPHOVASCULAR INVASION IS IDENTIFIED MARGINS OF RESECTION ARE NEGATIVE FOR TUMOR NINTEEN BENIGN LYMPH NODE (0/19) ONE TUMOR DEPOSITE IS IDENTIFIED ([place]) Specimen: Colon Procedure: Segmental resection Tumor site: Ascending colon Specimen integrity: Intact Macroscopic intactness of mesorectum: Not applicable: Macroscopic tumor perforation: The tumor invades through the muscularis propria penatrate the  surface of serosa Invasive tumor: Maximum size: 3.8 Histologic type(s): Adenocarcinoma G2 G2: moderately differentiated/low grade Type of polyp in which invasive carcinoma arose: Tubular adenoma Microscopic extension of invasive tumor: Serosa Lymph-Vascular invasion: Present Peri-neural invasion: Negative Tumor deposit(s) (discontinuous extramural extension): Present Resection margins: Proximal margin: 12 cm Distal margin: 23 cm Circumferential (radial) (posterior ascending, posterior descending; lateral and posterior mid-rectum; and entire lower 1/3 rectum):3.5 cm Mesenteric margin (sigmoid and transverse): NA Distance closest margin (if all above margins negative): _ Treatment effect (neo-adjuvant therapy): Negative Additional polyp(s): Negative Non-neoplastic findings: Unremarkabel Lymph nodes: number examined 19; number positive: 0 Pathologic Staging: T4a, N1c, M_  MMR: NORMAL MSI: STABLE  Mismatch Repair (MMR) Protein Immunohistochemistry (IHC) IHC Expression Result: MLH1: Preserved nuclear expression (greater 50% tumor expression) MSH2: Preserved nuclear expression (greater 50% tumor expression) MSH6: Preserved nuclear expression (greater 50% tumor expression) PMS2: Preserved nuclear expression (greater 50% tumor expression) * Internal control demonstrates intact nuclear expression Interpretation: NORMAL   RADIOGRAPHIC STUDIES: I have personally reviewed the radiological images as listed and agreed with the findings in the report.  CT chest, abdomen and pelvis with contrast 09/16/2014 IMPRESSION: Annular constricting masslike soft tissue density involving the cecum measuring 2.3 x 3.3 cm, highly suspicious for metachronous primary colon carcinoma. Colonoscopy is recommended for further evaluation.  Increased size of 1.5 cm right pericolonic soft tissue nodule or lymph node, consistent with metastatic disease. Small less than 1 cm right lower quadrant mesenteric  lymph nodes remain stable; local metastatic disease cannot be excluded.  No liver metastases identified.  Two new ill-defined pulmonary nodules in right upper lobe and left lower lobe, suspicious for pulmonary  metastases.  Stable incidental findings including 4.0 cm ascending thoracic aortic aneurysm, left thyroid lobe nodule, and cholelithiasis  ASSESSMENT & PLAN:  81 year old Caucasian female, with past medical history of colon diverticulosis and arthritis, but otherwise healthy and active, who was found to have a large sigmoid tumor, status post sigmoidectomy.  1. Sigmoid colon adenocarcinoma, pT3N0M0, stage II, G1, MSI stable, ascending colon adenocarcinoma, pT4aN1cM0, stage IIIB, MSI stable, G2 -She had multifocal (2) colon cancer status post computed surgical resection,  -I reviewed her recent surgical pathology results in great details with her. The right colon cancer has several high-risk features, including stage IIIB, T4a lesion, tumor deposits, lymphovascular invasion, her risk of colon cancer recurrence is high.  - we discussed that the standard care for stage III colon cancer is adjuvant chemotherapy with FOLFOX. Given her advanced age, I do not think she is a good candidate for FOLFOX. She does have preserved functional status and limited comorbidities, I think she would be a candidate for single agent 5-FU or Xeloda. The benefit and side effects were discussed with her, she agrees to try.  --Chemotherapy consent: Side effects including but does not not limited to, fatigue, nausea, vomiting, diarrhea, hair loss, neuropathy, fluid retention, renal and kidney dysfunction, neutropenic fever, needed for blood transfusion, bleeding, coronary artery spasm and heart attack, skin rash especially hand-and-foot syndrome, were discussed with patient in great detail. She agrees to proceed. -I'll tentatively start her on Xeloda in early Dec. I'll send a prescription to [place] to  see if she has reasonable co-pay.  2. anemia --Secondary to surgery and GI bleeding from tumors  -We'll repeat her CBC on her next visit   3. Lung nodules -She has two small right lung nodules which were discovered on the CT scan,  RUL nodule slightly bigger on recent CT -She has been follow-up with Dr. Cyndia Bent, I will discuss with him.  -she never smoked, low risk for lung cancer -will repeat CT scan before we start her on chemo, to rule out new mets  Plan -repeat CT chest -RTC and start her on Xeloda in early Dec, I'll send a prescription to [place] questions were answered. The patient knows to call the clinic with any problems, questions or concerns.  I spent 20 minutes counseling the patient face to face. The total time spent in the appointment was 30 minutes and more than 50% was on counseling.     Truitt Merle, MD 12/22/2014 11:03 PM

## 2014-12-22 NOTE — Progress Notes (Signed)
Oncology Nurse Navigator Documentation  Oncology Nurse Navigator Flowsheets 12/22/2014  Navigator Encounter Type F/U after surgery  Patient Visit Type Medonc  Treatment Phase Treatment planning  Barriers/Navigation Needs Education  Education Understanding  Treatment Options; Symptom Management  Interventions Education Method  Education Method Verbal;Written;Teach-back--Handout on Xeloda and discussed diarrhea management w/Imodium  Time Spent with Patient 30  Reviewed anatomy of colon and why stools are more loose now.

## 2014-12-22 NOTE — Telephone Encounter (Signed)
per pof to sch pt appt-gave pt copy of avs-adv [place] willc all to sch scan

## 2014-12-23 ENCOUNTER — Telehealth: Payer: Self-pay | Admitting: Pharmacist

## 2014-12-23 ENCOUNTER — Telehealth: Payer: Self-pay | Admitting: *Deleted

## 2014-12-23 ENCOUNTER — Other Ambulatory Visit: Payer: Self-pay | Admitting: Hematology

## 2014-12-23 DIAGNOSIS — C189 Malignant neoplasm of colon, unspecified: Secondary | ICD-10-CM

## 2014-12-23 DIAGNOSIS — Z5111 Encounter for antineoplastic chemotherapy: Secondary | ICD-10-CM

## 2014-12-23 MED ORDER — CAPECITABINE 500 MG PO TABS: 1000.0000 mg/m2 | ORAL_TABLET | Freq: Two times a day (BID) | ORAL | Status: DC

## 2014-12-23 NOTE — Telephone Encounter (Signed)
Prescription for xeloda given to [facility].

## 2014-12-23 NOTE — Telephone Encounter (Signed)
11/10 - sent Rx for Xeloda to [facility] long outpatient pharmacy

## 2014-12-27 ENCOUNTER — Other Ambulatory Visit: Payer: Commercial Managed Care - HMO

## 2014-12-27 ENCOUNTER — Other Ambulatory Visit: Payer: Self-pay | Admitting: *Deleted

## 2014-12-27 ENCOUNTER — Ambulatory Visit (HOSPITAL_COMMUNITY): Payer: Commercial Managed Care - HMO

## 2014-12-27 ENCOUNTER — Telehealth: Payer: Self-pay | Admitting: Pharmacist

## 2014-12-27 NOTE — Telephone Encounter (Signed)
11/14 - Xeloda has been approved under Medicare Part B benefits plan by [facility]. Will follow up with Heidi Marquez for Copay assitance.

## 2014-12-29 ENCOUNTER — Ambulatory Visit (HOSPITAL_COMMUNITY)
Admission: RE | Admit: 2014-12-29 | Discharge: 2014-12-29 | Disposition: A | Discharge status: Home / Self Care | Payer: Commercial Managed Care - HMO | Source: Ambulatory Visit | Attending: Hematology | Admitting: Hematology

## 2014-12-29 ENCOUNTER — Encounter (HOSPITAL_COMMUNITY): Payer: Self-pay

## 2014-12-29 DIAGNOSIS — K449 Diaphragmatic hernia without obstruction or gangrene: Secondary | ICD-10-CM | POA: Insufficient documentation

## 2014-12-29 DIAGNOSIS — K8020 Calculus of gallbladder without cholecystitis without obstruction: Secondary | ICD-10-CM | POA: Insufficient documentation

## 2014-12-29 DIAGNOSIS — E042 Nontoxic multinodular goiter: Secondary | ICD-10-CM | POA: Insufficient documentation

## 2014-12-29 DIAGNOSIS — R911 Solitary pulmonary nodule: Secondary | ICD-10-CM | POA: Insufficient documentation

## 2014-12-29 DIAGNOSIS — C189 Malignant neoplasm of colon, unspecified: Secondary | ICD-10-CM

## 2014-12-29 MED ORDER — IOHEXOL 300 MG/ML  SOLN
75.0000 mL | Freq: Once | INTRAMUSCULAR | Status: AC | PRN
  Administered 2014-12-29: 75 mL via INTRAVENOUS

## 2015-01-03 ENCOUNTER — Encounter: Payer: Self-pay | Admitting: Pharmacist

## 2015-01-03 ENCOUNTER — Telehealth: Payer: Self-pay | Admitting: *Deleted

## 2015-01-03 NOTE — Telephone Encounter (Signed)
Called to report she has called [place] [facility] without success. Expresses concern because her insurance has denied coverage on her Xeloda.  Wishes to speak with someone about this and what can be done? Forwarded message to Gerrit Halls, Pharm D.

## 2015-01-03 NOTE — Progress Notes (Signed)
Oral Chemotherapy Pharmacist Encounter   I spoke with patient for overview of new oral chemotherapy medication: Xeloda. Pt is doing well. The prescriptions have been sent to the [place] [facility] Her medicare will not cover this under Part D but will cover under Part B. Unfortunately, Heidi Marquez does not have a supplemental insurance plan and her copay is $559. She is ok with the price as she does not want to switch to the IV 5-Fluorouracil formulation. All outside assistance funds currently closed. Will recheck periodically and in the new year to see if funds open up.   Heidi Marquez had received a letter about the denial of Part D coverage and was confused (The letter stated Part B would cover the xeloda as well). She called office on Friday and left a message. Called back and Clarified on Monday 11/21  Counseling took place at chemo education class last week  Counseled patient on administration, dosing, side effects, safe handling, and monitoring. Side effects include but not limited to: diarrhea, fatigue, rash, hand foot syndrome, and mouth sores.  Heidi Marquez voiced understanding and appreciation.   All questions answered.  Will follow up in 2 weeks for adherence and toxicity management. Patient plans to start medication on 12/5. Heidi Marquez has follow up on 12/6 with Dr. Burr Medico  Thank you,  Montel Clock, PharmD, [facility]

## 2015-01-18 ENCOUNTER — Other Ambulatory Visit (HOSPITAL_BASED_OUTPATIENT_CLINIC_OR_DEPARTMENT_OTHER): Payer: Commercial Managed Care - HMO

## 2015-01-18 ENCOUNTER — Encounter: Payer: Self-pay | Admitting: Hematology

## 2015-01-18 ENCOUNTER — Telehealth: Payer: Self-pay | Admitting: Hematology

## 2015-01-18 ENCOUNTER — Ambulatory Visit (HOSPITAL_BASED_OUTPATIENT_CLINIC_OR_DEPARTMENT_OTHER): Payer: Commercial Managed Care - HMO | Admitting: Hematology

## 2015-01-18 VITALS — BP 164/74 | HR 80 | Temp 98.5°F | Resp 18 | Ht 63.0 in | Wt 147.1 lb

## 2015-01-18 DIAGNOSIS — C189 Malignant neoplasm of colon, unspecified: Secondary | ICD-10-CM

## 2015-01-18 DIAGNOSIS — R918 Other nonspecific abnormal finding of lung field: Secondary | ICD-10-CM | POA: Diagnosis not present

## 2015-01-18 DIAGNOSIS — D649 Anemia, unspecified: Secondary | ICD-10-CM

## 2015-01-18 DIAGNOSIS — C187 Malignant neoplasm of sigmoid colon: Secondary | ICD-10-CM

## 2015-01-18 LAB — COMPREHENSIVE METABOLIC PANEL
ALK PHOS: 121 U/L (ref 40–150)
ALT: 18 U/L (ref 0–55)
AST: 24 U/L (ref 5–34)
Albumin: 3.8 g/dL (ref 3.5–5.0)
Anion Gap: 10 mEq/L (ref 3–11)
BILIRUBIN TOTAL: 0.72 mg/dL (ref 0.20–1.20)
BUN: 14.4 mg/dL (ref 7.0–26.0)
CO2: 28 mEq/L (ref 22–29)
CREATININE: 0.9 mg/dL (ref 0.6–1.1)
Calcium: 9.9 mg/dL (ref 8.4–10.4)
Chloride: 106 mEq/L (ref 98–109)
EGFR: 60 mL/min/{1.73_m2} — ABNORMAL LOW (ref >90)
GLUCOSE: 80 mg/dL (ref 70–140)
Potassium: 3.9 mEq/L (ref 3.5–5.1)
Sodium: 144 mEq/L (ref 136–145)
Total Protein: 7.1 g/dL (ref 6.4–8.3)

## 2015-01-18 LAB — CBC & DIFF AND RETIC
BASO%: 0.7 % (ref 0.0–2.0)
Basophils Absolute: 0.1 10*3/uL (ref 0.0–0.1)
EOS%: 3.6 % (ref 0.0–7.0)
Eosinophils Absolute: 0.3 10*3/uL (ref 0.0–0.5)
HCT: 36.6 % (ref 34.8–46.6)
HGB: 11.8 g/dL (ref 11.6–15.9)
IMMATURE RETIC FRACT: 8.4 % (ref 1.60–10.00)
LYMPH#: 1.2 10*3/uL (ref 0.9–3.3)
LYMPH%: 16.5 % (ref 14.0–49.7)
MCH: 29.3 pg (ref 25.1–34.0)
MCHC: 32.2 g/dL (ref 31.5–36.0)
MCV: 90.8 fL (ref 79.5–101.0)
MONO#: 0.5 10*3/uL (ref 0.1–0.9)
MONO%: 7.2 % (ref 0.0–14.0)
NEUT%: 72 % (ref 38.4–76.8)
NEUTROS ABS: 5 10*3/uL (ref 1.5–6.5)
Platelets: 184 10*3/uL (ref 145–400)
RBC: 4.03 10*6/uL (ref 3.70–5.45)
RDW: 14.8 % — AB (ref 11.2–14.5)
RETIC %: 1.39 % (ref 0.70–2.10)
RETIC CT ABS: 56.02 10*3/uL (ref 33.70–90.70)
WBC: 7 10*3/uL (ref 3.9–10.3)

## 2015-01-18 NOTE — Progress Notes (Signed)
- [place]  [phone] [phone]  Clinic Follow Up Note   Patient Care Team: Prince Solian, MD as PCP - General (Internal Medicine) Gaye Pollack, MD as Consulting Physician (Cardiothoracic Surgery) Gatha Mayer, MD as Consulting Physician (Gastroenterology) Leighton Ruff, MD as Consulting Physician (General Surgery) Tania Ade, RN as Registered Nurse (Medical Oncology) 01/18/2015  CHIEF COMPLAINTS:  Follow-up of colon cancer    Colon cancer ([place])   04/09/2014 Procedure Colonoscopy per Dr. Silvano Rusk: Large fleshy mass sigmoid colon 25 cm from anus. Could not pass through.   04/09/2014 Tumor Marker CEA =11.7   04/12/2014 Imaging CT C/A/P:sigmoid colon mass;several small areas of soft tissue nodularity in peritoneal cavity; small attenuation structure posterior right hepatic lobe; 2 stable nodules in lungs dating back to 2014. No acute findings.   05/17/2014 Initial Diagnosis Colon cancer-presented with heme + stool and more frequent BMs   05/17/2014 Surgery Robot assisted sigmoidectomy   05/17/2014 Pathologic Stage pT3,pN0,pMX--Grade 1--0/13 nodes +--MMR normal   05/17/2014 Clinical Stage Stage II   12/08/2014 Surgery right hemicolectomy    12/08/2014 Pathology Results right colon adenocarcinoma, pT4aN1c, 19 nodes were negative, LVI(+), MMR normal     HISTORY OF PRESENTING ILLNESS:  Heidi Marquez 81 y.o. female is here because of recently diagnosed a stage II sigmoid colon cancer.  She had annual physical with her PCP in early Feb this year, and stool OB (+), no overt GI bleeding, CBC was normal. She had noticed some loose BM before that, but no nausea, loss of appetite, weight loss or other symoptoms. Colonoscopy showed a large mass in the sigmoid colon 25 cm from anus. Biopsy was taken which showed tubulovillous adenoma, no malignancy. She was referred to surgeon Dr. Marcello Moores and underwent robotic-assisted sigmoidectomy on 4/4. She tolerated  the procedure well, and was discharged home afterwards.  She has recovered well from the surgery 3 weeks ago, she has mild fatigue, but otherwise doing very well without other complains. She had 2-3 episodes of bleeding after the surgery, which was related to her herromods. She is eating well. 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   She has chronic knee pain she takes ibuprofen once daily.    CURRENT THERAPY: adjuvant chemotherapy capecitabine 1567m q12h, day 1-14, every 21 days, started on 01/17/2015, plan for a total of 6 months   INTERIM HISTORY: Ms WRamoreturns for follow-up. She started adjuvant chemotherapy Xeloda yesterday, tolerating well so far. She had very high co-pay(more than $500) for 2 weeks off medication. She overall feels well, fatigue has improved some, she functions well at home. She denies any significant pain, nausea, diarrhea, or other symptoms.   MEDICAL HISTORY:  Past Medical History  Diagnosis Date  . Diverticulosis of colon     severe in sigmoid but diffusley throughout colon  on 2009 colonoscopy  . Internal hemorrhoids   . Hematochezia 2009  . Benign esophageal stricture 04/2007    EGD with dilatation.  .Marland KitchenGERD (gastroesophageal reflux disease)   . HH (hiatus hernia)   . Macular degeneration   . IBS (irritable bowel syndrome)   . Epistaxis ~ 2005    required cauterization twice.   . Reflux esophagitis   . Allergy   . Cataract     ight eye starting  . Arthritis     knees  . History of skin cancer   . Colon cancer ([place]  COLON CANCER / HX SKIN CANCER    SURGICAL HISTORY: Past Surgical History  Procedure Laterality Date  . Abdominal hysterectomy      partial with the bladder tack  . Bladder tack    . Retinal laser surgery    . Basal cell resection  08/2011, 11/2011    initially biopsy 08/3011.  repeat excision right nose with grafting from the skin behind the right ear  . Colonoscopy    .  Esophagogastroduodenoscopy    . Upper gastrointestinal endoscopy    . Inguinal hernia repair  early 1990s    right  . Colonoscopy with propofol N/A 09/30/2014    Procedure: COLONOSCOPY WITH PROPOFOL;  Surgeon: Milus Banister, MD;  Location: [facility];  Service: Endoscopy;  Laterality: N/A;  . Colon surgery  APRIL 2016    SIGMOIDECTOMY    SOCIAL HISTORY: Social History   Social History  . Marital Status: Married    Spouse Name: N/A  . Number of Children: N/A  . Years of Education: N/A   Occupational History  . Not on file.   Social History Main Topics  . Smoking status: Passive Smoke Exposure - Never Smoker  . Smokeless tobacco: Never Used  . Alcohol Use: No  . Drug Use: No  . Sexual Activity: Not on file   Other Topics Concern  . Not on file   Social History Narrative   Married, husband is disabled (physical and dementia) and she is caregiver   Has #1 child (daughter) living. Son died of MI at age 55   Worked at [employer] before she retired   Has #3 cats    FAMILY HISTORY: Family History  Problem Relation Age of Onset  . Colon cancer Paternal Grandfather 37    colon cancer   . Esophageal cancer Neg Hx   . Rectal cancer Neg Hx   . Stomach cancer Neg Hx   . Stroke Father   . Heart attack Son 15    died of heart attack     ALLERGIES:  is allergic to penicillins; sulfonamide derivatives; and amoxicillin-pot clavulanate.  MEDICATIONS:  Current Outpatient Prescriptions  Medication Sig Dispense Refill  . alendronate (FOSAMAX) 70 MG tablet Take 70 mg by mouth every 7 (seven) days. Tuesdays. Take with a full glass of water on an empty stomach.    . Ascorbic Acid (VITAMIN C) 1000 MG tablet Take 1,000 mg by mouth daily.    . Biotin 1000 MCG tablet Take 1,000 mcg by mouth daily.     Marland Kitchen CALCIUM & MAGNESIUM CARBONATES PO Take 1 tablet by mouth daily.    . capecitabine (XELODA) 500 MG tablet Take 3 tablets (1,500 mg total) by mouth 2 (two) times daily after a  meal. Take for 14 days then off for 7 days 84 tablet 7  . cholecalciferol (VITAMIN D) 1000 UNITS tablet Take 1,000 Units by mouth daily.    Marland Kitchen glucosamine-chondroitin 500-400 MG tablet Take 1 tablet by mouth daily.     . Homeopathic Products ([employer] LEG CRAMPS PAIN RELIEF PO) Take 1 tablet by mouth daily as needed (for leg cramps).    Marland Kitchen HYDROcodone-acetaminophen (NORCO/VICODIN) 5-325 MG tablet Take 1-2 tablets by mouth every 6 (six) hours as needed. 30 tablet 0  . ibuprofen (ADVIL,MOTRIN) 200 MG tablet Take 200 mg by mouth daily as needed for moderate pain.     Marland Kitchen loratadine (CLARITIN) 10 MG tablet Take 10 mg by mouth daily.    . Multiple Vitamin (  MULTIVITAMIN) tablet Take 1 tablet by mouth daily.    . vitamin B-12 (CYANOCOBALAMIN) 1000 MCG tablet Take 1,000 mcg by mouth daily.      No current facility-administered medications for this visit.    REVIEW OF SYSTEMS:   Constitutional: Denies fevers, chills or abnormal night sweats Eyes: Denies blurriness of vision, double vision or watery eyes Ears, nose, mouth, throat, and face: Denies mucositis or sore throat Respiratory: Denies cough, dyspnea or wheezes Cardiovascular: Denies palpitation, chest discomfort or lower extremity swelling Gastrointestinal:  Denies nausea, heartburn or change in bowel habits Skin: Denies abnormal skin rashes Lymphatics: Denies new lymphadenopathy or easy bruising Neurological:Denies numbness, tingling or new weaknesses Behavioral/Psych: Mood is stable, no new changes  All other systems were reviewed with the patient and are negative.  PHYSICAL EXAMINATION: ECOG PERFORMANCE STATUS: 1  Filed Vitals:   01/18/15 1332  BP: 164/74  Pulse: 80  Temp: 98.5 F (36.9 C)  Resp: 18   Filed Weights   01/18/15 1332  Weight: 147 lb 1.6 oz (66.724 kg)    GENERAL:alert, no distress and comfortable SKIN: skin color, texture, turgor are normal, no rashes or significant lesions EYES: normal, conjunctiva are pink and  non-injected, sclera clear OROPHARYNX:no exudate, no erythema and lips, buccal mucosa, and tongue normal  NECK: supple, thyroid normal size, non-tender, without nodularity LYMPH:  no palpable lymphadenopathy in the cervical, axillary or inguinal LUNGS: clear to auscultation and percussion with normal breathing effort HEART: regular rate & rhythm and no murmurs and no lower extremity edema ABDOMEN:abdomen soft, surgical incision and to the low abdomen is well healed. non-tender and normal bowel sounds Musculoskeletal:no cyanosis of digits and no clubbing  PSYCH: alert & oriented x 3 with fluent speech NEURO: no focal motor/sensory deficits  LABORATORY DATA:  I have reviewed the data as listed CBC Latest Ref Rng 01/18/2015 12/11/2014 12/10/2014  WBC 3.9 - 10.3 10e3/uL 7.0 7.2 7.6  Hemoglobin 11.6 - 15.9 g/dL 11.8 9.8(L) 9.9(L)  Hematocrit 34.8 - 46.6 % 36.6 30.0(L) 30.6(L)  Platelets 145 - 400 10e3/uL 184 162 168     Recent Labs  06/08/14 1535 09/07/14 0946  12/09/14 0452 12/10/14 0537 12/11/14 0601 01/18/15 1251  NA 145 144  < > 138 139 140 144  K 4.0 4.2  < > 3.5 3.8 4.3 3.9  CL  --   --   < > 105 105 106  --   CO2 25 29  < > '27 28 29 28  ' GLUCOSE 112 86  < > 112* 113* 99 80  BUN 19.3 25.7  < > '14 7 12 ' 14.4  CREATININE 1.1 0.9  < > 0.91 0.72 0.85 0.9  CALCIUM 9.3 9.2  < > 7.6* 8.3* 8.4* 9.9  GFRNONAA  --   --   < > 58* >60 >60  --   GFRAA  --   --   < > >60 >60 >60  --   PROT 6.3* 6.3*  --   --   --   --  7.1  ALBUMIN 3.5 3.6  --   --   --   --  3.8  AST 22 22  --   --   --   --  24  ALT 12 16  --   --   --   --  18  ALKPHOS 81 98  --   --   --   --  121  BILITOT 0.41 0.59  --   --   --   --  0.72  < > = values in this interval not displayed.   Pathology report: Diagnosis 05/17/2014 1. Colon, segmental resection for tumor, sigmoid, open end proximal - INVASIVE ADENOCARCINOMA WITH EXTRACELLULAR MUCIN, INVADING THROUGH THE MUSCULARIS PROPRIA INTO PERICOLONIC FATTY  TISSUE. - THIRTEEN LYMPH NODES, NEGATIVE FOR METASTATIC CARCINOMA (0/13). - MULTIPLE DIVERTICULA. - RESECTION MARGINS, NEGATIVE FOR ATYPIA OR MALIGNANCY. - PLEASE SEE ONCOLOGY TEMPLATE FOR DETAILS. 2. Colon, resection margin (donut), sigmoid - BENIGN COLONIC MUCOSA, NO EVIDENCE OF DYSPLASIA OR MALIGNANCY. Specimen: Left colon. Procedure: Segmental resection. Tumor site: Sigmoid colon. Specimen integrity: Intact. Macroscopic intactness of mesorectum: N/A Macroscopic tumor perforation: No Invasive tumor: Invasive adenocarcinoma with extracellular mucin. Maximum size: 8.0 cm, gross measurement. Histologic type(s): Invasive adenocarcinoma with extracellular mucin. Histologic grade and differentiation: G1: well differentiated/low grade Type of polyp in which invasive carcinoma arose: Villous adenoma. Microscopic extension of invasive tumor: Invading through the muscularis propria into pericolonic fatty tissue. Lymph-Vascular invasion: Not identified. Peri-neural invasion: Not identified. Tumor deposit(s) (discontinuous extramural extension): None. Resection margins: Negative. Proximal margin: 9.6 cm. Distal margin: 9.6 cm. Mesenteric margin (sigmoid and transverse): 6 cm. Treatment effect (neo-adjuvant therapy): No Additional polyp(s): N/A Non-neoplastic findings: Prominent Crohn-like reaction around the tumor. Lymph nodes: number examined 13; number positive: 0 Pathologic Staging: pT3, pN0, pMX  Diagnosis 12/08/2014  Colon, segmental resection for tumor, right colon MODERATELY DIFFERENTIATED ADENOCARCINOMA OF THE COLON (3.8 CM) THE TUMOR PENETRATES TO THE SURFACE OF THE VISCERAL PERITONEUM (T4A) LYMPHOVASCULAR INVASION IS IDENTIFIED MARGINS OF RESECTION ARE NEGATIVE FOR TUMOR NINTEEN BENIGN LYMPH NODE (0/19) ONE TUMOR DEPOSITE IS IDENTIFIED ([place]) Specimen: Colon Procedure: Segmental resection Tumor site: Ascending colon Specimen integrity: Intact Macroscopic intactness of  mesorectum: Not applicable: Macroscopic tumor perforation: The tumor invades through the muscularis propria penatrate the surface of serosa Invasive tumor: Maximum size: 3.8 Histologic type(s): Adenocarcinoma G2 G2: moderately differentiated/low grade Type of polyp in which invasive carcinoma arose: Tubular adenoma Microscopic extension of invasive tumor: Serosa Lymph-Vascular invasion: Present Peri-neural invasion: Negative Tumor deposit(s) (discontinuous extramural extension): Present Resection margins: Proximal margin: 12 cm Distal margin: 23 cm Circumferential (radial) (posterior ascending, posterior descending; lateral and posterior mid-rectum; and entire lower 1/3 rectum):3.5 cm Mesenteric margin (sigmoid and transverse): NA Distance closest margin (if all above margins negative): _ Treatment effect (neo-adjuvant therapy): Negative Additional polyp(s): Negative Non-neoplastic findings: Unremarkabel Lymph nodes: number examined 19; number positive: 0 Pathologic Staging: T4a, N1c, M_  MMR: NORMAL MSI: STABLE  Mismatch Repair (MMR) Protein Immunohistochemistry (IHC) IHC Expression Result: MLH1: Preserved nuclear expression (greater 50% tumor expression) MSH2: Preserved nuclear expression (greater 50% tumor expression) MSH6: Preserved nuclear expression (greater 50% tumor expression) PMS2: Preserved nuclear expression (greater 50% tumor expression) * Internal control demonstrates intact nuclear expression Interpretation: NORMAL   RADIOGRAPHIC STUDIES: I have personally reviewed the radiological images as listed and agreed with the findings in the report.  CT chest, abdomen and pelvis with contrast 09/16/2014 IMPRESSION: Annular constricting masslike soft tissue density involving the cecum measuring 2.3 x 3.3 cm, highly suspicious for metachronous primary colon carcinoma. Colonoscopy is recommended for further evaluation.  Increased size of 1.5 cm right pericolonic  soft tissue nodule or lymph node, consistent with metastatic disease. Small less than 1 cm right lower quadrant mesenteric lymph nodes remain stable; local metastatic disease cannot be excluded.  No liver metastases identified.  Two new ill-defined pulmonary nodules in right upper lobe and left lower lobe, suspicious for pulmonary metastases.  Stable incidental findings including 4.0 cm ascending thoracic aortic aneurysm, left  thyroid lobe nodule, and cholelithiasis  CT chest 12/29/2014  IMPRESSION: 1. Interval stability of previously described right upper lobe 1.1 cm pulmonary nodule, which has been stable since 09/19/2012, suggesting a benign etiology. 2. Interval decreased size of previously described left lower lobe pulmonary nodule, suggesting a resolving inflammatory lesion. 3. No new significant pulmonary nodules. No evidence of metastatic disease in the chest. 4. Stable multinodular goiter, tiny hiatal hernia and cholelithiasis .  ASSESSMENT & PLAN:  81 year old Caucasian female, with past medical history of colon diverticulosis and arthritis, but otherwise healthy and active, who was found to have a large sigmoid tumor, status post sigmoidectomy.  1. Sigmoid colon adenocarcinoma, pT3N0M0, stage II, G1, MSI stable, ascending colon adenocarcinoma, pT4aN1cM0, stage IIIB, MSI stable, G2 -She had multifocal (2) colon cancer status post computed surgical resection,  -I reviewed her recent surgical pathology results in great details with her. The right colon cancer has several high-risk features, including stage IIIB, T4a lesion, tumor deposits, lymphovascular invasion, her risk of colon cancer recurrence is high.  - we discussed that the standard care for stage III colon cancer is adjuvant chemotherapy with FOLFOX. Given her advanced age, I do not think she is a good candidate for FOLFOX. She does have preserved functional status and limited comorbidities, I think she would be a  candidate for single agent 5-FU or Xeloda. The benefit and side effects were discussed with her, she agrees to try.  -she has started first cycle Xeloda, tolerating well for the first few days. I reviewed the potential side effects with her again, she knows to call me if she has any concerns.  2. anemia --Secondary to surgery and GI bleeding from tumors  -Much improved, hemoglobin 11.8 today  3. Lung nodules -She has two small right lung nodules which were discovered on the CT scan,  RUL nodule slightly bigger on recent CT -She has been follow-up with Dr. Cyndia Bent, I will discuss with him.  -she never smoked, low risk for lung cancer -Repeat his CT chest on 12/29/2014 showed stable right upper lobe pulmonary nodule, and interval decreased size of left lower lobe nodule. No other new lesions. I reviewed her scan result with her.   Plan -continue Xeloda  -RTC in 2-3 weeks for follow up, before her second cycle chemo    All questions were answered. The patient knows to call the clinic with any problems, questions or concerns.  I spent 20 minutes counseling the patient face to face. The total time spent in the appointment was 30 minutes and more than 50% was on counseling.     Truitt Merle, MD 01/18/2015 1:53 PM

## 2015-01-18 NOTE — Telephone Encounter (Signed)
Gave and printed appt sched and avs for pt for DEc °

## 2015-01-19 ENCOUNTER — Telehealth: Payer: Self-pay | Admitting: Hematology

## 2015-01-19 LAB — CEA: CEA: 2.3 ng/mL (ref 0.0–5.0)

## 2015-01-19 NOTE — Telephone Encounter (Signed)
pt cld to r/s appt-gave pt copy of avs

## 2015-01-25 ENCOUNTER — Telehealth: Payer: Self-pay | Admitting: *Deleted

## 2015-01-25 ENCOUNTER — Telehealth: Payer: Self-pay

## 2015-01-25 NOTE — Telephone Encounter (Signed)
Pt is on oral chemo and is asking if dental cleaning of teeth is a "no-no".  S/w Dr Burr Medico and a regular cleaning is OK, if it is a deep cleaning she should have a prophylactic antibiotic. Called pt back and she will call her dentist with this information.

## 2015-01-25 NOTE — Telephone Encounter (Signed)
Left VM asking if OK to have her teeth cleaned while on Xeloda.

## 2015-01-31 ENCOUNTER — Other Ambulatory Visit: Payer: Self-pay

## 2015-01-31 ENCOUNTER — Other Ambulatory Visit (HOSPITAL_BASED_OUTPATIENT_CLINIC_OR_DEPARTMENT_OTHER): Payer: Commercial Managed Care - HMO

## 2015-01-31 ENCOUNTER — Telehealth: Payer: Self-pay | Admitting: Hematology

## 2015-01-31 ENCOUNTER — Ambulatory Visit (HOSPITAL_BASED_OUTPATIENT_CLINIC_OR_DEPARTMENT_OTHER): Payer: Commercial Managed Care - HMO | Admitting: Nurse Practitioner

## 2015-01-31 VITALS — BP 147/73 | HR 77 | Temp 97.5°F | Resp 17 | Ht 63.0 in | Wt 144.0 lb

## 2015-01-31 DIAGNOSIS — K1231 Oral mucositis (ulcerative) due to antineoplastic therapy: Secondary | ICD-10-CM | POA: Diagnosis not present

## 2015-01-31 DIAGNOSIS — C189 Malignant neoplasm of colon, unspecified: Secondary | ICD-10-CM

## 2015-01-31 DIAGNOSIS — R197 Diarrhea, unspecified: Secondary | ICD-10-CM | POA: Diagnosis not present

## 2015-01-31 DIAGNOSIS — R630 Anorexia: Secondary | ICD-10-CM

## 2015-01-31 DIAGNOSIS — L271 Localized skin eruption due to drugs and medicaments taken internally: Secondary | ICD-10-CM | POA: Diagnosis not present

## 2015-01-31 LAB — CBC & DIFF AND RETIC
BASO%: 0.3 % (ref 0.0–2.0)
Basophils Absolute: 0 10*3/uL (ref 0.0–0.1)
EOS ABS: 0.3 10*3/uL (ref 0.0–0.5)
EOS%: 4.2 % (ref 0.0–7.0)
HCT: 34.7 % — ABNORMAL LOW (ref 34.8–46.6)
HEMOGLOBIN: 11.5 g/dL — AB (ref 11.6–15.9)
IMMATURE RETIC FRACT: 10.9 % — AB (ref 1.60–10.00)
LYMPH#: 0.8 10*3/uL — AB (ref 0.9–3.3)
LYMPH%: 13.2 % — AB (ref 14.0–49.7)
MCH: 30.3 pg (ref 25.1–34.0)
MCHC: 33.1 g/dL (ref 31.5–36.0)
MCV: 91.3 fL (ref 79.5–101.0)
MONO#: 0.5 10*3/uL (ref 0.1–0.9)
MONO%: 8.9 % (ref 0.0–14.0)
NEUT%: 73.4 % (ref 38.4–76.8)
NEUTROS ABS: 4.4 10*3/uL (ref 1.5–6.5)
Platelets: 180 10*3/uL (ref 145–400)
RBC: 3.8 10*6/uL (ref 3.70–5.45)
RDW: 15.3 % — AB (ref 11.2–14.5)
RETIC %: 0.76 % (ref 0.70–2.10)
RETIC CT ABS: 28.88 10*3/uL — AB (ref 33.70–90.70)
WBC: 6 10*3/uL (ref 3.9–10.3)

## 2015-01-31 LAB — COMPREHENSIVE METABOLIC PANEL
ALBUMIN: 3.6 g/dL (ref 3.5–5.0)
ALK PHOS: 74 U/L (ref 40–150)
ALT: 11 U/L (ref 0–55)
AST: 20 U/L (ref 5–34)
Anion Gap: 8 mEq/L (ref 3–11)
BILIRUBIN TOTAL: 1.05 mg/dL (ref 0.20–1.20)
BUN: 16 mg/dL (ref 7.0–26.0)
CO2: 28 mEq/L (ref 22–29)
Calcium: 9.2 mg/dL (ref 8.4–10.4)
Chloride: 104 mEq/L (ref 98–109)
Creatinine: 0.9 mg/dL (ref 0.6–1.1)
EGFR: 59 mL/min/{1.73_m2} — ABNORMAL LOW (ref >90)
GLUCOSE: 90 mg/dL (ref 70–140)
Potassium: 3.7 mEq/L (ref 3.5–5.1)
SODIUM: 141 meq/L (ref 136–145)
TOTAL PROTEIN: 6.6 g/dL (ref 6.4–8.3)

## 2015-01-31 MED ORDER — MAGIC MOUTHWASH W/LIDOCAINE: 5.0000 mL | Freq: Four times a day (QID) | ORAL | Status: DC | PRN

## 2015-01-31 MED ORDER — DIPHENOXYLATE-ATROPINE 2.5-0.025 MG PO TABS
2.0000 | ORAL_TABLET | Freq: Four times a day (QID) | ORAL | Status: DC | PRN
Start: 2015-01-31 — End: 2015-05-02

## 2015-01-31 NOTE — Telephone Encounter (Signed)
perp of to sch pt appt-pt aware °

## 2015-02-01 ENCOUNTER — Encounter: Payer: Self-pay | Admitting: Nurse Practitioner

## 2015-02-01 DIAGNOSIS — R630 Anorexia: Secondary | ICD-10-CM | POA: Insufficient documentation

## 2015-02-01 DIAGNOSIS — R197 Diarrhea, unspecified: Secondary | ICD-10-CM | POA: Insufficient documentation

## 2015-02-01 DIAGNOSIS — L271 Localized skin eruption due to drugs and medicaments taken internally: Secondary | ICD-10-CM | POA: Insufficient documentation

## 2015-02-01 DIAGNOSIS — K1231 Oral mucositis (ulcerative) due to antineoplastic therapy: Secondary | ICD-10-CM | POA: Insufficient documentation

## 2015-02-01 LAB — CEA: CEA: 1.6 ng/mL (ref 0.0–5.0)

## 2015-02-01 NOTE — Assessment & Plan Note (Signed)
Patient states that she has been experiencing increased diarrhea up to 12-15 times per day over this past weekend.  She has been taking Kaopectate over-the-counter with only minimal effectiveness.  She has not tried Imodium.  Patient states that she last had diarrhea once earlier this morning.  Advised patient that the diarrhea is most likely a side effect of the Xeloda.  Patient was given a prescription for Lomotil today.  She was advised to alternate the Imodium with the Lomotil when needed for diarrhea.  Discussed with patient the need to stay well hydrated; and offered IV fluid rehydration today.  However, patient stated that her diarrhea has much improved today.-And she would prefer to go home and push fluids.  Advised patient to call/return if she felt she needed IV fluid rehydration prior to the holidays.

## 2015-02-01 NOTE — Assessment & Plan Note (Signed)
Patient reports decreased appetite since initiating her first cycle of Xeloda oral therapy.  She was encouraged to push fluids and eat multiple small meals throughout the day if at all possible.

## 2015-02-01 NOTE — Assessment & Plan Note (Signed)
Patient just initiated Xeloda oral therapy approximately 2 weeks ago; and is now complaining of some very mild/trace sensitivity to her fingertips and the soles of her feet.  She intermittently calls this sensation "numbness/tingling" alternating with "sensitivity".    Patient could be experiencing some mild hand/foot syndrome; as well as some initial stages of neuropathy.  Exam revealed that the skin to her hands were very dry; with some trace cracking to her fingertips only.  Patient was advised to avoid extremes in temperature with both her hands and feet; and also to keep them well moisturized.  Gave patient some samples of Aquaphor to try.

## 2015-02-01 NOTE — Assessment & Plan Note (Signed)
Patient is currently undergoing Xeloda oral therapy; and is complaining of some mild mucositis with specific sensitivity to the site of her tongue and the oral mucosa.  On exam I can.  Patient does have some trace, mild lesions to both the right side of her tongue in the right oral mucosa.  Reviewed with the patient the need to use baking soda/salt swish and spit on a very frequent basis; as well as Biotene moisturizing rinse.  We'll also prescribe patient Magic mouthwash with lidocaine to try as well.

## 2015-02-01 NOTE — Assessment & Plan Note (Signed)
Patient initiated the first cycle of Xeloda oral therapy approximately 12 days ago.  She was instructed to take the Xeloda 2 weeks on and one week off.  However, patient developed significant diarrhea and minimal appetite within the past few days; and made the decision to hold any further Xeloda herself.  She last took Xeloda this past Friday, 01/28/2015.  Labs obtained today were within normal limits.  Patient is also complaining of decreased appetite, some mild hand/foot syndrome, questionable neuropathy, and new onset mild mucositis.  She denies any recent fevers or chills.  Patient is scheduled to return on 02/03/2015 for labs and a follow-up visit.

## 2015-02-01 NOTE — Progress Notes (Signed)
SYMPTOM MANAGEMENT CLINIC   HPI: Heidi Marquez 81 y.o. female diagnosed with colon cancer.  Currently undergoing Xeloda oral therapy.  Patient initiated the Xeloda oral therapy approximately 12 days ago.  She was instructed to take the Xeloda 2 weeks on and one week off.  However, patient developed significant diarrhea and minimal appetite within the past few days; and made the decision to hold any further Xeloda herself.  She last took Xeloda this past Friday, 01/28/2015.  Labs obtained today were within normal limits.  Patient is also complaining of decreased appetite, some mild hand/foot syndrome, questionable neuropathy, and new onset mild mucositis.  She denies any recent fevers or chills.  Patient is scheduled to return on 02/03/2015 for labs and a follow-up visit.  HPI  ROS  Past Medical History  Diagnosis Date  . Diverticulosis of colon     severe in sigmoid but diffusley throughout colon  on 2009 colonoscopy  . Internal hemorrhoids   . Hematochezia 2009  . Benign esophageal stricture 04/2007    EGD with dilatation.  Marland Kitchen GERD (gastroesophageal reflux disease)   . HH (hiatus hernia)   . Macular degeneration   . IBS (irritable bowel syndrome)   . Epistaxis ~ 2005    required cauterization twice.   . Reflux esophagitis   . Allergy   . Cataract     ight eye starting  . Arthritis     knees  . History of skin cancer   . Colon cancer ([place])      COLON CANCER / HX SKIN CANCER    Past Surgical History  Procedure Laterality Date  . Abdominal hysterectomy      partial with the bladder tack  . Bladder tack    . Retinal laser surgery    . Basal cell resection  08/2011, 11/2011    initially biopsy 08/3011.  repeat excision right nose with grafting from the skin behind the right ear  . Colonoscopy    . Esophagogastroduodenoscopy    . Upper gastrointestinal endoscopy    . Inguinal hernia repair  early 1990s    right  . Colonoscopy with propofol N/A 09/30/2014   Procedure: COLONOSCOPY WITH PROPOFOL;  Surgeon: Milus Banister, MD;  Location: [facility];  Service: Endoscopy;  Laterality: N/A;  . Colon surgery  APRIL 2016    SIGMOIDECTOMY    has ESOPHAGEAL STRICTURE; GERD; Diverticulosis of colon with hemorrhage; Nodule of right lung; Internal hemorrhoids with complication - Grade 3 prolapse; Rectocele; Pelvic floor dysfunction suspected ; Colon cancer ([place]); Encounter for chemotherapy management; Diarrhea; Hand foot syndrome; Anorexia; and Mucositis due to antineoplastic therapy on her problem list.    is allergic to penicillins; sulfonamide derivatives; and amoxicillin-pot clavulanate.    Medication List       This list is accurate as of: 01/31/15 11:59 PM.  Always use your most recent med list.               alendronate 70 MG tablet  Commonly known as:  FOSAMAX  Take 70 mg by mouth every 7 (seven) days. Tuesdays. Take with a full glass of water on an empty stomach.     Biotin 1000 MCG tablet  Take 1,000 mcg by mouth daily.     CALCIUM & MAGNESIUM CARBONATES PO  Take 1 tablet by mouth daily.     capecitabine 500 MG tablet  Commonly known as:  XELODA  Take 3 tablets (1,500 mg total) by mouth 2 (two) times daily  after a meal. Take for 14 days then off for 7 days     cholecalciferol 1000 UNITS tablet  Commonly known as:  VITAMIN D  Take 1,000 Units by mouth daily.     [employer] LEG CRAMPS PAIN RELIEF PO  Take 1 tablet by mouth daily as needed (for leg cramps).     diphenoxylate-atropine 2.5-0.025 MG tablet  Commonly known as:  LOMOTIL  Take 2 tablets by mouth 4 (four) times daily as needed for diarrhea or loose stools.     glucosamine-chondroitin 500-400 MG tablet  Take 1 tablet by mouth daily.     HYDROcodone-acetaminophen 5-325 MG tablet  Commonly known as:  NORCO/VICODIN  Take 1-2 tablets by mouth every 6 (six) hours as needed.     ibuprofen 200 MG tablet  Commonly known as:  ADVIL,MOTRIN  Take 200 mg by mouth daily as needed  for moderate pain.     loratadine 10 MG tablet  Commonly known as:  CLARITIN  Take 10 mg by mouth daily.     magic mouthwash w/lidocaine Soln  Take 5 mLs by mouth 4 (four) times daily as needed for mouth pain.     multivitamin tablet  Take 1 tablet by mouth daily.     vitamin B-12 1000 MCG tablet  Commonly known as:  CYANOCOBALAMIN  Take 1,000 mcg by mouth daily.     vitamin C 1000 MG tablet  Take 1,000 mg by mouth daily.         PHYSICAL EXAMINATION  Oncology Vitals 01/31/2015 01/18/2015  Height 160 cm 160 cm  Weight 65.318 kg 66.724 kg  Weight (lbs) 144 lbs 147 lbs 2 oz  BMI (kg/m2) 25.51 kg/m2 26.06 kg/m2  Temp 97.5 98.5  Pulse 77 80  Resp 17 18  SpO2 99 100  BSA (m2) 1.7 m2 1.72 m2   BP Readings from Last 2 Encounters:  01/31/15 147/73  01/18/15 164/74    Physical Exam  Constitutional: She is oriented to person, place, and time and well-developed, well-nourished, and in no distress.  HENT:  Head: Normocephalic and atraumatic.  Trace mucositis to the right side of her tongue in the right oral mucosa only.  Eyes: Conjunctivae and EOM are normal. Pupils are equal, round, and reactive to light. Right eye exhibits no discharge. Left eye exhibits no discharge. No scleral icterus.  Neck: Normal range of motion. Neck supple.  Pulmonary/Chest: Effort normal. No respiratory distress.  Musculoskeletal: Normal range of motion. She exhibits no edema or tenderness.  Neurological: She is alert and oriented to person, place, and time. Gait normal.  Skin: Skin is warm and dry. No rash noted. No erythema. No pallor.  Skin to hands bilaterally is very dry; with some initial stages of cracking to her fingertips only.  Psychiatric: Affect normal.  Nursing note and vitals reviewed.   LABORATORY DATA:. Appointment on 01/31/2015  Component Date Value Ref Range Status  . WBC 01/31/2015 6.0  3.9 - 10.3 10e3/uL Final  . NEUT# 01/31/2015 4.4  1.5 - 6.5 10e3/uL Final  . HGB  01/31/2015 11.5* 11.6 - 15.9 g/dL Final  . HCT 01/31/2015 34.7* 34.8 - 46.6 % Final  . Platelets 01/31/2015 180  145 - 400 10e3/uL Final  . MCV 01/31/2015 91.3  79.5 - 101.0 fL Final  . MCH 01/31/2015 30.3  25.1 - 34.0 pg Final  . MCHC 01/31/2015 33.1  31.5 - 36.0 g/dL Final  . RBC 01/31/2015 3.80  3.70 - 5.45 10e6/uL Final  .  RDW 01/31/2015 15.3* 11.2 - 14.5 % Final  . lymph# 01/31/2015 0.8* 0.9 - 3.3 10e3/uL Final  . MONO# 01/31/2015 0.5  0.1 - 0.9 10e3/uL Final  . Eosinophils Absolute 01/31/2015 0.3  0.0 - 0.5 10e3/uL Final  . Basophils Absolute 01/31/2015 0.0  0.0 - 0.1 10e3/uL Final  . NEUT% 01/31/2015 73.4  38.4 - 76.8 % Final  . LYMPH% 01/31/2015 13.2* 14.0 - 49.7 % Final  . MONO% 01/31/2015 8.9  0.0 - 14.0 % Final  . EOS% 01/31/2015 4.2  0.0 - 7.0 % Final  . BASO% 01/31/2015 0.3  0.0 - 2.0 % Final  . Retic % 01/31/2015 0.76  0.70 - 2.10 % Final  . Retic Ct Abs 01/31/2015 28.88* 33.70 - 90.70 10e3/uL Final  . Immature Retic Fract 01/31/2015 10.90* 1.60 - 10.00 % Final  . CEA 01/31/2015 1.6  0.0 - 5.0 ng/mL Final  . Sodium 01/31/2015 141  136 - 145 mEq/L Final  . Potassium 01/31/2015 3.7  3.5 - 5.1 mEq/L Final  . Chloride 01/31/2015 104  98 - 109 mEq/L Final  . CO2 01/31/2015 28  22 - 29 mEq/L Final  . Glucose 01/31/2015 90  70 - 140 mg/dl Final   Glucose reference range is for nonfasting patients. Fasting glucose reference range is 70- 100.  Marland Kitchen BUN 01/31/2015 16.0  7.0 - 26.0 mg/dL Final  . Creatinine 01/31/2015 0.9  0.6 - 1.1 mg/dL Final  . Total Bilirubin 01/31/2015 1.05  0.20 - 1.20 mg/dL Final  . Alkaline Phosphatase 01/31/2015 74  40 - 150 U/L Final  . AST 01/31/2015 20  5 - 34 U/L Final  . ALT 01/31/2015 11  0 - 55 U/L Final  . Total Protein 01/31/2015 6.6  6.4 - 8.3 g/dL Final  . Albumin 01/31/2015 3.6  3.5 - 5.0 g/dL Final  . Calcium 01/31/2015 9.2  8.4 - 10.4 mg/dL Final  . Anion Gap 01/31/2015 8  3 - 11 mEq/L Final  . EGFR 01/31/2015 59* >90 ml/min/1.73 m2 Final     eGFR is calculated using the CKD-EPI Creatinine Equation (2009)     RADIOGRAPHIC STUDIES: No results found.  ASSESSMENT/PLAN:    Mucositis due to antineoplastic therapy Patient is currently undergoing Xeloda oral therapy; and is complaining of some mild mucositis with specific sensitivity to the site of her tongue and the oral mucosa.  On exam I can.  Patient does have some trace, mild lesions to both the right side of her tongue in the right oral mucosa.  Reviewed with the patient the need to use baking soda/salt swish and spit on a very frequent basis; as well as Biotene moisturizing rinse.  We'll also prescribe patient Magic mouthwash with lidocaine to try as well.  Hand foot syndrome Patient just initiated Xeloda oral therapy approximately 2 weeks ago; and is now complaining of some very mild/trace sensitivity to her fingertips and the soles of her feet.  She intermittently calls this sensation "numbness/tingling" alternating with "sensitivity".    Patient could be experiencing some mild hand/foot syndrome; as well as some initial stages of neuropathy.  Exam revealed that the skin to her hands were very dry; with some trace cracking to her fingertips only.  Patient was advised to avoid extremes in temperature with both her hands and feet; and also to keep them well moisturized.  Gave patient some samples of Aquaphor to try.  Diarrhea Patient states that she has been experiencing increased diarrhea up to 12-15 times per day  over this past weekend.  She has been taking Kaopectate over-the-counter with only minimal effectiveness.  She has not tried Imodium.  Patient states that she last had diarrhea once earlier this morning.  Advised patient that the diarrhea is most likely a side effect of the Xeloda.  Patient was given a prescription for Lomotil today.  She was advised to alternate the Imodium with the Lomotil when needed for diarrhea.  Discussed with patient the need to stay well  hydrated; and offered IV fluid rehydration today.  However, patient stated that her diarrhea has much improved today.-And she would prefer to go home and push fluids.  Advised patient to call/return if she felt she needed IV fluid rehydration prior to the holidays.  Colon cancer [facility]) Patient initiated the first cycle of Xeloda oral therapy approximately 12 days ago.  She was instructed to take the Xeloda 2 weeks on and one week off.  However, patient developed significant diarrhea and minimal appetite within the past few days; and made the decision to hold any further Xeloda herself.  She last took Xeloda this past Friday, 01/28/2015.  Labs obtained today were within normal limits.  Patient is also complaining of decreased appetite, some mild hand/foot syndrome, questionable neuropathy, and new onset mild mucositis.  She denies any recent fevers or chills.  Patient is scheduled to return on 02/03/2015 for labs and a follow-up visit.   Anorexia Patient reports decreased appetite since initiating her first cycle of Xeloda oral therapy.  She was encouraged to push fluids and eat multiple small meals throughout the day if at all possible.  Patient stated understanding of all instructions; and was in agreement with this plan of care. The patient knows to call the clinic with any problems, questions or concerns.   Review/collaboration with Dr. Burr Medico regarding all aspects of patient's visit today.   Total time spent with patient was 25 minutes;  with greater than 75 percent of that time spent in face to face counseling regarding patient's symptoms,  and coordination of care and follow up.  Disclaimer:This dictation was prepared with Dragon/digital dictation along with [employer]. Any transcriptional errors that result from this process are unintentional.  Drue Second, NP 02/01/2015

## 2015-02-02 ENCOUNTER — Ambulatory Visit: Payer: Self-pay | Admitting: Hematology

## 2015-02-02 ENCOUNTER — Other Ambulatory Visit: Payer: Self-pay

## 2015-02-03 ENCOUNTER — Ambulatory Visit (HOSPITAL_BASED_OUTPATIENT_CLINIC_OR_DEPARTMENT_OTHER): Payer: Commercial Managed Care - HMO | Admitting: Hematology

## 2015-02-03 ENCOUNTER — Telehealth: Payer: Self-pay | Admitting: Hematology

## 2015-02-03 ENCOUNTER — Other Ambulatory Visit (HOSPITAL_BASED_OUTPATIENT_CLINIC_OR_DEPARTMENT_OTHER): Payer: Commercial Managed Care - HMO

## 2015-02-03 ENCOUNTER — Other Ambulatory Visit: Payer: Self-pay

## 2015-02-03 ENCOUNTER — Encounter: Payer: Self-pay | Admitting: Hematology

## 2015-02-03 VITALS — BP 124/63 | HR 96 | Temp 98.5°F | Resp 18 | Ht 63.0 in | Wt 141.8 lb

## 2015-02-03 DIAGNOSIS — C182 Malignant neoplasm of ascending colon: Secondary | ICD-10-CM

## 2015-02-03 DIAGNOSIS — C189 Malignant neoplasm of colon, unspecified: Secondary | ICD-10-CM

## 2015-02-03 DIAGNOSIS — C187 Malignant neoplasm of sigmoid colon: Secondary | ICD-10-CM

## 2015-02-03 DIAGNOSIS — R918 Other nonspecific abnormal finding of lung field: Secondary | ICD-10-CM

## 2015-02-03 DIAGNOSIS — R197 Diarrhea, unspecified: Secondary | ICD-10-CM

## 2015-02-03 LAB — CBC WITH DIFFERENTIAL/PLATELET
BASO%: 0.4 % (ref 0.0–2.0)
Basophils Absolute: 0 10*3/uL (ref 0.0–0.1)
EOS ABS: 0 10*3/uL (ref 0.0–0.5)
EOS%: 0.3 % (ref 0.0–7.0)
HCT: 38 % (ref 34.8–46.6)
HEMOGLOBIN: 12.3 g/dL (ref 11.6–15.9)
LYMPH%: 7.1 % — AB (ref 14.0–49.7)
MCH: 29.8 pg (ref 25.1–34.0)
MCHC: 32.5 g/dL (ref 31.5–36.0)
MCV: 91.7 fL (ref 79.5–101.0)
MONO#: 0.8 10*3/uL (ref 0.1–0.9)
MONO%: 11.3 % (ref 0.0–14.0)
NEUT%: 80.9 % — ABNORMAL HIGH (ref 38.4–76.8)
NEUTROS ABS: 5.8 10*3/uL (ref 1.5–6.5)
Platelets: 186 10*3/uL (ref 145–400)
RBC: 4.14 10*6/uL (ref 3.70–5.45)
RDW: 14.6 % — ABNORMAL HIGH (ref 11.2–14.5)
WBC: 7.2 10*3/uL (ref 3.9–10.3)
lymph#: 0.5 10*3/uL — ABNORMAL LOW (ref 0.9–3.3)

## 2015-02-03 LAB — COMPREHENSIVE METABOLIC PANEL
ALBUMIN: 3.6 g/dL (ref 3.5–5.0)
ALK PHOS: 77 U/L (ref 40–150)
AST: 17 U/L (ref 5–34)
Anion Gap: 10 mEq/L (ref 3–11)
BILIRUBIN TOTAL: 1.08 mg/dL (ref 0.20–1.20)
BUN: 15.4 mg/dL (ref 7.0–26.0)
CO2: 25 meq/L (ref 22–29)
CREATININE: 1.1 mg/dL (ref 0.6–1.1)
Calcium: 9.4 mg/dL (ref 8.4–10.4)
Chloride: 104 mEq/L (ref 98–109)
EGFR: 47 mL/min/{1.73_m2} — AB (ref >90)
GLUCOSE: 115 mg/dL (ref 70–140)
Potassium: 3.8 mEq/L (ref 3.5–5.1)
SODIUM: 139 meq/L (ref 136–145)
TOTAL PROTEIN: 7 g/dL (ref 6.4–8.3)

## 2015-02-03 LAB — CEA: CEA: 1.4 ng/mL (ref 0.0–5.0)

## 2015-02-03 NOTE — Progress Notes (Signed)
- [place]  Telephone:([phone]) [phone] Fax:(336) [phone]  Clinic Follow Up Note   Patient Care Team: Prince Solian, MD as PCP - General (Internal Medicine) Gaye Pollack, MD as Consulting Physician (Cardiothoracic Surgery) Gatha Mayer, MD as Consulting Physician (Gastroenterology) Leighton Ruff, MD as Consulting Physician (General Surgery) Tania Ade, RN as Registered Nurse (Medical Oncology) 02/03/2015  CHIEF COMPLAINTS:  Follow-up of colon cancer    Colon cancer ([place])   04/09/2014 Procedure Colonoscopy per Dr. Silvano Rusk: Large fleshy mass sigmoid colon 25 cm from anus. Could not pass through.   04/09/2014 Tumor Marker CEA =11.7   04/12/2014 Imaging CT C/A/P:sigmoid colon mass;several small areas of soft tissue nodularity in peritoneal cavity; small attenuation structure posterior right hepatic lobe; 2 stable nodules in lungs dating back to 2014. No acute findings.   05/17/2014 Initial Diagnosis Colon cancer-presented with heme + stool and more frequent BMs   05/17/2014 Surgery Robot assisted sigmoidectomy   05/17/2014 Pathologic Stage pT3,pN0,pMX--Grade 1--0/13 nodes +--MMR normal   05/17/2014 Clinical Stage Stage II   12/08/2014 Surgery right hemicolectomy    12/08/2014 Pathology Results right colon adenocarcinoma, pT4aN1c, 19 nodes were negative, LVI(+), MMR normal     HISTORY OF PRESENTING ILLNESS:  Heidi Marquez 81 y.o. female is here because of recently diagnosed a stage II sigmoid colon cancer.  She had annual physical with her PCP in early Feb this year, and stool OB (+), no overt GI bleeding, CBC was normal. She had noticed some loose BM before that, but no nausea, loss of appetite, weight loss or other symoptoms. Colonoscopy showed a large mass in the sigmoid colon 25 cm from anus. Biopsy was taken which showed tubulovillous adenoma, no malignancy. She was referred to surgeon Dr. Marcello Moores and underwent robotic-assisted sigmoidectomy on 4/4. She  tolerated the procedure well, and was discharged home afterwards.  She has recovered well from the surgery 3 weeks ago, she has mild fatigue, but otherwise doing very well without other complains. She had 2-3 episodes of bleeding after the surgery, which was related to her herromods. She is eating well. 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   She has chronic knee pain she takes ibuprofen once daily.    CURRENT THERAPY: adjuvant chemotherapy capecitabine 1544m q12h, day 1-14, every 21 days, started on 01/17/2015, plan for a total of 6 months   INTERIM HISTORY: Ms WTarmanreturns for follow-up. She did not tolerate adjuvant chemotherapy with Xeloda last used on 01/28/15 due to watery diarrhea, lower abdominal cramping, and chills. She has since no longer taking it and continues to have diarrhea with 5 watery bowel movements daily with mild improvement of symptoms with imodium. She is not able to tolerate PO intake due to diarrhea. She denies fever, nausea, vomiting, or bloody stools.       MEDICAL HISTORY:  Past Medical History  Diagnosis Date  . Diverticulosis of colon     severe in sigmoid but diffusley throughout colon  on 2009 colonoscopy  . Internal hemorrhoids   . Hematochezia 2009  . Benign esophageal stricture 04/2007    EGD with dilatation.  .Marland KitchenGERD (gastroesophageal reflux disease)   . HH (hiatus hernia)   . Macular degeneration   . IBS (irritable bowel syndrome)   . Epistaxis ~ 2005    required cauterization twice.   . Reflux esophagitis   . Allergy   . Cataract     ight eye  starting  . Arthritis     knees  . History of skin cancer   . Colon cancer ([place])      COLON CANCER / HX SKIN CANCER    SURGICAL HISTORY: Past Surgical History  Procedure Laterality Date  . Abdominal hysterectomy      partial with the bladder tack  . Bladder tack    . Retinal laser surgery    . Basal cell resection  08/2011, 11/2011     initially biopsy 08/3011.  repeat excision right nose with grafting from the skin behind the right ear  . Colonoscopy    . Esophagogastroduodenoscopy    . Upper gastrointestinal endoscopy    . Inguinal hernia repair  early 1990s    right  . Colonoscopy with propofol N/A 09/30/2014    Procedure: COLONOSCOPY WITH PROPOFOL;  Surgeon: Milus Banister, MD;  Location: [facility];  Service: Endoscopy;  Laterality: N/A;  . Colon surgery  APRIL 2016    SIGMOIDECTOMY    SOCIAL HISTORY: Social History   Social History  . Marital Status: Married    Spouse Name: N/A  . Number of Children: N/A  . Years of Education: N/A   Occupational History  . Not on file.   Social History Main Topics  . Smoking status: Passive Smoke Exposure - Never Smoker  . Smokeless tobacco: Never Used  . Alcohol Use: No  . Drug Use: No  . Sexual Activity: Not on file   Other Topics Concern  . Not on file   Social History Narrative   Married, husband is disabled (physical and dementia) and she is caregiver   Has #1 child (daughter) living. Son died of MI at age 60   Worked at [employer] before she retired   Has #3 cats    FAMILY HISTORY: Family History  Problem Relation Age of Onset  . Colon cancer Paternal Grandfather 14    colon cancer   . Esophageal cancer Neg Hx   . Rectal cancer Neg Hx   . Stomach cancer Neg Hx   . Stroke Father   . Heart attack Son 70    died of heart attack     ALLERGIES:  is allergic to penicillins; sulfonamide derivatives; and amoxicillin-pot clavulanate.  MEDICATIONS:  Current Outpatient Prescriptions  Medication Sig Dispense Refill  . alendronate (FOSAMAX) 70 MG tablet Take 70 mg by mouth every 7 (seven) days. Tuesdays. Take with a full glass of water on an empty stomach.    . Ascorbic Acid (VITAMIN C) 1000 MG tablet Take 1,000 mg by mouth daily.    . Biotin 1000 MCG tablet Take 1,000 mcg by mouth daily.     Marland Kitchen CALCIUM & MAGNESIUM CARBONATES PO Take 1 tablet by  mouth daily.    . cholecalciferol (VITAMIN D) 1000 UNITS tablet Take 1,000 Units by mouth daily.    . diphenoxylate-atropine (LOMOTIL) 2.5-0.025 MG tablet Take 2 tablets by mouth 4 (four) times daily as needed for diarrhea or loose stools. 30 tablet 0  . glucosamine-chondroitin 500-400 MG tablet Take 1 tablet by mouth daily.     . Homeopathic Products ([employer] LEG CRAMPS PAIN RELIEF PO) Take 1 tablet by mouth daily as needed (for leg cramps).    Marland Kitchen HYDROcodone-acetaminophen (NORCO/VICODIN) 5-325 MG tablet Take 1-2 tablets by mouth every 6 (six) hours as needed. 30 tablet 0  . ibuprofen (ADVIL,MOTRIN) 200 MG tablet Take 200 mg by mouth daily as needed for moderate pain.     Marland Kitchen  loratadine (CLARITIN) 10 MG tablet Take 10 mg by mouth daily.    . magic mouthwash w/lidocaine SOLN Take 5 mLs by mouth 4 (four) times daily as needed for mouth pain. 240 mL 1  . Multiple Vitamin (MULTIVITAMIN) tablet Take 1 tablet by mouth daily.    . vitamin B-12 (CYANOCOBALAMIN) 1000 MCG tablet Take 1,000 mcg by mouth daily.     . capecitabine (XELODA) 500 MG tablet Take 3 tablets (1,500 mg total) by mouth 2 (two) times daily after a meal. Take for 14 days then off for 7 days (Patient not taking: Reported on 01/31/2015) 84 tablet 7   No current facility-administered medications for this visit.    REVIEW OF SYSTEMS:   Constitutional: Denies fevers, chills or abnormal night sweats Eyes: Denies blurriness of vision, double vision or watery eyes Ears, nose, mouth, throat, and face: Denies mucositis or sore throat Respiratory: Denies cough, dyspnea or wheezes Cardiovascular: Denies palpitation, chest discomfort or lower extremity swelling Gastrointestinal:  Denies nausea, heartburn or change in bowel habits Skin: Denies abnormal skin rashes Lymphatics: Denies new lymphadenopathy or easy bruising Neurological:Denies numbness, tingling or new weaknesses Behavioral/Psych: Mood is stable, no new changes  All other systems were  reviewed with the patient and are negative.  PHYSICAL EXAMINATION: ECOG PERFORMANCE STATUS: 1  Filed Vitals:   02/03/15 0852 02/03/15 0855  BP: 124/63 124/63  Pulse: 96 96  Temp: 98.5 F (36.9 C) 98.5 F (36.9 C)  Resp: 18 18   Filed Weights   02/03/15 0852 02/03/15 0855  Weight: 141 lb 12.8 oz (64.32 kg) 141 lb 12.8 oz (64.32 kg)    GENERAL:alert, no distress and comfortable SKIN: skin color, texture, turgor are normal, no rashes or significant lesions EYES: normal, conjunctiva are pink and non-injected, sclera clear OROPHARYNX:no exudate, no erythema and lips, buccal mucosa, and tongue normal  NECK: supple, thyroid normal size, non-tender, without nodularity LYMPH:  no palpable lymphadenopathy in the cervical, axillary or inguinal LUNGS: clear to auscultation and percussion with normal breathing effort HEART: regular rate & rhythm and no murmurs and no lower extremity edema ABDOMEN:abdomen soft, surgical incision and to the low abdomen is well healed. non-tender and normal bowel sounds Musculoskeletal:no cyanosis of digits and no clubbing  PSYCH: alert & oriented x 3 with fluent speech NEURO: no focal motor/sensory deficits  LABORATORY DATA:  I have reviewed the data as listed CBC Latest Ref Rng 02/03/2015 01/31/2015 01/18/2015  WBC 3.9 - 10.3 10e3/uL 7.2 6.0 7.0  Hemoglobin 11.6 - 15.9 g/dL 12.3 11.5(L) 11.8  Hematocrit 34.8 - 46.6 % 38.0 34.7(L) 36.6  Platelets 145 - 400 10e3/uL 186 180 184     Recent Labs  09/07/14 0946  12/09/14 0452 12/10/14 0537 12/11/14 0601 01/18/15 1251 01/31/15 1120  NA 144  < > 138 139 140 144 141  K 4.2  < > 3.5 3.8 4.3 3.9 3.7  CL  --   < > 105 105 106  --   --   CO2 29  < > _0 GLUCOSE 86  < > 112* 113* 99 80 90  BUN 25.7  < > _1 14.4 16.0  CREATININE 0.9  < > 0.91 0.72 0.85 0.9 0.9  CALCIUM 9.2  < > 7.6* 8.3* 8.4* 9.9 9.2  GFRNONAA  --   < > 58* >60 >60  --   --   GFRAA  --   < > >60 >60 >60  --   --  PROT  6.3*  --   --   --   --  7.1 6.6  ALBUMIN 3.6  --   --   --   --  3.8 3.6  AST 22  --   --   --   --  24 20  ALT 16  --   --   --   --  18 11  ALKPHOS 98  --   --   --   --  121 74  BILITOT 0.59  --   --   --   --  0.72 1.05  < > = values in this interval not displayed.   Pathology report: Diagnosis 05/17/2014 1. Colon, segmental resection for tumor, sigmoid, open end proximal - INVASIVE ADENOCARCINOMA WITH EXTRACELLULAR MUCIN, INVADING THROUGH THE MUSCULARIS PROPRIA INTO PERICOLONIC FATTY TISSUE. - THIRTEEN LYMPH NODES, NEGATIVE FOR METASTATIC CARCINOMA (0/13). - MULTIPLE DIVERTICULA. - RESECTION MARGINS, NEGATIVE FOR ATYPIA OR MALIGNANCY. - PLEASE SEE ONCOLOGY TEMPLATE FOR DETAILS. 2. Colon, resection margin (donut), sigmoid - BENIGN COLONIC MUCOSA, NO EVIDENCE OF DYSPLASIA OR MALIGNANCY. Specimen: Left colon. Procedure: Segmental resection. Tumor site: Sigmoid colon. Specimen integrity: Intact. Macroscopic intactness of mesorectum: N/A Macroscopic tumor perforation: No Invasive tumor: Invasive adenocarcinoma with extracellular mucin. Maximum size: 8.0 cm, gross measurement. Histologic type(s): Invasive adenocarcinoma with extracellular mucin. Histologic grade and differentiation: G1: well differentiated/low grade Type of polyp in which invasive carcinoma arose: Villous adenoma. Microscopic extension of invasive tumor: Invading through the muscularis propria into pericolonic fatty tissue. Lymph-Vascular invasion: Not identified. Peri-neural invasion: Not identified. Tumor deposit(s) (discontinuous extramural extension): None. Resection margins: Negative. Proximal margin: 9.6 cm. Distal margin: 9.6 cm. Mesenteric margin (sigmoid and transverse): 6 cm. Treatment effect (neo-adjuvant therapy): No Additional polyp(s): N/A Non-neoplastic findings: Prominent Crohn-like reaction around the tumor. Lymph nodes: number examined 13; number positive: 0 Pathologic Staging: pT3, pN0,  pMX  Diagnosis 12/08/2014  Colon, segmental resection for tumor, right colon MODERATELY DIFFERENTIATED ADENOCARCINOMA OF THE COLON (3.8 CM) THE TUMOR PENETRATES TO THE SURFACE OF THE VISCERAL PERITONEUM (T4A) LYMPHOVASCULAR INVASION IS IDENTIFIED MARGINS OF RESECTION ARE NEGATIVE FOR TUMOR NINTEEN BENIGN LYMPH NODE (0/19) ONE TUMOR DEPOSITE IS IDENTIFIED ([place]) Specimen: Colon Procedure: Segmental resection Tumor site: Ascending colon Specimen integrity: Intact Macroscopic intactness of mesorectum: Not applicable: Macroscopic tumor perforation: The tumor invades through the muscularis propria penatrate the surface of serosa Invasive tumor: Maximum size: 3.8 Histologic type(s): Adenocarcinoma G2 G2: moderately differentiated/low grade Type of polyp in which invasive carcinoma arose: Tubular adenoma Microscopic extension of invasive tumor: Serosa Lymph-Vascular invasion: Present Peri-neural invasion: Negative Tumor deposit(s) (discontinuous extramural extension): Present Resection margins: Proximal margin: 12 cm Distal margin: 23 cm Circumferential (radial) (posterior ascending, posterior descending; lateral and posterior mid-rectum; and entire lower 1/3 rectum):3.5 cm Mesenteric margin (sigmoid and transverse): NA Distance closest margin (if all above margins negative): _ Treatment effect (neo-adjuvant therapy): Negative Additional polyp(s): Negative Non-neoplastic findings: Unremarkabel Lymph nodes: number examined 19; number positive: 0 Pathologic Staging: T4a, N1c, M_  MMR: NORMAL MSI: STABLE  Mismatch Repair (MMR) Protein Immunohistochemistry (IHC) IHC Expression Result: MLH1: Preserved nuclear expression (greater 50% tumor expression) MSH2: Preserved nuclear expression (greater 50% tumor expression) MSH6: Preserved nuclear expression (greater 50% tumor expression) PMS2: Preserved nuclear expression (greater 50% tumor expression) * Internal control demonstrates  intact nuclear expression Interpretation: NORMAL   RADIOGRAPHIC STUDIES: I have personally reviewed the radiological images as listed and agreed with the findings in the report.  CT chest, abdomen and pelvis with contrast 09/16/2014  IMPRESSION: Annular constricting masslike soft tissue density involving the cecum measuring 2.3 x 3.3 cm, highly suspicious for metachronous primary colon carcinoma. Colonoscopy is recommended for further evaluation.  Increased size of 1.5 cm right pericolonic soft tissue nodule or lymph node, consistent with metastatic disease. Small less than 1 cm right lower quadrant mesenteric lymph nodes remain stable; local metastatic disease cannot be excluded.  No liver metastases identified.  Two new ill-defined pulmonary nodules in right upper lobe and left lower lobe, suspicious for pulmonary metastases.  Stable incidental findings including 4.0 cm ascending thoracic aortic aneurysm, left thyroid lobe nodule, and cholelithiasis  CT chest 12/29/2014  IMPRESSION: 1. Interval stability of previously described right upper lobe 1.1 cm pulmonary nodule, which has been stable since 09/19/2012, suggesting a benign etiology. 2. Interval decreased size of previously described left lower lobe pulmonary nodule, suggesting a resolving inflammatory lesion. 3. No new significant pulmonary nodules. No evidence of metastatic disease in the chest. 4. Stable multinodular goiter, tiny hiatal hernia and cholelithiasis .  ASSESSMENT & PLAN:  81 year old Caucasian female, with past medical history of colon diverticulosis and arthritis, but otherwise healthy and active, who was found to have a large sigmoid tumor, status post sigmoidectomy.  1. Sigmoid colon adenocarcinoma, pT3N0M0, stage II, G1, MSI stable, ascending colon adenocarcinoma, pT4aN1cM0, stage IIIB, MSI stable, G2 -She had multifocal (2) colon cancer status post computed surgical resection,  -I reviewed her  recent surgical pathology results in great details with her. The right colon cancer has several high-risk features, including stage IIIB, T4a lesion, tumor deposits, lymphovascular invasion, her risk of colon cancer recurrence is high.  - we discussed that the standard care for stage III colon cancer is adjuvant chemotherapy with FOLFOX. Given her advanced age, I do not think she is a good candidate for FOLFOX. She does have preserved functional status and limited comorbidities, I think she would be a candidate for single agent 5-FU or Xeloda. The benefit and side effects were discussed with her, she agrees to try.  -she has started first cycle Xeloda, developed severe diarrhea -We discussed the option of decreased Xeloda and change to 5-FU infusion  2. Diarrhea -Secondary to chemotherapy. She does not have fever, no increased white count, unlikely C. difficile colitis or infectious etiology. -I encouraged her to fill the prescription of Lomotil, and he uses Imodium and Lomotil to control her diarrhea. -She knows to drink adequate fluids to avoid dehydration.  3. anemia --Secondary to surgery and GI bleeding from tumors  -resolved now   3. Lung nodules -She has two small right lung nodules which were discovered on the CT scan,  RUL nodule slightly bigger on recent CT -She has been follow-up with Dr. Cyndia Bent, I will discuss with him.  -she never smoked, low risk for lung cancer -Repeat his CT chest on 12/29/2014 showed stable right upper lobe pulmonary nodule, and interval decreased size of left lower lobe nodule. No other new lesions. I reviewed her scan result with her.   Plan -hold Xeloda for now -She will feel Lomotil and use it in addition to Imodium to control her diarrhea -RTC in 2 weeks for follow up, if she recovers well, we'll consider lower her Xeloda dose to 1043m bid for cycle 2.   All questions were answered. The patient knows to call the clinic with any problems, questions or  concerns.  I spent 20 minutes counseling the patient face to face. The total time spent in the appointment was 30 minutes  and more than 50% was on counseling.     Truitt Merle, MD 02/03/2015 9:32 AM

## 2015-02-03 NOTE — Telephone Encounter (Signed)
Gave patient avs report and appointments for January  °

## 2015-02-21 ENCOUNTER — Telehealth: Payer: Self-pay | Admitting: *Deleted

## 2015-02-21 ENCOUNTER — Encounter: Payer: Self-pay | Admitting: Hematology

## 2015-02-21 ENCOUNTER — Ambulatory Visit (HOSPITAL_BASED_OUTPATIENT_CLINIC_OR_DEPARTMENT_OTHER): Payer: PPO | Admitting: Hematology

## 2015-02-21 ENCOUNTER — Other Ambulatory Visit (HOSPITAL_BASED_OUTPATIENT_CLINIC_OR_DEPARTMENT_OTHER): Payer: PPO

## 2015-02-21 VITALS — BP 152/91 | HR 66 | Temp 97.9°F | Resp 18 | Ht 63.0 in | Wt 141.1 lb

## 2015-02-21 DIAGNOSIS — C182 Malignant neoplasm of ascending colon: Secondary | ICD-10-CM

## 2015-02-21 DIAGNOSIS — R197 Diarrhea, unspecified: Secondary | ICD-10-CM | POA: Diagnosis not present

## 2015-02-21 DIAGNOSIS — C187 Malignant neoplasm of sigmoid colon: Secondary | ICD-10-CM

## 2015-02-21 DIAGNOSIS — C189 Malignant neoplasm of colon, unspecified: Secondary | ICD-10-CM

## 2015-02-21 DIAGNOSIS — R918 Other nonspecific abnormal finding of lung field: Secondary | ICD-10-CM | POA: Diagnosis not present

## 2015-02-21 DIAGNOSIS — C186 Malignant neoplasm of descending colon: Secondary | ICD-10-CM

## 2015-02-21 LAB — CBC & DIFF AND RETIC
BASO%: 1 % (ref 0.0–2.0)
Basophils Absolute: 0.1 10*3/uL (ref 0.0–0.1)
EOS%: 2.8 % (ref 0.0–7.0)
Eosinophils Absolute: 0.2 10*3/uL (ref 0.0–0.5)
HCT: 37.5 % (ref 34.8–46.6)
HGB: 12.2 g/dL (ref 11.6–15.9)
IMMATURE RETIC FRACT: 1.2 % — AB (ref 1.60–10.00)
LYMPH#: 0.8 10*3/uL — AB (ref 0.9–3.3)
LYMPH%: 11.2 % — ABNORMAL LOW (ref 14.0–49.7)
MCH: 30 pg (ref 25.1–34.0)
MCHC: 32.5 g/dL (ref 31.5–36.0)
MCV: 92.1 fL (ref 79.5–101.0)
MONO#: 0.5 10*3/uL (ref 0.1–0.9)
MONO%: 7.1 % (ref 0.0–14.0)
NEUT%: 77.9 % — AB (ref 38.4–76.8)
NEUTROS ABS: 5.6 10*3/uL (ref 1.5–6.5)
Platelets: 149 10*3/uL (ref 145–400)
RBC: 4.07 10*6/uL (ref 3.70–5.45)
RDW: 17.1 % — ABNORMAL HIGH (ref 11.2–14.5)
RETIC %: 0.71 % (ref 0.70–2.10)
RETIC CT ABS: 28.9 10*3/uL — AB (ref 33.70–90.70)
WBC: 7.1 10*3/uL (ref 3.9–10.3)

## 2015-02-21 LAB — COMPREHENSIVE METABOLIC PANEL
ALT: 21 U/L (ref 0–55)
ANION GAP: 9 meq/L (ref 3–11)
AST: 32 U/L (ref 5–34)
Albumin: 3.7 g/dL (ref 3.5–5.0)
Alkaline Phosphatase: 93 U/L (ref 40–150)
BILIRUBIN TOTAL: 1.08 mg/dL (ref 0.20–1.20)
BUN: 14.2 mg/dL (ref 7.0–26.0)
CHLORIDE: 105 meq/L (ref 98–109)
CO2: 28 meq/L (ref 22–29)
CREATININE: 1 mg/dL (ref 0.6–1.1)
Calcium: 9.4 mg/dL (ref 8.4–10.4)
EGFR: 56 mL/min/{1.73_m2} — ABNORMAL LOW (ref >90)
GLUCOSE: 91 mg/dL (ref 70–140)
Potassium: 4.4 mEq/L (ref 3.5–5.1)
SODIUM: 141 meq/L (ref 136–145)
TOTAL PROTEIN: 6.7 g/dL (ref 6.4–8.3)

## 2015-02-21 MED ORDER — CAPECITABINE 500 MG PO TABS: 1000.0000 mg | ORAL_TABLET | Freq: Two times a day (BID) | ORAL | Status: DC

## 2015-02-21 MED FILL — CAPECITABINE 500 MG TABLET: 500 | 21 days supply | Qty: 56 | Fill #0

## 2015-02-21 NOTE — Progress Notes (Signed)
- [place]  Telephone:([phone] Fax:([phone]) [phone]  Clinic Follow Up Note   Patient Care Team: Prince Solian, MD as PCP - General (Internal Medicine) Gaye Pollack, MD as Consulting Physician (Cardiothoracic Surgery) Gatha Mayer, MD as Consulting Physician (Gastroenterology) Leighton Ruff, MD as Consulting Physician (General Surgery) Tania Ade, RN as Registered Nurse (Medical Oncology) 02/21/2015  CHIEF COMPLAINTS:  Follow-up of colon cancer  Oncology History   Cancer of left colon [facility])   Staging form: Colon and Rectum, AJCC 7th Edition     Pathologic stage from 05/17/2014: Stage IIA (T3, N0, cM0) - Signed by Truitt Merle, MD on 02/21/2015 Cancer of right colon [facility])   Staging form: Colon and Rectum, AJCC 7th Edition     Pathologic stage from 12/08/2014: Stage IIIB (T4a, N1a, cM0) - Signed by Truitt Merle, MD on 02/21/2015       Cancer of right colon ([place])   04/09/2014 Procedure Colonoscopy per Dr. Silvano Rusk: Large fleshy mass sigmoid colon 25 cm from anus. Could not pass through.   04/09/2014 Tumor Marker CEA =11.7   04/12/2014 Imaging CT C/A/P:sigmoid colon mass;several small areas of soft tissue nodularity in peritoneal cavity; small attenuation structure posterior right hepatic lobe; 2 stable nodules in lungs dating back to 2014. No acute findings.   05/17/2014 Initial Diagnosis Colon cancer-presented with heme + stool and more frequent BMs   05/17/2014 Surgery Robot assisted sigmoidectomy   05/17/2014 Pathologic Stage pT3,pN0,pMX--Grade 1--0/13 nodes +--MMR normal   05/17/2014 Clinical Stage Stage II   12/08/2014 Surgery right hemicolectomy    12/08/2014 Pathology Results right colon adenocarcinoma, pT4aN1c, 19 nodes were negative, LVI(+), MMR normal    01/17/2015 -  Chemotherapy adjuvant capecitabine 1588m q12h, 2 weeks on and 1 week off. Second cycle was postponed to 02/22/2015 due to side effects and dose reduced to 10023mq12h/    HISTORY OF PRESENTING  ILLNESS:  Heidi EICHHORN89.o. female is here because of recently diagnosed a stage II sigmoid colon cancer.  She had annual physical with her PCP in early Feb this year, and stool OB (+), no overt GI bleeding, CBC was normal. She had noticed some loose BM before that, but no nausea, loss of appetite, weight loss or other symoptoms. Colonoscopy showed a large mass in the sigmoid colon 25 cm from anus. Biopsy was taken which showed tubulovillous adenoma, no malignancy. She was referred to surgeon Dr. ThMarcello Mooresnd underwent robotic-assisted sigmoidectomy on 4/4. She tolerated the procedure well, and was discharged home afterwards.  She has recovered well from the surgery 3 weeks ago, she has mild fatigue, but otherwise doing very well without other complains. She had 2-3 episodes of bleeding after the surgery, which was related to her herromods. She is eating well. 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   She has chronic knee pain she takes ibuprofen once daily.    CURRENT THERAPY: adjuvant chemotherapy capecitabine 150059m12h, day 1-14, every 21 days, started on 01/17/2015, second cycle was postponed to 1/10 due to side effects and dose reduced to 1000m42m2h   INTERIM HISTORY: Ms WebsOrsiniurns for follow-up. She has recovered very well from the first cycle chemotherapy since about 10 days ago. She now has very good appetite and energy level, no nausea, diarrhea, or other complaints. She lost about 8 pounds since she started chemotherapy.    MEDICAL HISTORY:  Past Medical History  Diagnosis Date  .  Diverticulosis of colon     severe in sigmoid but diffusley throughout colon  on 2009 colonoscopy  . Internal hemorrhoids   . Hematochezia 2009  . Benign esophageal stricture 04/2007    EGD with dilatation.  Marland Kitchen GERD (gastroesophageal reflux disease)   . HH (hiatus hernia)   . Macular degeneration   . IBS (irritable bowel syndrome)   .  Epistaxis ~ 2005    required cauterization twice.   . Reflux esophagitis   . Allergy   . Cataract     ight eye starting  . Arthritis     knees  . History of skin cancer   . Colon cancer ([place])      COLON CANCER / HX SKIN CANCER    SURGICAL HISTORY: Past Surgical History  Procedure Laterality Date  . Abdominal hysterectomy      partial with the bladder tack  . Bladder tack    . Retinal laser surgery    . Basal cell resection  08/2011, 11/2011    initially biopsy 08/3011.  repeat excision right nose with grafting from the skin behind the right ear  . Colonoscopy    . Esophagogastroduodenoscopy    . Upper gastrointestinal endoscopy    . Inguinal hernia repair  early 1990s    right  . Colonoscopy with propofol N/A 09/30/2014    Procedure: COLONOSCOPY WITH PROPOFOL;  Surgeon: Milus Banister, MD;  Location: [facility] ENDOSCOPY;  Service: Endoscopy;  Laterality: N/A;  . Colon surgery  APRIL 2016    SIGMOIDECTOMY    SOCIAL HISTORY: Social History   Social History  . Marital Status: Married    Spouse Name: N/A  . Number of Children: N/A  . Years of Education: N/A   Occupational History  . Not on file.   Social History Main Topics  . Smoking status: Passive Smoke Exposure - Never Smoker  . Smokeless tobacco: Never Used  . Alcohol Use: No  . Drug Use: No  . Sexual Activity: Not on file   Other Topics Concern  . Not on file   Social History Narrative   Married, husband is disabled (physical and dementia) and she is caregiver   Has #1 child (daughter) living. Son died of MI at age 28   Worked at [employer] before she retired   Has #3 cats    FAMILY HISTORY: Family History  Problem Relation Age of Onset  . Colon cancer Paternal Grandfather 33    colon cancer   . Esophageal cancer Neg Hx   . Rectal cancer Neg Hx   . Stomach cancer Neg Hx   . Stroke Father   . Heart attack Son 75    died of heart attack     ALLERGIES:  is allergic to penicillins; sulfonamide  derivatives; and amoxicillin-pot clavulanate.  MEDICATIONS:  Current Outpatient Prescriptions  Medication Sig Dispense Refill  . alendronate (FOSAMAX) 70 MG tablet Take 70 mg by mouth every 7 (seven) days. Tuesdays. Take with a full glass of water on an empty stomach.    . Ascorbic Acid (VITAMIN C) 1000 MG tablet Take 1,000 mg by mouth daily.    . Biotin 1000 MCG tablet Take 1,000 mcg by mouth daily.     Marland Kitchen CALCIUM & MAGNESIUM CARBONATES PO Take 1 tablet by mouth daily.    . cholecalciferol (VITAMIN D) 1000 UNITS tablet Take 1,000 Units by mouth daily.    . diphenoxylate-atropine (LOMOTIL) 2.5-0.025 MG tablet Take 2 tablets by mouth 4 (  four) times daily as needed for diarrhea or loose stools. 30 tablet 0  . glucosamine-chondroitin 500-400 MG tablet Take 1 tablet by mouth daily.     . Homeopathic Products ([employer] LEG CRAMPS PAIN RELIEF PO) Take 1 tablet by mouth daily as needed (for leg cramps).    Marland Kitchen HYDROcodone-acetaminophen (NORCO/VICODIN) 5-325 MG tablet Take 1-2 tablets by mouth every 6 (six) hours as needed. 30 tablet 0  . ibuprofen (ADVIL,MOTRIN) 200 MG tablet Take 200 mg by mouth daily as needed for moderate pain.     Marland Kitchen loratadine (CLARITIN) 10 MG tablet Take 10 mg by mouth daily.    . magic mouthwash w/lidocaine SOLN Take 5 mLs by mouth 4 (four) times daily as needed for mouth pain. 240 mL 1  . Multiple Vitamin (MULTIVITAMIN) tablet Take 1 tablet by mouth daily.    . vitamin B-12 (CYANOCOBALAMIN) 1000 MCG tablet Take 1,000 mcg by mouth daily.     . capecitabine (XELODA) 500 MG tablet Take 2 tablets (1,000 mg total) by mouth 2 (two) times daily after a meal. Take for 14 days then off for 7 days 56 tablet 0   No current facility-administered medications for this visit.    REVIEW OF SYSTEMS:   Constitutional: Denies fevers, chills or abnormal night sweats Eyes: Denies blurriness of vision, double vision or watery eyes Ears, nose, mouth, throat, and face: Denies mucositis or sore  throat Respiratory: Denies cough, dyspnea or wheezes Cardiovascular: Denies palpitation, chest discomfort or lower extremity swelling Gastrointestinal:  Denies nausea, heartburn or change in bowel habits Skin: Denies abnormal skin rashes Lymphatics: Denies new lymphadenopathy or easy bruising Neurological:Denies numbness, tingling or new weaknesses Behavioral/Psych: Mood is stable, no new changes  All other systems were reviewed with the patient and are negative.  PHYSICAL EXAMINATION: ECOG PERFORMANCE STATUS: 1  Filed Vitals:   02/21/15 0923 02/21/15 0924  BP: 152/91 152/91  Pulse: 66 66  Temp: 97.9 F (36.6 C) 97.9 F (36.6 C)  Resp: 18 18   Filed Weights   02/21/15 0923 02/21/15 0924  Weight: 141 lb (63.957 kg) 141 lb 1.6 oz (64.003 kg)    GENERAL:alert, no distress and comfortable SKIN: skin color, texture, turgor are normal, no rashes or significant lesions EYES: normal, conjunctiva are pink and non-injected, sclera clear OROPHARYNX:no exudate, no erythema and lips, buccal mucosa, and tongue normal  NECK: supple, thyroid normal size, non-tender, without nodularity LYMPH:  no palpable lymphadenopathy in the cervical, axillary or inguinal LUNGS: clear to auscultation and percussion with normal breathing effort HEART: regular rate & rhythm and no murmurs and no lower extremity edema ABDOMEN:abdomen soft, surgical incision and to the low abdomen is well healed. non-tender and normal bowel sounds Musculoskeletal:no cyanosis of digits and no clubbing  PSYCH: alert & oriented x 3 with fluent speech NEURO: no focal motor/sensory deficits  LABORATORY DATA:  I have reviewed the data as listed CBC Latest Ref Rng 02/21/2015 02/03/2015 01/31/2015  WBC 3.9 - 10.3 10e3/uL 7.1 7.2 6.0  Hemoglobin 11.6 - 15.9 g/dL 12.2 12.3 11.5(L)  Hematocrit 34.8 - 46.6 % 37.5 38.0 34.7(L)  Platelets 145 - 400 10e3/uL 149 186 180     Recent Labs  12/09/14 0452 12/10/14 0537 12/11/14 0601   01/31/15 1120 02/03/15 0832 02/21/15 0859  NA 138 139 140  < > 141 139 141  K 3.5 3.8 4.3  < > 3.7 3.8 4.4  CL 105 105 106  --   --   --   --  CO2 '27 28 29  ' < > '28 25 28  ' GLUCOSE 112* 113* 99  < > 90 115 91  BUN '14 7 12  ' < > 16.0 15.4 14.2  CREATININE 0.91 0.72 0.85  < > 0.9 1.1 1.0  CALCIUM 7.6* 8.3* 8.4*  < > 9.2 9.4 9.4  GFRNONAA 58* >60 >60  --   --   --   --   GFRAA >60 >60 >60  --   --   --   --   PROT  --   --   --   < > 6.6 7.0 6.7  ALBUMIN  --   --   --   < > 3.6 3.6 3.7  AST  --   --   --   < > 20 17 32  ALT  --   --   --   < > 11 <9 21  ALKPHOS  --   --   --   < > 74 77 93  BILITOT  --   --   --   < > 1.05 1.08 1.08  < > = values in this interval not displayed.  CEA  Status: Finalresult Visible to patient:  Not Released Nextappt: 03/01/2015 at 10:30 AM in Oncology [facility] 4) Dx:  Malignant neoplasm of ascending colon...           Ref Range 2wk ago  3wk ago  35moago  584mogo     CEA 0.0 - 5.0 ng/mL 1.4 1.6 2.3 29.3 (H)         Pathology report: Diagnosis 05/17/2014 1. Colon, segmental resection for tumor, sigmoid, open end proximal - INVASIVE ADENOCARCINOMA WITH EXTRACELLULAR MUCIN, INVADING THROUGH THE MUSCULARIS PROPRIA INTO PERICOLONIC FATTY TISSUE. - THIRTEEN LYMPH NODES, NEGATIVE FOR METASTATIC CARCINOMA (0/13). - MULTIPLE DIVERTICULA. - RESECTION MARGINS, NEGATIVE FOR ATYPIA OR MALIGNANCY. - PLEASE SEE ONCOLOGY TEMPLATE FOR DETAILS. 2. Colon, resection margin (donut), sigmoid - BENIGN COLONIC MUCOSA, NO EVIDENCE OF DYSPLASIA OR MALIGNANCY. Specimen: Left colon. Procedure: Segmental resection. Tumor site: Sigmoid colon. Specimen integrity: Intact. Macroscopic intactness of mesorectum: N/A Macroscopic tumor perforation: No Invasive tumor: Invasive adenocarcinoma with extracellular mucin. Maximum size: 8.0 cm, gross measurement. Histologic type(s): Invasive adenocarcinoma with extracellular mucin. Histologic grade and  differentiation: G1: well differentiated/low grade Type of polyp in which invasive carcinoma arose: Villous adenoma. Microscopic extension of invasive tumor: Invading through the muscularis propria into pericolonic fatty tissue. Lymph-Vascular invasion: Not identified. Peri-neural invasion: Not identified. Tumor deposit(s) (discontinuous extramural extension): None. Resection margins: Negative. Proximal margin: 9.6 cm. Distal margin: 9.6 cm. Mesenteric margin (sigmoid and transverse): 6 cm. Treatment effect (neo-adjuvant therapy): No Additional polyp(s): N/A Non-neoplastic findings: Prominent Crohn-like reaction around the tumor. Lymph nodes: number examined 13; number positive: 0 Pathologic Staging: pT3, pN0, pMX  Diagnosis 12/08/2014  Colon, segmental resection for tumor, right colon MODERATELY DIFFERENTIATED ADENOCARCINOMA OF THE COLON (3.8 CM) THE TUMOR PENETRATES TO THE SURFACE OF THE VISCERAL PERITONEUM (T4A) LYMPHOVASCULAR INVASION IS IDENTIFIED MARGINS OF RESECTION ARE NEGATIVE FOR TUMOR NINTEEN BENIGN LYMPH NODE (0/19) ONE TUMOR DEPOSITE IS IDENTIFIED (N1NobletonSpecimen: Colon Procedure: Segmental resection Tumor site: Ascending colon Specimen integrity: Intact Macroscopic intactness of mesorectum: Not applicable: Macroscopic tumor perforation: The tumor invades through the muscularis propria penatrate the surface of serosa Invasive tumor: Maximum size: 3.8 Histologic type(s): Adenocarcinoma G2 G2: moderately differentiated/low grade Type of polyp in which invasive carcinoma arose: Tubular adenoma Microscopic extension of invasive tumor: Serosa Lymph-Vascular invasion: Present Peri-neural invasion:  Negative Tumor deposit(s) (discontinuous extramural extension): Present Resection margins: Proximal margin: 12 cm Distal margin: 23 cm Circumferential (radial) (posterior ascending, posterior descending; lateral and posterior mid-rectum; and entire lower 1/3 rectum):3.5  cm Mesenteric margin (sigmoid and transverse): NA Distance closest margin (if all above margins negative): _ Treatment effect (neo-adjuvant therapy): Negative Additional polyp(s): Negative Non-neoplastic findings: Unremarkabel Lymph nodes: number examined 19; number positive: 0 Pathologic Staging: T4a, N1c, M_  MMR: NORMAL MSI: STABLE  Mismatch Repair (MMR) Protein Immunohistochemistry (IHC) IHC Expression Result: MLH1: Preserved nuclear expression (greater 50% tumor expression) MSH2: Preserved nuclear expression (greater 50% tumor expression) MSH6: Preserved nuclear expression (greater 50% tumor expression) PMS2: Preserved nuclear expression (greater 50% tumor expression) * Internal control demonstrates intact nuclear expression Interpretation: NORMAL   RADIOGRAPHIC STUDIES: I have personally reviewed the radiological images as listed and agreed with the findings in the report.  CT chest, abdomen and pelvis with contrast 09/16/2014 IMPRESSION: Annular constricting masslike soft tissue density involving the cecum measuring 2.3 x 3.3 cm, highly suspicious for metachronous primary colon carcinoma. Colonoscopy is recommended for further evaluation.  Increased size of 1.5 cm right pericolonic soft tissue nodule or lymph node, consistent with metastatic disease. Small less than 1 cm right lower quadrant mesenteric lymph nodes remain stable; local metastatic disease cannot be excluded.  No liver metastases identified.  Two new ill-defined pulmonary nodules in right upper lobe and left lower lobe, suspicious for pulmonary metastases.  Stable incidental findings including 4.0 cm ascending thoracic aortic aneurysm, left thyroid lobe nodule, and cholelithiasis  CT chest 12/29/2014  IMPRESSION: 1. Interval stability of previously described right upper lobe 1.1 cm pulmonary nodule, which has been stable since 09/19/2012, suggesting a benign etiology. 2. Interval decreased  size of previously described left lower lobe pulmonary nodule, suggesting a resolving inflammatory lesion. 3. No new significant pulmonary nodules. No evidence of metastatic disease in the chest. 4. Stable multinodular goiter, tiny hiatal hernia and cholelithiasis .  ASSESSMENT & PLAN:  82 year old Caucasian female, with past medical history of colon diverticulosis and arthritis, but otherwise healthy and active, who was found to have a large sigmoid tumor, status post sigmoidectomy.  1. Sigmoid colon adenocarcinoma, pT3N0M0, stage II, G1, MSI stable, ascending colon adenocarcinoma, pT4aN1cM0, stage IIIB, MSI stable, G2 -She had multifocal (2) colon cancer status post complete surgical resection,  -I reviewed her recent surgical pathology results in great details with her. The right colon cancer has several high-risk features, including stage IIIB, T4a lesion, tumor deposits, lymphovascular invasion, her risk of colon cancer recurrence is high.  - we discussed that the standard care for stage III colon cancer is adjuvant chemotherapy with FOLFOX. Given her advanced age, I do not think she is a good candidate for FOLFOX. She does have preserved functional status and limited comorbidities, I think she would be a candidate for single agent 5-FU or Xeloda.  -She did not tolerate Xeloda well, with severe diarrhea, anorexia and weight loss. She has recovered well. He is willing to try reduced dose of Xeloda. Will start 1054m q12hrs in a few days.   2. Diarrhea and anorexia  -I reviewed the management I diarrhea with Hurricaine, she knows to use Imodium and Lomotil as needed.  3.Lung nodules  -She has two small right lung nodules which were discovered on the CT scan in 09/2012,  RUL nodule slightly bigger on recent CT -She has been follow-up with Dr. BCyndia Bent  -she never smoked, low risk for lung cancer -Repeat his CT chest  on 12/29/2014 showed stable right upper lobe pulmonary nodule, and interval  decreased size of left lower lobe nodule. No other new lesions. -given the stability of the RUL nodule over 2 years, this is likely a benign lesion   Plan -lab reviewed with her, which are normal  -start cycle 2 Xeloda with dose reduction to 1064m bid in a few days -I will see her back in one week for follow up    All questions were answered. The patient knows to call the clinic with any problems, questions or concerns.  I spent 20 minutes counseling the patient face to face. The total time spent in the appointment was 25  minutes and more than 50% was on counseling.     FTruitt Merle MD 02/21/2015 5:33 PM

## 2015-02-21 NOTE — Telephone Encounter (Signed)
Per staff message and POF I have scheduled appts. Advised scheduler of appts. JMW  

## 2015-02-27 ENCOUNTER — Telehealth: Payer: Self-pay | Admitting: Hematology

## 2015-02-27 NOTE — Telephone Encounter (Signed)
Due to PAL moved 1/17 f/u CB due to patient may need IVF's. Spoke with patient re change and new time.

## 2015-03-01 ENCOUNTER — Other Ambulatory Visit: Payer: Self-pay

## 2015-03-01 ENCOUNTER — Ambulatory Visit: Payer: Self-pay

## 2015-03-01 ENCOUNTER — Ambulatory Visit: Payer: Self-pay | Admitting: Nurse Practitioner

## 2015-03-01 ENCOUNTER — Telehealth: Payer: Self-pay | Admitting: Hematology

## 2015-03-01 NOTE — Telephone Encounter (Signed)
pt cld to CX appt-stated has to take spouse to ED-stated she will call back to r/s

## 2015-03-04 ENCOUNTER — Telehealth: Payer: Self-pay | Admitting: *Deleted

## 2015-03-04 ENCOUNTER — Other Ambulatory Visit: Payer: Self-pay | Admitting: *Deleted

## 2015-03-04 NOTE — Telephone Encounter (Signed)
Talked with pt & she is on xeloda & started 02/22/15 & & will finish 14 days 1/23 & be on 7 day break.  Discussed with Dr Burr Medico & pt needs to come in week of 03/07/15.  POF sent to schedulers.

## 2015-03-04 NOTE — Telephone Encounter (Signed)
-----   Message from Truitt Merle, MD sent at 03/04/2015 10:43 AM EST ----- Regarding: RE: RESCHEDULING 1/17 APPTS Kham Zuckerman,  I called her and left a message for her to call back. It depends if she is on capecitabine, how she is doing with chemo pill, and when she will finish this cycle, then we will decide when she needs to come back. If she is doing well with the low dose chemo, I can see her 2-5 days before her next cycle (she is on chemo 2 weeks and one week off)  Thanks  Krista Blue  ----- Message -----    From: Theodosia Quay    Sent: 03/03/2015   6:09 PM      To: Jesse Fall, RN, Theodosia Quay, # Subject: RESCHEDULING 1/17 APPTS                        Dr. Burr Medico,  I spoke with Ms. Esquivias today re rescheduling her 1/17 appointment (lab/CB/fluids). She did not want to come tomorrow, Cyndee is out next week and you are full until Wednesday. She has an existing appointment for lab/fu with you on 2/7. She wants to know if she really needs to reschedule or can she just come in on 2/7? Please advise.   Thanks, [employer]

## 2015-03-05 ENCOUNTER — Telehealth: Payer: Self-pay | Admitting: Hematology

## 2015-03-05 NOTE — Telephone Encounter (Signed)
spoke with patient and she is aware of her new appt 1/23

## 2015-03-07 ENCOUNTER — Other Ambulatory Visit (HOSPITAL_BASED_OUTPATIENT_CLINIC_OR_DEPARTMENT_OTHER): Payer: PPO

## 2015-03-07 ENCOUNTER — Ambulatory Visit (HOSPITAL_BASED_OUTPATIENT_CLINIC_OR_DEPARTMENT_OTHER): Payer: PPO | Admitting: Hematology

## 2015-03-07 ENCOUNTER — Encounter: Payer: Self-pay | Admitting: Hematology

## 2015-03-07 ENCOUNTER — Telehealth: Payer: Self-pay | Admitting: Hematology

## 2015-03-07 VITALS — BP 159/80 | HR 71 | Temp 97.3°F | Resp 18 | Ht 63.0 in | Wt 141.5 lb

## 2015-03-07 DIAGNOSIS — G620 Drug-induced polyneuropathy: Secondary | ICD-10-CM

## 2015-03-07 DIAGNOSIS — C189 Malignant neoplasm of colon, unspecified: Secondary | ICD-10-CM

## 2015-03-07 DIAGNOSIS — C187 Malignant neoplasm of sigmoid colon: Secondary | ICD-10-CM

## 2015-03-07 DIAGNOSIS — R197 Diarrhea, unspecified: Secondary | ICD-10-CM | POA: Diagnosis not present

## 2015-03-07 DIAGNOSIS — C182 Malignant neoplasm of ascending colon: Secondary | ICD-10-CM

## 2015-03-07 DIAGNOSIS — C186 Malignant neoplasm of descending colon: Secondary | ICD-10-CM

## 2015-03-07 DIAGNOSIS — R918 Other nonspecific abnormal finding of lung field: Secondary | ICD-10-CM

## 2015-03-07 LAB — CBC & DIFF AND RETIC
BASO%: 1 % (ref 0.0–2.0)
Basophils Absolute: 0.1 10*3/uL (ref 0.0–0.1)
EOS%: 3.1 % (ref 0.0–7.0)
Eosinophils Absolute: 0.2 10*3/uL (ref 0.0–0.5)
HCT: 36.2 % (ref 34.8–46.6)
HGB: 12.1 g/dL (ref 11.6–15.9)
IMMATURE RETIC FRACT: 8.9 % (ref 1.60–10.00)
LYMPH#: 0.8 10*3/uL — AB (ref 0.9–3.3)
LYMPH%: 14.9 % (ref 14.0–49.7)
MCH: 30.8 pg (ref 25.1–34.0)
MCHC: 33.4 g/dL (ref 31.5–36.0)
MCV: 92.1 fL (ref 79.5–101.0)
MONO#: 0.5 10*3/uL (ref 0.1–0.9)
MONO%: 10.3 % (ref 0.0–14.0)
NEUT%: 70.7 % (ref 38.4–76.8)
NEUTROS ABS: 3.7 10*3/uL (ref 1.5–6.5)
Platelets: 164 10*3/uL (ref 145–400)
RBC: 3.93 10*6/uL (ref 3.70–5.45)
RDW: 18 % — AB (ref 11.2–14.5)
RETIC CT ABS: 34.58 10*3/uL (ref 33.70–90.70)
Retic %: 0.88 % (ref 0.70–2.10)
WBC: 5.2 10*3/uL (ref 3.9–10.3)

## 2015-03-07 LAB — COMPREHENSIVE METABOLIC PANEL
ALT: 12 U/L (ref 0–55)
AST: 21 U/L (ref 5–34)
Albumin: 3.8 g/dL (ref 3.5–5.0)
Alkaline Phosphatase: 82 U/L (ref 40–150)
Anion Gap: 8 mEq/L (ref 3–11)
BILIRUBIN TOTAL: 0.93 mg/dL (ref 0.20–1.20)
BUN: 14.7 mg/dL (ref 7.0–26.0)
CHLORIDE: 107 meq/L (ref 98–109)
CO2: 29 meq/L (ref 22–29)
CREATININE: 1.1 mg/dL (ref 0.6–1.1)
Calcium: 9.1 mg/dL (ref 8.4–10.4)
EGFR: 50 mL/min/{1.73_m2} — AB (ref >90)
GLUCOSE: 113 mg/dL (ref 70–140)
Potassium: 3.8 mEq/L (ref 3.5–5.1)
SODIUM: 144 meq/L (ref 136–145)
TOTAL PROTEIN: 6.9 g/dL (ref 6.4–8.3)

## 2015-03-07 MED ORDER — CAPECITABINE 500 MG PO TABS: 1000.0000 mg | ORAL_TABLET | Freq: Two times a day (BID) | ORAL | Status: DC

## 2015-03-07 MED FILL — CAPECITABINE 500 MG TABLET: 500 | 21 days supply | Qty: 56 | Fill #0

## 2015-03-07 NOTE — Progress Notes (Signed)
- [place]  Telephone:([phone] Fax:([phone]  Clinic Follow Up Note   Patient Care Team: Prince Solian, MD as PCP - General (Internal Medicine) Gaye Pollack, MD as Consulting Physician (Cardiothoracic Surgery) Gatha Mayer, MD as Consulting Physician (Gastroenterology) Leighton Ruff, MD as Consulting Physician (General Surgery) Tania Ade, RN as Registered Nurse (Medical Oncology) 03/07/2015  CHIEF COMPLAINTS:  Follow-up of colon cancer  Oncology History   Cancer of left colon [facility])   Staging form: Colon and Rectum, AJCC 7th Edition     Pathologic stage from 05/17/2014: Stage IIA (T3, N0, cM0) - Signed by Truitt Merle, MD on 02/21/2015 Cancer of right colon [facility])   Staging form: Colon and Rectum, AJCC 7th Edition     Pathologic stage from 12/08/2014: Stage IIIB (T4a, N1a, cM0) - Signed by Truitt Merle, MD on 02/21/2015       Cancer of right colon ([place])   04/09/2014 Procedure Colonoscopy per Dr. Silvano Rusk: Large fleshy mass sigmoid colon 25 cm from anus. Could not pass through.   04/09/2014 Tumor Marker CEA =11.7   04/12/2014 Imaging CT C/A/P:sigmoid colon mass;several small areas of soft tissue nodularity in peritoneal cavity; small attenuation structure posterior right hepatic lobe; 2 stable nodules in lungs dating back to 2014. No acute findings.   05/17/2014 Initial Diagnosis Colon cancer-presented with heme + stool and more frequent BMs   05/17/2014 Surgery Robot assisted sigmoidectomy   05/17/2014 Pathologic Stage pT3,pN0,pMX--Grade 1--0/13 nodes +--MMR normal   05/17/2014 Clinical Stage Stage II   12/08/2014 Surgery right hemicolectomy    12/08/2014 Pathology Results right colon adenocarcinoma, pT4aN1c, 19 nodes were negative, LVI(+), MMR normal    01/17/2015 -  Chemotherapy adjuvant capecitabine 1535m q12h, 2 weeks on and 1 week off. Second cycle was postponed to 02/22/2015 due to side effects and dose reduced to 1005mq12h/    HISTORY OF PRESENTING  ILLNESS:  Heidi [id number].o. female is here because of recently diagnosed a stage II sigmoid colon cancer.  She had annual physical with her PCP in early Feb this year, and stool OB (+), no overt GI bleeding, CBC was normal. She had noticed some loose BM before that, but no nausea, loss of appetite, weight loss or other symoptoms. Colonoscopy showed a large mass in the sigmoid colon 25 cm from anus. Biopsy was taken which showed tubulovillous adenoma, no malignancy. She was referred to surgeon Dr. ThMarcello Mooresnd underwent robotic-assisted sigmoidectomy on 4/4. She tolerated the procedure well, and was discharged home afterwards.  She has recovered well from the surgery 3 weeks ago, she has mild fatigue, but otherwise doing very well without other complains. She had 2-3 episodes of bleeding after the surgery, which was related to her herromods. She is eating well. 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   She has chronic knee pain she takes ibuprofen once daily.    CURRENT THERAPY: adjuvant chemotherapy capecitabine 150036m12h, day 1-14, every 21 days, started on 01/17/2015, second cycle was postponed to 1/10 due to side effects and dose reduced to 1000m31m2h   INTERIM HISTORY: Ms WebsMonrroyurns for follow-up. She has started second cycle lower dose capecitabine  About 2 weeks ago, and she has tolerating tolerating much better, she is going to finish in 2 days,  She hasmild numbness on fingers and and sensitvive moouth in the  past few days, had mild dirrhea and she took imudiun 3  tidmes  In the past 2 weeks. Her appetite is great, no nausea. She overall tolerated well. No other new complaints.   MEDICAL HISTORY:  Past Medical History  Diagnosis Date  . Diverticulosis of colon     severe in sigmoid but diffusley throughout colon  on 2009 colonoscopy  . Internal hemorrhoids   . Hematochezia 2009  . Benign esophageal stricture 04/2007    EGD  with dilatation.  Marland Kitchen GERD (gastroesophageal reflux disease)   . HH (hiatus hernia)   . Macular degeneration   . IBS (irritable bowel syndrome)   . Epistaxis ~ 2005    required cauterization twice.   . Reflux esophagitis   . Allergy   . Cataract     ight eye starting  . Arthritis     knees  . History of skin cancer   . Colon cancer ([place])      COLON CANCER / HX SKIN CANCER    SURGICAL HISTORY: Past Surgical History  Procedure Laterality Date  . Abdominal hysterectomy      partial with the bladder tack  . Bladder tack    . Retinal laser surgery    . Basal cell resection  08/2011, 11/2011    initially biopsy 08/3011.  repeat excision right nose with grafting from the skin behind the right ear  . Colonoscopy    . Esophagogastroduodenoscopy    . Upper gastrointestinal endoscopy    . Inguinal hernia repair  early 1990s    right  . Colonoscopy with propofol N/A 09/30/2014    Procedure: COLONOSCOPY WITH PROPOFOL;  Surgeon: Milus Banister, MD;  Location: [facility] END[facility];  Service: Endoscopy;  Laterality: N/A;  . Colon surgery  APRIL 2016    SIGMOIDECTOMY    SOCIAL HISTORY: Social History   Social History  . Marital Status: Married    Spouse Name: N/A  . Number of Children: N/A  . Years of Education: N/A   Occupational History  . Not on file.   Social History Main Topics  . Smoking status: Passive Smoke Exposure - Never Smoker  . Smokeless tobacco: Never Used  . Alcohol Use: No  . Drug Use: No  . Sexual Activity: Not on file   Other Topics Concern  . Not on file   Social History Narrative   Married, husband is disabled (physical and dementia) and she is caregiver   Has #1 child (daughter) living. Son died of MI at age 50   Worked at [employer] before she retired   Has #3 cats    FAMILY HISTORY: Family History  Problem Relation Age of Onset  . Colon cancer Paternal Grandfather 22    colon cancer   . Esophageal cancer Neg Hx   . Rectal cancer Neg Hx   .  Stomach cancer Neg Hx   . Stroke Father   . Heart attack Son 42    died of heart attack     ALLERGIES:  is allergic to penicillins; sulfonamide derivatives; and amoxicillin-pot clavulanate.  MEDICATIONS:  Current Outpatient Prescriptions  Medication Sig Dispense Refill  . alendronate (FOSAMAX) 70 MG tablet Take 70 mg by mouth every 7 (seven) days. Tuesdays. Take with a full glass of water on an empty stomach.    . Ascorbic Acid (VITAMIN C) 1000 MG tablet Take 1,000 mg by mouth daily.    . Biotin 1000 MCG tablet Take 1,000 mcg by mouth daily.     Marland Kitchen CALCIUM & MAGNESIUM CARBONATES PO Take 1 tablet  by mouth daily.    . capecitabine (XELODA) 500 MG tablet Take 2 tablets (1,000 mg total) by mouth 2 (two) times daily after a meal. Take for 14 days then off for 7 days 56 tablet 1  . cholecalciferol (VITAMIN D) 1000 UNITS tablet Take 1,000 Units by mouth daily.    . diphenoxylate-atropine (LOMOTIL) 2.5-0.025 MG tablet Take 2 tablets by mouth 4 (four) times daily as needed for diarrhea or loose stools. 30 tablet 0  . glucosamine-chondroitin 500-400 MG tablet Take 1 tablet by mouth daily.     . Homeopathic Products ([employer] LEG CRAMPS PAIN RELIEF PO) Take 1 tablet by mouth daily as needed (for leg cramps).    Marland Kitchen HYDROcodone-acetaminophen (NORCO/VICODIN) 5-325 MG tablet Take 1-2 tablets by mouth every 6 (six) hours as needed. 30 tablet 0  . ibuprofen (ADVIL,MOTRIN) 200 MG tablet Take 200 mg by mouth daily as needed for moderate pain.     Marland Kitchen loratadine (CLARITIN) 10 MG tablet Take 10 mg by mouth daily.    . magic mouthwash w/lidocaine SOLN Take 5 mLs by mouth 4 (four) times daily as needed for mouth pain. 240 mL 1  . Multiple Vitamin (MULTIVITAMIN) tablet Take 1 tablet by mouth daily.    . vitamin B-12 (CYANOCOBALAMIN) 1000 MCG tablet Take 1,000 mcg by mouth daily.      No current facility-administered medications for this visit.    REVIEW OF SYSTEMS:   Constitutional: Denies fevers, chills or abnormal  night sweats Eyes: Denies blurriness of vision, double vision or watery eyes Ears, nose, mouth, throat, and face: Denies mucositis or sore throat Respiratory: Denies cough, dyspnea or wheezes Cardiovascular: Denies palpitation, chest discomfort or lower extremity swelling Gastrointestinal:  Denies nausea, heartburn or change in bowel habits Skin: Denies abnormal skin rashes Lymphatics: Denies new lymphadenopathy or easy bruising Neurological:Denies numbness, tingling or new weaknesses Behavioral/Psych: Mood is stable, no new changes  All other systems were reviewed with the patient and are negative.  PHYSICAL EXAMINATION: ECOG PERFORMANCE STATUS: 1  Filed Vitals:   03/07/15 1157  BP: 159/80  Pulse: 71  Temp: 97.3 F (36.3 C)  Resp: 18   Filed Weights   03/07/15 1157  Weight: 141 lb 8 oz (64.184 kg)    GENERAL:alert, no distress and comfortable SKIN: skin color, texture, turgor are normal, no rashes or significant lesions EYES: normal, conjunctiva are pink and non-injected, sclera clear OROPHARYNX:no exudate, no erythema and lips, buccal mucosa, and tongue normal  NECK: supple, thyroid normal size, non-tender, without nodularity LYMPH:  no palpable lymphadenopathy in the cervical, axillary or inguinal LUNGS: clear to auscultation and percussion with normal breathing effort HEART: regular rate & rhythm and no murmurs and no lower extremity edema ABDOMEN:abdomen soft, surgical incision and to the low abdomen is well healed. non-tender and normal bowel sounds Musculoskeletal:no cyanosis of digits and no clubbing  PSYCH: alert & oriented x 3 with fluent speech NEURO: no focal motor/sensory deficits  LABORATORY DATA:  I have reviewed the data as listed CBC Latest Ref Rng 03/07/2015 02/21/2015 02/03/2015  WBC 3.9 - 10.3 10e3/uL 5.2 7.1 7.2  Hemoglobin 11.6 - 15.9 g/dL 12.1 12.2 12.3  Hematocrit 34.8 - 46.6 % 36.2 37.5 38.0  Platelets 145 - 400 10e3/uL 164 149 186     Recent  Labs  12/09/14 0452 12/10/14 0537 12/11/14 0601  02/03/15 0832 02/21/15 0859 03/07/15 1117  NA 138 139 140  < > 139 141 144  K 3.5 3.8 4.3  < >  3.8 4.4 3.8  CL 105 105 106  --   --   --   --   CO2 _0 < > _1 GLUCOSE 112* 113* 99  < > 115 91 113  BUN _2 < > 15.4 14.2 14.7  CREATININE 0.91 0.72 0.85  < > 1.1 1.0 1.1  CALCIUM 7.6* 8.3* 8.4*  < > 9.4 9.4 9.1  GFRNONAA 58* >60 >60  --   --   --   --   GFRAA >60 >60 >60  --   --   --   --   PROT  --   --   --   < > 7.0 6.7 6.9  ALBUMIN  --   --   --   < > 3.6 3.7 3.8  AST  --   --   --   < > 17 32 21  ALT  --   --   --   < > <_3 ALKPHOS  --   --   --   < > 77 93 82  BILITOT  --   --   --   < > 1.08 1.08 0.93  < > = values in this interval not displayed.  CEA  Status: Finalresult Visible to patient:  Not Released Nextappt: 03/01/2015 at 10:30 AM in Oncology [facility] 4) Dx:  Malignant neoplasm of ascending colon...           Ref Range 2wk ago  3wk ago  67moago  552mogo     CEA 0.0 - 5.0 ng/mL 1.4 1.6 2.3 29.3 (H)         Pathology report: Diagnosis 05/17/2014 1. Colon, segmental resection for tumor, sigmoid, open end proximal - INVASIVE ADENOCARCINOMA WITH EXTRACELLULAR MUCIN, INVADING THROUGH THE MUSCULARIS PROPRIA INTO PERICOLONIC FATTY TISSUE. - THIRTEEN LYMPH NODES, NEGATIVE FOR METASTATIC CARCINOMA (0/13). - MULTIPLE DIVERTICULA. - RESECTION MARGINS, NEGATIVE FOR ATYPIA OR MALIGNANCY. - PLEASE SEE ONCOLOGY TEMPLATE FOR DETAILS. 2. Colon, resection margin (donut), sigmoid - BENIGN COLONIC MUCOSA, NO EVIDENCE OF DYSPLASIA OR MALIGNANCY. Specimen: Left colon. Procedure: Segmental resection. Tumor site: Sigmoid colon. Specimen integrity: Intact. Macroscopic intactness of mesorectum: N/A Macroscopic tumor perforation: No Invasive tumor: Invasive adenocarcinoma with extracellular mucin. Maximum size: 8.0 cm, gross measurement. Histologic type(s): Invasive  adenocarcinoma with extracellular mucin. Histologic grade and differentiation: G1: well differentiated/low grade Type of polyp in which invasive carcinoma arose: Villous adenoma. Microscopic extension of invasive tumor: Invading through the muscularis propria into pericolonic fatty tissue. Lymph-Vascular invasion: Not identified. Peri-neural invasion: Not identified. Tumor deposit(s) (discontinuous extramural extension): None. Resection margins: Negative. Proximal margin: 9.6 cm. Distal margin: 9.6 cm. Mesenteric margin (sigmoid and transverse): 6 cm. Treatment effect (neo-adjuvant therapy): No Additional polyp(s): N/A Non-neoplastic findings: Prominent Crohn-like reaction around the tumor. Lymph nodes: number examined 13; number positive: 0 Pathologic Staging: pT3, pN0, pMX  Diagnosis 12/08/2014  Colon, segmental resection for tumor, right colon MODERATELY DIFFERENTIATED ADENOCARCINOMA OF THE COLON (3.8 CM) THE TUMOR PENETRATES TO THE SURFACE OF THE VISCERAL PERITONEUM (T4A) LYMPHOVASCULAR INVASION IS IDENTIFIED MARGINS OF RESECTION ARE NEGATIVE FOR TUMOR NINTEEN BENIGN LYMPH NODE (0/19) ONE TUMOR DEPOSITE IS IDENTIFIED ([place] CitySpecimen: Colon Procedure: Segmental resection Tumor site: Ascending colon Specimen integrity: Intact Macroscopic intactness of mesorectum: Not applicable: Macroscopic tumor perforation: The tumor invades through the muscularis propria penatrate the surface of serosa Invasive tumor: Maximum size: 3.8 Histologic type(s): Adenocarcinoma G2 G2: moderately differentiated/low grade  Type of polyp in which invasive carcinoma arose: Tubular adenoma Microscopic extension of invasive tumor: Serosa Lymph-Vascular invasion: Present Peri-neural invasion: Negative Tumor deposit(s) (discontinuous extramural extension): Present Resection margins: Proximal margin: 12 cm Distal margin: 23 cm Circumferential (radial) (posterior ascending, posterior descending;  lateral and posterior mid-rectum; and entire lower 1/3 rectum):3.5 cm Mesenteric margin (sigmoid and transverse): NA Distance closest margin (if all above margins negative): _ Treatment effect (neo-adjuvant therapy): Negative Additional polyp(s): Negative Non-neoplastic findings: Unremarkabel Lymph nodes: number examined 19; number positive: 0 Pathologic Staging: T4a, N1c, M_  MMR: NORMAL MSI: STABLE  Mismatch Repair (MMR) Protein Immunohistochemistry (IHC) IHC Expression Result: MLH1: Preserved nuclear expression (greater 50% tumor expression) MSH2: Preserved nuclear expression (greater 50% tumor expression) MSH6: Preserved nuclear expression (greater 50% tumor expression) PMS2: Preserved nuclear expression (greater 50% tumor expression) * Internal control demonstrates intact nuclear expression Interpretation: NORMAL   RADIOGRAPHIC STUDIES: I have personally reviewed the radiological images as listed and agreed with the findings in the report.  CT chest, abdomen and pelvis with contrast 09/16/2014 IMPRESSION: Annular constricting masslike soft tissue density involving the cecum measuring 2.3 x 3.3 cm, highly suspicious for metachronous primary colon carcinoma. Colonoscopy is recommended for further evaluation.  Increased size of 1.5 cm right pericolonic soft tissue nodule or lymph node, consistent with metastatic disease. Small less than 1 cm right lower quadrant mesenteric lymph nodes remain stable; local metastatic disease cannot be excluded.  No liver metastases identified.  Two new ill-defined pulmonary nodules in right upper lobe and left lower lobe, suspicious for pulmonary metastases.  Stable incidental findings including 4.0 cm ascending thoracic aortic aneurysm, left thyroid lobe nodule, and cholelithiasis  CT chest 12/29/2014  IMPRESSION: 1. Interval stability of previously described right upper lobe 1.1 cm pulmonary nodule, which has been stable since  09/19/2012, suggesting a benign etiology. 2. Interval decreased size of previously described left lower lobe pulmonary nodule, suggesting a resolving inflammatory lesion. 3. No new significant pulmonary nodules. No evidence of metastatic disease in the chest. 4. Stable multinodular goiter, tiny hiatal hernia and cholelithiasis .  ASSESSMENT & PLAN:  82 year old Caucasian female, with past medical history of colon diverticulosis and arthritis, but otherwise healthy and active, who was found to have a large sigmoid tumor, status post sigmoidectomy.  1. Sigmoid colon adenocarcinoma, pT3N0M0, stage II, G1, MSI stable, ascending colon adenocarcinoma, pT4aN1cM0, stage IIIB, MSI stable, G2 -She had multifocal (2) colon cancer status post complete surgical resection,  -I reviewed her recent surgical pathology results in great details with her. The right colon cancer has several high-risk features, including stage IIIB, T4a lesion, tumor deposits, lymphovascular invasion, her risk of colon cancer recurrence is high.  - we discussed that the standard care for stage III colon cancer is adjuvant chemotherapy with FOLFOX. Given her advanced age, I do not think she is a good candidate for FOLFOX. She does have preserved functional status and limited comorbidities, I think she would be a candidate for single agent 5-FU or Xeloda.  -She did not tolerate Xeloda well, with severe diarrhea, anorexia and weight loss.  We decreased her dose on second cycle, and she has been tolerating well.  We'll continue.  2. Diarrhea and anorexia  -I reviewed the management I diarrhea, she knows to use Imodium and Lomotil as needed.  3.  Peripheral neuropathy, G 1 - noticed from second cycle of Xeloda,  We'll monitor closely.  3.Lung nodules  -She has two small right lung nodules which were discovered on the  CT scan in 09/2012,  RUL nodule slightly bigger on recent CT -She has been follow-up with Dr. Cyndia Bent.  -she never  smoked, low risk for lung cancer -Repeat his CT chest on 12/29/2014 showed stable right upper lobe pulmonary nodule, and interval decreased size of left lower lobe nodule. No other new lesions. -given the stability of the RUL nodule over 2 years, this is likely a benign lesion   Plan -lab reviewed with her, which are normal  -start cycle 3 Xeloda on 2/1 - I'll see her back with flap on February 21st   All questions were answered. The patient knows to call the clinic with any problems, questions or concerns.  I spent 15 minutes counseling the patient face to face. The total time spent in the appointment was 20  minutes and more than 50% was on counseling.     Truitt Merle, MD 03/07/2015 1:21 PM

## 2015-03-07 NOTE — Telephone Encounter (Signed)
per pof to sch pt appt-gave pt copy of avs °

## 2015-03-22 ENCOUNTER — Other Ambulatory Visit: Payer: Self-pay

## 2015-03-22 ENCOUNTER — Ambulatory Visit: Payer: Self-pay | Admitting: Hematology

## 2015-03-28 DIAGNOSIS — N390 Urinary tract infection, site not specified: Secondary | ICD-10-CM | POA: Diagnosis not present

## 2015-03-28 DIAGNOSIS — R8299 Other abnormal findings in urine: Secondary | ICD-10-CM | POA: Diagnosis not present

## 2015-04-03 ENCOUNTER — Telehealth: Payer: Self-pay | Admitting: Hematology

## 2015-04-03 NOTE — Telephone Encounter (Signed)
I tried to call pt but no answer. I spoke her sister-in-law, to see if she can change her appointment from 2/21 to 2/20, 11:15am lab and see me at 11:45am. I asked her to call back on Monday morning to confirm, please send a POF for her appointment change if she confirms it.  Truitt Merle  04/03/2015

## 2015-04-04 ENCOUNTER — Other Ambulatory Visit (HOSPITAL_BASED_OUTPATIENT_CLINIC_OR_DEPARTMENT_OTHER): Payer: PPO

## 2015-04-04 ENCOUNTER — Telehealth: Payer: Self-pay | Admitting: Hematology

## 2015-04-04 ENCOUNTER — Ambulatory Visit (HOSPITAL_BASED_OUTPATIENT_CLINIC_OR_DEPARTMENT_OTHER): Payer: PPO | Admitting: Hematology

## 2015-04-04 VITALS — BP 149/87 | HR 75 | Temp 98.2°F | Resp 18 | Ht 63.0 in | Wt 141.9 lb

## 2015-04-04 DIAGNOSIS — C187 Malignant neoplasm of sigmoid colon: Secondary | ICD-10-CM

## 2015-04-04 DIAGNOSIS — K521 Toxic gastroenteritis and colitis: Secondary | ICD-10-CM

## 2015-04-04 DIAGNOSIS — G8929 Other chronic pain: Secondary | ICD-10-CM

## 2015-04-04 DIAGNOSIS — R911 Solitary pulmonary nodule: Secondary | ICD-10-CM

## 2015-04-04 DIAGNOSIS — M25569 Pain in unspecified knee: Secondary | ICD-10-CM

## 2015-04-04 DIAGNOSIS — G622 Polyneuropathy due to other toxic agents: Secondary | ICD-10-CM

## 2015-04-04 DIAGNOSIS — R630 Anorexia: Secondary | ICD-10-CM

## 2015-04-04 DIAGNOSIS — R197 Diarrhea, unspecified: Secondary | ICD-10-CM

## 2015-04-04 DIAGNOSIS — C189 Malignant neoplasm of colon, unspecified: Secondary | ICD-10-CM

## 2015-04-04 DIAGNOSIS — C186 Malignant neoplasm of descending colon: Secondary | ICD-10-CM

## 2015-04-04 DIAGNOSIS — C182 Malignant neoplasm of ascending colon: Secondary | ICD-10-CM

## 2015-04-04 LAB — CBC & DIFF AND RETIC
BASO%: 0.6 % (ref 0.0–2.0)
BASOS ABS: 0.1 10*3/uL (ref 0.0–0.1)
EOS%: 4.7 % (ref 0.0–7.0)
Eosinophils Absolute: 0.4 10*3/uL (ref 0.0–0.5)
HEMATOCRIT: 37.5 % (ref 34.8–46.6)
HGB: 12.7 g/dL (ref 11.6–15.9)
Immature Retic Fract: 5.6 % (ref 1.60–10.00)
LYMPH%: 13.1 % — ABNORMAL LOW (ref 14.0–49.7)
MCH: 32.3 pg (ref 25.1–34.0)
MCHC: 33.9 g/dL (ref 31.5–36.0)
MCV: 95.4 fL (ref 79.5–101.0)
MONO#: 0.6 10*3/uL (ref 0.1–0.9)
MONO%: 7.5 % (ref 0.0–14.0)
NEUT#: 5.7 10*3/uL (ref 1.5–6.5)
NEUT%: 74.1 % (ref 38.4–76.8)
PLATELETS: 163 10*3/uL (ref 145–400)
RBC: 3.93 10*6/uL (ref 3.70–5.45)
RDW: 18.6 % — ABNORMAL HIGH (ref 11.2–14.5)
Retic %: 0.88 % (ref 0.70–2.10)
Retic Ct Abs: 34.58 10*3/uL (ref 33.70–90.70)
WBC: 7.7 10*3/uL (ref 3.9–10.3)
lymph#: 1 10*3/uL (ref 0.9–3.3)

## 2015-04-04 LAB — COMPREHENSIVE METABOLIC PANEL
ALK PHOS: 86 U/L (ref 40–150)
ALT: 12 U/L (ref 0–55)
ANION GAP: 8 meq/L (ref 3–11)
AST: 23 U/L (ref 5–34)
Albumin: 3.8 g/dL (ref 3.5–5.0)
BILIRUBIN TOTAL: 0.94 mg/dL (ref 0.20–1.20)
BUN: 19.2 mg/dL (ref 7.0–26.0)
CALCIUM: 9.8 mg/dL (ref 8.4–10.4)
CO2: 30 mEq/L — ABNORMAL HIGH (ref 22–29)
CREATININE: 1 mg/dL (ref 0.6–1.1)
Chloride: 107 mEq/L (ref 98–109)
EGFR: 56 mL/min/{1.73_m2} — AB (ref >90)
Glucose: 107 mg/dl (ref 70–140)
Potassium: 4 mEq/L (ref 3.5–5.1)
Sodium: 145 mEq/L (ref 136–145)
TOTAL PROTEIN: 7 g/dL (ref 6.4–8.3)

## 2015-04-04 NOTE — Progress Notes (Signed)
- [place]  [phone] [phone]  Clinic Follow Up Note   Patient Care Team: Prince Solian, MD as PCP - General (Internal Medicine) Gaye Pollack, MD as Consulting Physician (Cardiothoracic Surgery) Gatha Mayer, MD as Consulting Physician (Gastroenterology) Leighton Ruff, MD as Consulting Physician (General Surgery) Tania Ade, RN as Registered Nurse (Medical Oncology) 04/04/2015  CHIEF COMPLAINTS:  Follow-up of colon cancer  Oncology History   Cancer of left colon [facility])   Staging form: Colon and Rectum, AJCC 7th Edition     Pathologic stage from 05/17/2014: Stage IIA (T3, N0, cM0) - Signed by Truitt Merle, MD on 02/21/2015 Cancer of right colon [facility])   Staging form: Colon and Rectum, AJCC 7th Edition     Pathologic stage from 12/08/2014: Stage IIIB (T4a, N1a, cM0) - Signed by Truitt Merle, MD on 02/21/2015       Cancer of right colon ([place])   04/09/2014 Procedure Colonoscopy per Dr. Silvano Rusk: Large fleshy mass sigmoid colon 25 cm from anus. Could not pass through.   04/09/2014 Tumor Marker CEA =11.7   04/12/2014 Imaging CT C/A/P:sigmoid colon mass;several small areas of soft tissue nodularity in peritoneal cavity; small attenuation structure posterior right hepatic lobe; 2 stable nodules in lungs dating back to 2014. No acute findings.   05/17/2014 Initial Diagnosis Colon cancer-presented with heme + stool and more frequent BMs   05/17/2014 Surgery Robot assisted sigmoidectomy   05/17/2014 Pathologic Stage pT3,pN0,pMX--Grade 1--0/13 nodes +--MMR normal   05/17/2014 Clinical Stage Stage II   12/08/2014 Surgery right hemicolectomy    12/08/2014 Pathology Results right colon adenocarcinoma, pT4aN1c, 19 nodes were negative, LVI(+), MMR normal    01/17/2015 -  Chemotherapy adjuvant capecitabine 1522m q12h, 2 weeks on and 1 week off. Second cycle was postponed to 02/22/2015 due to side effects and dose reduced to 10030mq12h/    HISTORY OF PRESENTING  ILLNESS:  Heidi DICK[age] female is here because of recently diagnosed a stage II sigmoid colon cancer.  She had annual physical with her PCP in early Feb this year, and stool OB (+), no overt GI bleeding, CBC was normal. She had noticed some loose BM before that, but no nausea, loss of appetite, weight loss or other symoptoms. Colonoscopy showed a large mass in the sigmoid colon 25 cm from anus. Biopsy was taken which showed tubulovillous adenoma, no malignancy. She was referred to surgeon Dr. ThMarcello Mooresnd underwent robotic-assisted sigmoidectomy on 4/4. She tolerated the procedure well, and was discharged home afterwards.  She has recovered well from the surgery 3 weeks ago, she has mild fatigue, but otherwise doing very well without other complains. She had 2-3 episodes of bleeding after the surgery, which was related to her herromods. She is eating well. 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   She has chronic knee pain she takes ibuprofen once daily.    CURRENT THERAPY: adjuvant chemotherapy capecitabine 150028m12h, day 1-14, every 21 days, started on 01/17/2015, second cycle was postponed to 1/10 due to side effects and dose reduced to 1000m56m2h   INTERIM HISTORY: Ms [employer] for follow-up. She has completed cycle 3 Xeloda 2 days ago, she has a few episodes of diarrhea, and some constipation. She also has mild sensitivity on hands, and bottom of her feet. Her hand function is normal. Her gait is stable, but does have mild difficulty to get walking started, overall tolerating routine  physical activities without much difficulty. She has good appetite and eating well, her weight is stable. Her energy level is decent, she is doing well overall at home.   MEDICAL HISTORY:  Past Medical History  Diagnosis Date  . Diverticulosis of colon     severe in sigmoid but diffusley throughout colon  on 2009 colonoscopy  . Internal  hemorrhoids   . Hematochezia 2009  . Benign esophageal stricture 04/2007    EGD with dilatation.  Marland Kitchen GERD (gastroesophageal reflux disease)   . HH (hiatus hernia)   . Macular degeneration   . IBS (irritable bowel syndrome)   . Epistaxis ~ 2005    required cauterization twice.   . Reflux esophagitis   . Allergy   . Cataract     ight eye starting  . Arthritis     knees  . History of skin cancer   . Colon cancer ([place])      COLON CANCER / HX SKIN CANCER    SURGICAL HISTORY: Past Surgical History  Procedure Laterality Date  . Abdominal hysterectomy      partial with the bladder tack  . Bladder tack    . Retinal laser surgery    . Basal cell resection  08/2011, 11/2011    initially biopsy 08/3011.  repeat excision right nose with grafting from the skin behind the right ear  . Colonoscopy    . Esophagogastroduodenoscopy    . Upper gastrointestinal endoscopy    . Inguinal hernia repair  early 1990s    right  . Colonoscopy with propofol N/A 09/30/2014    Procedure: COLONOSCOPY WITH PROPOFOL;  Surgeon: Milus Banister, MD;  Location: [facility] ENDOSCOPY;  Service: Endoscopy;  Laterality: N/A;  . Colon surgery  APRIL 2016    SIGMOIDECTOMY    SOCIAL HISTORY: Social History   Social History  . Marital Status: Married    Spouse Name: N/A  . Number of Children: N/A  . Years of Education: N/A   Occupational History  . Not on file.   Social History Main Topics  . Smoking status: Passive Smoke Exposure - Never Smoker  . Smokeless tobacco: Never Used  . Alcohol Use: No  . Drug Use: No  . Sexual Activity: Not on file   Other Topics Concern  . Not on file   Social History Narrative   Married, husband is disabled (physical and dementia) and she is caregiver   Has #1 child (daughter) living. Son died of MI at age 51   Worked at [employer] before she retired   Has #3 cats    FAMILY HISTORY: Family History  Problem Relation Age of Onset  . Colon cancer Paternal Grandfather  34    colon cancer   . Esophageal cancer Neg Hx   . Rectal cancer Neg Hx   . Stomach cancer Neg Hx   . Stroke Father   . Heart attack Son 51    died of heart attack     ALLERGIES:  is allergic to penicillins; sulfonamide derivatives; and amoxicillin-pot clavulanate.  MEDICATIONS:  Current Outpatient Prescriptions  Medication Sig Dispense Refill  . alendronate (FOSAMAX) 70 MG tablet Take 70 mg by mouth every 7 (seven) days. Tuesdays. Take with a full glass of water on an empty stomach.    . Ascorbic Acid (VITAMIN C) 1000 MG tablet Take 1,000 mg by mouth daily.    . Biotin 1000 MCG tablet Take 1,000 mcg by mouth daily.     Marland Kitchen CALCIUM &  MAGNESIUM CARBONATES PO Take 1 tablet by mouth daily.    . capecitabine (XELODA) 500 MG tablet Take 2 tablets (1,000 mg total) by mouth 2 (two) times daily after a meal. Take for 14 days then off for 7 days 56 tablet 1  . cholecalciferol (VITAMIN D) 1000 UNITS tablet Take 1,000 Units by mouth daily.    . diphenoxylate-atropine (LOMOTIL) 2.5-0.025 MG tablet Take 2 tablets by mouth 4 (four) times daily as needed for diarrhea or loose stools. 30 tablet 0  . glucosamine-chondroitin 500-400 MG tablet Take 1 tablet by mouth daily.     . Homeopathic Products ([employer] LEG CRAMPS PAIN RELIEF PO) Take 1 tablet by mouth daily as needed (for leg cramps).    Marland Kitchen HYDROcodone-acetaminophen (NORCO/VICODIN) 5-325 MG tablet Take 1-2 tablets by mouth every 6 (six) hours as needed. 30 tablet 0  . ibuprofen (ADVIL,MOTRIN) 200 MG tablet Take 200 mg by mouth daily as needed for moderate pain.     Marland Kitchen loratadine (CLARITIN) 10 MG tablet Take 10 mg by mouth daily.    . magic mouthwash w/lidocaine SOLN Take 5 mLs by mouth 4 (four) times daily as needed for mouth pain. 240 mL 1  . Multiple Vitamin (MULTIVITAMIN) tablet Take 1 tablet by mouth daily.    . vitamin B-12 (CYANOCOBALAMIN) 1000 MCG tablet Take 1,000 mcg by mouth daily.      No current facility-administered medications for this  visit.    REVIEW OF SYSTEMS:   Constitutional: Denies fevers, chills or abnormal night sweats Eyes: Denies blurriness of vision, double vision or watery eyes Ears, nose, mouth, throat, and face: Denies mucositis or sore throat Respiratory: Denies cough, dyspnea or wheezes Cardiovascular: Denies palpitation, chest discomfort or lower extremity swelling Gastrointestinal:  Denies nausea, heartburn or change in bowel habits Skin: Denies abnormal skin rashes Lymphatics: Denies new lymphadenopathy or easy bruising Neurological:Denies numbness, tingling or new weaknesses Behavioral/Psych: Mood is stable, no new changes  All other systems were reviewed with the patient and are negative.  PHYSICAL EXAMINATION: ECOG PERFORMANCE STATUS: 1  Filed Vitals:   04/04/15 1151  BP: 149/87  Pulse: 75  Temp: 98.2 F (36.8 C)  Resp: 18   Filed Weights   04/04/15 1151  Weight: 141 lb 14.4 oz (64.365 kg)    GENERAL:alert, no distress and comfortable SKIN: skin color, texture, turgor are normal, no rashes or significant lesions EYES: normal, conjunctiva are pink and non-injected, sclera clear OROPHARYNX:no exudate, no erythema and lips, buccal mucosa, and tongue normal  NECK: supple, thyroid normal size, non-tender, without nodularity LYMPH:  no palpable lymphadenopathy in the cervical, axillary or inguinal LUNGS: clear to auscultation and percussion with normal breathing effort HEART: regular rate & rhythm and no murmurs and no lower extremity edema ABDOMEN:abdomen soft, surgical incision and to the low abdomen is well healed. non-tender and normal bowel sounds Musculoskeletal:no cyanosis of digits and no clubbing  PSYCH: alert & oriented x 3 with fluent speech NEURO: no focal motor/sensory deficits  LABORATORY DATA:  I have reviewed the data as listed CBC Latest Ref Rng 04/04/2015 03/07/2015 02/21/2015  WBC 3.9 - 10.3 10e3/uL 7.7 5.2 7.1  Hemoglobin 11.6 - 15.9 g/dL 12.7 12.1 12.2  Hematocrit  34.8 - 46.6 % 37.5 36.2 37.5  Platelets 145 - 400 10e3/uL 163 164 149     Recent Labs  12/09/14 0452 12/10/14 0537 12/11/14 0601  02/21/15 0859 03/07/15 1117 04/04/15 1125  NA 138 139 140  < > 141 144 145  K  3.5 3.8 4.3  < > 4.4 3.8 4.0  CL 105 105 106  --   --   --   --   CO2 '27 28 29  ' < > 28 29 30*  GLUCOSE 112* 113* 99  < > 91 113 107  BUN '14 7 12  ' < > 14.2 14.7 19.2  CREATININE 0.91 0.72 0.85  < > 1.0 1.1 1.0  CALCIUM 7.6* 8.3* 8.4*  < > 9.4 9.1 9.8  GFRNONAA 58* >60 >60  --   --   --   --   GFRAA >60 >60 >60  --   --   --   --   PROT  --   --   --   < > 6.7 6.9 7.0  ALBUMIN  --   --   --   < > 3.7 3.8 3.8  AST  --   --   --   < > 32 21 23  ALT  --   --   --   < > '21 12 12  ' ALKPHOS  --   --   --   < > 93 82 86  BILITOT  --   --   --   < > 1.08 0.93 0.94  < > = values in this interval not displayed.   Pathology report: Diagnosis 05/17/2014 1. Colon, segmental resection for tumor, sigmoid, open end proximal - INVASIVE ADENOCARCINOMA WITH EXTRACELLULAR MUCIN, INVADING THROUGH THE MUSCULARIS PROPRIA INTO PERICOLONIC FATTY TISSUE. - THIRTEEN LYMPH NODES, NEGATIVE FOR METASTATIC CARCINOMA (0/13). - MULTIPLE DIVERTICULA. - RESECTION MARGINS, NEGATIVE FOR ATYPIA OR MALIGNANCY. - PLEASE SEE ONCOLOGY TEMPLATE FOR DETAILS. 2. Colon, resection margin (donut), sigmoid - BENIGN COLONIC MUCOSA, NO EVIDENCE OF DYSPLASIA OR MALIGNANCY. Specimen: Left colon. Procedure: Segmental resection. Tumor site: Sigmoid colon. Specimen integrity: Intact. Macroscopic intactness of mesorectum: N/A Macroscopic tumor perforation: No Invasive tumor: Invasive adenocarcinoma with extracellular mucin. Maximum size: 8.0 cm, gross measurement. Histologic type(s): Invasive adenocarcinoma with extracellular mucin. Histologic grade and differentiation: G1: well differentiated/low grade Type of polyp in which invasive carcinoma arose: Villous adenoma. Microscopic extension of invasive tumor: Invading  through the muscularis propria into pericolonic fatty tissue. Lymph-Vascular invasion: Not identified. Peri-neural invasion: Not identified. Tumor deposit(s) (discontinuous extramural extension): None. Resection margins: Negative. Proximal margin: 9.6 cm. Distal margin: 9.6 cm. Mesenteric margin (sigmoid and transverse): 6 cm. Treatment effect (neo-adjuvant therapy): No Additional polyp(s): N/A Non-neoplastic findings: Prominent Crohn-like reaction around the tumor. Lymph nodes: number examined 13; number positive: 0 Pathologic Staging: pT3, pN0, pMX  Diagnosis 12/08/2014  Colon, segmental resection for tumor, right colon MODERATELY DIFFERENTIATED ADENOCARCINOMA OF THE COLON (3.8 CM) THE TUMOR PENETRATES TO THE SURFACE OF THE VISCERAL PERITONEUM (T4A) LYMPHOVASCULAR INVASION IS IDENTIFIED MARGINS OF RESECTION ARE NEGATIVE FOR TUMOR NINTEEN BENIGN LYMPH NODE (0/19) ONE TUMOR DEPOSITE IS IDENTIFIED (Frannie) Specimen: Colon Procedure: Segmental resection Tumor site: Ascending colon Specimen integrity: Intact Macroscopic intactness of mesorectum: Not applicable: Macroscopic tumor perforation: The tumor invades through the muscularis propria penatrate the surface of serosa Invasive tumor: Maximum size: 3.8 Histologic type(s): Adenocarcinoma G2 G2: moderately differentiated/low grade Type of polyp in which invasive carcinoma arose: Tubular adenoma Microscopic extension of invasive tumor: Serosa Lymph-Vascular invasion: Present Peri-neural invasion: Negative Tumor deposit(s) (discontinuous extramural extension): Present Resection margins: Proximal margin: 12 cm Distal margin: 23 cm Circumferential (radial) (posterior ascending, posterior descending; lateral and posterior mid-rectum; and entire lower 1/3 rectum):3.5 cm Mesenteric margin (sigmoid and transverse): NA Distance closest margin (if all  above margins negative): _ Treatment effect (neo-adjuvant therapy):  Negative Additional polyp(s): Negative Non-neoplastic findings: Unremarkabel Lymph nodes: number examined 19; number positive: 0 Pathologic Staging: T4a, N1c, M_  MMR: NORMAL MSI: STABLE  Mismatch Repair (MMR) Protein Immunohistochemistry (IHC) IHC Expression Result: MLH1: Preserved nuclear expression (greater 50% tumor expression) MSH2: Preserved nuclear expression (greater 50% tumor expression) MSH6: Preserved nuclear expression (greater 50% tumor expression) PMS2: Preserved nuclear expression (greater 50% tumor expression) * Internal control demonstrates intact nuclear expression Interpretation: NORMAL   RADIOGRAPHIC STUDIES: I have personally reviewed the radiological images as listed and agreed with the findings in the report.  CT chest, abdomen and pelvis with contrast 09/16/2014 IMPRESSION: Annular constricting masslike soft tissue density involving the cecum measuring 2.3 x 3.3 cm, highly suspicious for metachronous primary colon carcinoma. Colonoscopy is recommended for further evaluation.  Increased size of 1.5 cm right pericolonic soft tissue nodule or lymph node, consistent with metastatic disease. Small less than 1 cm right lower quadrant mesenteric lymph nodes remain stable; local metastatic disease cannot be excluded.  No liver metastases identified.  Two new ill-defined pulmonary nodules in right upper lobe and left lower lobe, suspicious for pulmonary metastases.  Stable incidental findings including 4.0 cm ascending thoracic aortic aneurysm, left thyroid lobe nodule, and cholelithiasis  CT chest 12/29/2014  IMPRESSION: 1. Interval stability of previously described right upper lobe 1.1 cm pulmonary nodule, which has been stable since 09/19/2012, suggesting a benign etiology. 2. Interval decreased size of previously described left lower lobe pulmonary nodule, suggesting a resolving inflammatory lesion. 3. No new significant pulmonary nodules. No  evidence of metastatic disease in the chest. 4. Stable multinodular goiter, tiny hiatal hernia and cholelithiasis .  ASSESSMENT & PLAN:  82 year old Caucasian female, with past medical history of colon diverticulosis and arthritis, but otherwise healthy and active, who was found to have a large sigmoid tumor, status post sigmoidectomy.  1. Sigmoid colon adenocarcinoma, pT3N0M0, stage II, G1, MSI stable, ascending colon adenocarcinoma, pT4aN1cM0, stage IIIB, MSI stable, G2 -She had multifocal (2) colon cancer status post complete surgical resection,  -I reviewed her recent surgical pathology results in great details with her. The right colon cancer has several high-risk features, including stage IIIB, T4a lesion, tumor deposits, lymphovascular invasion, her risk of colon cancer recurrence is high.  -She is currently on adjuvant Xeloda, plan for a total of 8 cycles  -She did not tolerate first cycle Xeloda well, with severe diarrhea, anorexia and weight loss.  We decreased her dose on second cycle, and she has been tolerating well.  We'll continue.  2. Diarrhea and anorexia  -secondary to chemotherapy, much less with reduced dose Xeloda -I reviewed the management I diarrhea, she knows to use Imodium and Lomotil as needed.  3.  Peripheral neuropathy, G 1 - noticed from second cycle of Xeloda,  We'll monitor closely.  4.Lung nodules  -She has two small right lung nodules which were discovered on the CT scan in 09/2012,  RUL nodule slightly bigger on recent CT -She has been follow-up with Dr. Cyndia Bent.  -she never smoked, low risk for lung cancer -Repeat his CT chest on 12/29/2014 showed stable right upper lobe pulmonary nodule, and interval decreased size of left lower lobe nodule. No other new lesions. -given the stability of the RUL nodule over 2 years, this is likely a benign lesion   Plan -lab reviewed with her, which are normal  -start cycle 4 Xeloda on 2/22 - I'll see her back in 4  weeks with lab, per pt's request (she will be out of town in 3 weeks), will postpone her next cycle for one week.   All questions were answered. The patient knows to call the clinic with any problems, questions or concerns.  I spent 15 minutes counseling the patient face to face. The total time spent in the appointment was 20  minutes and more than 50% was on counseling.     Truitt Merle, MD 04/04/2015 12:06 PM

## 2015-04-04 NOTE — Telephone Encounter (Signed)
Gave patient avs report and appointments for March. Per patient lab/fu scheduled for 3/20 due to patient will be out of town the week of 3/13. Per patient request message left for desk nurse inquiring about oral chemo meds to be called into [facility] pharmacy.

## 2015-04-05 ENCOUNTER — Ambulatory Visit: Payer: Self-pay | Admitting: Hematology

## 2015-04-05 ENCOUNTER — Other Ambulatory Visit: Payer: Self-pay

## 2015-04-06 ENCOUNTER — Encounter: Payer: Self-pay | Admitting: Hematology

## 2015-04-07 DIAGNOSIS — M5380 Other specified dorsopathies, site unspecified: Secondary | ICD-10-CM | POA: Diagnosis not present

## 2015-04-07 DIAGNOSIS — J302 Other seasonal allergic rhinitis: Secondary | ICD-10-CM | POA: Diagnosis not present

## 2015-04-07 DIAGNOSIS — M810 Age-related osteoporosis without current pathological fracture: Secondary | ICD-10-CM | POA: Diagnosis not present

## 2015-04-07 DIAGNOSIS — H3530 Unspecified macular degeneration: Secondary | ICD-10-CM | POA: Diagnosis not present

## 2015-04-07 DIAGNOSIS — F418 Other specified anxiety disorders: Secondary | ICD-10-CM | POA: Diagnosis not present

## 2015-04-07 DIAGNOSIS — M25561 Pain in right knee: Secondary | ICD-10-CM | POA: Diagnosis not present

## 2015-04-07 DIAGNOSIS — Z6825 Body mass index (BMI) 25.0-25.9, adult: Secondary | ICD-10-CM | POA: Diagnosis not present

## 2015-04-07 DIAGNOSIS — Z1389 Encounter for screening for other disorder: Secondary | ICD-10-CM | POA: Diagnosis not present

## 2015-04-07 DIAGNOSIS — R918 Other nonspecific abnormal finding of lung field: Secondary | ICD-10-CM | POA: Diagnosis not present

## 2015-04-07 DIAGNOSIS — M25562 Pain in left knee: Secondary | ICD-10-CM | POA: Diagnosis not present

## 2015-04-07 DIAGNOSIS — K219 Gastro-esophageal reflux disease without esophagitis: Secondary | ICD-10-CM | POA: Diagnosis not present

## 2015-04-07 DIAGNOSIS — Z0000 Encounter for general adult medical examination without abnormal findings: Secondary | ICD-10-CM | POA: Diagnosis not present

## 2015-04-07 MED FILL — CAPECITABINE 500 MG TABLET: 500 | 21 days supply | Qty: 56 | Fill #1

## 2015-04-15 DIAGNOSIS — J302 Other seasonal allergic rhinitis: Secondary | ICD-10-CM | POA: Diagnosis not present

## 2015-04-15 DIAGNOSIS — J0100 Acute maxillary sinusitis, unspecified: Secondary | ICD-10-CM | POA: Diagnosis not present

## 2015-04-15 DIAGNOSIS — Z6825 Body mass index (BMI) 25.0-25.9, adult: Secondary | ICD-10-CM | POA: Diagnosis not present

## 2015-04-21 ENCOUNTER — Telehealth: Payer: Self-pay | Admitting: *Deleted

## 2015-04-21 ENCOUNTER — Other Ambulatory Visit: Payer: Self-pay | Admitting: Hematology

## 2015-04-21 DIAGNOSIS — C182 Malignant neoplasm of ascending colon: Secondary | ICD-10-CM

## 2015-04-21 MED ORDER — HYDROCORTISONE 1 % EX LOTN: 1.0000 "application " | TOPICAL_LOTION | Freq: Two times a day (BID) | CUTANEOUS | Status: DC

## 2015-04-21 MED ORDER — UREA 10 % EX CREA: TOPICAL_CREAM | Freq: Two times a day (BID) | CUTANEOUS | Status: DC

## 2015-04-21 NOTE — Telephone Encounter (Signed)
Received vm call from pt stating that she needs some help with her hands because they are driving her crazy.  She reports that they are red, itchy, swollen & sensitive & Dr Burr Medico said that there were some things that would help.  Message to Dr Burr Medico.

## 2015-04-22 NOTE — Telephone Encounter (Signed)
Rx's sent to her pharmacy & pt notified.

## 2015-05-02 ENCOUNTER — Encounter: Payer: Self-pay | Admitting: Hematology

## 2015-05-02 ENCOUNTER — Ambulatory Visit (HOSPITAL_BASED_OUTPATIENT_CLINIC_OR_DEPARTMENT_OTHER): Payer: PPO | Admitting: Hematology

## 2015-05-02 ENCOUNTER — Telehealth: Payer: Self-pay | Admitting: Hematology

## 2015-05-02 ENCOUNTER — Other Ambulatory Visit (HOSPITAL_BASED_OUTPATIENT_CLINIC_OR_DEPARTMENT_OTHER): Payer: PPO

## 2015-05-02 VITALS — BP 127/70 | HR 70 | Temp 97.7°F | Resp 17 | Ht 63.0 in

## 2015-05-02 DIAGNOSIS — C189 Malignant neoplasm of colon, unspecified: Secondary | ICD-10-CM

## 2015-05-02 DIAGNOSIS — L271 Localized skin eruption due to drugs and medicaments taken internally: Secondary | ICD-10-CM

## 2015-05-02 DIAGNOSIS — F419 Anxiety disorder, unspecified: Secondary | ICD-10-CM | POA: Diagnosis not present

## 2015-05-02 DIAGNOSIS — C182 Malignant neoplasm of ascending colon: Secondary | ICD-10-CM

## 2015-05-02 DIAGNOSIS — C186 Malignant neoplasm of descending colon: Secondary | ICD-10-CM

## 2015-05-02 DIAGNOSIS — R197 Diarrhea, unspecified: Secondary | ICD-10-CM | POA: Diagnosis not present

## 2015-05-02 DIAGNOSIS — G620 Drug-induced polyneuropathy: Secondary | ICD-10-CM

## 2015-05-02 DIAGNOSIS — R918 Other nonspecific abnormal finding of lung field: Secondary | ICD-10-CM

## 2015-05-02 DIAGNOSIS — C187 Malignant neoplasm of sigmoid colon: Secondary | ICD-10-CM

## 2015-05-02 LAB — COMPREHENSIVE METABOLIC PANEL
ALBUMIN: 3.4 g/dL — AB (ref 3.5–5.0)
ALK PHOS: 89 U/L (ref 40–150)
ALT: 14 U/L (ref 0–55)
AST: 22 U/L (ref 5–34)
Anion Gap: 7 mEq/L (ref 3–11)
BILIRUBIN TOTAL: 1.59 mg/dL — AB (ref 0.20–1.20)
BUN: 13.2 mg/dL (ref 7.0–26.0)
CO2: 28 mEq/L (ref 22–29)
CREATININE: 0.8 mg/dL (ref 0.6–1.1)
Calcium: 8.7 mg/dL (ref 8.4–10.4)
Chloride: 107 mEq/L (ref 98–109)
EGFR: 69 mL/min/{1.73_m2} — AB (ref >90)
GLUCOSE: 73 mg/dL (ref 70–140)
Potassium: 3.4 mEq/L — ABNORMAL LOW (ref 3.5–5.1)
SODIUM: 142 meq/L (ref 136–145)
TOTAL PROTEIN: 6.4 g/dL (ref 6.4–8.3)

## 2015-05-02 LAB — CBC & DIFF AND RETIC
BASO%: 0.4 % (ref 0.0–2.0)
Basophils Absolute: 0 10*3/uL (ref 0.0–0.1)
EOS ABS: 0.3 10*3/uL (ref 0.0–0.5)
EOS%: 4 % (ref 0.0–7.0)
HCT: 33.8 % — ABNORMAL LOW (ref 34.8–46.6)
HEMOGLOBIN: 11.6 g/dL (ref 11.6–15.9)
IMMATURE RETIC FRACT: 6.2 % (ref 1.60–10.00)
LYMPH%: 10.7 % — AB (ref 14.0–49.7)
MCH: 33.4 pg (ref 25.1–34.0)
MCHC: 34.3 g/dL (ref 31.5–36.0)
MCV: 97.4 fL (ref 79.5–101.0)
MONO#: 0.6 10*3/uL (ref 0.1–0.9)
MONO%: 8.5 % (ref 0.0–14.0)
NEUT%: 76.4 % (ref 38.4–76.8)
NEUTROS ABS: 5.5 10*3/uL (ref 1.5–6.5)
Platelets: 177 10*3/uL (ref 145–400)
RBC: 3.47 10*6/uL — ABNORMAL LOW (ref 3.70–5.45)
RDW: 17.2 % — ABNORMAL HIGH (ref 11.2–14.5)
Retic %: 1.49 % (ref 0.70–2.10)
Retic Ct Abs: 51.7 10*3/uL (ref 33.70–90.70)
WBC: 7.2 10*3/uL (ref 3.9–10.3)
lymph#: 0.8 10*3/uL — ABNORMAL LOW (ref 0.9–3.3)

## 2015-05-02 MED ORDER — POTASSIUM CHLORIDE CRYS ER 20 MEQ PO TBCR: 20.0000 meq | EXTENDED_RELEASE_TABLET | Freq: Every day | ORAL | Status: AC

## 2015-05-02 MED ORDER — ONDANSETRON HCL 4 MG PO TABS: 4.0000 mg | ORAL_TABLET | Freq: Three times a day (TID) | ORAL | Status: DC | PRN

## 2015-05-02 MED ORDER — DIPHENOXYLATE-ATROPINE 2.5-0.025 MG PO TABS
1.0000 | ORAL_TABLET | Freq: Four times a day (QID) | ORAL | Status: DC | PRN
Start: 2015-05-02 — End: 2016-02-23

## 2015-05-02 NOTE — Telephone Encounter (Signed)
per pof to sch pt appt-gave pt avs to sch trmt

## 2015-05-02 NOTE — Progress Notes (Signed)
- [place]  [phone] Fax:([phone]) [phone]  Clinic Follow Up Note   Patient Care Team: Prince Solian, MD as PCP - General (Internal Medicine) Gaye Pollack, MD as Consulting Physician (Cardiothoracic Surgery) Gatha Mayer, MD as Consulting Physician (Gastroenterology) Leighton Ruff, MD as Consulting Physician (General Surgery) Tania Ade, RN as Registered Nurse (Medical Oncology) 05/02/2015  CHIEF COMPLAINTS:  Follow-up of colon cancer  Oncology History   Cancer of left colon [facility])   Staging form: Colon and Rectum, AJCC 7th Edition     Pathologic stage from 05/17/2014: Stage IIA (T3, N0, cM0) - Signed by Truitt Merle, MD on 02/21/2015 Cancer of right colon [facility])   Staging form: Colon and Rectum, AJCC 7th Edition     Pathologic stage from 12/08/2014: Stage IIIB (T4a, N1a, cM0) - Signed by Truitt Merle, MD on 02/21/2015       Cancer of right colon ([place])   04/09/2014 Procedure Colonoscopy per Dr. Silvano Rusk: Large fleshy mass sigmoid colon 25 cm from anus. Could not pass through.   04/09/2014 Tumor Marker CEA =11.7   04/12/2014 Imaging CT C/A/P:sigmoid colon mass;several small areas of soft tissue nodularity in peritoneal cavity; small attenuation structure posterior right hepatic lobe; 2 stable nodules in lungs dating back to 2014. No acute findings.   05/17/2014 Initial Diagnosis Colon cancer-presented with heme + stool and more frequent BMs   05/17/2014 Surgery Robot assisted sigmoidectomy   05/17/2014 Pathologic Stage pT3,pN0,pMX--Grade 1--0/13 nodes +--MMR normal   05/17/2014 Clinical Stage Stage II   12/08/2014 Surgery right hemicolectomy    12/08/2014 Pathology Results right colon adenocarcinoma, pT4aN1c, 19 nodes were negative, LVI(+), MMR normal    01/17/2015 -  Chemotherapy adjuvant capecitabine 1553m q12h, 2 weeks on and 1 week off. Second cycle was postponed to 02/22/2015 due to side effects and dose reduced to 10026mq12h/    HISTORY OF PRESENTING  ILLNESS:  Heidi BEIDLEMAN89.o. female is here because of recently diagnosed a stage II sigmoid colon cancer.  She had annual physical with her PCP in early Feb this year, and stool OB (+), no overt GI bleeding, CBC was normal. She had noticed some loose BM before that, but no nausea, loss of appetite, weight loss or other symoptoms. Colonoscopy showed a large mass in the sigmoid colon 25 cm from anus. Biopsy was taken which showed tubulovillous adenoma, no malignancy. She was referred to surgeon Dr. ThMarcello Mooresnd underwent robotic-assisted sigmoidectomy on 4/4. She tolerated the procedure well, and was discharged home afterwards.  She has recovered well from the surgery 3 weeks ago, she has mild fatigue, but otherwise doing very well without other complains. She had 2-3 episodes of bleeding after the surgery, which was related to her herromods. She is eating well. 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   She has chronic knee pain she takes ibuprofen once daily.    CURRENT THERAPY: adjuvant chemotherapy capecitabine 150024m12h, day 1-14, every 21 days, started on 01/17/2015, second cycle was postponed to 1/10 due to side effects and dose reduced to 1000m59m2h   INTERIM HISTORY: [name] for follow-up. She has completed cycle 4 Xeloda on 04/21/2015. She tolerated the treatment well, but developped worsening diarrhea and skin cracks and peeling after she completed Xeloda, I called in Urea cream, which helped. She has been taking imodium 2-3 times a day, she still has 4-8 loose bowel movement every day. No  abdominal pain, nausea, bloating, fever or chills. She has been eating less, but able to drink and quit adequately. She drinks tea most of time. She has mild fatigue, able to function well at home, she has mild numbness on fingers, no tingling or pain. No other complaints.   MEDICAL HISTORY:  Past Medical History  Diagnosis Date  .  Diverticulosis of colon     severe in sigmoid but diffusley throughout colon  on 2009 colonoscopy  . Internal hemorrhoids   . Hematochezia 2009  . Benign esophageal stricture 04/2007    EGD with dilatation.  Marland Kitchen GERD (gastroesophageal reflux disease)   . HH (hiatus hernia)   . Macular degeneration   . IBS (irritable bowel syndrome)   . Epistaxis ~ 2005    required cauterization twice.   . Reflux esophagitis   . Allergy   . Cataract     ight eye starting  . Arthritis     knees  . History of skin cancer   . Colon cancer ([place])      COLON CANCER / HX SKIN CANCER    SURGICAL HISTORY: Past Surgical History  Procedure Laterality Date  . Abdominal hysterectomy      partial with the bladder tack  . Bladder tack    . Retinal laser surgery    . Basal cell resection  08/2011, 11/2011    initially biopsy 08/3011.  repeat excision right nose with grafting from the skin behind the right ear  . Colonoscopy    . Esophagogastroduodenoscopy    . Upper gastrointestinal endoscopy    . Inguinal hernia repair  early 1990s    right  . Colonoscopy with propofol N/A 09/30/2014    Procedure: COLONOSCOPY WITH PROPOFOL;  Surgeon: Milus Banister, MD;  Location: [facility];  Service: Endoscopy;  Laterality: N/A;  . Colon surgery  APRIL 2016    SIGMOIDECTOMY    SOCIAL HISTORY: Social History   Social History  . Marital Status: Married    Spouse Name: N/A  . Number of Children: N/A  . Years of Education: N/A   Occupational History  . Not on file.   Social History Main Topics  . Smoking status: Passive Smoke Exposure - Never Smoker  . Smokeless tobacco: Never Used  . Alcohol Use: No  . Drug Use: No  . Sexual Activity: Not on file   Other Topics Concern  . Not on file   Social History Narrative   Married, husband is disabled (physical and dementia) and she is caregiver   Has #1 child (daughter) living. Son died of MI at age 50   Worked at [employer] before she retired   Has #3  cats    FAMILY HISTORY: Family History  Problem Relation Age of Onset  . Colon cancer Paternal Grandfather 69    colon cancer   . Esophageal cancer Neg Hx   . Rectal cancer Neg Hx   . Stomach cancer Neg Hx   . Stroke Father   . Heart attack Son 85    died of heart attack     ALLERGIES:  is allergic to penicillins; sulfonamide derivatives; and amoxicillin-pot clavulanate.  MEDICATIONS:  Current Outpatient Prescriptions  Medication Sig Dispense Refill  . alendronate (FOSAMAX) 70 MG tablet Take 70 mg by mouth every 7 (seven) days. Tuesdays. Take with a full glass of water on an empty stomach.    . Ascorbic Acid (VITAMIN C) 1000 MG tablet Take 1,000 mg by mouth daily.    Marland Kitchen  Biotin 1000 MCG tablet Take 1,000 mcg by mouth daily.     Marland Kitchen CALCIUM & MAGNESIUM CARBONATES PO Take 1 tablet by mouth daily.    . cholecalciferol (VITAMIN D) 1000 UNITS tablet Take 1,000 Units by mouth daily.    Marland Kitchen glucosamine-chondroitin 500-400 MG tablet Take 1 tablet by mouth daily.     . Homeopathic Products ([employer] LEG CRAMPS PAIN RELIEF PO) Take 1 tablet by mouth daily as needed (for leg cramps).    Marland Kitchen HYDROcodone-acetaminophen (NORCO/VICODIN) 5-325 MG tablet Take 1-2 tablets by mouth every 6 (six) hours as needed. 30 tablet 0  . hydrocortisone 1 % lotion Apply 1 application topically 2 (two) times daily. Apply to hands 118 mL 0  . ibuprofen (ADVIL,MOTRIN) 200 MG tablet Take 200 mg by mouth daily as needed for moderate pain.     Marland Kitchen loratadine (CLARITIN) 10 MG tablet Take 10 mg by mouth daily.    . magic mouthwash w/lidocaine SOLN Take 5 mLs by mouth 4 (four) times daily as needed for mouth pain. 240 mL 1  . Multiple Vitamin (MULTIVITAMIN) tablet Take 1 tablet by mouth daily.    . urea (CARMOL) 10 % cream Apply topically 2 (two) times daily. Apply to hand twice daily 142 g 0  . vitamin B-12 (CYANOCOBALAMIN) 1000 MCG tablet Take 1,000 mcg by mouth daily.     . capecitabine (XELODA) 500 MG tablet Take 2 tablets (1,000  mg total) by mouth 2 (two) times daily after a meal. Take for 14 days then off for 7 days (Patient not taking: Reported on 05/02/2015) 56 tablet 1  . diphenoxylate-atropine (LOMOTIL) 2.5-0.025 MG tablet Take 2 tablets by mouth 4 (four) times daily as needed for diarrhea or loose stools. (Patient not taking: Reported on 05/02/2015) 30 tablet 0   No current facility-administered medications for this visit.    REVIEW OF SYSTEMS:   Constitutional: Denies fevers, chills or abnormal night sweats Eyes: Denies blurriness of vision, double vision or watery eyes Ears, nose, mouth, throat, and face: Denies mucositis or sore throat Respiratory: Denies cough, dyspnea or wheezes Cardiovascular: Denies palpitation, chest discomfort or lower extremity swelling Gastrointestinal:  Denies nausea, heartburn or change in bowel habits Skin: Denies abnormal skin rashes Lymphatics: Denies new lymphadenopathy or easy bruising Neurological:Denies numbness, tingling or new weaknesses Behavioral/Psych: Mood is stable, no new changes  All other systems were reviewed with the patient and are negative.  PHYSICAL EXAMINATION: ECOG PERFORMANCE STATUS: 1  Filed Vitals:   05/02/15 1305  BP: 127/70  Pulse: 70  Temp: 97.7 F (36.5 C)  Resp: 17   There were no vitals filed for this visit.  GENERAL:alert, no distress and comfortable SKIN: skin color, texture, turgor are normal, no rashes or significant lesions except skin redness on palms and bottom of her feet, with skin peeling, a few skin rashes on her hands and arms  EYES: normal, conjunctiva are pink and non-injected, sclera clear OROPHARYNX:no exudate, no erythema and lips, buccal mucosa, and tongue normal  NECK: supple, thyroid normal size, non-tender, without nodularity LYMPH:  no palpable lymphadenopathy in the cervical, axillary or inguinal LUNGS: clear to auscultation and percussion with normal breathing effort HEART: regular rate & rhythm and no murmurs  and no lower extremity edema ABDOMEN:abdomen soft, surgical incision and to the low abdomen is well healed. non-tender and normal bowel sounds Musculoskeletal:no cyanosis of digits and no clubbing  PSYCH: alert & oriented x 3 with fluent speech NEURO: no focal motor/sensory deficits  LABORATORY  DATA:  I have reviewed the data as listed CBC Latest Ref Rng 05/02/2015 04/04/2015 03/07/2015  WBC 3.9 - 10.3 10e3/uL 7.2 7.7 5.2  Hemoglobin 11.6 - 15.9 g/dL 11.6 12.7 12.1  Hematocrit 34.8 - 46.6 % 33.8(L) 37.5 36.2  Platelets 145 - 400 10e3/uL 177 163 164   CMP Latest Ref Rng 05/02/2015 04/04/2015 03/07/2015  Glucose 70 - 140 mg/dl 73 107 113  BUN 7.0 - 26.0 mg/dL 13.2 19.2 14.7  Creatinine 0.6 - 1.1 mg/dL 0.8 1.0 1.1  Sodium 136 - 145 mEq/L 142 145 144  Potassium 3.5 - 5.1 mEq/L 3.4(L) 4.0 3.8  CO2 22 - 29 mEq/L 28 30(H) 29  Calcium 8.4 - 10.4 mg/dL 8.7 9.8 9.1  Total Protein 6.4 - 8.3 g/dL 6.4 7.0 6.9  Total Bilirubin 0.20 - 1.20 mg/dL 1.59(H) 0.94 0.93  Alkaline Phos 40 - 150 U/L 89 86 82  AST 5 - 34 U/L '22 23 21  ' ALT 0 - 55 U/L '14 12 12     ' Pathology report: Diagnosis 05/17/2014 1. Colon, segmental resection for tumor, sigmoid, open end proximal - INVASIVE ADENOCARCINOMA WITH EXTRACELLULAR MUCIN, INVADING THROUGH THE MUSCULARIS PROPRIA INTO PERICOLONIC FATTY TISSUE. - THIRTEEN LYMPH NODES, NEGATIVE FOR METASTATIC CARCINOMA (0/13). - MULTIPLE DIVERTICULA. - RESECTION MARGINS, NEGATIVE FOR ATYPIA OR MALIGNANCY. - PLEASE SEE ONCOLOGY TEMPLATE FOR DETAILS. 2. Colon, resection margin (donut), sigmoid - BENIGN COLONIC MUCOSA, NO EVIDENCE OF DYSPLASIA OR MALIGNANCY. Specimen: Left colon. Procedure: Segmental resection. Tumor site: Sigmoid colon. Specimen integrity: Intact. Macroscopic intactness of mesorectum: N/A Macroscopic tumor perforation: No Invasive tumor: Invasive adenocarcinoma with extracellular mucin. Maximum size: 8.0 cm, gross measurement. Histologic type(s): Invasive  adenocarcinoma with extracellular mucin. Histologic grade and differentiation: G1: well differentiated/low grade Type of polyp in which invasive carcinoma arose: Villous adenoma. Microscopic extension of invasive tumor: Invading through the muscularis propria into pericolonic fatty tissue. Lymph-Vascular invasion: Not identified. Peri-neural invasion: Not identified. Tumor deposit(s) (discontinuous extramural extension): None. Resection margins: Negative. Proximal margin: 9.6 cm. Distal margin: 9.6 cm. Mesenteric margin (sigmoid and transverse): 6 cm. Treatment effect (neo-adjuvant therapy): No Additional polyp(s): N/A Non-neoplastic findings: Prominent Crohn-like reaction around the tumor. Lymph nodes: number examined 13; number positive: 0 Pathologic Staging: pT3, pN0, pMX  Diagnosis 12/08/2014  Colon, segmental resection for tumor, right colon MODERATELY DIFFERENTIATED ADENOCARCINOMA OF THE COLON (3.8 CM) THE TUMOR PENETRATES TO THE SURFACE OF THE VISCERAL PERITONEUM (T4A) LYMPHOVASCULAR INVASION IS IDENTIFIED MARGINS OF RESECTION ARE NEGATIVE FOR TUMOR NINTEEN BENIGN LYMPH NODE (0/19) ONE TUMOR DEPOSITE IS IDENTIFIED ([place]) Specimen: Colon Procedure: Segmental resection Tumor site: Ascending colon Specimen integrity: Intact Macroscopic intactness of mesorectum: Not applicable: Macroscopic tumor perforation: The tumor invades through the muscularis propria penatrate the surface of serosa Invasive tumor: Maximum size: 3.8 Histologic type(s): Adenocarcinoma G2 G2: moderately differentiated/low grade Type of polyp in which invasive carcinoma arose: Tubular adenoma Microscopic extension of invasive tumor: Serosa Lymph-Vascular invasion: Present Peri-neural invasion: Negative Tumor deposit(s) (discontinuous extramural extension): Present Resection margins: Proximal margin: 12 cm Distal margin: 23 cm Circumferential (radial) (posterior ascending, posterior descending;  lateral and posterior mid-rectum; and entire lower 1/3 rectum):3.5 cm Mesenteric margin (sigmoid and transverse): NA Distance closest margin (if all above margins negative): _ Treatment effect (neo-adjuvant therapy): Negative Additional polyp(s): Negative Non-neoplastic findings: Unremarkabel Lymph nodes: number examined 19; number positive: 0 Pathologic Staging: T4a, N1c, M_  MMR: NORMAL MSI: STABLE  Mismatch Repair (MMR) Protein Immunohistochemistry (IHC) IHC Expression Result: MLH1: Preserved nuclear expression (greater 50% tumor expression) MSH2:  Preserved nuclear expression (greater 50% tumor expression) MSH6: Preserved nuclear expression (greater 50% tumor expression) PMS2: Preserved nuclear expression (greater 50% tumor expression) * Internal control demonstrates intact nuclear expression Interpretation: NORMAL   RADIOGRAPHIC STUDIES: I have personally reviewed the radiological images as listed and agreed with the findings in the report.  CT chest, abdomen and pelvis with contrast 09/16/2014 IMPRESSION: Annular constricting masslike soft tissue density involving the cecum measuring 2.3 x 3.3 cm, highly suspicious for metachronous primary colon carcinoma. Colonoscopy is recommended for further evaluation.  Increased size of 1.5 cm right pericolonic soft tissue nodule or lymph node, consistent with metastatic disease. Small less than 1 cm right lower quadrant mesenteric lymph nodes remain stable; local metastatic disease cannot be excluded.  No liver metastases identified.  Two new ill-defined pulmonary nodules in right upper lobe and left lower lobe, suspicious for pulmonary metastases.  Stable incidental findings including 4.0 cm ascending thoracic aortic aneurysm, left thyroid lobe nodule, and cholelithiasis  CT chest 12/29/2014  IMPRESSION: 1. Interval stability of previously described right upper lobe 1.1 cm pulmonary nodule, which has been stable since  09/19/2012, suggesting a benign etiology. 2. Interval decreased size of previously described left lower lobe pulmonary nodule, suggesting a resolving inflammatory lesion. 3. No new significant pulmonary nodules. No evidence of metastatic disease in the chest. 4. Stable multinodular goiter, tiny hiatal hernia and cholelithiasis .  ASSESSMENT & PLAN:  82 year old Caucasian female, with past medical history of colon diverticulosis and arthritis, but otherwise healthy and active, who was found to have a large sigmoid tumor, status post sigmoidectomy.  1. Sigmoid colon adenocarcinoma, pT3N0M0, stage II, G1, MSI stable, ascending colon adenocarcinoma, pT4aN1cM0, stage IIIB, MSI stable, G2 -She had multifocal (2) colon cancer status post complete surgical resection,  -I reviewed her recent surgical pathology results in great details with her. The right colon cancer has several high-risk features, including stage IIIB, T4a lesion, tumor deposits, lymphovascular invasion, her risk of colon cancer recurrence is high.  -She is currently on adjuvant Xeloda, plan for a total of 8 cycles  -She did not tolerate first cycle Xeloda well, with severe diarrhea, anorexia and weight loss.  We decreased her dose on second cycle, and she has been tolerating better. -given her diarrhea, I'll postpone her next cycle Xeloda until her diarrhea resolves. -if her tolerance became an issue again, I'll stop her adjuvant Xeloda.  2. Diarrhea and anorexia  -secondary to chemotherapy -unlikely C. Difficile no other GI infection. She has no fever, elevated WBC, or other symptoms. I encouraged her to give [place] some stool sample if she can today to check for C. diff -I reviewed the management I diarrhea, she knows to use Imodium and Lomotil as needed. Lomotil was called in today.  3.  Peripheral neuropathy, G 1 - noticed from second cycle of Xeloda,  We'll monitor closely.  4. Hand-foot syndrome from Xeloda -she'll continue  use urea cream and moisturizer -Continue watch carefully  5. GERD -she'll continue Protonix, I refilled for her today   4.Lung nodules  -She has two small right lung nodules which were discovered on the CT scan in 09/2012,  RUL nodule slightly bigger on recent CT -She has been follow-up with Dr. Cyndia Bent.  -she never smoked, low risk for lung cancer -Repeat his CT chest on 12/29/2014 showed stable right upper lobe pulmonary nodule, and interval decreased size of left lower lobe nodule. No other new lesions. -given the stability of the RUL nodule over 2 years,  this is likely a benign lesion   Plan -postpone her cycle 5 Xeloda until diarrhea resolves, at least 3 days after, she will call us when she starts Xeloda. -She'll use Imodium and pickup prescription of Lomotil for her diarrhea -I'll see her back in 3 weeks.  All questions were answered. The patient knows to call the clinic with any problems, questions or concerns.  I spent 20 minutes counseling the patient face to face. The total time spent in the appointment was 25  minutes and more than 50% was on counseling.     Truitt Merle, MD 05/02/2015

## 2015-05-03 LAB — CEA: CEA: 2.6 ng/mL (ref 0.0–4.7)

## 2015-05-03 LAB — CEA (PARALLEL TESTING): CEA: 1.4 ng/mL

## 2015-05-09 ENCOUNTER — Other Ambulatory Visit: Payer: Self-pay | Admitting: Hematology

## 2015-05-09 ENCOUNTER — Telehealth: Payer: Self-pay | Admitting: *Deleted

## 2015-05-09 DIAGNOSIS — C189 Malignant neoplasm of colon, unspecified: Secondary | ICD-10-CM

## 2015-05-09 DIAGNOSIS — C186 Malignant neoplasm of descending colon: Secondary | ICD-10-CM

## 2015-05-09 MED ORDER — CAPECITABINE 500 MG PO TABS: 1000.0000 mg | ORAL_TABLET | Freq: Two times a day (BID) | ORAL | Status: DC

## 2015-05-09 MED FILL — CAPECITABINE 500 MG TABLET: 500 | 6 days supply | Qty: 26 | Fill #0

## 2015-05-09 NOTE — Telephone Encounter (Signed)
Oncology Nurse Navigator Documentation  Oncology Nurse Navigator Flowsheets 05/09/2015  Navigator Location [facility]  Navigator Encounter Type Telephone  Telephone Outgoing Call;Patient Update;Symptom Mgt  Patient Visit Type -  Treatment Phase Active Tx--resumed 1000 mg bid today--will be #26 pills short to complete this cycle.  Barriers/Navigation Needs Education  Education Symptom Management-use Lomotil for diarrhea and call office if she needs to resume this  Interventions Other  Education Method -  Acuity Level 1  Time Spent with Patient 15  She reports she will need refill of #26 pills to complete this cycle of chemo. Will inform collaborative nurse of refill request.

## 2015-05-23 ENCOUNTER — Telehealth: Payer: Self-pay | Admitting: Hematology

## 2015-05-23 ENCOUNTER — Encounter: Payer: Self-pay | Admitting: Hematology

## 2015-05-23 ENCOUNTER — Ambulatory Visit (HOSPITAL_BASED_OUTPATIENT_CLINIC_OR_DEPARTMENT_OTHER): Payer: PPO | Admitting: Hematology

## 2015-05-23 ENCOUNTER — Other Ambulatory Visit (HOSPITAL_BASED_OUTPATIENT_CLINIC_OR_DEPARTMENT_OTHER): Payer: PPO

## 2015-05-23 VITALS — BP 135/77 | HR 88 | Temp 98.5°F | Resp 18 | Wt 138.5 lb

## 2015-05-23 DIAGNOSIS — R630 Anorexia: Secondary | ICD-10-CM | POA: Diagnosis not present

## 2015-05-23 DIAGNOSIS — R918 Other nonspecific abnormal finding of lung field: Secondary | ICD-10-CM

## 2015-05-23 DIAGNOSIS — C187 Malignant neoplasm of sigmoid colon: Secondary | ICD-10-CM | POA: Diagnosis not present

## 2015-05-23 DIAGNOSIS — C189 Malignant neoplasm of colon, unspecified: Secondary | ICD-10-CM

## 2015-05-23 DIAGNOSIS — C186 Malignant neoplasm of descending colon: Secondary | ICD-10-CM

## 2015-05-23 DIAGNOSIS — L271 Localized skin eruption due to drugs and medicaments taken internally: Secondary | ICD-10-CM

## 2015-05-23 DIAGNOSIS — C182 Malignant neoplasm of ascending colon: Secondary | ICD-10-CM

## 2015-05-23 DIAGNOSIS — K219 Gastro-esophageal reflux disease without esophagitis: Secondary | ICD-10-CM

## 2015-05-23 DIAGNOSIS — R197 Diarrhea, unspecified: Secondary | ICD-10-CM | POA: Diagnosis not present

## 2015-05-23 DIAGNOSIS — G620 Drug-induced polyneuropathy: Secondary | ICD-10-CM

## 2015-05-23 LAB — COMPREHENSIVE METABOLIC PANEL
ALK PHOS: 78 U/L (ref 40–150)
ALT: 16 U/L (ref 0–55)
ANION GAP: 4 meq/L (ref 3–11)
AST: 25 U/L (ref 5–34)
Albumin: 3.6 g/dL (ref 3.5–5.0)
BUN: 17.7 mg/dL (ref 7.0–26.0)
CALCIUM: 9.3 mg/dL (ref 8.4–10.4)
CHLORIDE: 108 meq/L (ref 98–109)
CO2: 29 mEq/L (ref 22–29)
CREATININE: 0.9 mg/dL (ref 0.6–1.1)
EGFR: 62 mL/min/{1.73_m2} — ABNORMAL LOW (ref >90)
Glucose: 92 mg/dl (ref 70–140)
POTASSIUM: 4.3 meq/L (ref 3.5–5.1)
Sodium: 141 mEq/L (ref 136–145)
Total Bilirubin: 1.31 mg/dL — ABNORMAL HIGH (ref 0.20–1.20)
Total Protein: 6.5 g/dL (ref 6.4–8.3)

## 2015-05-23 LAB — CBC & DIFF AND RETIC
BASO%: 0.4 % (ref 0.0–2.0)
Basophils Absolute: 0 10*3/uL (ref 0.0–0.1)
EOS%: 2.8 % (ref 0.0–7.0)
Eosinophils Absolute: 0.2 10*3/uL (ref 0.0–0.5)
HEMATOCRIT: 36.3 % (ref 34.8–46.6)
HGB: 12.2 g/dL (ref 11.6–15.9)
IMMATURE RETIC FRACT: 4.5 % (ref 1.60–10.00)
LYMPH#: 1.2 10*3/uL (ref 0.9–3.3)
LYMPH%: 16.6 % (ref 14.0–49.7)
MCH: 33 pg (ref 25.1–34.0)
MCHC: 33.6 g/dL (ref 31.5–36.0)
MCV: 98.1 fL (ref 79.5–101.0)
MONO#: 0.6 10*3/uL (ref 0.1–0.9)
MONO%: 8.2 % (ref 0.0–14.0)
NEUT%: 72 % (ref 38.4–76.8)
NEUTROS ABS: 5.1 10*3/uL (ref 1.5–6.5)
PLATELETS: 144 10*3/uL — AB (ref 145–400)
RBC: 3.7 10*6/uL (ref 3.70–5.45)
RDW: 15.2 % — AB (ref 11.2–14.5)
RETIC %: 0.81 % (ref 0.70–2.10)
RETIC CT ABS: 29.97 10*3/uL — AB (ref 33.70–90.70)
WBC: 7.1 10*3/uL (ref 3.9–10.3)

## 2015-05-23 MED ORDER — CAPECITABINE 500 MG PO TABS: 1000.0000 mg | ORAL_TABLET | Freq: Two times a day (BID) | ORAL | Status: DC

## 2015-05-23 NOTE — Progress Notes (Signed)
- [place]  [phone] [phone]) [phone]  Clinic Follow Up Note   Patient Care Team: Prince Solian, MD as PCP - General (Internal Medicine) Gaye Pollack, MD as Consulting Physician (Cardiothoracic Surgery) Gatha Mayer, MD as Consulting Physician (Gastroenterology) Leighton Ruff, MD as Consulting Physician (General Surgery) Tania Ade, RN as Registered Nurse (Medical Oncology) 05/23/2015  CHIEF COMPLAINTS:  Follow-up of colon cancer  Oncology History   Cancer of left colon [facility])   Staging form: Colon and Rectum, AJCC 7th Edition     Pathologic stage from 05/17/2014: Stage IIA (T3, N0, cM0) - Signed by Truitt Merle, MD on 02/21/2015 Cancer of right colon [facility])   Staging form: Colon and Rectum, AJCC 7th Edition     Pathologic stage from 12/08/2014: Stage IIIB (T4a, N1a, cM0) - Signed by Truitt Merle, MD on 02/21/2015       Cancer of right colon ([employer])   04/09/2014 Procedure Colonoscopy per Dr. Silvano Rusk: Large fleshy mass sigmoid colon 25 cm from anus. Could not pass through.   04/09/2014 Tumor Marker CEA =11.7   04/12/2014 Imaging CT C/A/P:sigmoid colon mass;several small areas of soft tissue nodularity in peritoneal cavity; small attenuation structure posterior right hepatic lobe; 2 stable nodules in lungs dating back to 2014. No acute findings.   05/17/2014 Initial Diagnosis Colon cancer-presented with heme + stool and more frequent BMs   05/17/2014 Surgery Robot assisted sigmoidectomy   05/17/2014 Pathologic Stage pT3,pN0,pMX--Grade 1--0/13 nodes +--MMR normal   05/17/2014 Clinical Stage Stage II   12/08/2014 Surgery right hemicolectomy    12/08/2014 Pathology Results right colon adenocarcinoma, pT4aN1c, 19 nodes were negative, LVI(+), MMR normal    01/17/2015 -  Chemotherapy adjuvant capecitabine 1570m q12h, 2 weeks on and 1 week off. Second cycle was postponed to 02/22/2015 due to side effects and dose reduced to [id number]/    HISTORY OF PRESENTING  ILLNESS:  Heidi REININGER89.o. female is here because of recently diagnosed a stage II sigmoid colon cancer.  She had annual physical with her PCP in early Feb this year, and stool OB (+), no overt GI bleeding, CBC was normal. She had noticed some loose BM before that, but no nausea, loss of appetite, weight loss or other symoptoms. Colonoscopy showed a large mass in the sigmoid colon 25 cm from anus. Biopsy was taken which showed tubulovillous adenoma, no malignancy. She was referred to surgeon Dr. ThMarcello Mooresnd underwent robotic-assisted sigmoidectomy on 4/4. She tolerated the procedure well, and was discharged home afterwards.  She has recovered well from the surgery 3 weeks ago, she has mild fatigue, but otherwise doing very well without other complains. She had 2-3 episodes of bleeding after the surgery, which was related to her herromods. She is eating well. 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   She has chronic knee pain she takes ibuprofen once daily.    CURRENT THERAPY: adjuvant chemotherapy capecitabine 150031m12h, day 1-14, every 21 days, started on 01/17/2015, second cycle was postponed to 1/10 due to side effects and dose reduced to 1000m85m2h   INTERIM HISTORY: Ms [employer] for follow-up. She has completed cycle 5 Xeloda this morning. She still has mild diarrhea 3-4 times a day, Imodium was improvement, no abdominal pain or nausea. Her skin peeling has improved in her palms, but still happens in the bottom of her feet. No skin cracks, tingling or numbness. She has mild to  moderate fatigue, able to function well. No other new complaints.  MEDICAL HISTORY:  Past Medical History  Diagnosis Date  . Diverticulosis of colon     severe in sigmoid but diffusley throughout colon  on 2009 colonoscopy  . Internal hemorrhoids   . Hematochezia 2009  . Benign esophageal stricture 04/2007    EGD with dilatation.  Marland Kitchen GERD  (gastroesophageal reflux disease)   . HH (hiatus hernia)   . Macular degeneration   . IBS (irritable bowel syndrome)   . Epistaxis ~ 2005    required cauterization twice.   . Reflux esophagitis   . Allergy   . Cataract     ight eye starting  . Arthritis     knees  . History of skin cancer   . Colon cancer ([place])      COLON CANCER / HX SKIN CANCER    SURGICAL HISTORY: Past Surgical History  Procedure Laterality Date  . Abdominal hysterectomy      partial with the bladder tack  . Bladder tack    . Retinal laser surgery    . Basal cell resection  08/2011, 11/2011    initially biopsy 08/3011.  repeat excision right nose with grafting from the skin behind the right ear  . Colonoscopy    . Esophagogastroduodenoscopy    . Upper gastrointestinal endoscopy    . Inguinal hernia repair  early 1990s    right  . Colonoscopy with propofol N/A 09/30/2014    Procedure: COLONOSCOPY WITH PROPOFOL;  Surgeon: Milus Banister, MD;  Location: [facility] ENDOSCOPY;  Service: Endoscopy;  Laterality: N/A;  . Colon surgery  APRIL 2016    SIGMOIDECTOMY    SOCIAL HISTORY: Social History   Social History  . Marital Status: Married    Spouse Name: N/A  . Number of Children: N/A  . Years of Education: N/A   Occupational History  . Not on file.   Social History Main Topics  . Smoking status: Passive Smoke Exposure - Never Smoker  . Smokeless tobacco: Never Used  . Alcohol Use: No  . Drug Use: No  . Sexual Activity: Not on file   Other Topics Concern  . Not on file   Social History Narrative   Married, husband is disabled (physical and dementia) and she is caregiver   Has #1 child (daughter) living. Son died of MI at age 60   Worked at [employer] before she retired   Has #3 cats    FAMILY HISTORY: Family History  Problem Relation Age of Onset  . Colon cancer Paternal Grandfather 49    colon cancer   . Esophageal cancer Neg Hx   . Rectal cancer Neg Hx   . Stomach cancer Neg Hx   .  Stroke Father   . Heart attack Son 42    died of heart attack     ALLERGIES:  is allergic to penicillins; sulfonamide derivatives; and amoxicillin-pot clavulanate.  MEDICATIONS:  Current Outpatient Prescriptions  Medication Sig Dispense Refill  . alendronate (FOSAMAX) 70 MG tablet Take 70 mg by mouth every 7 (seven) days. Tuesdays. Take with a full glass of water on an empty stomach.    . Ascorbic Acid (VITAMIN C) 1000 MG tablet Take 1,000 mg by mouth daily.    . Biotin 1000 MCG tablet Take 1,000 mcg by mouth daily.     Marland Kitchen CALCIUM & MAGNESIUM CARBONATES PO Take 1 tablet by mouth daily.    . capecitabine (XELODA) 500 MG tablet  Take 2 tablets (1,000 mg total) by mouth 2 (two) times daily after a meal. Take for 14 days then off for 7 days 26 tablet 0  . cholecalciferol (VITAMIN D) 1000 UNITS tablet Take 1,000 Units by mouth daily.    . diphenoxylate-atropine (LOMOTIL) 2.5-0.025 MG tablet Take 1-2 tablets by mouth 4 (four) times daily as needed for diarrhea or loose stools. 30 tablet 2  . glucosamine-chondroitin 500-400 MG tablet Take 1 tablet by mouth daily.     . Homeopathic Products ([employer] LEG CRAMPS PAIN RELIEF PO) Take 1 tablet by mouth daily as needed (for leg cramps).    Marland Kitchen HYDROcodone-acetaminophen (NORCO/VICODIN) 5-325 MG tablet Take 1-2 tablets by mouth every 6 (six) hours as needed. 30 tablet 0  . hydrocortisone 1 % lotion Apply 1 application topically 2 (two) times daily. Apply to hands 118 mL 0  . ibuprofen (ADVIL,MOTRIN) 200 MG tablet Take 200 mg by mouth daily as needed for moderate pain.     Marland Kitchen loratadine (CLARITIN) 10 MG tablet Take 10 mg by mouth daily.    . magic mouthwash w/lidocaine SOLN Take 5 mLs by mouth 4 (four) times daily as needed for mouth pain. 240 mL 1  . Multiple Vitamin (MULTIVITAMIN) tablet Take 1 tablet by mouth daily.    . ondansetron (ZOFRAN) 4 MG tablet Take 1-2 tablets (4-8 mg total) by mouth every 8 (eight) hours as needed for nausea or vomiting. 20 tablet 2   . potassium chloride SA (K-DUR,KLOR-CON) 20 MEQ tablet Take 1 tablet (20 mEq total) by mouth daily. 20 tablet 1  . urea (CARMOL) 10 % cream Apply topically 2 (two) times daily. Apply to hand twice daily 142 g 0  . vitamin B-12 (CYANOCOBALAMIN) 1000 MCG tablet Take 1,000 mcg by mouth daily.      No current facility-administered medications for this visit.    REVIEW OF SYSTEMS:   Constitutional: Denies fevers, chills or abnormal night sweats Eyes: Denies blurriness of vision, double vision or watery eyes Ears, nose, mouth, throat, and face: Denies mucositis or sore throat Respiratory: Denies cough, dyspnea or wheezes Cardiovascular: Denies palpitation, chest discomfort or lower extremity swelling Gastrointestinal:  Denies nausea, heartburn or change in bowel habits Skin: Denies abnormal skin rashes Lymphatics: Denies new lymphadenopathy or easy bruising Neurological:Denies numbness, tingling or new weaknesses Behavioral/Psych: Mood is stable, no new changes  All other systems were reviewed with the patient and are negative.  PHYSICAL EXAMINATION: ECOG PERFORMANCE STATUS: 1  Filed Vitals:   05/23/15 1323  BP: 135/77  Pulse: 88  Temp: 98.5 F (36.9 C)  Resp: 18   Filed Weights   05/23/15 1323  Weight: 138 lb 8 oz (62.823 kg)    GENERAL:alert, no distress and comfortable SKIN: skin color, texture, turgor are normal, no rashes or significant lesions except skin redness on palms and bottom of her feet, with skin peeling, a few skin rashes on her hands and arms  EYES: normal, conjunctiva are pink and non-injected, sclera clear OROPHARYNX:no exudate, no erythema and lips, buccal mucosa, and tongue normal  NECK: supple, thyroid normal size, non-tender, without nodularity LYMPH:  no palpable lymphadenopathy in the cervical, axillary or inguinal LUNGS: clear to auscultation and percussion with normal breathing effort HEART: regular rate & rhythm and no murmurs and no lower extremity  edema ABDOMEN:abdomen soft, surgical incision and to the low abdomen is well healed. non-tender and normal bowel sounds Musculoskeletal:no cyanosis of digits and no clubbing  PSYCH: alert & oriented x 3  with fluent speech NEURO: no focal motor/sensory deficits  LABORATORY DATA:  I have reviewed the data as listed CBC Latest Ref Rng 05/23/2015 05/02/2015 04/04/2015  WBC 3.9 - 10.3 10e3/uL 7.1 7.2 7.7  Hemoglobin 11.6 - 15.9 g/dL 12.2 11.6 12.7  Hematocrit 34.8 - 46.6 % 36.3 33.8(L) 37.5  Platelets 145 - 400 10e3/uL 144(L) 177 163   CMP Latest Ref Rng 05/23/2015 05/02/2015 04/04/2015  Glucose 70 - 140 mg/dl 92 73 107  BUN 7.0 - 26.0 mg/dL 17.7 13.2 19.2  Creatinine 0.6 - 1.1 mg/dL 0.9 0.8 1.0  Sodium 136 - 145 mEq/L 141 142 145  Potassium 3.5 - 5.1 mEq/L 4.3 3.4(L) 4.0  CO2 22 - 29 mEq/L 29 28 30(H)  Calcium 8.4 - 10.4 mg/dL 9.3 8.7 9.8  Total Protein 6.4 - 8.3 g/dL 6.5 6.4 7.0  Total Bilirubin 0.20 - 1.20 mg/dL 1.31(H) 1.59(H) 0.94  Alkaline Phos 40 - 150 U/L 78 89 86  AST 5 - 34 U/L _0 ALT 0 - 55 U/L _1 Pathology report: Diagnosis 05/17/2014 1. Colon, segmental resection for tumor, sigmoid, open end proximal - INVASIVE ADENOCARCINOMA WITH EXTRACELLULAR MUCIN, INVADING THROUGH THE MUSCULARIS PROPRIA INTO PERICOLONIC FATTY TISSUE. - THIRTEEN LYMPH NODES, NEGATIVE FOR METASTATIC CARCINOMA (0/13). - MULTIPLE DIVERTICULA. - RESECTION MARGINS, NEGATIVE FOR ATYPIA OR MALIGNANCY. - PLEASE SEE ONCOLOGY TEMPLATE FOR DETAILS. 2. Colon, resection margin (donut), sigmoid - BENIGN COLONIC MUCOSA, NO EVIDENCE OF DYSPLASIA OR MALIGNANCY. Specimen: Left colon. Procedure: Segmental resection. Tumor site: Sigmoid colon. Specimen integrity: Intact. Macroscopic intactness of mesorectum: N/A Macroscopic tumor perforation: No Invasive tumor: Invasive adenocarcinoma with extracellular mucin. Maximum size: 8.0 cm, gross measurement. Histologic type(s): Invasive adenocarcinoma with  extracellular mucin. Histologic grade and differentiation: G1: well differentiated/low grade Type of polyp in which invasive carcinoma arose: Villous adenoma. Microscopic extension of invasive tumor: Invading through the muscularis propria into pericolonic fatty tissue. Lymph-Vascular invasion: Not identified. Peri-neural invasion: Not identified. Tumor deposit(s) (discontinuous extramural extension): None. Resection margins: Negative. Proximal margin: 9.6 cm. Distal margin: 9.6 cm. Mesenteric margin (sigmoid and transverse): 6 cm. Treatment effect (neo-adjuvant therapy): No Additional polyp(s): N/A Non-neoplastic findings: Prominent Crohn-like reaction around the tumor. Lymph nodes: number examined 13; number positive: 0 Pathologic Staging: pT3, pN0, pMX  Diagnosis 12/08/2014  Colon, segmental resection for tumor, right colon MODERATELY DIFFERENTIATED ADENOCARCINOMA OF THE COLON (3.8 CM) THE TUMOR PENETRATES TO THE SURFACE OF THE VISCERAL PERITONEUM (T4A) LYMPHOVASCULAR INVASION IS IDENTIFIED MARGINS OF RESECTION ARE NEGATIVE FOR TUMOR NINTEEN BENIGN LYMPH NODE (0/19) ONE TUMOR DEPOSITE IS IDENTIFIED ([place]) Specimen: Colon Procedure: Segmental resection Tumor site: Ascending colon Specimen integrity: Intact Macroscopic intactness of mesorectum: Not applicable: Macroscopic tumor perforation: The tumor invades through the muscularis propria penatrate the surface of serosa Invasive tumor: Maximum size: 3.8 Histologic type(s): Adenocarcinoma G2 G2: moderately differentiated/low grade Type of polyp in which invasive carcinoma arose: Tubular adenoma Microscopic extension of invasive tumor: Serosa Lymph-Vascular invasion: Present Peri-neural invasion: Negative Tumor deposit(s) (discontinuous extramural extension): Present Resection margins: Proximal margin: 12 cm Distal margin: 23 cm Circumferential (radial) (posterior ascending, posterior descending; lateral and posterior  mid-rectum; and entire lower 1/3 rectum):3.5 cm Mesenteric margin (sigmoid and transverse): NA Distance closest margin (if all above margins negative): _ Treatment effect (neo-adjuvant therapy): Negative Additional polyp(s): Negative Non-neoplastic findings: Unremarkabel Lymph nodes: number examined 19; number positive: 0 Pathologic Staging: T4a, N1c, M_  MMR: NORMAL MSI: STABLE  Mismatch Repair (MMR) Protein Immunohistochemistry (IHC) IHC Expression  Result: MLH1: Preserved nuclear expression (greater 50% tumor expression) MSH2: Preserved nuclear expression (greater 50% tumor expression) MSH6: Preserved nuclear expression (greater 50% tumor expression) PMS2: Preserved nuclear expression (greater 50% tumor expression) * Internal control demonstrates intact nuclear expression Interpretation: NORMAL   RADIOGRAPHIC STUDIES: I have personally reviewed the radiological images as listed and agreed with the findings in the report.  CT chest, abdomen and pelvis with contrast 09/16/2014 IMPRESSION: Annular constricting masslike soft tissue density involving the cecum measuring 2.3 x 3.3 cm, highly suspicious for metachronous primary colon carcinoma. Colonoscopy is recommended for further evaluation.  Increased size of 1.5 cm right pericolonic soft tissue nodule or lymph node, consistent with metastatic disease. Small less than 1 cm right lower quadrant mesenteric lymph nodes remain stable; local metastatic disease cannot be excluded.  No liver metastases identified.  Two new ill-defined pulmonary nodules in right upper lobe and left lower lobe, suspicious for pulmonary metastases.  Stable incidental findings including 4.0 cm ascending thoracic aortic aneurysm, left thyroid lobe nodule, and cholelithiasis  CT chest 12/29/2014  IMPRESSION: 1. Interval stability of previously described right upper lobe 1.1 cm pulmonary nodule, which has been stable since 09/19/2012, suggesting  a benign etiology. 2. Interval decreased size of previously described left lower lobe pulmonary nodule, suggesting a resolving inflammatory lesion. 3. No new significant pulmonary nodules. No evidence of metastatic disease in the chest. 4. Stable multinodular goiter, tiny hiatal hernia and cholelithiasis .  ASSESSMENT & PLAN:  82 year old Caucasian female, with past medical history of colon diverticulosis and arthritis, but otherwise healthy and active, who was found to have a large sigmoid tumor, status post sigmoidectomy.  1. Sigmoid colon adenocarcinoma, pT3N0M0, stage II, G1, MSI stable, ascending colon adenocarcinoma, pT4aN1cM0, stage IIIB, MSI stable, G2 -She had multifocal (2) colon cancer status post complete surgical resection,  -I reviewed her recent surgical pathology results in great details with her. The right colon cancer has several high-risk features, including stage IIIB, T4a lesion, tumor deposits, lymphovascular invasion, her risk of colon cancer recurrence is high.  -She is currently on adjuvant Xeloda, plan for a total of 8 cycles  -She did not tolerate first cycle Xeloda well, with severe diarrhea, anorexia and weight loss.  We decreased her dose on second cycle, and she has been tolerating better. -given her diarrhea, I will start her next cycle Xeloda in 7-10 days, until her diarrhea improves. -if her tolerance became an issue again, I'll stop her adjuvant Xeloda.  2. Diarrhea and anorexia  -secondary to chemotherapy -I reviewed the management I diarrhea, she knows to use Imodium and Lomotil as needed.  3.  Peripheral neuropathy, G 1 - noticed from second cycle of Xeloda, stable overall. We'll monitor closely.  4. Hand-foot syndrome from Xeloda -she'll continue use urea cream and moisturizer -Continue watch carefully  5. GERD -she'll continue Protonix.  4.Lung nodules  -She has two small right lung nodules which were discovered on the CT scan in 09/2012,  RUL  nodule slightly bigger on recent CT -She has been follow-up with Dr. Cyndia Bent.  -she never smoked, low risk for lung cancer -Repeat his CT chest on 12/29/2014 showed stable right upper lobe pulmonary nodule, and interval decreased size of left lower lobe nodule. No other new lesions. -given the stability of the RUL nodule over 2 years, this is likely a benign lesion   Plan -Lab results reviewed with her -start her next cycle (6) 5 Xeloda until diarrhea resolves, in 7-10 days . -She'll use  Imodium and Lomotil as needed for diarrhea -I'll see her back in 4 weeks.  All questions were answered. The patient knows to call the clinic with any problems, questions or concerns.  I spent 20 minutes counseling the patient face to face. The total time spent in the appointment was 25  minutes and more than 50% was on counseling.     Truitt Merle, MD 05/23/2015

## 2015-05-23 NOTE — Telephone Encounter (Signed)
per pof to sch pt appt-gave pt copy of avs °

## 2015-06-01 ENCOUNTER — Inpatient Hospital Stay: Admission: RE | Admit: 2015-06-01 | Payer: Self-pay | Source: Ambulatory Visit

## 2015-06-01 ENCOUNTER — Ambulatory Visit: Payer: PPO | Admitting: Surgery

## 2015-06-02 ENCOUNTER — Telehealth: Payer: Self-pay | Admitting: *Deleted

## 2015-06-02 MED FILL — CAPECITABINE 500 MG TABLET: 500 | 21 days supply | Qty: 56 | Fill #0

## 2015-06-02 NOTE — Telephone Encounter (Signed)
Received vm call from [facility] regarding xeloda script.  Pt is waiting & # 26 was on script but pt needs # 56.  Reviewed script & MD note & called pharmacy & OK # 56 for pt.

## 2015-06-15 ENCOUNTER — Ambulatory Visit
Admission: RE | Admit: 2015-06-15 | Discharge: 2015-06-15 | Disposition: A | Discharge status: Home / Self Care | Payer: PPO | Source: Ambulatory Visit | Attending: Surgery | Admitting: Surgery

## 2015-06-15 ENCOUNTER — Ambulatory Visit (INDEPENDENT_AMBULATORY_CARE_PROVIDER_SITE_OTHER): Payer: PPO | Admitting: Surgery

## 2015-06-15 ENCOUNTER — Encounter: Payer: Self-pay | Admitting: Surgery

## 2015-06-15 VITALS — BP 153/90 | HR 73 | Resp 16 | Ht 63.5 in | Wt 136.2 lb

## 2015-06-15 DIAGNOSIS — I712 Thoracic aortic aneurysm, without rupture, unspecified: Secondary | ICD-10-CM

## 2015-06-15 DIAGNOSIS — R911 Solitary pulmonary nodule: Secondary | ICD-10-CM

## 2015-06-15 DIAGNOSIS — R918 Other nonspecific abnormal finding of lung field: Secondary | ICD-10-CM | POA: Diagnosis not present

## 2015-06-15 DIAGNOSIS — D381 Neoplasm of uncertain behavior of trachea, bronchus and lung: Secondary | ICD-10-CM | POA: Diagnosis not present

## 2015-06-16 ENCOUNTER — Encounter: Payer: Self-pay | Admitting: Surgery

## 2015-06-16 NOTE — Progress Notes (Signed)
HPI:  Heidi Marquez returns for follow up of a small ascending aortic aneurysm and a RUL lung nodule. She underwent sigmoid colon resection for colon cancer on 05/17/2014 and then was diagnosed with a second primary colon cancer in the ascending colon and underwent a second colon resection.  She has had a small RUL spiculated nodule that was diagnosed in 09/2012 at which time it was 14 x 9 x 12 mm. A PET scan at that time showed no hypermetabolic activity and we have been following it since. It has not changed significantly in size on successive CT scans. She has felt fine with no cough or sputum production, no hemoptysis. She denies fever. Appetite has been good and she denies weight loss.  Current Outpatient Prescriptions  Medication Sig Dispense Refill  . alendronate (FOSAMAX) 70 MG tablet Take 70 mg by mouth every 7 (seven) days. Tuesdays. Take with a full glass of water on an empty stomach.    . Ascorbic Acid (VITAMIN C) 1000 MG tablet Take 1,000 mg by mouth daily.    . Biotin 1000 MCG tablet Take 1,000 mcg by mouth daily.     Marland Kitchen CALCIUM & MAGNESIUM CARBONATES PO Take 1 tablet by mouth daily.    . capecitabine (XELODA) 500 MG tablet Take 2 tablets (1,000 mg total) by mouth 2 (two) times daily after a meal. Take for 14 days then off for 7 days 26 tablet 0  . cholecalciferol (VITAMIN D) 1000 UNITS tablet Take 1,000 Units by mouth daily.    . diphenoxylate-atropine (LOMOTIL) 2.5-0.025 MG tablet Take 1-2 tablets by mouth 4 (four) times daily as needed for diarrhea or loose stools. 30 tablet 2  . glucosamine-chondroitin 500-400 MG tablet Take 1 tablet by mouth daily.     . Homeopathic Products ([employer] LEG CRAMPS PAIN RELIEF PO) Take 1 tablet by mouth daily as needed (for leg cramps).    Marland Kitchen HYDROcodone-acetaminophen (NORCO/VICODIN) 5-325 MG tablet Take 1-2 tablets by mouth every 6 (six) hours as needed. 30 tablet 0  . hydrocortisone 1 % lotion Apply 1 application topically 2 (two) times daily. Apply to  hands 118 mL 0  . ibuprofen (ADVIL,MOTRIN) 200 MG tablet Take 200 mg by mouth daily as needed for moderate pain.     Marland Kitchen loratadine (CLARITIN) 10 MG tablet Take 10 mg by mouth daily.    . Multiple Vitamin (MULTIVITAMIN) tablet Take 1 tablet by mouth daily.    . potassium chloride SA (K-DUR,KLOR-CON) 20 MEQ tablet Take 1 tablet (20 mEq total) by mouth daily. 20 tablet 1  . urea (CARMOL) 10 % cream Apply topically 2 (two) times daily. Apply to hand twice daily 142 g 0  . vitamin B-12 (CYANOCOBALAMIN) 1000 MCG tablet Take 1,000 mcg by mouth daily.      No current facility-administered medications for this visit.     Physical Exam: BP 153/90 mmHg  Pulse 73  Resp 16  Ht 5' 3.5" (1.613 m)  Wt 136 lb 3.2 oz (61.78 kg)  BMI 23.75 kg/m2  SpO2 98% She looks well  Lungs are clear  There is no cervical or supraclavicular adenopathy. The left thyroid lobe is slighyly prominent.  Diagnostic Tests:  CLINICAL DATA: Followup bilateral pulmonary nodules. Personal history of colon cancer for which the patient underwent partial colectomy in 2016.  EXAM: CT CHEST WITHOUT CONTRAST  TECHNIQUE: Multidetector CT imaging of the chest was performed following the standard protocol without IV contrast.  COMPARISON: 12/29/2014, 09/16/2014, 04/12/2014, 02/10/2014,  08/07/2013, 04/01/2013, 12/31/2012, 09/19/2012. PET-CT 10/07/2012.  FINDINGS: Cardiovascular: Heart size upper normal slightly enlarged, unchanged. Aortic and mitral annular calcification, unchanged. No visible coronary atherosclerosis. Very small, insignificant pericardial effusion in the superior recess, unchanged. Mild atherosclerosis involving the thoracic and upper abdominal aorta with ectasia of the ascending thoracic aorta up to approximately 4.3 cm, not significantly changed dating back to August, 2014.  Mediastinum/Lymph Nodes: No pathologically enlarged mediastinal, hilar or axillary lymph nodes. No mediastinal  masses. Normal-appearing esophagus. Marked enlargement of the left lobe of thyroid gland with multiple nodules, some of which are partially calcified, deviating the trachea to the right, unchanged dating back to the August, 2014.  Lungs/Pleura: Emphysematous changes throughout both lungs. Approximate 1.3 x 1.2 cm nodule in the posterior right upper lobe adjacent to the major fissure, not significantly changed since August, 2014. Sub 5 mm nodule anteriorly in the right upper lobe, unchanged dating back to August, 2014. No new or enlarging nodules in either lung. Parenchymal scarring involving the lingula and the right middle lobe. No confluent airspace consolidation. No pleural effusions.  Upper abdomen: Descending colon diverticulosis without evidence acute diverticulitis. Visualized upper abdomen otherwise unremarkable for the unenhanced technique.  Musculoskeletal: Degenerative disc disease and spondylosis throughout the thoracic spine with exaggeration of the usual thoracic kyphosis. Mild osseous demineralization. No acute abnormality.  IMPRESSION: 1. Stable right lung nodules dating back to August, 2014. Their stability for greater than 2 years and the lack of hypermetabolic activity on the prior PET indicates benignity. 2. Stable mild ectasia of the ascending thoracic aorta with maximum diameter approximating 4.3 cm. Recommend annual imaging followup by CTA or MRA. This recommendation follows 2010 ACCF/AHA/AATS/ACR/ASA/SCA/SCAI/SIR/STS/SVM Guidelines for the Diagnosis and Management of Patients with Thoracic Aortic Disease. Circulation. 2010; 121: [id number]. 3. COPD/emphysema. No acute cardiopulmonary disease. 4. Stable multinodular goiter involving the left lobe of the thyroid gland.   Electronically Signed  By: Evangeline Dakin M.D.  On: 06/15/2015 16:24  Impression:  The right lung nodules are stable since 09/2012 and likely benign. The ascending aortic  aneurysm is 4.3 cm and stable. Her more pressing issue is her colon cancer for which she is taking oral chemotherapy.  Plan:  I will see her back in one year with a CTA of the chest.   Gaye Pollack, MD [facility] [phone]

## 2015-06-27 ENCOUNTER — Telehealth: Payer: Self-pay | Admitting: Hematology

## 2015-06-27 ENCOUNTER — Other Ambulatory Visit (HOSPITAL_BASED_OUTPATIENT_CLINIC_OR_DEPARTMENT_OTHER): Payer: PPO

## 2015-06-27 ENCOUNTER — Encounter: Payer: Self-pay | Admitting: Hematology

## 2015-06-27 ENCOUNTER — Ambulatory Visit (HOSPITAL_BASED_OUTPATIENT_CLINIC_OR_DEPARTMENT_OTHER): Payer: PPO | Admitting: Hematology

## 2015-06-27 VITALS — BP 142/55 | HR 70 | Temp 98.0°F | Resp 18 | Ht 63.5 in | Wt 136.3 lb

## 2015-06-27 DIAGNOSIS — C187 Malignant neoplasm of sigmoid colon: Secondary | ICD-10-CM | POA: Diagnosis not present

## 2015-06-27 DIAGNOSIS — C182 Malignant neoplasm of ascending colon: Secondary | ICD-10-CM | POA: Diagnosis not present

## 2015-06-27 DIAGNOSIS — K219 Gastro-esophageal reflux disease without esophagitis: Secondary | ICD-10-CM

## 2015-06-27 DIAGNOSIS — C186 Malignant neoplasm of descending colon: Secondary | ICD-10-CM

## 2015-06-27 DIAGNOSIS — R630 Anorexia: Secondary | ICD-10-CM | POA: Diagnosis not present

## 2015-06-27 DIAGNOSIS — R197 Diarrhea, unspecified: Secondary | ICD-10-CM

## 2015-06-27 DIAGNOSIS — R911 Solitary pulmonary nodule: Secondary | ICD-10-CM

## 2015-06-27 DIAGNOSIS — L271 Localized skin eruption due to drugs and medicaments taken internally: Secondary | ICD-10-CM

## 2015-06-27 DIAGNOSIS — G620 Drug-induced polyneuropathy: Secondary | ICD-10-CM

## 2015-06-27 LAB — COMPREHENSIVE METABOLIC PANEL
ALT: 17 U/L (ref 0–55)
ANION GAP: 5 meq/L (ref 3–11)
AST: 24 U/L (ref 5–34)
Albumin: 3.6 g/dL (ref 3.5–5.0)
Alkaline Phosphatase: 90 U/L (ref 40–150)
BILIRUBIN TOTAL: 1.01 mg/dL (ref 0.20–1.20)
BUN: 17.9 mg/dL (ref 7.0–26.0)
CHLORIDE: 107 meq/L (ref 98–109)
CO2: 32 meq/L — AB (ref 22–29)
Calcium: 9.5 mg/dL (ref 8.4–10.4)
Creatinine: 0.9 mg/dL (ref 0.6–1.1)
EGFR: 57 mL/min/{1.73_m2} — AB (ref >90)
Glucose: 104 mg/dl (ref 70–140)
POTASSIUM: 4.4 meq/L (ref 3.5–5.1)
SODIUM: 145 meq/L (ref 136–145)
TOTAL PROTEIN: 6.5 g/dL (ref 6.4–8.3)

## 2015-06-27 LAB — CBC WITH DIFFERENTIAL/PLATELET
BASO%: 0.3 % (ref 0.0–2.0)
BASOS ABS: 0 10*3/uL (ref 0.0–0.1)
EOS ABS: 0.2 10*3/uL (ref 0.0–0.5)
EOS%: 3.5 % (ref 0.0–7.0)
HCT: 36.9 % (ref 34.8–46.6)
HGB: 12.5 g/dL (ref 11.6–15.9)
LYMPH%: 14.6 % (ref 14.0–49.7)
MCH: 33.5 pg (ref 25.1–34.0)
MCHC: 33.9 g/dL (ref 31.5–36.0)
MCV: 98.9 fL (ref 79.5–101.0)
MONO#: 0.6 10*3/uL (ref 0.1–0.9)
MONO%: 9.5 % (ref 0.0–14.0)
NEUT#: 4.2 10*3/uL (ref 1.5–6.5)
NEUT%: 72.1 % (ref 38.4–76.8)
PLATELETS: 156 10*3/uL (ref 145–400)
RBC: 3.73 10*6/uL (ref 3.70–5.45)
RDW: 15.1 % — ABNORMAL HIGH (ref 11.2–14.5)
WBC: 5.8 10*3/uL (ref 3.9–10.3)
lymph#: 0.8 10*3/uL — ABNORMAL LOW (ref 0.9–3.3)

## 2015-06-27 NOTE — Telephone Encounter (Signed)
left msg for pt confirming july apt

## 2015-06-27 NOTE — Progress Notes (Signed)
- [place]  [phone] [phone]  Clinic Follow Up Note   Patient Care Team: Prince Solian, MD as PCP - General (Internal Medicine) Gaye Pollack, MD as Consulting Physician (Cardiothoracic Surgery) Gatha Mayer, MD as Consulting Physician (Gastroenterology) Leighton Ruff, MD as Consulting Physician (General Surgery) Tania Ade, RN as Registered Nurse (Medical Oncology) 06/27/2015  CHIEF COMPLAINTS:  Follow-up of colon cancer  Oncology History   Cancer of left colon [facility])   Staging form: Colon and Rectum, AJCC 7th Edition     Pathologic stage from 05/17/2014: Stage IIA (T3, N0, cM0) - Signed by Truitt Merle, MD on 02/21/2015 Cancer of right colon [facility])   Staging form: Colon and Rectum, AJCC 7th Edition     Pathologic stage from 12/08/2014: Stage IIIB (T4a, N1a, cM0) - Signed by Truitt Merle, MD on 02/21/2015       Cancer of right colon ([place])   04/09/2014 Procedure Colonoscopy per Dr. Silvano Rusk: Large fleshy mass sigmoid colon 25 cm from anus. Could not pass through.   04/09/2014 Tumor Marker CEA =11.7   04/12/2014 Imaging CT C/A/P:sigmoid colon mass;several small areas of soft tissue nodularity in peritoneal cavity; small attenuation structure posterior right hepatic lobe; 2 stable nodules in lungs dating back to 2014. No acute findings.   05/17/2014 Initial Diagnosis Colon cancer-presented with heme + stool and more frequent BMs   05/17/2014 Surgery Robot assisted sigmoidectomy   05/17/2014 Pathologic Stage pT3,pN0,pMX--Grade 1--0/13 nodes +--MMR normal   05/17/2014 Clinical Stage Stage II   12/08/2014 Surgery right hemicolectomy    12/08/2014 Pathology Results right colon adenocarcinoma, pT4aN1c, 19 nodes were negative, LVI(+), MMR normal    01/17/2015 -  Chemotherapy adjuvant capecitabine 1524m q12h, 2 weeks on and 1 week off. Second cycle was postponed to 02/22/2015 due to side effects and dose reduced to 10033mq12h/    HISTORY OF PRESENTING  ILLNESS:  Heidi [id number].o. female is here because of recently diagnosed a stage II sigmoid colon cancer.  She had annual physical with her PCP in early Feb this year, and stool OB (+), no overt GI bleeding, CBC was normal. She had noticed some loose BM before that, but no nausea, loss of appetite, weight loss or other symoptoms. Colonoscopy showed a large mass in the sigmoid colon 25 cm from anus. Biopsy was taken which showed tubulovillous adenoma, no malignancy. She was referred to surgeon Dr. ThMarcello Mooresnd underwent robotic-assisted sigmoidectomy on 4/4. She tolerated the procedure well, and was discharged home afterwards.  She has recovered well from the surgery 3 weeks ago, she has mild fatigue, but otherwise doing very well without other complains. She had 2-3 episodes of bleeding after the surgery, which was related to her herromods. She is eating well. 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   She has chronic knee pain she takes ibuprofen once daily.    CURRENT THERAPY: adjuvant chemotherapy capecitabine 150082m12h, day 1-14, every 21 days, started on 01/17/2015, second cycle was postponed to 1/10 due to side effects and dose reduced to 1000m64m2h, she received a total of 6 cycles, stopped due to worsening side effects   INTERIM HISTORY: Mrs. WebsMartinovichurns for follow-up. She started cycle 6 Xeloda after her diarrhea resolved. Overall she had worsening diarrhea with this cycle, and developed severe nausea and vomiting on day 11 of this cycle chemo. She was [place] of town at the [employer]  she decided to stop the Xeloda. Her nausea resolved. Her diarrhea has slowly improving, she is taking Imodium. She denies any pain, or other symptoms. His energy has improved some also. He otherwise feels well, he is a caregiver to her husband, who uses oxygen continuously.   MEDICAL HISTORY:  Past Medical History  Diagnosis Date  . Diverticulosis of  colon     severe in sigmoid but diffusley throughout colon  on 2009 colonoscopy  . Internal hemorrhoids   . Hematochezia 2009  . Benign esophageal stricture 04/2007    EGD with dilatation.  Marland Kitchen GERD (gastroesophageal reflux disease)   . HH (hiatus hernia)   . Macular degeneration   . IBS (irritable bowel syndrome)   . Epistaxis ~ 2005    required cauterization twice.   . Reflux esophagitis   . Allergy   . Cataract     ight eye starting  . Arthritis     knees  . History of skin cancer   . Colon cancer ([place])      COLON CANCER / HX SKIN CANCER    SURGICAL HISTORY: Past Surgical History  Procedure Laterality Date  . Abdominal hysterectomy      partial with the bladder tack  . Bladder tack    . Retinal laser surgery    . Basal cell resection  08/2011, 11/2011    initially biopsy 08/3011.  repeat excision right nose with grafting from the skin behind the right ear  . Colonoscopy    . Esophagogastroduodenoscopy    . Upper gastrointestinal endoscopy    . Inguinal hernia repair  early 1990s    right  . Colonoscopy with propofol N/A 09/30/2014    Procedure: COLONOSCOPY WITH PROPOFOL;  Surgeon: Milus Banister, MD;  Location: [facility];  Service: Endoscopy;  Laterality: N/A;  . Colon surgery  APRIL 2016    SIGMOIDECTOMY    SOCIAL HISTORY: Social History   Social History  . Marital Status: Married    Spouse Name: N/A  . Number of Children: N/A  . Years of Education: N/A   Occupational History  . Not on file.   Social History Main Topics  . Smoking status: Passive Smoke Exposure - Never Smoker  . Smokeless tobacco: Never Used  . Alcohol Use: No  . Drug Use: No  . Sexual Activity: Not on file   Other Topics Concern  . Not on file   Social History Narrative   Married, husband is disabled (physical and dementia) and she is caregiver   Has #1 child (daughter) living. Son died of MI at age 70   Worked at [employer] before she retired   Has #3 cats    FAMILY  HISTORY: Family History  Problem Relation Age of Onset  . Colon cancer Paternal Grandfather 35    colon cancer   . Esophageal cancer Neg Hx   . Rectal cancer Neg Hx   . Stomach cancer Neg Hx   . Stroke Father   . Heart attack Son 75    died of heart attack     ALLERGIES:  is allergic to penicillins; sulfonamide derivatives; and amoxicillin-pot clavulanate.  MEDICATIONS:  Current Outpatient Prescriptions  Medication Sig Dispense Refill  . alendronate (FOSAMAX) 70 MG tablet Take 70 mg by mouth every 7 (seven) days. Tuesdays. Take with a full glass of water on an empty stomach.    . Ascorbic Acid (VITAMIN C) 1000 MG tablet Take 1,000 mg by mouth daily.    Marland Kitchen  Biotin 1000 MCG tablet Take 1,000 mcg by mouth daily.     Marland Kitchen CALCIUM & MAGNESIUM CARBONATES PO Take 1 tablet by mouth daily.    . cholecalciferol (VITAMIN D) 1000 UNITS tablet Take 1,000 Units by mouth daily.    . diphenoxylate-atropine (LOMOTIL) 2.5-0.025 MG tablet Take 1-2 tablets by mouth 4 (four) times daily as needed for diarrhea or loose stools. 30 tablet 2  . glucosamine-chondroitin 500-400 MG tablet Take 1 tablet by mouth daily.     . Homeopathic Products ([employer] LEG CRAMPS PAIN RELIEF PO) Take 1 tablet by mouth daily as needed (for leg cramps).    Marland Kitchen HYDROcodone-acetaminophen (NORCO/VICODIN) 5-325 MG tablet Take 1-2 tablets by mouth every 6 (six) hours as needed. 30 tablet 0  . hydrocortisone 1 % lotion Apply 1 application topically 2 (two) times daily. Apply to hands 118 mL 0  . ibuprofen (ADVIL,MOTRIN) 200 MG tablet Take 200 mg by mouth daily as needed for moderate pain.     Marland Kitchen loratadine (CLARITIN) 10 MG tablet Take 10 mg by mouth daily.    . Multiple Vitamin (MULTIVITAMIN) tablet Take 1 tablet by mouth daily.    . potassium chloride SA (K-DUR,KLOR-CON) 20 MEQ tablet Take 1 tablet (20 mEq total) by mouth daily. 20 tablet 1  . urea (CARMOL) 10 % cream Apply topically 2 (two) times daily. Apply to hand twice daily 142 g 0  .  vitamin B-12 (CYANOCOBALAMIN) 1000 MCG tablet Take 1,000 mcg by mouth daily.     . capecitabine (XELODA) 500 MG tablet Take 2 tablets (1,000 mg total) by mouth 2 (two) times daily after a meal. Take for 14 days then off for 7 days (Patient not taking: Reported on 06/27/2015) 26 tablet 0   No current facility-administered medications for this visit.    REVIEW OF SYSTEMS:   Constitutional: Denies fevers, chills or abnormal night sweats Eyes: Denies blurriness of vision, double vision or watery eyes Ears, nose, mouth, throat, and face: Denies mucositis or sore throat Respiratory: Denies cough, dyspnea or wheezes Cardiovascular: Denies palpitation, chest discomfort or lower extremity swelling Gastrointestinal:  Denies nausea, heartburn or change in bowel habits Skin: Denies abnormal skin rashes Lymphatics: Denies new lymphadenopathy or easy bruising Neurological:Denies numbness, tingling or new weaknesses Behavioral/Psych: Mood is stable, no new changes  All other systems were reviewed with the patient and are negative.  PHYSICAL EXAMINATION: ECOG PERFORMANCE STATUS: 1  Filed Vitals:   06/27/15 1308  BP: 142/55  Pulse: 70  Temp: 98 F (36.7 C)  Resp: 18   Filed Weights   06/27/15 1308  Weight: 136 lb 4.8 oz (61.825 kg)    GENERAL:alert, no distress and comfortable SKIN: skin color, texture, turgor are normal, no rashes or significant lesions except skin redness on palms and bottom of her feet, with skin peeling, a few skin rashes on her hands and arms  EYES: normal, conjunctiva are pink and non-injected, sclera clear OROPHARYNX:no exudate, no erythema and lips, buccal mucosa, and tongue normal  NECK: supple, thyroid normal size, non-tender, without nodularity LYMPH:  no palpable lymphadenopathy in the cervical, axillary or inguinal LUNGS: clear to auscultation and percussion with normal breathing effort HEART: regular rate & rhythm and no murmurs and no lower extremity  edema ABDOMEN:abdomen soft, surgical incision and to the low abdomen is well healed. non-tender and normal bowel sounds Musculoskeletal:no cyanosis of digits and no clubbing  PSYCH: alert & oriented x 3 with fluent speech NEURO: no focal motor/sensory deficits  LABORATORY  DATA:  I have reviewed the data as listed CBC Latest Ref Rng 06/27/2015 05/23/2015 05/02/2015  WBC 3.9 - 10.3 10e3/uL 5.8 7.1 7.2  Hemoglobin 11.6 - 15.9 g/dL 12.5 12.2 11.6  Hematocrit 34.8 - 46.6 % 36.9 36.3 33.8(L)  Platelets 145 - 400 10e3/uL 156 144(L) 177   CMP Latest Ref Rng 06/27/2015 05/23/2015 05/02/2015  Glucose 70 - 140 mg/dl 104 92 73  BUN 7.0 - 26.0 mg/dL 17.9 17.7 13.2  Creatinine 0.6 - 1.1 mg/dL 0.9 0.9 0.8  Sodium 136 - 145 mEq/L 145 141 142  Potassium 3.5 - 5.1 mEq/L 4.4 4.3 3.4(L)  CO2 22 - 29 mEq/L 32(H) 29 28  Calcium 8.4 - 10.4 mg/dL 9.5 9.3 8.7  Total Protein 6.4 - 8.3 g/dL 6.5 6.5 6.4  Total Bilirubin 0.20 - 1.20 mg/dL 1.01 1.31(H) 1.59(H)  Alkaline Phos 40 - 150 U/L 90 78 89  AST 5 - 34 U/L '24 25 22  ' ALT 0 - 55 U/L '17 16 14     ' Pathology report: Diagnosis 05/17/2014 1. Colon, segmental resection for tumor, sigmoid, open end proximal - INVASIVE ADENOCARCINOMA WITH EXTRACELLULAR MUCIN, INVADING THROUGH THE MUSCULARIS PROPRIA INTO PERICOLONIC FATTY TISSUE. - THIRTEEN LYMPH NODES, NEGATIVE FOR METASTATIC CARCINOMA (0/13). - MULTIPLE DIVERTICULA. - RESECTION MARGINS, NEGATIVE FOR ATYPIA OR MALIGNANCY. - PLEASE SEE ONCOLOGY TEMPLATE FOR DETAILS. 2. Colon, resection margin (donut), sigmoid - BENIGN COLONIC MUCOSA, NO EVIDENCE OF DYSPLASIA OR MALIGNANCY. Specimen: Left colon. Procedure: Segmental resection. Tumor site: Sigmoid colon. Specimen integrity: Intact. Macroscopic intactness of mesorectum: N/A Macroscopic tumor perforation: No Invasive tumor: Invasive adenocarcinoma with extracellular mucin. Maximum size: 8.0 cm, gross measurement. Histologic type(s): Invasive adenocarcinoma with  extracellular mucin. Histologic grade and differentiation: G1: well differentiated/low grade Type of polyp in which invasive carcinoma arose: Villous adenoma. Microscopic extension of invasive tumor: Invading through the muscularis propria into pericolonic fatty tissue. Lymph-Vascular invasion: Not identified. Peri-neural invasion: Not identified. Tumor deposit(s) (discontinuous extramural extension): None. Resection margins: Negative. Proximal margin: 9.6 cm. Distal margin: 9.6 cm. Mesenteric margin (sigmoid and transverse): 6 cm. Treatment effect (neo-adjuvant therapy): No Additional polyp(s): N/A Non-neoplastic findings: Prominent Crohn-like reaction around the tumor. Lymph nodes: number examined 13; number positive: 0 Pathologic Staging: pT3, pN0, pMX  Diagnosis 12/08/2014  Colon, segmental resection for tumor, right colon MODERATELY DIFFERENTIATED ADENOCARCINOMA OF THE COLON (3.8 CM) THE TUMOR PENETRATES TO THE SURFACE OF THE VISCERAL PERITONEUM (T4A) LYMPHOVASCULAR INVASION IS IDENTIFIED MARGINS OF RESECTION ARE NEGATIVE FOR TUMOR NINTEEN BENIGN LYMPH NODE (0/19) ONE TUMOR DEPOSITE IS IDENTIFIED ([place]) Specimen: Colon Procedure: Segmental resection Tumor site: Ascending colon Specimen integrity: Intact Macroscopic intactness of mesorectum: Not applicable: Macroscopic tumor perforation: The tumor invades through the muscularis propria penatrate the surface of serosa Invasive tumor: Maximum size: 3.8 Histologic type(s): Adenocarcinoma G2 G2: moderately differentiated/low grade Type of polyp in which invasive carcinoma arose: Tubular adenoma Microscopic extension of invasive tumor: Serosa Lymph-Vascular invasion: Present Peri-neural invasion: Negative Tumor deposit(s) (discontinuous extramural extension): Present Resection margins: Proximal margin: 12 cm Distal margin: 23 cm Circumferential (radial) (posterior ascending, posterior descending; lateral and posterior  mid-rectum; and entire lower 1/3 rectum):3.5 cm Mesenteric margin (sigmoid and transverse): NA Distance closest margin (if all above margins negative): _ Treatment effect (neo-adjuvant therapy): Negative Additional polyp(s): Negative Non-neoplastic findings: Unremarkabel Lymph nodes: number examined 19; number positive: 0 Pathologic Staging: T4a, N1c, M_  MMR: NORMAL MSI: STABLE  Mismatch Repair (MMR) Protein Immunohistochemistry (IHC) IHC Expression Result: MLH1: Preserved nuclear expression (greater 50% tumor expression) MSH2:  Preserved nuclear expression (greater 50% tumor expression) MSH6: Preserved nuclear expression (greater 50% tumor expression) PMS2: Preserved nuclear expression (greater 50% tumor expression) * Internal control demonstrates intact nuclear expression Interpretation: NORMAL   RADIOGRAPHIC STUDIES: I have personally reviewed the radiological images as listed and agreed with the findings in the report.  CT chest, abdomen and pelvis with contrast 09/16/2014 IMPRESSION: Annular constricting masslike soft tissue density involving the cecum measuring 2.3 x 3.3 cm, highly suspicious for metachronous primary colon carcinoma. Colonoscopy is recommended for further evaluation.  Increased size of 1.5 cm right pericolonic soft tissue nodule or lymph node, consistent with metastatic disease. Small less than 1 cm right lower quadrant mesenteric lymph nodes remain stable; local metastatic disease cannot be excluded.  No liver metastases identified.  Two new ill-defined pulmonary nodules in right upper lobe and left lower lobe, suspicious for pulmonary metastases.  Stable incidental findings including 4.0 cm ascending thoracic aortic aneurysm, left thyroid lobe nodule, and cholelithiasis  CT chest 12/29/2014  IMPRESSION: 1. Interval stability of previously described right upper lobe 1.1 cm pulmonary nodule, which has been stable since 09/19/2012, suggesting  a benign etiology. 2. Interval decreased size of previously described left lower lobe pulmonary nodule, suggesting a resolving inflammatory lesion. 3. No new significant pulmonary nodules. No evidence of metastatic disease in the chest. 4. Stable multinodular goiter, tiny hiatal hernia and cholelithiasis .  ASSESSMENT & PLAN:  82 year old Caucasian female, with past medical history of colon diverticulosis and arthritis, but otherwise healthy and active, who was found to have a large sigmoid tumor, status post sigmoidectomy.  1. Sigmoid colon adenocarcinoma, pT3N0M0, stage II, G1, MSI stable, ascending colon adenocarcinoma, pT4aN1cM0, stage IIIB, MSI stable, G2 -She had multifocal (2) colon cancer status post complete surgical resection,  -I reviewed her recent surgical pathology results in great details with her. The right colon cancer has several high-risk features, including stage IIIB, T4a lesion, tumor deposits, lymphovascular invasion, her risk of colon cancer recurrence is high.  -She is currently on adjuvant Xeloda, plan for a total of 8 cycles, and she has completed 6 cycles. Due to her worsening diarrhea, and episodes of severe nausea, I'll cancel the next 2 cycle chemotherapy. -we discussed colon cancer surveillance. She had a CT chest last week by Dr. Cyndia Bent, and obtain a CT abdomen and pelvis with IV contrast before next visit in 2 months. -She is due for a repeat colonoscopy in August 2017, I'll refer her to Dr. Carlean Purl on next visit. -Her tumor marker CEA has been normal. I reviewed her lab results from today, her CBC and CMP are unremarkable.  2. Diarrhea and anorexia  -secondary to chemotherapy -I reviewed the management I diarrhea, she knows to use Imodium and Lomotil as needed. -will likely resolve on since she is done with chemotherapy.  3.  Peripheral neuropathy, G 1 - noticed from second cycle of Xeloda, stable overall. We'll monitor closely.  4. Hand-foot syndrome  from Xeloda -she'll continue use urea cream and moisturizer -Continue watch carefully  5. GERD -she'll continue Protonix.  4.Lung nodules  -She has two small right lung nodules which were discovered on the CT scan in 09/2012,  RUL nodule slightly bigger on recent CT -She has been follow-up with Dr. Cyndia Bent.  -she never smoked, low risk for lung cancer -given the stability of the RUL nodule over 2 years, this is likely a benign lesion   Plan -Lab results reviewed with her -we'll cancel the next 2 cycles of Xeloda  due to the intolerance issue.. She has completed adjuvant chemotherapy. -She'll use Imodium and Lomotil as needed for diarrhea -I'll see her back in 8 weeks with a restaging CT abd/pel w contrast   All questions were answered. The patient knows to call the clinic with any problems, questions or concerns.  I spent 20 minutes counseling the patient face to face. The total time spent in the appointment was 25  minutes and more than 50% was on counseling.     Truitt Merle, MD 06/27/2015

## 2015-07-05 DIAGNOSIS — Z85038 Personal history of other malignant neoplasm of large intestine: Secondary | ICD-10-CM | POA: Diagnosis not present

## 2015-08-01 DIAGNOSIS — J302 Other seasonal allergic rhinitis: Secondary | ICD-10-CM | POA: Diagnosis not present

## 2015-08-01 DIAGNOSIS — J0100 Acute maxillary sinusitis, unspecified: Secondary | ICD-10-CM | POA: Diagnosis not present

## 2015-08-01 DIAGNOSIS — Z6823 Body mass index (BMI) 23.0-23.9, adult: Secondary | ICD-10-CM | POA: Diagnosis not present

## 2015-08-15 ENCOUNTER — Other Ambulatory Visit (HOSPITAL_BASED_OUTPATIENT_CLINIC_OR_DEPARTMENT_OTHER): Payer: PPO

## 2015-08-15 ENCOUNTER — Encounter (HOSPITAL_COMMUNITY): Payer: Self-pay

## 2015-08-15 ENCOUNTER — Ambulatory Visit (HOSPITAL_COMMUNITY)
Admission: RE | Admit: 2015-08-15 | Discharge: 2015-08-15 | Disposition: A | Discharge status: Home / Self Care | Payer: PPO | Source: Ambulatory Visit | Attending: Hematology | Admitting: Hematology

## 2015-08-15 DIAGNOSIS — C186 Malignant neoplasm of descending colon: Secondary | ICD-10-CM

## 2015-08-15 DIAGNOSIS — C786 Secondary malignant neoplasm of retroperitoneum and peritoneum: Secondary | ICD-10-CM | POA: Insufficient documentation

## 2015-08-15 DIAGNOSIS — C182 Malignant neoplasm of ascending colon: Secondary | ICD-10-CM | POA: Diagnosis not present

## 2015-08-15 DIAGNOSIS — Z9049 Acquired absence of other specified parts of digestive tract: Secondary | ICD-10-CM | POA: Insufficient documentation

## 2015-08-15 LAB — CBC WITH DIFFERENTIAL/PLATELET
BASO%: 1.2 % (ref 0.0–2.0)
BASOS ABS: 0.1 10*3/uL (ref 0.0–0.1)
EOS%: 1.9 % (ref 0.0–7.0)
Eosinophils Absolute: 0.1 10*3/uL (ref 0.0–0.5)
HEMATOCRIT: 39.2 % (ref 34.8–46.6)
HEMOGLOBIN: 12.9 g/dL (ref 11.6–15.9)
LYMPH#: 0.8 10*3/uL — AB (ref 0.9–3.3)
LYMPH%: 12.8 % — ABNORMAL LOW (ref 14.0–49.7)
MCH: 31.4 pg (ref 25.1–34.0)
MCHC: 33 g/dL (ref 31.5–36.0)
MCV: 95.2 fL (ref 79.5–101.0)
MONO#: 0.4 10*3/uL (ref 0.1–0.9)
MONO%: 6.1 % (ref 0.0–14.0)
NEUT%: 78 % — ABNORMAL HIGH (ref 38.4–76.8)
NEUTROS ABS: 4.7 10*3/uL (ref 1.5–6.5)
Platelets: 266 10*3/uL (ref 145–400)
RBC: 4.11 10*6/uL (ref 3.70–5.45)
RDW: 14.1 % (ref 11.2–14.5)
WBC: 6.1 10*3/uL (ref 3.9–10.3)

## 2015-08-15 LAB — COMPREHENSIVE METABOLIC PANEL
ALBUMIN: 3.8 g/dL (ref 3.5–5.0)
ALK PHOS: 129 U/L (ref 40–150)
ALT: 13 U/L (ref 0–55)
AST: 21 U/L (ref 5–34)
Anion Gap: 10 mEq/L (ref 3–11)
BUN: 16.9 mg/dL (ref 7.0–26.0)
CALCIUM: 10 mg/dL (ref 8.4–10.4)
CO2: 29 mEq/L (ref 22–29)
CREATININE: 1 mg/dL (ref 0.6–1.1)
Chloride: 104 mEq/L (ref 98–109)
EGFR: 51 mL/min/{1.73_m2} — ABNORMAL LOW (ref >90)
Glucose: 76 mg/dl (ref 70–140)
Potassium: 4.3 mEq/L (ref 3.5–5.1)
Sodium: 143 mEq/L (ref 136–145)
TOTAL PROTEIN: 7.7 g/dL (ref 6.4–8.3)
Total Bilirubin: 0.54 mg/dL (ref 0.20–1.20)

## 2015-08-15 MED ORDER — IOPAMIDOL (ISOVUE-300) INJECTION 61%
100.0000 mL | Freq: Once | INTRAVENOUS | Status: AC | PRN
  Administered 2015-08-15: 100 mL via INTRAVENOUS

## 2015-08-16 LAB — CEA (PARALLEL TESTING): CEA: 5.8 ng/mL — AB

## 2015-08-16 LAB — CEA: CEA: 9.6 ng/mL — ABNORMAL HIGH (ref 0.0–4.7)

## 2015-08-22 ENCOUNTER — Ambulatory Visit (HOSPITAL_BASED_OUTPATIENT_CLINIC_OR_DEPARTMENT_OTHER): Payer: PPO | Admitting: Hematology

## 2015-08-22 ENCOUNTER — Encounter: Payer: Self-pay | Admitting: Hematology

## 2015-08-22 VITALS — BP 171/69 | HR 61 | Temp 98.1°F | Resp 18 | Ht 63.5 in | Wt 132.0 lb

## 2015-08-22 DIAGNOSIS — K219 Gastro-esophageal reflux disease without esophagitis: Secondary | ICD-10-CM | POA: Diagnosis not present

## 2015-08-22 DIAGNOSIS — C182 Malignant neoplasm of ascending colon: Secondary | ICD-10-CM | POA: Diagnosis not present

## 2015-08-22 DIAGNOSIS — C187 Malignant neoplasm of sigmoid colon: Secondary | ICD-10-CM | POA: Diagnosis not present

## 2015-08-22 DIAGNOSIS — R911 Solitary pulmonary nodule: Secondary | ICD-10-CM

## 2015-08-22 DIAGNOSIS — G620 Drug-induced polyneuropathy: Secondary | ICD-10-CM

## 2015-08-22 NOTE — Progress Notes (Signed)
- [place]  [phone] [phone]  Clinic Follow Up Note   Patient Care Team: Prince Solian, MD as PCP - General (Internal Medicine) Gaye Pollack, MD as Consulting Physician (Cardiothoracic Surgery) Gatha Mayer, MD as Consulting Physician (Gastroenterology) Leighton Ruff, MD as Consulting Physician (General Surgery) Tania Ade, RN as Registered Nurse (Medical Oncology) 08/22/2015  CHIEF COMPLAINTS:  Follow-up of colon cancer  Oncology History   Cancer of left colon [facility])   Staging form: Colon and Rectum, AJCC 7th Edition     Pathologic stage from 05/17/2014: Stage IIA (T3, N0, cM0) - Signed by Truitt Merle, MD on 02/21/2015 Cancer of right colon [facility])   Staging form: Colon and Rectum, AJCC 7th Edition     Pathologic stage from 12/08/2014: Stage IIIB (T4a, N1a, cM0) - Signed by Truitt Merle, MD on 02/21/2015       Cancer of right colon ([place])   04/09/2014 Procedure Colonoscopy per Dr. Silvano Rusk: Large fleshy mass sigmoid colon 25 cm from anus. Could not pass through.   04/09/2014 Tumor Marker CEA =11.7   04/12/2014 Imaging CT C/A/P:sigmoid colon mass;several small areas of soft tissue nodularity in peritoneal cavity; small attenuation structure posterior right hepatic lobe; 2 stable nodules in lungs dating back to 2014. No acute findings.   05/17/2014 Initial Diagnosis Colon cancer-presented with heme + stool and more frequent BMs   05/17/2014 Surgery Robot assisted sigmoidectomy   05/17/2014 Pathologic Stage pT3,pN0,pMX--Grade 1--0/13 nodes +--MMR normal   05/17/2014 Clinical Stage Stage II   12/08/2014 Surgery right hemicolectomy    12/08/2014 Pathology Results right colon adenocarcinoma, pT4aN1c, 19 nodes were negative, LVI(+), MMR normal    01/17/2015 -  Chemotherapy adjuvant capecitabine 1527m q12h, 2 weeks on and 1 week off. Second cycle was postponed to 02/22/2015 due to side effects and dose reduced to [id number]    HISTORY OF PRESENTING  ILLNESS:  Heidi [id number].o. female is here because of recently diagnosed a stage II sigmoid colon cancer.  She had annual physical with her PCP in early Feb this year, and stool OB (+), no overt GI bleeding, CBC was normal. She had noticed some loose BM before that, but no nausea, loss of appetite, weight loss or other symoptoms. Colonoscopy showed a large mass in the sigmoid colon 25 cm from anus. Biopsy was taken which showed tubulovillous adenoma, no malignancy. She was referred to surgeon Dr. ThMarcello Mooresnd underwent robotic-assisted sigmoidectomy on 4/4. She tolerated the procedure well, and was discharged home afterwards.  She has recovered well from the surgery 3 weeks ago, she has mild fatigue, but otherwise doing very well without other complains. She had 2-3 episodes of bleeding after the surgery, which was related to her herromods. She is eating well. 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   She has chronic knee pain she takes ibuprofen once daily.    CURRENT THERAPY: Surveillance  INTERIM HISTORY: Mrs. WeSkodaeturns for follow-up. She has recovered well from chemotherapy, has better appetite and energy level. She functions well at home. She had one episode o frectal bleeding in Mid June, resolved next day, no pain, abdominal bloating, nausea, or other symptoms. Her BM is normal, no melena.   MEDICAL HISTORY:  Past Medical History  Diagnosis Date  . Diverticulosis of colon     severe in sigmoid but diffusley throughout colon  on 2009 colonoscopy  . Internal hemorrhoids   .  Hematochezia 2009  . Benign esophageal stricture 04/2007    EGD with dilatation.  Marland Kitchen GERD (gastroesophageal reflux disease)   . HH (hiatus hernia)   . Macular degeneration   . IBS (irritable bowel syndrome)   . Epistaxis ~ 2005    required cauterization twice.   . Reflux esophagitis   . Allergy   . Cataract     ight eye starting  . Arthritis      knees  . History of skin cancer   . Colon cancer ([place])      COLON CANCER / HX SKIN CANCER    SURGICAL HISTORY: Past Surgical History  Procedure Laterality Date  . Abdominal hysterectomy      partial with the bladder tack  . Bladder tack    . Retinal laser surgery    . Basal cell resection  08/2011, 11/2011    initially biopsy 08/3011.  repeat excision right nose with grafting from the skin behind the right ear  . Colonoscopy    . Esophagogastroduodenoscopy    . Upper gastrointestinal endoscopy    . Inguinal hernia repair  early 1990s    right  . Colonoscopy with propofol N/A 09/30/2014    Procedure: COLONOSCOPY WITH PROPOFOL;  Surgeon: Milus Banister, MD;  Location: [facility];  Service: Endoscopy;  Laterality: N/A;  . Colon surgery  APRIL 2016    SIGMOIDECTOMY    SOCIAL HISTORY: Social History   Social History  . Marital Status: Married    Spouse Name: N/A  . Number of Children: N/A  . Years of Education: N/A   Occupational History  . Not on file.   Social History Main Topics  . Smoking status: Passive Smoke Exposure - Never Smoker  . Smokeless tobacco: Never Used  . Alcohol Use: No  . Drug Use: No  . Sexual Activity: Not on file   Other Topics Concern  . Not on file   Social History Narrative   Married, husband is disabled (physical and dementia) and she is caregiver   Has #1 child (daughter) living. Son died of MI at age 20   Worked at [employer] before she retired   Has #3 cats    FAMILY HISTORY: Family History  Problem Relation Age of Onset  . Colon cancer Paternal Grandfather 35    colon cancer   . Esophageal cancer Neg Hx   . Rectal cancer Neg Hx   . Stomach cancer Neg Hx   . Stroke Father   . Heart attack Son 33    died of heart attack     ALLERGIES:  is allergic to penicillins; sulfonamide derivatives; and amoxicillin-pot clavulanate.  MEDICATIONS:  Current Outpatient Prescriptions  Medication Sig Dispense Refill  . alendronate  (FOSAMAX) 70 MG tablet Take 70 mg by mouth every 7 (seven) days. Tuesdays. Take with a full glass of water on an empty stomach.    . Ascorbic Acid (VITAMIN C) 1000 MG tablet Take 1,000 mg by mouth daily.    . Biotin 1000 MCG tablet Take 1,000 mcg by mouth daily.     Marland Kitchen CALCIUM & MAGNESIUM CARBONATES PO Take 1 tablet by mouth daily.    . capecitabine (XELODA) 500 MG tablet Take 2 tablets (1,000 mg total) by mouth 2 (two) times daily after a meal. Take for 14 days then off for 7 days 26 tablet 0  . cholecalciferol (VITAMIN D) 1000 UNITS tablet Take 1,000 Units by mouth daily.    . diphenoxylate-atropine (LOMOTIL)  2.5-0.025 MG tablet Take 1-2 tablets by mouth 4 (four) times daily as needed for diarrhea or loose stools. 30 tablet 2  . glucosamine-chondroitin 500-400 MG tablet Take 1 tablet by mouth daily.     . Homeopathic Products ([employer] LEG CRAMPS PAIN RELIEF PO) Take 1 tablet by mouth daily as needed (for leg cramps).    Marland Kitchen HYDROcodone-acetaminophen (NORCO/VICODIN) 5-325 MG tablet Take 1-2 tablets by mouth every 6 (six) hours as needed. 30 tablet 0  . hydrocortisone 1 % lotion Apply 1 application topically 2 (two) times daily. Apply to hands 118 mL 0  . ibuprofen (ADVIL,MOTRIN) 200 MG tablet Take 200 mg by mouth daily as needed for moderate pain.     Marland Kitchen loratadine (CLARITIN) 10 MG tablet Take 10 mg by mouth daily.    . Multiple Vitamin (MULTIVITAMIN) tablet Take 1 tablet by mouth daily.    . urea (CARMOL) 10 % cream Apply topically 2 (two) times daily. Apply to hand twice daily 142 g 0  . vitamin B-12 (CYANOCOBALAMIN) 1000 MCG tablet Take 1,000 mcg by mouth daily.     . potassium chloride SA (K-DUR,KLOR-CON) 20 MEQ tablet Take 1 tablet (20 mEq total) by mouth daily. (Patient not taking: Reported on 08/22/2015) 20 tablet 1   No current facility-administered medications for this visit.    REVIEW OF SYSTEMS:   Constitutional: Denies fevers, chills or abnormal night sweats Eyes: Denies blurriness of  vision, double vision or watery eyes Ears, nose, mouth, throat, and face: Denies mucositis or sore throat Respiratory: Denies cough, dyspnea or wheezes Cardiovascular: Denies palpitation, chest discomfort or lower extremity swelling Gastrointestinal:  Denies nausea, heartburn or change in bowel habits Skin: Denies abnormal skin rashes Lymphatics: Denies new lymphadenopathy or easy bruising Neurological:Denies numbness, tingling or new weaknesses Behavioral/Psych: Mood is stable, no new changes  All other systems were reviewed with the patient and are negative.  PHYSICAL EXAMINATION: ECOG PERFORMANCE STATUS: 1  Filed Vitals:   08/22/15 1312  BP: 171/69  Pulse: 61  Temp: 98.1 F (36.7 C)  Resp: 18   Filed Weights   08/22/15 1312  Weight: 132 lb (59.875 kg)    GENERAL:alert, no distress and comfortable SKIN: skin color, texture, turgor are normal, no rashes or significant lesions except skin redness on palms and bottom of her feet, with skin peeling, a few skin rashes on her hands and arms  EYES: normal, conjunctiva are pink and non-injected, sclera clear OROPHARYNX:no exudate, no erythema and lips, buccal mucosa, and tongue normal  NECK: supple, thyroid normal size, non-tender, without nodularity LYMPH:  no palpable lymphadenopathy in the cervical, axillary or inguinal LUNGS: clear to auscultation and percussion with normal breathing effort HEART: regular rate & rhythm and no murmurs and no lower extremity edema ABDOMEN:abdomen soft, surgical incision and to the low abdomen is well healed. non-tender and normal bowel sounds Musculoskeletal:no cyanosis of digits and no clubbing  PSYCH: alert & oriented x 3 with fluent speech NEURO: no focal motor/sensory deficits  LABORATORY DATA:  I have reviewed the data as listed CBC Latest Ref Rng 08/15/2015 06/27/2015 05/23/2015  WBC 3.9 - 10.3 10e3/uL 6.1 5.8 7.1  Hemoglobin 11.6 - 15.9 g/dL 12.9 12.5 12.2  Hematocrit 34.8 - 46.6 % 39.2  36.9 36.3  Platelets 145 - 400 10e3/uL 266 156 144(L)   CMP Latest Ref Rng 08/15/2015 06/27/2015 05/23/2015  Glucose 70 - 140 mg/dl 76 104 92  BUN 7.0 - 26.0 mg/dL 16.9 17.9 17.7  Creatinine 0.6 - 1.1  mg/dL 1.0 0.9 0.9  Sodium 136 - 145 mEq/L 143 145 141  Potassium 3.5 - 5.1 mEq/L 4.3 4.4 4.3  CO2 22 - 29 mEq/L 29 32(H) 29  Calcium 8.4 - 10.4 mg/dL 10.0 9.5 9.3  Total Protein 6.4 - 8.3 g/dL 7.7 6.5 6.5  Total Bilirubin 0.20 - 1.20 mg/dL 0.54 1.01 1.31(H)  Alkaline Phos 40 - 150 U/L 129 90 78  AST 5 - 34 U/L '21 24 25  ' ALT 0 - 55 U/L '13 17 16   ' CEA  Status: Finalresult Visible to patient:  Not Released Nextappt: Today at 01:00 PM in Oncology [facility] Medico, Krista Blue, MD) Dx:  Malignant neoplasm of ascending colon...              Ref Range 7d ago  62moago     CEA 0.0 - 4.7 ng/mL 9.6 (H)           Pathology report: Diagnosis 05/17/2014 1. Colon, segmental resection for tumor, sigmoid, open end proximal - INVASIVE ADENOCARCINOMA WITH EXTRACELLULAR MUCIN, INVADING THROUGH THE MUSCULARIS PROPRIA INTO PERICOLONIC FATTY TISSUE. - THIRTEEN LYMPH NODES, NEGATIVE FOR METASTATIC CARCINOMA (0/13). - MULTIPLE DIVERTICULA. - RESECTION MARGINS, NEGATIVE FOR ATYPIA OR MALIGNANCY. - PLEASE SEE ONCOLOGY TEMPLATE FOR DETAILS. 2. Colon, resection margin (donut), sigmoid - BENIGN COLONIC MUCOSA, NO EVIDENCE OF DYSPLASIA OR MALIGNANCY. Specimen: Left colon. Procedure: Segmental resection. Tumor site: Sigmoid colon. Specimen integrity: Intact. Macroscopic intactness of mesorectum: N/A Macroscopic tumor perforation: No Invasive tumor: Invasive adenocarcinoma with extracellular mucin. Maximum size: 8.0 cm, gross measurement. Histologic type(s): Invasive adenocarcinoma with extracellular mucin. Histologic grade and differentiation: G1: well differentiated/low grade Type of polyp in which invasive carcinoma arose: Villous adenoma. Microscopic extension of invasive tumor: Invading through  the muscularis propria into pericolonic fatty tissue. Lymph-Vascular invasion: Not identified. Peri-neural invasion: Not identified. Tumor deposit(s) (discontinuous extramural extension): None. Resection margins: Negative. Proximal margin: 9.6 cm. Distal margin: 9.6 cm. Mesenteric margin (sigmoid and transverse): 6 cm. Treatment effect (neo-adjuvant therapy): No Additional polyp(s): N/A Non-neoplastic findings: Prominent Crohn-like reaction around the tumor. Lymph nodes: number examined 13; number positive: 0 Pathologic Staging: pT3, pN0, pMX  Diagnosis 12/08/2014  Colon, segmental resection for tumor, right colon MODERATELY DIFFERENTIATED ADENOCARCINOMA OF THE COLON (3.8 CM) THE TUMOR PENETRATES TO THE SURFACE OF THE VISCERAL PERITONEUM (T4A) LYMPHOVASCULAR INVASION IS IDENTIFIED MARGINS OF RESECTION ARE NEGATIVE FOR TUMOR NINTEEN BENIGN LYMPH NODE (0/19) ONE TUMOR DEPOSITE IS IDENTIFIED ([place] Specimen: Colon Procedure: Segmental resection Tumor site: Ascending colon Specimen integrity: Intact Macroscopic intactness of mesorectum: Not applicable: Macroscopic tumor perforation: The tumor invades through the muscularis propria penatrate the surface of serosa Invasive tumor: Maximum size: 3.8 Histologic type(s): Adenocarcinoma G2 G2: moderately differentiated/low grade Type of polyp in which invasive carcinoma arose: Tubular adenoma Microscopic extension of invasive tumor: Serosa Lymph-Vascular invasion: Present Peri-neural invasion: Negative Tumor deposit(s) (discontinuous extramural extension): Present Resection margins: Proximal margin: 12 cm Distal margin: 23 cm Circumferential (radial) (posterior ascending, posterior descending; lateral and posterior mid-rectum; and entire lower 1/3 rectum):3.5 cm Mesenteric margin (sigmoid and transverse): NA Distance closest margin (if all above margins negative): _ Treatment effect (neo-adjuvant therapy): Negative Additional  polyp(s): Negative Non-neoplastic findings: Unremarkabel Lymph nodes: number examined 19; number positive: 0 Pathologic Staging: T4a, N1c, M_  MMR: NORMAL MSI: STABLE  Mismatch Repair (MMR) Protein Immunohistochemistry (IHC) IHC Expression Result: MLH1: Preserved nuclear expression (greater 50% tumor expression) MSH2: Preserved nuclear expression (greater 50% tumor expression) MSH6: Preserved nuclear expression (greater 50% tumor expression) PMS2: Preserved nuclear  expression (greater 50% tumor expression) * Internal control demonstrates intact nuclear expression Interpretation: NORMAL   RADIOGRAPHIC STUDIES: I have personally reviewed the radiological images as listed and agreed with the findings in the report.  CT chest, abdomen and pelvis with contrast 09/16/2014 IMPRESSION: Annular constricting masslike soft tissue density involving the cecum measuring 2.3 x 3.3 cm, highly suspicious for metachronous primary colon carcinoma. Colonoscopy is recommended for further evaluation.  Increased size of 1.5 cm right pericolonic soft tissue nodule or lymph node, consistent with metastatic disease. Small less than 1 cm right lower quadrant mesenteric lymph nodes remain stable; local metastatic disease cannot be excluded.  No liver metastases identified.  Two new ill-defined pulmonary nodules in right upper lobe and left lower lobe, suspicious for pulmonary metastases.  Stable incidental findings including 4.0 cm ascending thoracic aortic aneurysm, left thyroid lobe nodule, and cholelithiasis  CT chest 12/29/2014  IMPRESSION: 1. Interval stability of previously described right upper lobe 1.1 cm pulmonary nodule, which has been stable since 09/19/2012, suggesting a benign etiology. 2. Interval decreased size of previously described left lower lobe pulmonary nodule, suggesting a resolving inflammatory lesion. 3. No new significant pulmonary nodules. No evidence of  metastatic disease in the chest. 4. Stable multinodular goiter, tiny hiatal hernia and cholelithiasis  CT abdomen and pelvis w contrast 08/15/2015 IMPRESSION: 1. Residual / recurrent peritoneal metastasis with ventral peritoneal nodule position between the stomach and transverse colon. Second nodule along the inferior margin the RIGHT hepatic lobe. 2. Partial RIGHT hemicolectomy anatomy. 3. No evidence of liver metastasis or nodal metastasis for 4. Irregular nodular densities in the RIGHT lower lobe and RIGHT middle lobe are favored post infectious or inflammatory.   ASSESSMENT & PLAN:  82 year old Caucasian female, with past medical history of colon diverticulosis and arthritis, but otherwise healthy and active, who was found to have a large sigmoid tumor, status post sigmoidectomy.  1. Sigmoid colon adenocarcinoma, pT3N0M0, stage II, G1, MSI stable, ascending colon adenocarcinoma, pT4aN1cM0, stage IIIB, MSI stable, G2 -She had multifocal (2) colon cancer status post complete surgical resection,  -I reviewed her recent surgical pathology results in great details with her. The right colon cancer has several high-risk features, including stage IIIB, T4a lesion, tumor deposits, lymphovascular invasion, her risk of colon cancer recurrence is high.  -She tried adjuvant chemotherapy Xeloda, but could not tolerate full dose, and last cycle chemotherapy was canceled due to her tolerance issue. -I reviewed her restaging CT scan, which showed a 2 new peritoneal nodule, suspicious for peritoneal carcinomatosis. She is not symptomatic now. -Her to follow-up her CEA has also increased lately, concerning for cancer recurrence. -I discussed the option of biopsy the bigger return your nodule, versus observation giving the lack of clinical symptoms. -If she has confirmed metastatic disease, then her disease is not curable, and her goal of care is palliation. Due to her advanced age, and poor tolerance to  chemotherapy, I will likely offer supportive care alone if she had metastatic cancer. She is not a candidate for debulking and HPEC.  -Her tumor has MSI stable, not a good candidate for immunotherapy. -I recommend her to have a repeat his PET scan in 2 months for follow-up. She agrees  2. Peripheral neuropathy, G 1 - noticed from second cycle of Xeloda, improved some since he came off chemotherapy.  3. Hand-foot syndrome from Xeloda -resolved   4. GERD -she'll continue Protonix.  5.Lung nodules  -She has two small right lung nodules which were discovered on the  CT scan in 09/2012,  RUL nodule slightly bigger on recent CT -She has been follow-up with Dr. Cyndia Bent.  -she never smoked, low risk for lung cancer -given the stability of the RUL nodule over 2 years, this is likely a benign lesion   6. Rectal bleeding -She had one small episode of rectal bleeding, no anemia on lab test recently. -She is due for colonoscopy, due to the concerning of peritoneal metastasis, I'll only repeat colonoscopy if she has significant recurrence of rectal bleeding.  Plan -Lab results and CT scan findings reviewed with her, concerning for peritoneal metastasis -RTC in 2 month with a restaging PET   All questions were answered. The patient knows to call the clinic with any problems, questions or concerns.  I spent 20 minutes counseling the patient face to face. The total time spent in the appointment was 25  minutes and more than 50% was on counseling.     Truitt Merle, MD 08/22/2015

## 2015-08-24 ENCOUNTER — Telehealth: Payer: Self-pay | Admitting: Hematology

## 2015-08-24 NOTE — Telephone Encounter (Signed)
Spoke with patient re lab/fu for August and September. Central radiology scheduling will call re pet scan  Patient aware.

## 2015-10-10 ENCOUNTER — Other Ambulatory Visit: Payer: Self-pay

## 2015-10-10 ENCOUNTER — Ambulatory Visit (HOSPITAL_COMMUNITY)
Admission: RE | Admit: 2015-10-10 | Discharge: 2015-10-10 | Disposition: A | Discharge status: Home / Self Care | Payer: PPO | Source: Ambulatory Visit | Attending: Hematology | Admitting: Hematology

## 2015-10-10 DIAGNOSIS — E042 Nontoxic multinodular goiter: Secondary | ICD-10-CM | POA: Insufficient documentation

## 2015-10-10 DIAGNOSIS — R938 Abnormal findings on diagnostic imaging of other specified body structures: Secondary | ICD-10-CM | POA: Diagnosis not present

## 2015-10-10 DIAGNOSIS — C182 Malignant neoplasm of ascending colon: Secondary | ICD-10-CM

## 2015-10-10 DIAGNOSIS — C189 Malignant neoplasm of colon, unspecified: Secondary | ICD-10-CM | POA: Diagnosis not present

## 2015-10-10 DIAGNOSIS — R935 Abnormal findings on diagnostic imaging of other abdominal regions, including retroperitoneum: Secondary | ICD-10-CM | POA: Insufficient documentation

## 2015-10-10 LAB — GLUCOSE, CAPILLARY: GLUCOSE-CAPILLARY: 93 mg/dL (ref 65–99)

## 2015-10-10 MED ORDER — FLUDEOXYGLUCOSE F - 18 (FDG) INJECTION
6.5800 | Freq: Once | INTRAVENOUS | Status: AC | PRN
  Administered 2015-10-10: 6.58 via INTRAVENOUS

## 2015-10-20 ENCOUNTER — Encounter: Payer: Self-pay | Admitting: Hematology

## 2015-10-20 ENCOUNTER — Ambulatory Visit (HOSPITAL_BASED_OUTPATIENT_CLINIC_OR_DEPARTMENT_OTHER): Payer: PPO | Admitting: Hematology

## 2015-10-20 ENCOUNTER — Telehealth: Payer: Self-pay | Admitting: Hematology

## 2015-10-20 VITALS — BP 160/71 | HR 57 | Temp 98.0°F | Resp 17 | Ht 63.5 in | Wt 136.3 lb

## 2015-10-20 DIAGNOSIS — C182 Malignant neoplasm of ascending colon: Secondary | ICD-10-CM

## 2015-10-20 DIAGNOSIS — K219 Gastro-esophageal reflux disease without esophagitis: Secondary | ICD-10-CM

## 2015-10-20 DIAGNOSIS — R911 Solitary pulmonary nodule: Secondary | ICD-10-CM

## 2015-10-20 DIAGNOSIS — G620 Drug-induced polyneuropathy: Secondary | ICD-10-CM | POA: Diagnosis not present

## 2015-10-20 DIAGNOSIS — C187 Malignant neoplasm of sigmoid colon: Secondary | ICD-10-CM | POA: Diagnosis not present

## 2015-10-20 NOTE — Progress Notes (Signed)
- [place]  Telephone:([phone]) [phone] Fax:([phone]) [phone]  Clinic Follow Up Note   Patient Care Team: Prince Solian, MD as PCP - General (Internal Medicine) Gaye Pollack, MD as Consulting Physician (Cardiothoracic Surgery) Gatha Mayer, MD as Consulting Physician (Gastroenterology) Leighton Ruff, MD as Consulting Physician (General Surgery) Tania Ade, RN as Registered Nurse (Medical Oncology) 10/20/2015  CHIEF COMPLAINTS:  Follow-up of colon cancer  Oncology History   Cancer of left colon [facility])   Staging form: Colon and Rectum, AJCC 7th Edition     Pathologic stage from 05/17/2014: Stage IIA (T3, N0, cM0) - Signed by Truitt Merle, MD on 02/21/2015 Cancer of right colon [facility])   Staging form: Colon and Rectum, AJCC 7th Edition     Pathologic stage from 12/08/2014: Stage IIIB (T4a, N1a, cM0) - Signed by Truitt Merle, MD on 02/21/2015       Cancer of right colon ([place])   04/09/2014 Procedure    Colonoscopy per Dr. Silvano Rusk: Large fleshy mass sigmoid colon 25 cm from anus. Could not pass through.      04/09/2014 Tumor Marker    CEA =11.7      04/12/2014 Imaging    CT C/A/P:sigmoid colon mass;several small areas of soft tissue nodularity in peritoneal cavity; small attenuation structure posterior right hepatic lobe; 2 stable nodules in lungs dating back to 2014. No acute findings.      05/17/2014 Initial Diagnosis    Colon cancer-presented with heme + stool and more frequent BMs      05/17/2014 Surgery    Robot assisted sigmoidectomy      05/17/2014 Pathologic Stage    pT3,pN0,pMX--Grade 1--0/13 nodes +--MMR normal      05/17/2014 Clinical Stage    Stage II      12/08/2014 Surgery    right hemicolectomy       12/08/2014 Pathology Results    right colon adenocarcinoma, pT4aN1c, 19 nodes were negative, LVI(+), MMR normal       01/17/2015 - 06/27/2015 Chemotherapy    adjuvant capecitabine 1585m q12h, 2 weeks on and 1 week off. dose reduction and stopped  after 6 cycles due to poor tolerance,       08/15/2015 Progression    CT abdomen showed peritoneal nodules, highly suspicious for recurrent metastasis      10/10/2015 Imaging    PET scan showed multiple enlarging, hypermetabolic peritoneal nodules consistent with peritoneal carcinomatosis. No other evidence of metastasis       HISTORY OF PRESENTING ILLNESS:  Heidi ALCALDE82y.o. female is here because of recently diagnosed a stage II sigmoid colon cancer.  She had annual physical with her PCP in early Feb this year, and stool OB (+), no overt GI bleeding, CBC was normal. She had noticed some loose BM before that, but no nausea, loss of appetite, weight loss or other symoptoms. Colonoscopy showed a large mass in the sigmoid colon 25 cm from anus. Biopsy was taken which showed tubulovillous adenoma, no malignancy. She was referred to surgeon Dr. TMarcello Mooresand underwent robotic-assisted sigmoidectomy on 4/4. She tolerated the procedure well, and was discharged home afterwards.  She has recovered well from the surgery 3 weeks ago, she has mild fatigue, but otherwise doing very well without other complains. She had 2-3 episodes of bleeding after the surgery, which was related to her herromods. She is eating well. 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   She has  chronic knee pain she takes ibuprofen once daily.    CURRENT THERAPY: Surveillance  INTERIM HISTORY: [name] returns for follow-up. She Is accompanied by her daughter to the clinic today. She has had a few episodes of low abdominal pain, she took Tylenol and it resolved. She denies constipation, she has normal bowel movement 2-3 times a day. No nausea, abdominal bloating, or other new symptoms. Her weight is stable, her appetite and energy level are decent, she functions well at home.  MEDICAL HISTORY:  Past Medical History:  Diagnosis Date  . Allergy   . Arthritis    knees  .  Benign esophageal stricture 04/2007   EGD with dilatation.  . Cataract    ight eye starting  . Colon cancer ([place])     COLON CANCER / HX SKIN CANCER  . Diverticulosis of colon    severe in sigmoid but diffusley throughout colon  on 2009 colonoscopy  . Epistaxis ~ 2005   required cauterization twice.   Marland Kitchen GERD (gastroesophageal reflux disease)   . Hematochezia 2009  . HH (hiatus hernia)   . History of skin cancer   . IBS (irritable bowel syndrome)   . Internal hemorrhoids   . Macular degeneration   . Reflux esophagitis     SURGICAL HISTORY: Past Surgical History:  Procedure Laterality Date  . ABDOMINAL HYSTERECTOMY     partial with the bladder tack  . basal cell resection  08/2011, 11/2011   initially biopsy 08/3011.  repeat excision right nose with grafting from the skin behind the right ear  . bladder tack    . COLON SURGERY  APRIL 2016   SIGMOIDECTOMY  . COLONOSCOPY    . COLONOSCOPY WITH PROPOFOL N/A 09/30/2014   Procedure: COLONOSCOPY WITH PROPOFOL;  Surgeon: Milus Banister, MD;  Location: [facility];  Service: Endoscopy;  Laterality: N/A;  . ESOPHAGOGASTRODUODENOSCOPY    . INGUINAL HERNIA REPAIR  early 1990s   right  . retinal laser surgery    . UPPER GASTROINTESTINAL ENDOSCOPY      SOCIAL HISTORY: Social History   Social History  . Marital status: Married    Spouse name: N/A  . Number of children: N/A  . Years of education: N/A   Occupational History  . Not on file.   Social History Main Topics  . Smoking status: Passive Smoke Exposure - Never Smoker  . Smokeless tobacco: Never Used  . Alcohol use No  . Drug use: No  . Sexual activity: Not on file   Other Topics Concern  . Not on file   Social History Narrative   Married, husband is disabled (physical and dementia) and she is caregiver   Has #1 child (daughter) living. Son died of MI at age 61   Worked at [employer] before she retired   Has #3 cats    FAMILY HISTORY: Family History   Problem Relation Age of Onset  . Colon cancer Paternal Grandfather 24    colon cancer   . Esophageal cancer Neg Hx   . Rectal cancer Neg Hx   . Stomach cancer Neg Hx   . Stroke Father   . Heart attack Son 37    died of heart attack     ALLERGIES:  is allergic to penicillins; sulfonamide derivatives; and amoxicillin-pot clavulanate.  MEDICATIONS:  Current Outpatient Prescriptions  Medication Sig Dispense Refill  . alendronate (FOSAMAX) 70 MG tablet Take 70 mg by mouth every 7 (seven) days. Tuesdays. Take with a full  glass of water on an empty stomach.    . Ascorbic Acid (VITAMIN C) 1000 MG tablet Take 1,000 mg by mouth daily.    . Biotin 1000 MCG tablet Take 1,000 mcg by mouth daily.     Marland Kitchen CALCIUM & MAGNESIUM CARBONATES PO Take 1 tablet by mouth daily.    . cholecalciferol (VITAMIN D) 1000 UNITS tablet Take 1,000 Units by mouth daily.    Marland Kitchen glucosamine-chondroitin 500-400 MG tablet Take 1 tablet by mouth daily.     . Homeopathic Products ([employer] LEG CRAMPS PAIN RELIEF PO) Take 1 tablet by mouth daily as needed (for leg cramps).    Marland Kitchen ibuprofen (ADVIL,MOTRIN) 200 MG tablet Take 200 mg by mouth daily as needed for moderate pain.     Marland Kitchen loratadine (CLARITIN) 10 MG tablet Take 10 mg by mouth daily.    . Multiple Vitamin (MULTIVITAMIN) tablet Take 1 tablet by mouth daily.    . urea (CARMOL) 10 % cream Apply topically 2 (two) times daily. Apply to hand twice daily 142 g 0  . vitamin B-12 (CYANOCOBALAMIN) 1000 MCG tablet Take 1,000 mcg by mouth daily.     . diphenoxylate-atropine (LOMOTIL) 2.5-0.025 MG tablet Take 1-2 tablets by mouth 4 (four) times daily as needed for diarrhea or loose stools. (Patient not taking: Reported on 10/20/2015) 30 tablet 2  . HYDROcodone-acetaminophen (NORCO/VICODIN) 5-325 MG tablet Take 1-2 tablets by mouth every 6 (six) hours as needed. (Patient not taking: Reported on 10/20/2015) 30 tablet 0  . hydrocortisone 1 % lotion Apply 1 application topically 2 (two) times daily.  Apply to hands (Patient not taking: Reported on 10/20/2015) 118 mL 0  . potassium chloride SA (K-DUR,KLOR-CON) 20 MEQ tablet Take 1 tablet (20 mEq total) by mouth daily. (Patient not taking: Reported on 08/22/2015) 20 tablet 1   No current facility-administered medications for this visit.     REVIEW OF SYSTEMS:   Constitutional: Denies fevers, chills or abnormal night sweats Eyes: Denies blurriness of vision, double vision or watery eyes Ears, nose, mouth, throat, and face: Denies mucositis or sore throat Respiratory: Denies cough, dyspnea or wheezes Cardiovascular: Denies palpitation, chest discomfort or lower extremity swelling Gastrointestinal:  Denies nausea, heartburn or change in bowel habits Skin: Denies abnormal skin rashes Lymphatics: Denies new lymphadenopathy or easy bruising Neurological:Denies numbness, tingling or new weaknesses Behavioral/Psych: Mood is stable, no new changes  All other systems were reviewed with the patient and are negative.  PHYSICAL EXAMINATION: ECOG PERFORMANCE STATUS: 1  Vitals:   10/20/15 1446  BP: (!) 160/71  Pulse: (!) 57  Resp: 17  Temp: 98 F (36.7 C)   Filed Weights   10/20/15 1446  Weight: 136 lb 4.8 oz (61.8 kg)    GENERAL:alert, no distress and comfortable SKIN: skin color, texture, turgor are normal, no rashes or significant lesions except skin redness on palms and bottom of her feet, with skin peeling, a few skin rashes on her hands and arms  EYES: normal, conjunctiva are pink and non-injected, sclera clear OROPHARYNX:no exudate, no erythema and lips, buccal mucosa, and tongue normal  NECK: supple, thyroid normal size, non-tender, without nodularity LYMPH:  no palpable lymphadenopathy in the cervical, axillary or inguinal LUNGS: clear to auscultation and percussion with normal breathing effort HEART: regular rate & rhythm and no murmurs and no lower extremity edema ABDOMEN:abdomen soft, surgical incision and to the low abdomen is  well healed. non-tender and normal bowel sounds Musculoskeletal:no cyanosis of digits and no clubbing  PSYCH: alert &  oriented x 3 with fluent speech NEURO: no focal motor/sensory deficits  LABORATORY DATA:  I have reviewed the data as listed CBC Latest Ref Rng & Units 08/15/2015 06/27/2015 05/23/2015  WBC 3.9 - 10.3 10e3/uL 6.1 5.8 7.1  Hemoglobin 11.6 - 15.9 g/dL 12.9 12.5 12.2  Hematocrit 34.8 - 46.6 % 39.2 36.9 36.3  Platelets 145 - 400 10e3/uL 266 156 144(L)   CMP Latest Ref Rng & Units 08/15/2015 06/27/2015 05/23/2015  Glucose 70 - 140 mg/dl 76 104 92  BUN 7.0 - 26.0 mg/dL 16.9 17.9 17.7  Creatinine 0.6 - 1.1 mg/dL 1.0 0.9 0.9  Sodium 136 - 145 mEq/L 143 145 141  Potassium 3.5 - 5.1 mEq/L 4.3 4.4 4.3  Chloride 101 - 111 mmol/L - - -  CO2 22 - 29 mEq/L 29 32(H) 29  Calcium 8.4 - 10.4 mg/dL 10.0 9.5 9.3  Total Protein 6.4 - 8.3 g/dL 7.7 6.5 6.5  Total Bilirubin 0.20 - 1.20 mg/dL 0.54 1.01 1.31(H)  Alkaline Phos 40 - 150 U/L 129 90 78  AST 5 - 34 U/L '21 24 25  ' ALT 0 - 55 U/L '13 17 16   ' Results for NELLENE, COURTOIS (MRN [id number]) as of 10/20/2015 16:14  Ref. Range 02/03/2015 08:32 05/02/2015 12:41 08/15/2015 13:37  CEA Latest Units: ng/mL 1.4 1.4 5.8 (H)  CEA Latest Ref Range: 0.0 - 4.7 ng/mL  2.6 9.6 (H)        Pathology report: Diagnosis 05/17/2014 1. Colon, segmental resection for tumor, sigmoid, open end proximal - INVASIVE ADENOCARCINOMA WITH EXTRACELLULAR MUCIN, INVADING THROUGH THE MUSCULARIS PROPRIA INTO PERICOLONIC FATTY TISSUE. - THIRTEEN LYMPH NODES, NEGATIVE FOR METASTATIC CARCINOMA (0/13). - MULTIPLE DIVERTICULA. - RESECTION MARGINS, NEGATIVE FOR ATYPIA OR MALIGNANCY. - PLEASE SEE ONCOLOGY TEMPLATE FOR DETAILS. 2. Colon, resection margin (donut), sigmoid - BENIGN COLONIC MUCOSA, NO EVIDENCE OF DYSPLASIA OR MALIGNANCY. Specimen: Left colon. Procedure: Segmental resection. Tumor site: Sigmoid colon. Specimen integrity: Intact. Macroscopic intactness of mesorectum:  N/A Macroscopic tumor perforation: No Invasive tumor: Invasive adenocarcinoma with extracellular mucin. Maximum size: 8.0 cm, gross measurement. Histologic type(s): Invasive adenocarcinoma with extracellular mucin. Histologic grade and differentiation: G1: well differentiated/low grade Type of polyp in which invasive carcinoma arose: Villous adenoma. Microscopic extension of invasive tumor: Invading through the muscularis propria into pericolonic fatty tissue. Lymph-Vascular invasion: Not identified. Peri-neural invasion: Not identified. Tumor deposit(s) (discontinuous extramural extension): None. Resection margins: Negative. Proximal margin: 9.6 cm. Distal margin: 9.6 cm. Mesenteric margin (sigmoid and transverse): 6 cm. Treatment effect (neo-adjuvant therapy): No Additional polyp(s): N/A Non-neoplastic findings: Prominent Crohn-like reaction around the tumor. Lymph nodes: number examined 13; number positive: 0 Pathologic Staging: pT3, pN0, pMX  Diagnosis 12/08/2014  Colon, segmental resection for tumor, right colon MODERATELY DIFFERENTIATED ADENOCARCINOMA OF THE COLON (3.8 CM) THE TUMOR PENETRATES TO THE SURFACE OF THE VISCERAL PERITONEUM (T4A) LYMPHOVASCULAR INVASION IS IDENTIFIED MARGINS OF RESECTION ARE NEGATIVE FOR TUMOR NINTEEN BENIGN LYMPH NODE (0/19) ONE TUMOR DEPOSITE IS IDENTIFIED ([place]) Specimen: Colon Procedure: Segmental resection Tumor site: Ascending colon Specimen integrity: Intact Macroscopic intactness of mesorectum: Not applicable: Macroscopic tumor perforation: The tumor invades through the muscularis propria penatrate the surface of serosa Invasive tumor: Maximum size: 3.8 Histologic type(s): Adenocarcinoma G2 G2: moderately differentiated/low grade Type of polyp in which invasive carcinoma arose: Tubular adenoma Microscopic extension of invasive tumor: Serosa Lymph-Vascular invasion: Present Peri-neural invasion: Negative Tumor deposit(s)  (discontinuous extramural extension): Present Resection margins: Proximal margin: 12 cm Distal margin: 23 cm Circumferential (radial) (posterior ascending, posterior descending;  lateral and posterior mid-rectum; and entire lower 1/3 rectum):3.5 cm Mesenteric margin (sigmoid and transverse): NA Distance closest margin (if all above margins negative): _ Treatment effect (neo-adjuvant therapy): Negative Additional polyp(s): Negative Non-neoplastic findings: Unremarkabel Lymph nodes: number examined 19; number positive: 0 Pathologic Staging: T4a, N1c, M_  MMR: NORMAL MSI: STABLE  Mismatch Repair (MMR) Protein Immunohistochemistry (IHC) IHC Expression Result: MLH1: Preserved nuclear expression (greater 50% tumor expression) MSH2: Preserved nuclear expression (greater 50% tumor expression) MSH6: Preserved nuclear expression (greater 50% tumor expression) PMS2: Preserved nuclear expression (greater 50% tumor expression) * Internal control demonstrates intact nuclear expression Interpretation: NORMAL   RADIOGRAPHIC STUDIES: I have personally reviewed the radiological images as listed and agreed with the findings in the report.  PET 10/10/2015 IMPRESSION: 1. Multiple enlarging, hypermetabolic peritoneal nodules consistent with peritoneal carcinomatosis. No generalized ascites. 2. No evidence of metastatic disease to the liver. 3. No suspicious pulmonary findings. Grossly stable chronic postinflammatory changes. 4. Multinodular goiter.    ASSESSMENT & PLAN:  82 year old Caucasian female, with past medical history of colon diverticulosis and arthritis, but otherwise healthy and active, who was found to have a large sigmoid tumor, status post sigmoidectomy.  1. Sigmoid colon adenocarcinoma, pT3N0M0, stage II, G1, MSI stable, ascending colon adenocarcinoma, pT4aN1cM0, stage IIIB, MSI stable, G2 -She had multifocal (2) colon cancer status post complete surgical resection,  -I  reviewed her recent surgical pathology results in great details with her. The right colon cancer has several high-risk features, including stage IIIB, T4a lesion, tumor deposits, lymphovascular invasion, her risk of colon cancer recurrence is high.  -She tried adjuvant chemotherapy Xeloda, but could not tolerate full dose, and last cycle chemotherapy was canceled due to her tolerance issue. -I reviewed her restaging CT scan from 08/2015, which showed a 2 new peritoneal nodule, suspicious for peritoneal carcinomatosis. She is not symptomatic now. -Her to follow-up her CEA has also increased lately, concerning for cancer recurrence. -I reviewed her recent PET scan from 10/10/2015, which unfortunately showed multiple enlarging, hypermetabolic peritoneal nodules consistent with peritoneal carcinomatosis. No ascites or other metastasis. -I recommend her to consider peritoneal nodule biopsy, if she would like to try any systemic therapy. -If she has confirmed metastatic disease, then her disease is not curable, and her goal of care is palliation. Due to her advanced age, and poor tolerance to chemotherapy, she is not a candidate for chemotherapy , she certainly is not a candidate for debulking surgery and HPEC.  -If her tumor is RAS with type, she may be a candidate for the Cabmet trial at [facility], which offers cabozantinib + panitumumab for metastatic colon cancer with RAS wild type and no prior EGFR inhibitor  -Her tumor has MSI stable, not a good candidate for immunotherapy alone  -After lengthy discussion, patient and her daughter agrees to proceed with peritoneal nodule biopsy, and [facility] genomic test  2. Peripheral neuropathy, G 1 - noticed from second cycle of Xeloda, improved some since he came off chemotherapy.  3. GERD -she'll continue Protonix.  4.Lung nodules  -She has two small right lung nodules which were discovered on the CT scan in 09/2012,  RUL nodule slightly bigger on recent  CT -She has been follow-up with Dr. Cyndia Bent.  -she never smoked, low risk for lung cancer -given the stability of the RUL nodule over 2 years, this is likely a benign lesion    Plan -Peritoneal nodule biopsy by interventional radiology, we'll send her tumor tissue for [facility]  -I'll see her  back in 4 weeks  All questions were answered. The patient knows to call the clinic with any problems, questions or concerns.  I spent 20 minutes counseling the patient face to face. The total time spent in the appointment was 25  minutes and more than 50% was on counseling.     Truitt Merle, MD 10/20/2015

## 2015-10-20 NOTE — Telephone Encounter (Signed)
Gave patient avs report and appointments for October. Central radiology will call re bx - patient aware.

## 2015-10-23 ENCOUNTER — Encounter: Payer: Self-pay | Admitting: Hematology

## 2015-10-31 ENCOUNTER — Ambulatory Visit (HOSPITAL_COMMUNITY): Payer: PPO

## 2015-11-02 ENCOUNTER — Other Ambulatory Visit: Payer: Self-pay | Admitting: Radiology

## 2015-11-03 ENCOUNTER — Ambulatory Visit (HOSPITAL_COMMUNITY): Payer: PPO

## 2015-11-03 ENCOUNTER — Other Ambulatory Visit: Payer: Self-pay | Admitting: Physician Assistant

## 2015-11-04 ENCOUNTER — Ambulatory Visit (HOSPITAL_COMMUNITY)
Admission: RE | Admit: 2015-11-04 | Discharge: 2015-11-04 | Disposition: A | Discharge status: Home / Self Care | Payer: PPO | Source: Ambulatory Visit | Attending: Hematology | Admitting: Hematology

## 2015-11-04 ENCOUNTER — Encounter (HOSPITAL_COMMUNITY): Payer: Self-pay

## 2015-11-04 DIAGNOSIS — C182 Malignant neoplasm of ascending colon: Secondary | ICD-10-CM | POA: Insufficient documentation

## 2015-11-04 DIAGNOSIS — C19 Malignant neoplasm of rectosigmoid junction: Secondary | ICD-10-CM | POA: Diagnosis not present

## 2015-11-04 DIAGNOSIS — C786 Secondary malignant neoplasm of retroperitoneum and peritoneum: Secondary | ICD-10-CM | POA: Diagnosis not present

## 2015-11-04 DIAGNOSIS — R1909 Other intra-abdominal and pelvic swelling, mass and lump: Secondary | ICD-10-CM | POA: Diagnosis not present

## 2015-11-04 LAB — CBC
HCT: 40 % (ref 36.0–46.0)
Hemoglobin: 13.2 g/dL (ref 12.0–15.0)
MCH: 30.1 pg (ref 26.0–34.0)
MCHC: 33 g/dL (ref 30.0–36.0)
MCV: 91.3 fL (ref 78.0–100.0)
PLATELETS: 144 10*3/uL — AB (ref 150–400)
RBC: 4.38 MIL/uL (ref 3.87–5.11)
RDW: 13.5 % (ref 11.5–15.5)
WBC: 6 10*3/uL (ref 4.0–10.5)

## 2015-11-04 LAB — APTT: aPTT: 32 seconds (ref 24–36)

## 2015-11-04 LAB — PROTIME-INR
INR: 1.11
PROTHROMBIN TIME: 14.4 s (ref 11.4–15.2)

## 2015-11-04 MED ORDER — HYDROCODONE-ACETAMINOPHEN 5-325 MG PO TABS
1.0000 | ORAL_TABLET | ORAL | Status: DC | PRN
  Administered 2015-11-04: 1 via ORAL
  Filled 2015-11-04: qty 2
  Filled 2015-11-04: qty 1
  Filled 2015-11-04: qty 2

## 2015-11-04 MED ORDER — FENTANYL CITRATE (PF) 100 MCG/2ML IJ SOLN
INTRAMUSCULAR | Status: AC
  Filled 2015-11-04: qty 2

## 2015-11-04 MED ORDER — FLUMAZENIL 0.5 MG/5ML IV SOLN
INTRAVENOUS | Status: AC
  Filled 2015-11-04: qty 5

## 2015-11-04 MED ORDER — FENTANYL CITRATE (PF) 100 MCG/2ML IJ SOLN
INTRAMUSCULAR | Status: AC | PRN
  Administered 2015-11-04: 25 ug via INTRAVENOUS
  Administered 2015-11-04: 50 ug via INTRAVENOUS

## 2015-11-04 MED ORDER — MIDAZOLAM HCL 2 MG/2ML IJ SOLN
INTRAMUSCULAR | Status: AC
  Filled 2015-11-04: qty 4

## 2015-11-04 MED ORDER — NALOXONE HCL 0.4 MG/ML IJ SOLN
INTRAMUSCULAR | Status: AC
  Filled 2015-11-04: qty 1

## 2015-11-04 MED ORDER — SODIUM CHLORIDE 0.9 % IV SOLN
INTRAVENOUS | Status: DC
  Administered 2015-11-04: 10:00:00 via INTRAVENOUS

## 2015-11-04 MED ORDER — MIDAZOLAM HCL 2 MG/2ML IJ SOLN
INTRAMUSCULAR | Status: AC | PRN
  Administered 2015-11-04: 0.5 mg via INTRAVENOUS
  Administered 2015-11-04: 1 mg via INTRAVENOUS

## 2015-11-04 NOTE — Discharge Instructions (Signed)
Moderate Conscious Sedation, Adult °Sedation is the use of medicines to promote relaxation and relieve discomfort and anxiety. Moderate conscious sedation is a type of sedation. Under moderate conscious sedation you are less alert than normal but are still able to respond to instructions or stimulation. Moderate conscious sedation is used during short medical and dental procedures. It is milder than deep sedation or general anesthesia and allows you to return to your regular activities sooner. °LET YOUR HEALTH CARE PROVIDER KNOW ABOUT:  °· Any allergies you have. °· All medicines you are taking, including vitamins, herbs, eye drops, creams, and over-the-counter medicines. °· Use of steroids (by mouth or creams). °· Previous problems you or members of your family have had with the use of anesthetics. °· Any blood disorders you have. °· Previous surgeries you have had. °· Medical conditions you have. °· Possibility of pregnancy, if this applies. °· Use of cigarettes, alcohol, or illegal drugs. °RISKS AND COMPLICATIONS °Generally, this is a safe procedure. However, as with any procedure, problems can occur. Possible problems include: °· Oversedation. °· Trouble breathing on your own. You may need to have a breathing tube until you are awake and breathing on your own. °· Allergic reaction to any of the medicines used for the procedure. °BEFORE THE PROCEDURE °· You may have blood tests done. These tests can help show how well your kidneys and liver are working. They can also show how well your blood clots. °· A physical exam will be done.   °· Only take medicines as directed by your health care provider. You may need to stop taking medicines (such as blood thinners, aspirin, or nonsteroidal anti-inflammatory drugs) before the procedure.   °· Do not eat or drink at least 6 hours before the procedure or as directed by your health care provider. °· Arrange for a responsible adult, family member, or friend to take you home  after the procedure. He or she should stay with you for at least 24 hours after the procedure, until the medicine has worn off. °PROCEDURE  °· An intravenous (IV) catheter will be inserted into one of your veins. Medicine will be able to flow directly into your body through this catheter. You may be given medicine through this tube to help prevent pain and help you relax. °· The medical or dental procedure will be done. °AFTER THE PROCEDURE °· You will stay in a recovery area until the medicine has worn off. Your blood pressure and pulse will be checked.   °·  Depending on the procedure you had, you may be allowed to go home when you can tolerate liquids and your pain is under control. °  °This information is not intended to replace advice given to you by your health care provider. Make sure you discuss any questions you have with your health care provider. °  °Document Released: 10/24/2000 Document Revised: 02/19/2014 Document Reviewed: 10/06/2012 °Elsevier Interactive Patient Education ©2016 [employer] °Needle Biopsy, Care After °These instructions give you information about caring for yourself after your procedure. Your doctor may also give you more specific instructions. Call your doctor if you have any problems or questions after your procedure. °HOME CARE °· Rest as told by your doctor. °· Take medicines only as told by your doctor. °· There are many different ways to close and cover the biopsy site, including stitches (sutures), skin glue, and adhesive strips. Follow instructions from your doctor about: °· How to take care of your biopsy site. °· When and how you should change your   bandage (dressing). °· When you should remove your dressing. °· Removing whatever was used to close your biopsy site. °· Check your biopsy site every day for signs of infection. Watch for: °· Redness, swelling, or pain. °· Fluid, blood, or pus. °GET HELP IF: °· You have a fever. °· You have redness, swelling, or pain at the  biopsy site, and it lasts longer than a few days. °· You have fluid, blood, or pus coming from the biopsy site. °· You feel sick to your stomach (nauseous). °· You throw up (vomit). °GET HELP RIGHT AWAY IF: °· You are short of breath. °· You have trouble breathing. °· Your chest hurts. °· You feel dizzy or you pass out (faint). °· You have bleeding that does not stop with pressure or a bandage. °· You cough up blood. °· Your belly (abdomen) hurts. °  °This information is not intended to replace advice given to you by your health care provider. Make sure you discuss any questions you have with your health care provider. °  °Document Released: 01/12/2008 Document Revised: 06/15/2014 Document Reviewed: 01/25/2014 °Elsevier Interactive Patient Education ©2016 [employer] ° °

## 2015-11-04 NOTE — Procedures (Signed)
CT biopsy R peritoneal nodule 18g x2 to surg path No complication No blood loss. See complete dictation in [facility].

## 2015-11-04 NOTE — Consult Note (Signed)
Chief Complaint: Patient was seen in consultation today for image guided biopsy of peritoneal nodule  Referring Physician(s): Feng,Yan  Supervising Physician: Arne Cleveland  Patient Status: Outpatient  History of Present Illness: Heidi Marquez is a 82 y.o. female with history of colon cancer 2016, status post right hemicolectomy along with sigmoidectomy. Status post chemotherapy. Now with rising CEA and PET scan revealing multiple enlarging hypermetabolic peritoneal nodules. She presents today for image guided biopsy of peritoneal nodule for further evaluation.  Past Medical History:  Diagnosis Date  . Allergy   . Arthritis    knees  . Benign esophageal stricture 04/2007   EGD with dilatation.  . Cataract    ight eye starting  . Colon cancer ([place])     COLON CANCER / HX SKIN CANCER  . Diverticulosis of colon    severe in sigmoid but diffusley throughout colon  on 2009 colonoscopy  . Epistaxis ~ 2005   required cauterization twice.   Marland Kitchen GERD (gastroesophageal reflux disease)   . Hematochezia 2009  . HH (hiatus hernia)   . History of skin cancer   . IBS (irritable bowel syndrome)   . Internal hemorrhoids   . Macular degeneration   . Reflux esophagitis     Past Surgical History:  Procedure Laterality Date  . ABDOMINAL HYSTERECTOMY     partial with the bladder tack  . basal cell resection  08/2011, 11/2011   initially biopsy 08/3011.  repeat excision right nose with grafting from the skin behind the right ear  . bladder tack    . COLON SURGERY  APRIL 2016   SIGMOIDECTOMY  . COLONOSCOPY    . COLONOSCOPY WITH PROPOFOL N/A 09/30/2014   Procedure: COLONOSCOPY WITH PROPOFOL;  Surgeon: Milus Banister, MD;  Location: [facility];  Service: Endoscopy;  Laterality: N/A;  . ESOPHAGOGASTRODUODENOSCOPY    . INGUINAL HERNIA REPAIR  early 1990s   right  . retinal laser surgery    . UPPER GASTROINTESTINAL ENDOSCOPY      Allergies: Penicillins; Sulfonamide derivatives;  and Amoxicillin-pot clavulanate  Medications: Prior to Admission medications   Medication Sig Start Date End Date Taking? Authorizing Provider  alendronate (FOSAMAX) 70 MG tablet Take 70 mg by mouth every 7 (seven) days. Tuesdays. Take with a full glass of water on an empty stomach.    Historical Provider, MD  Ascorbic Acid (VITAMIN C) 1000 MG tablet Take 1,000 mg by mouth daily.    Historical Provider, MD  Biotin 1000 MCG tablet Take 1,000 mcg by mouth daily.     Historical Provider, MD  CALCIUM & MAGNESIUM CARBONATES PO Take 1 tablet by mouth daily.    Historical Provider, MD  cholecalciferol (VITAMIN D) 1000 UNITS tablet Take 1,000 Units by mouth daily.    Historical Provider, MD  diphenoxylate-atropine (LOMOTIL) 2.5-0.025 MG tablet Take 1-2 tablets by mouth 4 (four) times daily as needed for diarrhea or loose stools. Patient not taking: Reported on 10/20/2015 05/02/15   Truitt Merle, MD  glucosamine-chondroitin 500-400 MG tablet Take 1 tablet by mouth daily.     Historical Provider, MD  Homeopathic Products ([employer] LEG CRAMPS PAIN RELIEF PO) Take 1 tablet by mouth daily as needed (for leg cramps).    Historical Provider, MD  HYDROcodone-acetaminophen (NORCO/VICODIN) 5-325 MG tablet Take 1-2 tablets by mouth every 6 (six) hours as needed. Patient not taking: Reported on 10/20/2015 12/11/14   Alphonsa Overall, MD  hydrocortisone 1 % lotion Apply 1 application topically 2 (two) times daily.  Apply to hands Patient not taking: Reported on 10/20/2015 04/21/15   Truitt Merle, MD  ibuprofen (ADVIL,MOTRIN) 200 MG tablet Take 200 mg by mouth daily as needed for moderate pain.     Historical Provider, MD  loratadine (CLARITIN) 10 MG tablet Take 10 mg by mouth daily.    Historical Provider, MD  Multiple Vitamin (MULTIVITAMIN) tablet Take 1 tablet by mouth daily.    Historical Provider, MD  potassium chloride SA (K-DUR,KLOR-CON) 20 MEQ tablet Take 1 tablet (20 mEq total) by mouth daily. Patient not taking: Reported on  08/22/2015 05/02/15   Truitt Merle, MD  urea (CARMOL) 10 % cream Apply topically 2 (two) times daily. Apply to hand twice daily 04/21/15   Truitt Merle, MD  vitamin B-12 (CYANOCOBALAMIN) 1000 MCG tablet Take 1,000 mcg by mouth daily.     Historical Provider, MD     Family History  Problem Relation Age of Onset  . Colon cancer Paternal Grandfather 31    colon cancer   . Esophageal cancer Neg Hx   . Rectal cancer Neg Hx   . Stomach cancer Neg Hx   . Stroke Father   . Heart attack Son 14    died of heart attack     Social History   Social History  . Marital status: Married    Spouse name: N/A  . Number of children: N/A  . Years of education: N/A   Social History Main Topics  . Smoking status: Passive Smoke Exposure - Never Smoker  . Smokeless tobacco: Never Used  . Alcohol use No  . Drug use: No  . Sexual activity: Not on file   Other Topics Concern  . Not on file   Social History Narrative   Married, husband is disabled (physical and dementia) and she is caregiver   Has #1 child (daughter) living. Son died of MI at age 51   Worked at [employer] before she retired   Has #3 cats      Review of Systems patient currently denies headache, fever, chest pain, dyspnea, abdominal/back pain, nausea, vomiting or abnormal bleeding. She does have occasional cough.  Vital Signs: BP (!) 163/84 (BP Location: Left Arm) Comment: RN notified  Pulse 60   Temp 97.8 F (36.6 C) (Oral)   Resp 16   SpO2 97%   Physical Exam awake, alert. Chest clear to auscultation bilaterally. Heart with regular rate and rhythm, ? soft murmur; abdomen soft, positive bowel sounds, nontender. Lower extremities with no edema.  Mallampati Score:     Imaging: Nm Pet Image Restag (ps) Skull Base To Thigh  Result Date: 10/10/2015 CLINICAL DATA:  Subsequent treatment strategy for colon cancer. Abnormal CT. EXAM: NUCLEAR MEDICINE PET SKULL BASE TO THIGH TECHNIQUE: 6.58 mCi F-18 FDG was injected intravenously.  Full-ring PET imaging was performed from the skull base to thigh after the radiotracer. CT data was obtained and used for attenuation correction and anatomic localization. FASTING BLOOD GLUCOSE:  Value: 93 mg/dl COMPARISON:  PET-CT 10/07/2012. Abdominal pelvic CT 08/15/2015. Chest CT 06/15/2015. FINDINGS: NECK No hypermetabolic cervical lymph nodes are identified.There are no lesions of the pharyngeal mucosal space. Multinodular goiter again noted with asymmetric enlargement of the left lobe. No suspicious hypermetabolic thyroid activity. CHEST There are no hypermetabolic mediastinal, hilar or axillary lymph nodes. No suspicious pulmonary activity. Probable focus of postinflammatory scarring in the right upper lobe along the superior aspect of the major fissure is again noted. Scattered areas of pulmonary parenchymal scarring are present.  There are no suspicious pulmonary nodules. ABDOMEN/PELVIS There is no hypermetabolic activity within the liver, adrenal glands, spleen or pancreas. There is no hypermetabolic nodal activity. There are multiple hypermetabolic peritoneal nodules consistent with peritoneal carcinomatosis. The largest nodule anteriorly in the midline, inferior to the umbilicus measures 18 x 15 mm on image 146 and has an SUV max of 8.3. Other representative nodules measure 13 mm adjacent to the right colon on image 132 (SUV max 6.8) and there is a collection of small nodules in the right pericolic gutter which have enlarged and demonstrated SUV max of 6.0. There is no ascites. Small gallstones, a small splenic cyst and minimal atherosclerosis noted. There are postsurgical changes in the proximal and distal colon. SKELETON There is no hypermetabolic activity to suggest osseous metastatic disease. IMPRESSION: 1. Multiple enlarging, hypermetabolic peritoneal nodules consistent with peritoneal carcinomatosis. No generalized ascites. 2. No evidence of metastatic disease to the liver. 3. No suspicious  pulmonary findings. Grossly stable chronic postinflammatory changes. 4. Multinodular goiter. Electronically Signed   By: Richardean Sale M.D.   On: 10/10/2015 16:42    Labs:  CBC:  Recent Labs  05/02/15 1241 05/23/15 1256 06/27/15 1226 08/15/15 1337  WBC 7.2 7.1 5.8 6.1  HGB 11.6 12.2 12.5 12.9  HCT 33.8* 36.3 36.9 39.2  PLT 177 144* 156 266    COAGS: No results for input(s): INR, APTT in the last 8760 hours.  BMP:  Recent Labs  12/03/14 1130 12/09/14 0452 12/10/14 0537 12/11/14 0601  05/02/15 1241 05/23/15 1256 06/27/15 1226 08/15/15 1337  NA 144 138 139 140  < > 142 141 145 143  K 4.8 3.5 3.8 4.3  < > 3.4* 4.3 4.4 4.3  CL 108 105 105 106  --   --   --   --   --   CO2 30 27 28 29   < > 28 29 32* 29  GLUCOSE 126* 112* 113* 99  < > 73 92 104 76  BUN 24* 14 7 12   < > 13.2 17.7 17.9 16.9  CALCIUM 9.4 7.6* 8.3* 8.4*  < > 8.7 9.3 9.5 10.0  CREATININE 0.85 0.91 0.72 0.85  < > 0.8 0.9 0.9 1.0  GFRNONAA >60 58* >60 >60  --   --   --   --   --   GFRAA >60 >60 >60 >60  --   --   --   --   --   < > = values in this interval not displayed.  LIVER FUNCTION TESTS:  Recent Labs  05/02/15 1241 05/23/15 1256 06/27/15 1226 08/15/15 1337  BILITOT 1.59* 1.31* 1.01 0.54  AST 22 25 24 21   ALT 14 16 17 13   ALKPHOS 89 78 90 129  PROT 6.4 6.5 6.5 7.7  ALBUMIN 3.4* 3.6 3.6 3.8    TUMOR MARKERS:  Recent Labs  01/31/15 1120 02/03/15 0832 05/02/15 1241 08/15/15 1337  CEA 1.6 1.4 1.4 5.8*    Assessment and Plan: 82 y.o. female with history of colon cancer 2016, status post right hemicolectomy along with sigmoidectomy. Status post chemotherapy. Now with rising CEA and PET scan revealing multiple enlarging hypermetabolic peritoneal nodules. She presents today for image guided biopsy of peritoneal nodule for further evaluation.Risks and benefits discussed with the patient/daughter including, but not limited to bleeding, infection, damage to adjacent structures or low yield  requiring additional tests.All of the patient's questions were answered, patient is agreeable to proceed.Consent signed and in chart.  Thank you for this interesting consult.  I greatly enjoyed meeting AMAIRAH KOELLNER and look forward to participating in their care.  A copy of this report was sent to the requesting provider on this date.  Electronically Signed: D. Rowe Robert 11/04/2015, 9:23 AM   I spent a total of  25 minutes    in face to face in clinical consultation, greater than 50% of which was counseling/coordinating care for image guided biopsy of peritoneal nodule

## 2015-11-07 ENCOUNTER — Encounter: Payer: Self-pay | Admitting: *Deleted

## 2015-11-07 NOTE — Progress Notes (Signed)
  Oncology Nurse Navigator Documentation  Navigator Location: [facility] (11/07/15 1520) Navigator Encounter Type: Letter/Fax/Email (11/07/15 1520)               Barriers/Navigation Needs: Coordination of Care (11/07/15 1520)   Interventions: Coordination of Care (11/07/15 1520)   Email request for [facility]  testing on the following case per Dr. Burr Medico request:    Patient: Heidi Marquez Collected: 11/04/2015 Client: [facility] Accession: [id number] Received: 11/04/2015 D. Arne Cleveland DOB: 04/15/1933 Age: 82 Gender: F Reported: 11/07/2015 [place] Patient Ph: [phone] MRN #: [id number] [place], [place] [address] Visit #: [id number] Chart #: Phone: [phone] Fax: CC: Truitt Merle, MD REPORT

## 2015-11-23 ENCOUNTER — Telehealth: Payer: Self-pay | Admitting: *Deleted

## 2015-11-23 NOTE — Telephone Encounter (Signed)
Called pt at home and left message on voice mail informing pt of appt with Dr. Burr Medico on Fri 10/13 at 1015 am.  Requested a call back from pt to confirm that pt received message.

## 2015-11-25 ENCOUNTER — Telehealth: Payer: Self-pay | Admitting: Hematology

## 2015-11-25 ENCOUNTER — Encounter (HOSPITAL_COMMUNITY): Payer: Self-pay

## 2015-11-25 ENCOUNTER — Ambulatory Visit (HOSPITAL_BASED_OUTPATIENT_CLINIC_OR_DEPARTMENT_OTHER): Payer: PPO | Admitting: Hematology

## 2015-11-25 VITALS — BP 153/75 | HR 62 | Temp 98.5°F | Resp 17 | Ht 63.5 in | Wt 139.1 lb

## 2015-11-25 DIAGNOSIS — C187 Malignant neoplasm of sigmoid colon: Secondary | ICD-10-CM

## 2015-11-25 DIAGNOSIS — C182 Malignant neoplasm of ascending colon: Secondary | ICD-10-CM

## 2015-11-25 DIAGNOSIS — R911 Solitary pulmonary nodule: Secondary | ICD-10-CM | POA: Diagnosis not present

## 2015-11-25 DIAGNOSIS — C186 Malignant neoplasm of descending colon: Secondary | ICD-10-CM

## 2015-11-25 DIAGNOSIS — K219 Gastro-esophageal reflux disease without esophagitis: Secondary | ICD-10-CM

## 2015-11-25 NOTE — Progress Notes (Addendum)
- [place]  [phone] [phone]  Clinic Follow Up Note   Patient Care Team: Prince Solian, MD as PCP - General (Internal Medicine) Gaye Pollack, MD as Consulting Physician (Cardiothoracic Surgery) Gatha Mayer, MD as Consulting Physician (Gastroenterology) Leighton Ruff, MD as Consulting Physician (General Surgery) Tania Ade, RN as Registered Nurse (Medical Oncology) 11/25/2015  CHIEF COMPLAINTS:  Follow-up of colon cancer  Oncology History   Cancer of left colon [facility])   Staging form: Colon and Rectum, AJCC 7th Edition     Pathologic stage from 05/17/2014: Stage IIA (T3, N0, cM0) - Signed by Truitt Merle, MD on 02/21/2015 Cancer of right colon [facility])   Staging form: Colon and Rectum, AJCC 7th Edition     Pathologic stage from 12/08/2014: Stage IIIB (T4a, N1a, cM0) - Signed by Truitt Merle, MD on 02/21/2015       Cancer of right colon ([place])   04/09/2014 Procedure    Colonoscopy per Dr. Silvano Rusk: Large fleshy mass sigmoid colon 25 cm from anus. Could not pass through.      04/09/2014 Tumor Marker    CEA =11.7      04/12/2014 Imaging    CT C/A/P:sigmoid colon mass;several small areas of soft tissue nodularity in peritoneal cavity; small attenuation structure posterior right hepatic lobe; 2 stable nodules in lungs dating back to 2014. No acute findings.      05/17/2014 Initial Diagnosis    Colon cancer-presented with heme + stool and more frequent BMs      05/17/2014 Surgery    Robot assisted sigmoidectomy      05/17/2014 Pathologic Stage    pT3,pN0,pMX--Grade 1--0/13 nodes +--MMR normal      05/17/2014 Clinical Stage    Stage II      12/08/2014 Surgery    right hemicolectomy       12/08/2014 Pathology Results    right colon adenocarcinoma, pT4aN1c, 19 nodes were negative, LVI(+), MMR normal       01/17/2015 - 06/27/2015 Chemotherapy    adjuvant capecitabine 1531m q12h, 2 weeks on and 1 week off. dose reduction and stopped  after 6 cycles due to poor tolerance,       08/15/2015 Progression    CT abdomen showed peritoneal nodules, highly suspicious for recurrent metastasis      10/10/2015 Imaging    PET scan showed multiple enlarging, hypermetabolic peritoneal nodules consistent with peritoneal carcinomatosis. No other evidence of metastasis       HISTORY OF PRESENTING ILLNESS:  Heidi SEARLES82y.o. female is here because of recently diagnosed a stage II sigmoid colon cancer.  She had annual physical with her PCP in early Feb this year, and stool OB (+), no overt GI bleeding, CBC was normal. She had noticed some loose BM before that, but no nausea, loss of appetite, weight loss or other symoptoms. Colonoscopy showed a large mass in the sigmoid colon 25 cm from anus. Biopsy was taken which showed tubulovillous adenoma, no malignancy. She was referred to surgeon Dr. TMarcello Mooresand underwent robotic-assisted sigmoidectomy on 4/4. She tolerated the procedure well, and was discharged home afterwards.  She has recovered well from the surgery 3 weeks ago, she has mild fatigue, but otherwise doing very well without other complains. She had 2-3 episodes of bleeding after the surgery, which was related to her herromods. She is eating well. 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   She has  chronic knee pain she takes ibuprofen once daily.    CURRENT THERAPY: Surveillance  INTERIM HISTORY: [name] returns for follow-up. She Is accompanied by her Cousin to the clinic today. She underwent peritoneal mass biopsy by interventional radiology 3 weeks ago, and tolerated the procedure very well. She feels well overall, denies any significant pain, abdominal bloating, nausea, change of her bowel habits, or other complaints. She has good appetite and energy level, she remains to be physically active, takes care of her husband at home, and her weight is stable.  MEDICAL HISTORY:   Past Medical History:  Diagnosis Date  . Allergy   . Arthritis    knees  . Benign esophageal stricture 04/2007   EGD with dilatation.  . Cataract    ight eye starting  . Colon cancer ([place])     COLON CANCER / HX SKIN CANCER  . Diverticulosis of colon    severe in sigmoid but diffusley throughout colon  on 2009 colonoscopy  . Epistaxis ~ 2005   required cauterization twice.   Marland Kitchen GERD (gastroesophageal reflux disease)   . Hematochezia 2009  . HH (hiatus hernia)   . History of skin cancer   . IBS (irritable bowel syndrome)   . Internal hemorrhoids   . Macular degeneration   . Reflux esophagitis     SURGICAL HISTORY: Past Surgical History:  Procedure Laterality Date  . ABDOMINAL HYSTERECTOMY     partial with the bladder tack  . basal cell resection  08/2011, 11/2011   initially biopsy 08/3011.  repeat excision right nose with grafting from the skin behind the right ear  . bladder tack    . COLON SURGERY  APRIL 2016   SIGMOIDECTOMY  . COLONOSCOPY    . COLONOSCOPY WITH PROPOFOL N/A 09/30/2014   Procedure: COLONOSCOPY WITH PROPOFOL;  Surgeon: Milus Banister, MD;  Location: [facility];  Service: Endoscopy;  Laterality: N/A;  . ESOPHAGOGASTRODUODENOSCOPY    . INGUINAL HERNIA REPAIR  early 1990s   right  . retinal laser surgery    . UPPER GASTROINTESTINAL ENDOSCOPY      SOCIAL HISTORY: Social History   Social History  . Marital status: Married    Spouse name: N/A  . Number of children: N/A  . Years of education: N/A   Occupational History  . Not on file.   Social History Main Topics  . Smoking status: Passive Smoke Exposure - Never Smoker  . Smokeless tobacco: Never Used  . Alcohol use No  . Drug use: No  . Sexual activity: Not on file   Other Topics Concern  . Not on file   Social History Narrative   Married, husband is disabled (physical and dementia) and she is caregiver   Has #1 child (daughter) living. Son died of MI at age 53   Worked at [employer] before she retired   Has #3 cats    FAMILY HISTORY: Family History  Problem Relation Age of Onset  . Colon cancer Paternal Grandfather 28    colon cancer   . Stroke Father   . Heart attack Son 92    died of heart attack   . Esophageal cancer Neg Hx   . Rectal cancer Neg Hx   . Stomach cancer Neg Hx     ALLERGIES:  is allergic to penicillins; sulfonamide derivatives; and amoxicillin-pot clavulanate.  MEDICATIONS:  Current Outpatient Prescriptions  Medication Sig Dispense Refill  . alendronate (FOSAMAX) 70 MG tablet Take 70 mg by mouth  every 7 (seven) days. Sundays. Take with a full glass of water on an empty stomach.    . Ascorbic Acid (VITAMIN C) 1000 MG tablet Take 1,000 mg by mouth daily.    . Biotin 1000 MCG tablet Take 1,000 mcg by mouth daily.     Marland Kitchen CALCIUM & MAGNESIUM CARBONATES PO Take 1 tablet by mouth daily.    . cholecalciferol (VITAMIN D) 1000 UNITS tablet Take 1,000 Units by mouth daily.    . diphenoxylate-atropine (LOMOTIL) 2.5-0.025 MG tablet Take 1-2 tablets by mouth 4 (four) times daily as needed for diarrhea or loose stools. (Patient not taking: Reported on 11/04/2015) 30 tablet 2  . glucosamine-chondroitin 500-400 MG tablet Take 1 tablet by mouth daily.     . Homeopathic Products ([employer] LEG CRAMPS PAIN RELIEF PO) Take 1 tablet by mouth daily as needed (for leg cramps).    Marland Kitchen HYDROcodone-acetaminophen (NORCO/VICODIN) 5-325 MG tablet Take 1-2 tablets by mouth every 6 (six) hours as needed. (Patient not taking: Reported on 11/04/2015) 30 tablet 0  . hydrocortisone 1 % lotion Apply 1 application topically 2 (two) times daily. Apply to hands (Patient not taking: Reported on 11/04/2015) 118 mL 0  . ibuprofen (ADVIL,MOTRIN) 200 MG tablet Take 200 mg by mouth daily as needed for moderate pain.     Marland Kitchen loratadine (CLARITIN) 10 MG tablet Take 10 mg by mouth daily.    . Multiple Vitamin (MULTIVITAMIN) tablet Take 1 tablet by mouth daily.    . potassium chloride SA  (K-DUR,KLOR-CON) 20 MEQ tablet Take 1 tablet (20 mEq total) by mouth daily. (Patient not taking: Reported on 11/04/2015) 20 tablet 1  . urea (CARMOL) 10 % cream Apply topically 2 (two) times daily. Apply to hand twice daily 142 g 0  . vitamin B-12 (CYANOCOBALAMIN) 1000 MCG tablet Take 1,000 mcg by mouth daily.      No current facility-administered medications for this visit.     REVIEW OF SYSTEMS:   Constitutional: Denies fevers, chills or abnormal night sweats Eyes: Denies blurriness of vision, double vision or watery eyes Ears, nose, mouth, throat, and face: Denies mucositis or sore throat Respiratory: Denies cough, dyspnea or wheezes Cardiovascular: Denies palpitation, chest discomfort or lower extremity swelling Gastrointestinal:  Denies nausea, heartburn or change in bowel habits Skin: Denies abnormal skin rashes Lymphatics: Denies new lymphadenopathy or easy bruising Neurological:Denies numbness, tingling or new weaknesses Behavioral/Psych: Mood is stable, no new changes  All other systems were reviewed with the patient and are negative.  PHYSICAL EXAMINATION: ECOG PERFORMANCE STATUS: 1  Vitals:   11/25/15 1028  BP: (!) 153/75  Pulse: 62  Resp: 17  Temp: 98.5 F (36.9 C)   Filed Weights   11/25/15 1028  Weight: 139 lb 1.6 oz (63.1 kg)    GENERAL:alert, no distress and comfortable SKIN: skin color, texture, turgor are normal, no rashes or significant lesions except skin redness on palms and bottom of her feet, with skin peeling, a few skin rashes on her hands and arms  EYES: normal, conjunctiva are pink and non-injected, sclera clear OROPHARYNX:no exudate, no erythema and lips, buccal mucosa, and tongue normal  NECK: supple, thyroid normal size, non-tender, without nodularity LYMPH:  no palpable lymphadenopathy in the cervical, axillary or inguinal LUNGS: clear to auscultation and percussion with normal breathing effort HEART: regular rate & rhythm and no murmurs and no  lower extremity edema ABDOMEN:abdomen soft, surgical incision and to the low abdomen is well healed. non-tender and normal bowel sounds Musculoskeletal:no cyanosis  of digits and no clubbing  PSYCH: alert & oriented x 3 with fluent speech NEURO: no focal motor/sensory deficits  LABORATORY DATA:  I have reviewed the data as listed CBC Latest Ref Rng & Units 11/04/2015 08/15/2015 06/27/2015  WBC 4.0 - 10.5 K/uL 6.0 6.1 5.8  Hemoglobin 12.0 - 15.0 g/dL 13.2 12.9 12.5  Hematocrit 36.0 - 46.0 % 40.0 39.2 36.9  Platelets 150 - 400 K/uL 144(L) 266 156   CMP Latest Ref Rng & Units 08/15/2015 06/27/2015 05/23/2015  Glucose 70 - 140 mg/dl 76 104 92  BUN 7.0 - 26.0 mg/dL 16.9 17.9 17.7  Creatinine 0.6 - 1.1 mg/dL 1.0 0.9 0.9  Sodium 136 - 145 mEq/L 143 145 141  Potassium 3.5 - 5.1 mEq/L 4.3 4.4 4.3  Chloride 101 - 111 mmol/L - - -  CO2 22 - 29 mEq/L 29 32(H) 29  Calcium 8.4 - 10.4 mg/dL 10.0 9.5 9.3  Total Protein 6.4 - 8.3 g/dL 7.7 6.5 6.5  Total Bilirubin 0.20 - 1.20 mg/dL 0.54 1.01 1.31(H)  Alkaline Phos 40 - 150 U/L 129 90 78  AST 5 - 34 U/L '21 24 25  ' ALT 0 - 55 U/L '13 17 16   ' Results for JETTIE, MANNOR (MRN [id number]) as of 10/20/2015 16:14  Ref. Range 02/03/2015 08:32 05/02/2015 12:41 08/15/2015 13:37  CEA Latest Units: ng/mL 1.4 1.4 5.8 (H)  CEA Latest Ref Range: 0.0 - 4.7 ng/mL  2.6 9.6 (H)        Pathology report: Diagnosis 05/17/2014 1. Colon, segmental resection for tumor, sigmoid, open end proximal - INVASIVE ADENOCARCINOMA WITH EXTRACELLULAR MUCIN, INVADING THROUGH THE MUSCULARIS PROPRIA INTO PERICOLONIC FATTY TISSUE. - THIRTEEN LYMPH NODES, NEGATIVE FOR METASTATIC CARCINOMA (0/13). - MULTIPLE DIVERTICULA. - RESECTION MARGINS, NEGATIVE FOR ATYPIA OR MALIGNANCY. - PLEASE SEE ONCOLOGY TEMPLATE FOR DETAILS. 2. Colon, resection margin (donut), sigmoid - BENIGN COLONIC MUCOSA, NO EVIDENCE OF DYSPLASIA OR MALIGNANCY. Specimen: Left colon. Procedure: Segmental resection. Tumor site:  Sigmoid colon. Specimen integrity: Intact. Macroscopic intactness of mesorectum: N/A Macroscopic tumor perforation: No Invasive tumor: Invasive adenocarcinoma with extracellular mucin. Maximum size: 8.0 cm, gross measurement. Histologic type(s): Invasive adenocarcinoma with extracellular mucin. Histologic grade and differentiation: G1: well differentiated/low grade Type of polyp in which invasive carcinoma arose: Villous adenoma. Microscopic extension of invasive tumor: Invading through the muscularis propria into pericolonic fatty tissue. Lymph-Vascular invasion: Not identified. Peri-neural invasion: Not identified. Tumor deposit(s) (discontinuous extramural extension): None. Resection margins: Negative. Proximal margin: 9.6 cm. Distal margin: 9.6 cm. Mesenteric margin (sigmoid and transverse): 6 cm. Treatment effect (neo-adjuvant therapy): No Additional polyp(s): N/A Non-neoplastic findings: Prominent Crohn-like reaction around the tumor. Lymph nodes: number examined 13; number positive: 0 Pathologic Staging: pT3, pN0, pMX  Diagnosis 12/08/2014  Colon, segmental resection for tumor, right colon MODERATELY DIFFERENTIATED ADENOCARCINOMA OF THE COLON (3.8 CM) THE TUMOR PENETRATES TO THE SURFACE OF THE VISCERAL PERITONEUM (T4A) LYMPHOVASCULAR INVASION IS IDENTIFIED MARGINS OF RESECTION ARE NEGATIVE FOR TUMOR NINTEEN BENIGN LYMPH NODE (0/19) ONE TUMOR DEPOSITE IS IDENTIFIED ([place]) Specimen: Colon Procedure: Segmental resection Tumor site: Ascending colon Specimen integrity: Intact Macroscopic intactness of mesorectum: Not applicable: Macroscopic tumor perforation: The tumor invades through the muscularis propria penatrate the surface of serosa Invasive tumor: Maximum size: 3.8 Histologic type(s): Adenocarcinoma G2 G2: moderately differentiated/low grade Type of polyp in which invasive carcinoma arose: Tubular adenoma Microscopic extension of invasive tumor:  Serosa Lymph-Vascular invasion: Present Peri-neural invasion: Negative Tumor deposit(s) (discontinuous extramural extension): Present Resection margins: Proximal margin: 12 cm Distal  margin: 23 cm Circumferential (radial) (posterior ascending, posterior descending; lateral and posterior mid-rectum; and entire lower 1/3 rectum):3.5 cm Mesenteric margin (sigmoid and transverse): NA Distance closest margin (if all above margins negative): _ Treatment effect (neo-adjuvant therapy): Negative Additional polyp(s): Negative Non-neoplastic findings: Unremarkabel Lymph nodes: number examined 19; number positive: 0 Pathologic Staging: T4a, N1c, M_  MMR: NORMAL MSI: STABLE  Mismatch Repair (MMR) Protein Immunohistochemistry (IHC) IHC Expression Result: MLH1: Preserved nuclear expression (greater 50% tumor expression) MSH2: Preserved nuclear expression (greater 50% tumor expression) MSH6: Preserved nuclear expression (greater 50% tumor expression) PMS2: Preserved nuclear expression (greater 50% tumor expression) * Internal control demonstrates intact nuclear expression Interpretation: NORMAL   RADIOGRAPHIC STUDIES: I have personally reviewed the radiological images as listed and agreed with the findings in the report.  PET 10/10/2015 IMPRESSION: 1. Multiple enlarging, hypermetabolic peritoneal nodules consistent with peritoneal carcinomatosis. No generalized ascites. 2. No evidence of metastatic disease to the liver. 3. No suspicious pulmonary findings. Grossly stable chronic postinflammatory changes. 4. Multinodular goiter.  Diagnosis 11/04/2015 Peritoneum, biopsy, RLQ - METASTATIC COLORECTAL ADENOCARCINOMA. - SEE COMMENT. Microscopic Comment Dr. Burr Medico was paged on 11/07/2015. Per request, a block will be sent to [facility] for additional testing and the results reported separately. (JBK:kh 11/07/2015)  ASSESSMENT & PLAN:  82 year old Caucasian female, with past medical  history of colon diverticulosis and arthritis, but otherwise healthy and active, who was found to have a large sigmoid tumor, status post sigmoidectomy.  1. Sigmoid colon adenocarcinoma, pT3N0M0, stage II, G1, MSI stable, ascending colon adenocarcinoma, pT4aN1cM0, stage IIIB, MSI stable, G2, recurrent peritoneal carcinomatosis in 08/2015 -She had multifocal (2) colon cancer status post complete surgical resection,  -I reviewed her recent surgical pathology results in great details with her. The right colon cancer has several high-risk features, including stage IIIB, T4a lesion, tumor deposits, lymphovascular invasion, her risk of colon cancer recurrence is high.  -She tried adjuvant chemotherapy Xeloda, but could not tolerate full dose, and last cycle chemotherapy was canceled due to her tolerance issue. -I reviewed her restaging CT scan from 08/2015, which showed a 2 new peritoneal nodule, suspicious for peritoneal carcinomatosis. She is not symptomatic now. -Her to follow-up her CEA has also increased lately, concerning for cancer recurrence. -I reviewed her recent PET scan from 10/10/2015, which unfortunately showed multiple enlarging, hypermetabolic peritoneal nodules consistent with peritoneal carcinomatosis. No ascites or other metastasis. -I discussed her recent peritoneal mass biopsy, which confirmed metastatic colon cancer  -We discussed that her cancer is not curable at this stage, she is not a candidate for debulking and HIPEC, and her goal of care is palliation.  -Her [facility] genomic testing results are still pending -If her tumor is RAS with type, she may be a candidate for the Cabmet trial at [facility], which offers cabozantinib + panitumumab for metastatic colon cancer with RAS wild type and no prior EGFR inhibitor  -Her tumor has MSI stable, not a good candidate for immunotherapy alone  -If her tumor has KARAS or NRAS mutation, I recommend her to consider Xeloda and Avastin -She would  like to have a second opinion at [facility], [place] set up her referral   2. Peripheral neuropathy, G 1 - noticed from second cycle of Xeloda, improved some since he came off chemotherapy.  3. GERD -she'll continue Protonix.  4.Lung nodules  -She has two small right lung nodules which were discovered on the CT scan in 09/2012,  RUL nodule slightly bigger on recent CT -She has been follow-up  with Dr. Cyndia Bent.  -she never smoked, low risk for lung cancer -given the stability of the RUL nodule over 2 years, this is likely a benign lesion    Plan -biopsy results discussed -I will call her and her daughter when I have her FO result back -refer her to [facility] Dr. Reynaldo Minium   All questions were answered. The patient knows to call the clinic with any problems, questions or concerns.  I spent 20 minutes counseling the patient face to face. The total time spent in the appointment was 25  minutes and more than 50% was on counseling.     Truitt Merle, MD 11/25/2015    Addendum, Her FO showed KRAS mutation (+), MSI-stable, no targeted therapy available. I called patient and left a message about the result. I did try to call her daughter Amy, but she did not answer.  Truitt Merle  11/29/2015

## 2015-11-25 NOTE — Telephone Encounter (Signed)
GAVE PATIENT AVS REPORT AND APPOINTMENTS FOR November.  °

## 2015-11-26 ENCOUNTER — Encounter: Payer: Self-pay | Admitting: Hematology

## 2015-12-06 DIAGNOSIS — H524 Presbyopia: Secondary | ICD-10-CM | POA: Diagnosis not present

## 2015-12-06 DIAGNOSIS — H353133 Nonexudative age-related macular degeneration, bilateral, advanced atrophic without subfoveal involvement: Secondary | ICD-10-CM | POA: Diagnosis not present

## 2015-12-07 ENCOUNTER — Telehealth: Payer: Self-pay | Admitting: *Deleted

## 2015-12-07 NOTE — Telephone Encounter (Signed)
Patient is calling to let us know that [facility] called her last Thursday to verify her insurance information (Dr. Burr Medico was going to call [facility] for 2nd opinion/study).  However, since then she has not heard anymore from them and Dr.Feng had asked her to call us back and let her know about this.        Also patient wants to know if she needs a mammogram?     Will send message to Dr. Burr Medico.       Patient call back is [phone].

## 2015-12-08 DIAGNOSIS — E041 Nontoxic single thyroid nodule: Secondary | ICD-10-CM | POA: Diagnosis not present

## 2015-12-08 DIAGNOSIS — N390 Urinary tract infection, site not specified: Secondary | ICD-10-CM | POA: Diagnosis not present

## 2015-12-08 DIAGNOSIS — M810 Age-related osteoporosis without current pathological fracture: Secondary | ICD-10-CM | POA: Diagnosis not present

## 2015-12-08 DIAGNOSIS — Z0000 Encounter for general adult medical examination without abnormal findings: Secondary | ICD-10-CM | POA: Diagnosis not present

## 2015-12-09 DIAGNOSIS — M1711 Unilateral primary osteoarthritis, right knee: Secondary | ICD-10-CM | POA: Diagnosis not present

## 2015-12-13 NOTE — Progress Notes (Signed)
- [place]  Telephone:([phone]) [phone] Fax:(336) [phone]  Clinic Follow Up Note   Patient Care Team: Prince Solian, MD as PCP - General (Internal Medicine) Gaye Pollack, MD as Consulting Physician (Cardiothoracic Surgery) Gatha Mayer, MD as Consulting Physician (Gastroenterology) Leighton Ruff, MD as Consulting Physician (General Surgery) Tania Ade, RN as Registered Nurse (Medical Oncology) 12/16/2015  CHIEF COMPLAINTS:  Follow-up of colon cancer  Oncology History   Cancer of left colon [facility])   Staging form: Colon and Rectum, AJCC 7th Edition     Pathologic stage from 05/17/2014: Stage IIA (T3, N0, cM0) - Signed by Truitt Merle, MD on 02/21/2015 Cancer of right colon [facility])   Staging form: Colon and Rectum, AJCC 7th Edition     Pathologic stage from 12/08/2014: Stage IIIB (T4a, N1a, cM0) - Signed by Truitt Merle, MD on 02/21/2015       Cancer of right colon ([place])   04/09/2014 Procedure    Colonoscopy per Dr. Silvano Rusk: Large fleshy mass sigmoid colon 25 cm from anus. Could not pass through.      04/09/2014 Tumor Marker    CEA =11.7      04/12/2014 Imaging    CT C/A/P:sigmoid colon mass;several small areas of soft tissue nodularity in peritoneal cavity; small attenuation structure posterior right hepatic lobe; 2 stable nodules in lungs dating back to 2014. No acute findings.      05/17/2014 Initial Diagnosis    Colon cancer-presented with heme + stool and more frequent BMs      05/17/2014 Surgery    Robot assisted sigmoidectomy      05/17/2014 Pathologic Stage    pT3,pN0,pMX--Grade 1--0/13 nodes +--MMR normal      05/17/2014 Clinical Stage    Stage II      12/08/2014 Surgery    right hemicolectomy       12/08/2014 Pathology Results    right colon adenocarcinoma, pT4aN1c, 19 nodes were negative, LVI(+), MMR normal       01/17/2015 - 06/27/2015 Chemotherapy    adjuvant capecitabine 1565m q12h, 2 weeks on and 1 week off. dose reduction and stopped  after 6 cycles due to poor tolerance,       08/15/2015 Progression    CT abdomen showed peritoneal nodules, highly suspicious for recurrent metastasis      10/10/2015 Imaging    PET scan showed multiple enlarging, hypermetabolic peritoneal nodules consistent with peritoneal carcinomatosis. No other evidence of metastasis       HISTORY OF PRESENTING ILLNESS:  Heidi [id number].o. female is here because of recently diagnosed a stage II sigmoid colon cancer.  She had annual physical with her PCP in early Feb this year, and stool OB (+), no overt GI bleeding, CBC was normal. She had noticed some loose BM before that, but no nausea, loss of appetite, weight loss or other symoptoms. Colonoscopy showed a large mass in the sigmoid colon 25 cm from anus. Biopsy was taken which showed tubulovillous adenoma, no malignancy. She was referred to surgeon Dr. TMarcello Mooresand underwent robotic-assisted sigmoidectomy on 4/4. She tolerated the procedure well, and was discharged home afterwards.  She has recovered well from the surgery 3 weeks ago, she has mild fatigue, but otherwise doing very well without other complains. She had 2-3 episodes of bleeding after the surgery, which was related to her herromods. She is eating well. 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   She has  chronic knee pain she takes ibuprofen once daily.    CURRENT THERAPY: Surveillance  INTERIM HISTORY: Heidi Marquez returns for follow-up. She is doing well overall. She has not consulted at [facility] - she only received a call from them to verify her insurance. She had a routine physical with her primary care physician this past Tuesday and everything was okay. She has no concerns or complaints at this time.  MEDICAL HISTORY:  Past Medical History:  Diagnosis Date  . Allergy   . Arthritis    knees  . Benign esophageal stricture 04/2007   EGD with dilatation.  . Cataract    ight eye  starting  . Colon cancer ([place])     COLON CANCER / HX SKIN CANCER  . Diverticulosis of colon    severe in sigmoid but diffusley throughout colon  on 2009 colonoscopy  . Epistaxis ~ 2005   required cauterization twice.   Marland Kitchen GERD (gastroesophageal reflux disease)   . Hematochezia 2009  . HH (hiatus hernia)   . History of skin cancer   . IBS (irritable bowel syndrome)   . Internal hemorrhoids   . Macular degeneration   . Reflux esophagitis     SURGICAL HISTORY: Past Surgical History:  Procedure Laterality Date  . ABDOMINAL HYSTERECTOMY     partial with the bladder tack  . basal cell resection  08/2011, 11/2011   initially biopsy 08/3011.  repeat excision right nose with grafting from the skin behind the right ear  . bladder tack    . COLON SURGERY  APRIL 2016   SIGMOIDECTOMY  . COLONOSCOPY    . COLONOSCOPY WITH PROPOFOL N/A 09/30/2014   Procedure: COLONOSCOPY WITH PROPOFOL;  Surgeon: Milus Banister, MD;  Location: [facility];  Service: Endoscopy;  Laterality: N/A;  . ESOPHAGOGASTRODUODENOSCOPY    . INGUINAL HERNIA REPAIR  early 1990s   right  . retinal laser surgery    . UPPER GASTROINTESTINAL ENDOSCOPY      SOCIAL HISTORY: Social History   Social History  . Marital status: Married    Spouse name: N/A  . Number of children: N/A  . Years of education: N/A   Occupational History  . Not on file.   Social History Main Topics  . Smoking status: Passive Smoke Exposure - Never Smoker  . Smokeless tobacco: Never Used  . Alcohol use No  . Drug use: No  . Sexual activity: Not on file   Other Topics Concern  . Not on file   Social History Narrative   Married, husband is disabled (physical and dementia) and she is caregiver   Has #1 child (daughter) living. Son died of MI at age 67   Worked at [employer] before she retired   Has #3 cats    FAMILY HISTORY: Family History  Problem Relation Age of Onset  . Colon cancer Paternal Grandfather 54    colon cancer    . Stroke Father   . Heart attack Son 65    died of heart attack   . Esophageal cancer Neg Hx   . Rectal cancer Neg Hx   . Stomach cancer Neg Hx     ALLERGIES:  is allergic to penicillins; sulfonamide derivatives; and amoxicillin-pot clavulanate.  MEDICATIONS:  Current Outpatient Prescriptions  Medication Sig Dispense Refill  . alendronate (FOSAMAX) 70 MG tablet Take 70 mg by mouth every 7 (seven) days. Sundays. Take with a full glass of water on an empty stomach.    . Ascorbic Acid (  VITAMIN C) 1000 MG tablet Take 1,000 mg by mouth daily.    . Biotin 1000 MCG tablet Take 1,000 mcg by mouth daily.     Marland Kitchen CALCIUM & MAGNESIUM CARBONATES PO Take 1 tablet by mouth daily.    . cholecalciferol (VITAMIN D) 1000 UNITS tablet Take 1,000 Units by mouth daily.    . diphenoxylate-atropine (LOMOTIL) 2.5-0.025 MG tablet Take 1-2 tablets by mouth 4 (four) times daily as needed for diarrhea or loose stools. 30 tablet 2  . glucosamine-chondroitin 500-400 MG tablet Take 1 tablet by mouth daily.     . Homeopathic Products ([employer] LEG CRAMPS PAIN RELIEF PO) Take 1 tablet by mouth daily as needed (for leg cramps).    Marland Kitchen HYDROcodone-acetaminophen (NORCO/VICODIN) 5-325 MG tablet Take 1-2 tablets by mouth every 6 (six) hours as needed. 30 tablet 0  . hydrocortisone 1 % lotion Apply 1 application topically 2 (two) times daily. Apply to hands 118 mL 0  . ibuprofen (ADVIL,MOTRIN) 200 MG tablet Take 200 mg by mouth daily as needed for moderate pain.     Marland Kitchen loratadine (CLARITIN) 10 MG tablet Take 10 mg by mouth daily.    . Multiple Vitamin (MULTIVITAMIN) tablet Take 1 tablet by mouth daily.    . potassium chloride SA (K-DUR,KLOR-CON) 20 MEQ tablet Take 1 tablet (20 mEq total) by mouth daily. 20 tablet 1  . urea (CARMOL) 10 % cream Apply topically 2 (two) times daily. Apply to hand twice daily 142 g 0  . vitamin B-12 (CYANOCOBALAMIN) 1000 MCG tablet Take 1,000 mcg by mouth daily.      No current facility-administered  medications for this visit.     REVIEW OF SYSTEMS:   Constitutional: Denies fevers, chills or abnormal night sweats Eyes: Denies blurriness of vision, double vision or watery eyes Ears, nose, mouth, throat, and face: Denies mucositis or sore throat Respiratory: Denies cough, dyspnea or wheezes Cardiovascular: Denies palpitation, chest discomfort or lower extremity swelling Gastrointestinal:  Denies nausea, heartburn or change in bowel habits Skin: Denies abnormal skin rashes Lymphatics: Denies new lymphadenopathy or easy bruising Neurological:Denies numbness, tingling or new weaknesses Behavioral/Psych: Mood is stable, no new changes  All other systems were reviewed with the patient and are negative.   PHYSICAL EXAMINATION: ECOG PERFORMANCE STATUS: 1  Vitals:   12/16/15 1056 12/16/15 1118  BP: (!) 204/97 (!) 174/87  Pulse:  61  Resp:    Temp:     Filed Weights   12/16/15 1055  Weight: 141 lb 4.8 oz (64.1 kg)    GENERAL:alert, no distress and comfortable SKIN: skin color, texture, turgor are normal, no rashes or significant lesions  EYES: normal, conjunctiva are pink and non-injected, sclera clear OROPHARYNX:no exudate, no erythema and lips, buccal mucosa, and tongue normal  NECK: supple, thyroid normal size, non-tender, without nodularity LYMPH:  no palpable lymphadenopathy in the cervical, axillary or inguinal LUNGS: clear to auscultation and percussion with normal breathing effort HEART: regular rate & rhythm and no murmurs and no lower extremity edema ABDOMEN:abdomen soft, surgical incision and to the low abdomen is well healed. non-tender and normal bowel sounds Musculoskeletal:no cyanosis of digits and no clubbing  PSYCH: alert & oriented x 3 with fluent speech NEURO: no focal motor/sensory deficits   LABORATORY DATA:  I have reviewed the data as listed CBC Latest Ref Rng & Units 12/16/2015 11/04/2015 08/15/2015  WBC 3.9 - 10.3 10e3/uL 5.9 6.0 6.1  Hemoglobin 11.6 -  15.9 g/dL 12.8 13.2 12.9  Hematocrit 34.8 -  46.6 % 38.6 40.0 39.2  Platelets 145 - 400 10e3/uL 148 144(L) 266   CMP Latest Ref Rng & Units 12/16/2015 08/15/2015 06/27/2015  Glucose 70 - 140 mg/dl 83 76 104  BUN 7.0 - 26.0 mg/dL 16.2 16.9 17.9  Creatinine 0.6 - 1.1 mg/dL 0.8 1.0 0.9  Sodium 136 - 145 mEq/L 142 143 145  Potassium 3.5 - 5.1 mEq/L 4.4 4.3 4.4  Chloride 101 - 111 mmol/L - - -  CO2 22 - 29 mEq/L 29 29 32(H)  Calcium 8.4 - 10.4 mg/dL 9.5 10.0 9.5  Total Protein 6.4 - 8.3 g/dL 7.0 7.7 6.5  Total Bilirubin 0.20 - 1.20 mg/dL 0.59 0.54 1.01  Alkaline Phos 40 - 150 U/L 123 129 90  AST 5 - 34 U/L _0 ALT 0 - 55 U/L _1 Results for MICHELA, HERST (MRN [id number])   Ref. Range 02/03/2015 08:32 05/02/2015 12:41 08/15/2015 13:37  CEA Latest Units: ng/mL 1.4 1.4 5.8 (H)  CEA Latest Ref Range: 0.0 - 4.7 ng/mL  2.6 9.6 (H)        Pathology report: Diagnosis 05/17/2014 1. Colon, segmental resection for tumor, sigmoid, open end proximal - INVASIVE ADENOCARCINOMA WITH EXTRACELLULAR MUCIN, INVADING THROUGH THE MUSCULARIS PROPRIA INTO PERICOLONIC FATTY TISSUE. - THIRTEEN LYMPH NODES, NEGATIVE FOR METASTATIC CARCINOMA (0/13). - MULTIPLE DIVERTICULA. - RESECTION MARGINS, NEGATIVE FOR ATYPIA OR MALIGNANCY. - PLEASE SEE ONCOLOGY TEMPLATE FOR DETAILS. 2. Colon, resection margin (donut), sigmoid - BENIGN COLONIC MUCOSA, NO EVIDENCE OF DYSPLASIA OR MALIGNANCY. Specimen: Left colon. Procedure: Segmental resection. Tumor site: Sigmoid colon. Specimen integrity: Intact. Macroscopic intactness of mesorectum: N/A Macroscopic tumor perforation: No Invasive tumor: Invasive adenocarcinoma with extracellular mucin. Maximum size: 8.0 cm, gross measurement. Histologic type(s): Invasive adenocarcinoma with extracellular mucin. Histologic grade and differentiation: G1: well differentiated/low grade Type of polyp in which invasive carcinoma arose: Villous adenoma. Microscopic extension of  invasive tumor: Invading through the muscularis propria into pericolonic fatty tissue. Lymph-Vascular invasion: Not identified. Peri-neural invasion: Not identified. Tumor deposit(s) (discontinuous extramural extension): None. Resection margins: Negative. Proximal margin: 9.6 cm. Distal margin: 9.6 cm. Mesenteric margin (sigmoid and transverse): 6 cm. Treatment effect (neo-adjuvant therapy): No Additional polyp(s): N/A Non-neoplastic findings: Prominent Crohn-like reaction around the tumor. Lymph nodes: number examined 13; number positive: 0 Pathologic Staging: pT3, pN0, pMX  Diagnosis 12/08/2014  Colon, segmental resection for tumor, right colon MODERATELY DIFFERENTIATED ADENOCARCINOMA OF THE COLON (3.8 CM) THE TUMOR PENETRATES TO THE SURFACE OF THE VISCERAL PERITONEUM (T4A) LYMPHOVASCULAR INVASION IS IDENTIFIED MARGINS OF RESECTION ARE NEGATIVE FOR TUMOR NINTEEN BENIGN LYMPH NODE (0/19) ONE TUMOR DEPOSITE IS IDENTIFIED ([place]) Specimen: Colon Procedure: Segmental resection Tumor site: Ascending colon Specimen integrity: Intact Macroscopic intactness of mesorectum: Not applicable: Macroscopic tumor perforation: The tumor invades through the muscularis propria penatrate the surface of serosa Invasive tumor: Maximum size: 3.8 Histologic type(s): Adenocarcinoma G2 G2: moderately differentiated/low grade Type of polyp in which invasive carcinoma arose: Tubular adenoma Microscopic extension of invasive tumor: Serosa Lymph-Vascular invasion: Present Peri-neural invasion: Negative Tumor deposit(s) (discontinuous extramural extension): Present Resection margins: Proximal margin: 12 cm Distal margin: 23 cm Circumferential (radial) (posterior ascending, posterior descending; lateral and posterior mid-rectum; and entire lower 1/3 rectum):3.5 cm Mesenteric margin (sigmoid and transverse): NA Distance closest margin (if all above margins negative): _ Treatment effect (neo-adjuvant  therapy): Negative Additional polyp(s): Negative Non-neoplastic findings: Unremarkabel Lymph nodes: number examined 19; number positive: 0 Pathologic Staging: T4a, N1c, M_  MMR: NORMAL MSI: STABLE  Mismatch  Repair (MMR) Protein Immunohistochemistry (IHC) IHC Expression Result: MLH1: Preserved nuclear expression (greater 50% tumor expression) MSH2: Preserved nuclear expression (greater 50% tumor expression) MSH6: Preserved nuclear expression (greater 50% tumor expression) PMS2: Preserved nuclear expression (greater 50% tumor expression) * Internal control demonstrates intact nuclear expression Interpretation: NORMAL   RADIOGRAPHIC STUDIES: I have personally reviewed the radiological images as listed and agreed with the findings in the report.  PET 10/10/2015 IMPRESSION: 1. Multiple enlarging, hypermetabolic peritoneal nodules consistent with peritoneal carcinomatosis. No generalized ascites. 2. No evidence of metastatic disease to the liver. 3. No suspicious pulmonary findings. Grossly stable chronic postinflammatory changes. 4. Multinodular goiter.  Diagnosis 11/04/2015 Peritoneum, biopsy, RLQ - METASTATIC COLORECTAL ADENOCARCINOMA. - SEE COMMENT. Microscopic Comment Dr. Burr Medico was paged on 11/07/2015. Per request, a block will be sent to [facility] for additional testing and the results reported separately. (JBK:kh 11/07/2015)  ASSESSMENT & PLAN:  82 year old Caucasian female, with past medical history of colon diverticulosis and arthritis, but otherwise healthy and active, who was found to have a large sigmoid tumor, status post sigmoidectomy.  1. Sigmoid colon adenocarcinoma, pT3N0M0, stage II, G1, MSI stable, ascending colon adenocarcinoma, pT4aN1cM0, stage IIIB, MSI stable, G2, recurrent peritoneal carcinomatosis in 08/2015 -She had multifocal (2) colon cancer status post complete surgical resection,  -I reviewed her recent surgical pathology results in great details  with her. The right colon cancer has several high-risk features, including stage IIIB, T4a lesion, tumor deposits, lymphovascular invasion, her risk of colon cancer recurrence is high.  -She tried adjuvant chemotherapy Xeloda, but could not tolerate full dose, and last cycle chemotherapy was canceled due to her tolerance issue. -I reviewed her restaging CT scan from 08/2015, which showed a 2 new peritoneal nodule, suspicious for peritoneal carcinomatosis. She is not symptomatic now. -Her to follow-up her CEA has also increased lately, concerning for cancer recurrence. -I reviewed her recent PET scan from 10/10/2015, which unfortunately showed multiple enlarging, hypermetabolic peritoneal nodules consistent with peritoneal carcinomatosis. No ascites or other metastasis. -I discussed her recent peritoneal mass biopsy, which confirmed metastatic colon cancer  -We discussed that her cancer is not curable at this stage, she is not a candidate for debulking and HIPEC, and her goal of care is palliation.  -Her [facility] genomic testing results were discussed with the patient. She has KRAS+   -Her tumor has MSI stable, not a good candidate for immunotherapy alone  -If her tumor has KRAS or NRAS mutation, not a candidate for EGFR inhibitor.  -I recommend her to consider 5-FU and Avastin. She is KRAS positive. I spoke with her about this chemo regimen. She may do better with IV treatment, she experienced severe fatigue with previous xeloda.  -She would like to have a second opinion at [facility], [place] set up her referral. She has not yet consulted at [facility] and would like another referral.  2. Peripheral neuropathy, G 1 - noticed from second cycle of Xeloda, improved some since she came off chemotherapy.  3. GERD -she'll continue Protonix.  4.Lung nodules  -She has two small right lung nodules which were discovered on the CT scan in 09/2012,  RUL nodule slightly bigger on recent CT -She has been follow-up with  Dr. Cyndia Bent.  -she never smoked, low risk for lung cancer -given the stability of the RUL nodule over 2 years, this is likely a benign lesion    [place] [facility] results discussed. She is KRAS mutation positive. Chemotherapy plan discussed.  -Refer her to [facility] Dr.  Strickler again  -She will tentatively begin treatment after thanksgiving and meeting with [facility] for a second opinion. She will contact our office if she decides to start treatment sooner. -I'll see her again on 11/28   All questions were answered. The patient knows to call the clinic with any problems, questions or concerns.  I spent 20 minutes counseling the patient face to face. The total time spent in the appointment was 25  minutes and more than 50% was on counseling.   This document serves as a record of services personally performed by Truitt Merle, MD. It was created on her behalf by Arlyce Harman, a trained medical scribe. The creation of this record is based on the scribe's personal observations and the provider's statements to them. This document has been checked and approved by the attending provider.  Truitt Merle  12/16/2015

## 2015-12-14 DIAGNOSIS — R10817 Generalized abdominal tenderness: Secondary | ICD-10-CM | POA: Diagnosis not present

## 2015-12-14 DIAGNOSIS — M810 Age-related osteoporosis without current pathological fracture: Secondary | ICD-10-CM | POA: Diagnosis not present

## 2015-12-14 DIAGNOSIS — C189 Malignant neoplasm of colon, unspecified: Secondary | ICD-10-CM | POA: Diagnosis not present

## 2015-12-14 DIAGNOSIS — F419 Anxiety disorder, unspecified: Secondary | ICD-10-CM | POA: Diagnosis not present

## 2015-12-14 DIAGNOSIS — R918 Other nonspecific abnormal finding of lung field: Secondary | ICD-10-CM | POA: Diagnosis not present

## 2015-12-14 DIAGNOSIS — Z6825 Body mass index (BMI) 25.0-25.9, adult: Secondary | ICD-10-CM | POA: Diagnosis not present

## 2015-12-14 DIAGNOSIS — K219 Gastro-esophageal reflux disease without esophagitis: Secondary | ICD-10-CM | POA: Diagnosis not present

## 2015-12-14 DIAGNOSIS — M5380 Other specified dorsopathies, site unspecified: Secondary | ICD-10-CM | POA: Diagnosis not present

## 2015-12-14 DIAGNOSIS — N390 Urinary tract infection, site not specified: Secondary | ICD-10-CM | POA: Diagnosis not present

## 2015-12-16 ENCOUNTER — Encounter: Payer: Self-pay | Admitting: Hematology

## 2015-12-16 ENCOUNTER — Ambulatory Visit (HOSPITAL_BASED_OUTPATIENT_CLINIC_OR_DEPARTMENT_OTHER): Payer: PPO | Admitting: Hematology

## 2015-12-16 ENCOUNTER — Telehealth: Payer: Self-pay | Admitting: Hematology

## 2015-12-16 ENCOUNTER — Other Ambulatory Visit (HOSPITAL_BASED_OUTPATIENT_CLINIC_OR_DEPARTMENT_OTHER): Payer: PPO

## 2015-12-16 VITALS — BP 174/87 | HR 61 | Temp 98.3°F | Resp 17 | Ht 63.5 in | Wt 141.3 lb

## 2015-12-16 DIAGNOSIS — C187 Malignant neoplasm of sigmoid colon: Secondary | ICD-10-CM

## 2015-12-16 DIAGNOSIS — C182 Malignant neoplasm of ascending colon: Secondary | ICD-10-CM | POA: Diagnosis not present

## 2015-12-16 DIAGNOSIS — C786 Secondary malignant neoplasm of retroperitoneum and peritoneum: Secondary | ICD-10-CM

## 2015-12-16 DIAGNOSIS — M1711 Unilateral primary osteoarthritis, right knee: Secondary | ICD-10-CM | POA: Diagnosis not present

## 2015-12-16 DIAGNOSIS — K219 Gastro-esophageal reflux disease without esophagitis: Secondary | ICD-10-CM

## 2015-12-16 DIAGNOSIS — G620 Drug-induced polyneuropathy: Secondary | ICD-10-CM | POA: Diagnosis not present

## 2015-12-16 DIAGNOSIS — C186 Malignant neoplasm of descending colon: Secondary | ICD-10-CM

## 2015-12-16 DIAGNOSIS — R911 Solitary pulmonary nodule: Secondary | ICD-10-CM

## 2015-12-16 LAB — COMPREHENSIVE METABOLIC PANEL
ALBUMIN: 3.8 g/dL (ref 3.5–5.0)
ALK PHOS: 123 U/L (ref 40–150)
ALT: 19 U/L (ref 0–55)
AST: 29 U/L (ref 5–34)
Anion Gap: 7 mEq/L (ref 3–11)
BUN: 16.2 mg/dL (ref 7.0–26.0)
CO2: 29 mEq/L (ref 22–29)
Calcium: 9.5 mg/dL (ref 8.4–10.4)
Chloride: 106 mEq/L (ref 98–109)
Creatinine: 0.8 mg/dL (ref 0.6–1.1)
EGFR: 67 mL/min/{1.73_m2} — AB (ref >90)
GLUCOSE: 83 mg/dL (ref 70–140)
POTASSIUM: 4.4 meq/L (ref 3.5–5.1)
SODIUM: 142 meq/L (ref 136–145)
Total Bilirubin: 0.59 mg/dL (ref 0.20–1.20)
Total Protein: 7 g/dL (ref 6.4–8.3)

## 2015-12-16 LAB — CBC WITH DIFFERENTIAL/PLATELET
BASO%: 1 % (ref 0.0–2.0)
BASOS ABS: 0.1 10*3/uL (ref 0.0–0.1)
EOS ABS: 0.2 10*3/uL (ref 0.0–0.5)
EOS%: 3.5 % (ref 0.0–7.0)
HCT: 38.6 % (ref 34.8–46.6)
HEMOGLOBIN: 12.8 g/dL (ref 11.6–15.9)
LYMPH%: 13.3 % — AB (ref 14.0–49.7)
MCH: 30.1 pg (ref 25.1–34.0)
MCHC: 33.1 g/dL (ref 31.5–36.0)
MCV: 91.1 fL (ref 79.5–101.0)
MONO#: 0.4 10*3/uL (ref 0.1–0.9)
MONO%: 7.5 % (ref 0.0–14.0)
NEUT#: 4.4 10*3/uL (ref 1.5–6.5)
NEUT%: 74.7 % (ref 38.4–76.8)
Platelets: 148 10*3/uL (ref 145–400)
RBC: 4.24 10*6/uL (ref 3.70–5.45)
RDW: 13.1 % (ref 11.2–14.5)
WBC: 5.9 10*3/uL (ref 3.9–10.3)
lymph#: 0.8 10*3/uL — ABNORMAL LOW (ref 0.9–3.3)

## 2015-12-16 LAB — CEA (IN HOUSE-CHCC): CEA (CHCC-In House): 252.84 ng/mL — ABNORMAL HIGH (ref 0.00–5.00)

## 2015-12-16 NOTE — Telephone Encounter (Signed)
Appointments scheduled per 12/16/15 los. AVS report and appointment schedule given to patient per 12/16/15 los.

## 2015-12-17 LAB — CEA: CEA1: 189.8 ng/mL — AB (ref 0.0–4.7)

## 2015-12-19 ENCOUNTER — Telehealth: Payer: Self-pay | Admitting: Hematology

## 2015-12-19 NOTE — Telephone Encounter (Signed)
Pt appt with Dr. Reynaldo Minium is 01/04/16 @2 :00. Pt is aware. Path reports will be faxed.

## 2015-12-21 DIAGNOSIS — C187 Malignant neoplasm of sigmoid colon: Secondary | ICD-10-CM | POA: Diagnosis not present

## 2015-12-21 DIAGNOSIS — C189 Malignant neoplasm of colon, unspecified: Secondary | ICD-10-CM | POA: Diagnosis not present

## 2015-12-23 DIAGNOSIS — M1711 Unilateral primary osteoarthritis, right knee: Secondary | ICD-10-CM | POA: Diagnosis not present

## 2015-12-28 DIAGNOSIS — C188 Malignant neoplasm of overlapping sites of colon: Secondary | ICD-10-CM | POA: Diagnosis not present

## 2016-01-12 ENCOUNTER — Other Ambulatory Visit: Payer: Self-pay | Admitting: *Deleted

## 2016-01-12 ENCOUNTER — Other Ambulatory Visit (HOSPITAL_BASED_OUTPATIENT_CLINIC_OR_DEPARTMENT_OTHER): Payer: PPO

## 2016-01-12 ENCOUNTER — Ambulatory Visit (HOSPITAL_BASED_OUTPATIENT_CLINIC_OR_DEPARTMENT_OTHER): Payer: PPO | Admitting: Hematology

## 2016-01-12 ENCOUNTER — Telehealth: Payer: Self-pay | Admitting: Hematology

## 2016-01-12 ENCOUNTER — Encounter: Payer: Self-pay | Admitting: Hematology

## 2016-01-12 VITALS — BP 165/79 | HR 80 | Temp 98.0°F | Resp 17 | Ht 63.5 in | Wt 140.2 lb

## 2016-01-12 DIAGNOSIS — R911 Solitary pulmonary nodule: Secondary | ICD-10-CM

## 2016-01-12 DIAGNOSIS — C186 Malignant neoplasm of descending colon: Secondary | ICD-10-CM

## 2016-01-12 DIAGNOSIS — C182 Malignant neoplasm of ascending colon: Secondary | ICD-10-CM

## 2016-01-12 DIAGNOSIS — C187 Malignant neoplasm of sigmoid colon: Secondary | ICD-10-CM

## 2016-01-12 DIAGNOSIS — G620 Drug-induced polyneuropathy: Secondary | ICD-10-CM | POA: Diagnosis not present

## 2016-01-12 DIAGNOSIS — C786 Secondary malignant neoplasm of retroperitoneum and peritoneum: Secondary | ICD-10-CM

## 2016-01-12 DIAGNOSIS — K219 Gastro-esophageal reflux disease without esophagitis: Secondary | ICD-10-CM

## 2016-01-12 LAB — COMPREHENSIVE METABOLIC PANEL
ALBUMIN: 4.1 g/dL (ref 3.5–5.0)
ALT: 11 U/L (ref 0–55)
AST: 23 U/L (ref 5–34)
Alkaline Phosphatase: 112 U/L (ref 40–150)
Anion Gap: 9 mEq/L (ref 3–11)
BUN: 21.6 mg/dL (ref 7.0–26.0)
CHLORIDE: 104 meq/L (ref 98–109)
CO2: 29 mEq/L (ref 22–29)
Calcium: 10.2 mg/dL (ref 8.4–10.4)
Creatinine: 0.9 mg/dL (ref 0.6–1.1)
EGFR: 59 mL/min/{1.73_m2} — ABNORMAL LOW (ref >90)
GLUCOSE: 124 mg/dL (ref 70–140)
POTASSIUM: 4.3 meq/L (ref 3.5–5.1)
SODIUM: 142 meq/L (ref 136–145)
Total Bilirubin: 0.9 mg/dL (ref 0.20–1.20)
Total Protein: 7.4 g/dL (ref 6.4–8.3)

## 2016-01-12 LAB — CBC WITH DIFFERENTIAL/PLATELET
BASO%: 0.7 % (ref 0.0–2.0)
BASOS ABS: 0.1 10*3/uL (ref 0.0–0.1)
EOS%: 2.5 % (ref 0.0–7.0)
Eosinophils Absolute: 0.2 10*3/uL (ref 0.0–0.5)
HCT: 41.3 % (ref 34.8–46.6)
HEMOGLOBIN: 13.4 g/dL (ref 11.6–15.9)
LYMPH%: 14.2 % (ref 14.0–49.7)
MCH: 29.9 pg (ref 25.1–34.0)
MCHC: 32.5 g/dL (ref 31.5–36.0)
MCV: 92 fL (ref 79.5–101.0)
MONO#: 0.5 10*3/uL (ref 0.1–0.9)
MONO%: 6.8 % (ref 0.0–14.0)
NEUT#: 5.4 10*3/uL (ref 1.5–6.5)
NEUT%: 75.8 % (ref 38.4–76.8)
Platelets: 159 10*3/uL (ref 145–400)
RBC: 4.49 10*6/uL (ref 3.70–5.45)
RDW: 13.5 % (ref 11.2–14.5)
WBC: 7.1 10*3/uL (ref 3.9–10.3)
lymph#: 1 10*3/uL (ref 0.9–3.3)

## 2016-01-12 MED ORDER — PROCHLORPERAZINE MALEATE 10 MG PO TABS: 10.0000 mg | ORAL_TABLET | Freq: Four times a day (QID) | ORAL | 1 refills | Status: DC | PRN

## 2016-01-12 MED ORDER — PROCHLORPERAZINE MALEATE 10 MG PO TABS: 10.0000 mg | ORAL_TABLET | Freq: Three times a day (TID) | ORAL | 0 refills | Status: DC | PRN

## 2016-01-12 MED ORDER — LIDOCAINE-PRILOCAINE 2.5-2.5 % EX CREA: TOPICAL_CREAM | CUTANEOUS | 3 refills | Status: DC

## 2016-01-12 MED ORDER — ONDANSETRON HCL 8 MG PO TABS: 8.0000 mg | ORAL_TABLET | Freq: Two times a day (BID) | ORAL | 1 refills | Status: DC | PRN

## 2016-01-12 MED ORDER — LIDOCAINE-PRILOCAINE 2.5-2.5 % EX CREA: TOPICAL_CREAM | CUTANEOUS | 3 refills | Status: AC

## 2016-01-12 MED ORDER — ONDANSETRON HCL 8 MG PO TABS: 8.0000 mg | ORAL_TABLET | Freq: Three times a day (TID) | ORAL | 1 refills | Status: AC | PRN

## 2016-01-12 NOTE — Telephone Encounter (Signed)
Appointments scheduled per 11/30 LOS. Patient given AVS report and calendars with future scheduled appointments. °

## 2016-01-12 NOTE — Progress Notes (Signed)
- [place]  [phone] [phone]  Clinic Follow Up Note   Patient Care Team: Prince Solian, MD as PCP - General (Internal Medicine) Gaye Pollack, MD as Consulting Physician (Cardiothoracic Surgery) Gatha Mayer, MD as Consulting Physician (Gastroenterology) Leighton Ruff, MD as Consulting Physician (General Surgery) Tania Ade, RN as Registered Nurse (Medical Oncology) 01/12/2016  CHIEF COMPLAINTS:  Follow-up of colon cancer  Oncology History   Cancer of left colon [facility])   Staging form: Colon and Rectum, AJCC 7th Edition     Pathologic stage from 05/17/2014: Stage IIA (T3, N0, cM0) - Signed by Truitt Merle, MD on 02/21/2015 Cancer of right colon [facility])   Staging form: Colon and Rectum, AJCC 7th Edition     Pathologic stage from 12/08/2014: Stage IIIB (T4a, N1a, cM0) - Signed by Truitt Merle, MD on 02/21/2015       Cancer of right colon ([place])   04/09/2014 Procedure    Colonoscopy per Dr. Silvano Rusk: Large fleshy mass sigmoid colon 25 cm from anus. Could not pass through.      04/09/2014 Tumor Marker    CEA =11.7      04/12/2014 Imaging    CT C/A/P:sigmoid colon mass;several small areas of soft tissue nodularity in peritoneal cavity; small attenuation structure posterior right hepatic lobe; 2 stable nodules in lungs dating back to 2014. No acute findings.      05/17/2014 Initial Diagnosis    Colon cancer-presented with heme + stool and more frequent BMs      05/17/2014 Surgery    Robot assisted sigmoidectomy      05/17/2014 Pathologic Stage    pT3,pN0,pMX--Grade 1--0/13 nodes +--MMR normal      05/17/2014 Clinical Stage    Stage II      12/08/2014 Surgery    right hemicolectomy       12/08/2014 Pathology Results    right colon adenocarcinoma, pT4aN1c, 19 nodes were negative, LVI(+), MMR normal       01/17/2015 - 06/27/2015 Chemotherapy    adjuvant capecitabine 1532m q12h, 2 weeks on and 1 week off. dose reduction and stopped  after 6 cycles due to poor tolerance,       08/15/2015 Progression    CT abdomen showed peritoneal nodules, highly suspicious for recurrent metastasis      10/10/2015 Imaging    PET scan showed multiple enlarging, hypermetabolic peritoneal nodules consistent with peritoneal carcinomatosis. No other evidence of metastasis       HISTORY OF PRESENTING ILLNESS:  [employer]INDU DOCTER82y.o. female is here because of recently diagnosed a stage II sigmoid colon cancer.  She had annual physical with her PCP in early Feb this year, and stool OB (+), no overt GI bleeding, CBC was normal. She had noticed some loose BM before that, but no nausea, loss of appetite, weight loss or other symoptoms. Colonoscopy showed a large mass in the sigmoid colon 25 cm from anus. Biopsy was taken which showed tubulovillous adenoma, no malignancy. She was referred to surgeon Dr. TMarcello Mooresand underwent robotic-assisted sigmoidectomy on 4/4. She tolerated the procedure well, and was discharged home afterwards.  She has recovered well from the surgery 3 weeks ago, she has mild fatigue, but otherwise doing very well without other complains. She had 2-3 episodes of bleeding after the surgery, which was related to her herromods. She is eating well. 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   She has  chronic knee pain she takes ibuprofen once daily.    CURRENT THERAPY: pending chemotherapy with 5-Fu and Avastin   INTERIM HISTORY: Mrs. Flis returns for follow-up. She went [facility] and saw Dr. Reynaldo Minium who recommended her to start chemotherapy with 5-FU and Avastin. She is doing well overall, has occasional abdominal pain in the left low quadrant of abdomen, likely cramps, resolves on its own. No other pain, bloating, nausea, or other symptoms. Her appetite and energy has remained to be well. Her husband was recently hospitalized for CHF, she has been quite stressed due to that. No other  new complaints.  MEDICAL HISTORY:  Past Medical History:  Diagnosis Date  . Allergy   . Arthritis    knees  . Benign esophageal stricture 04/2007   EGD with dilatation.  . Cataract    ight eye starting  . Colon cancer ([place])     COLON CANCER / HX SKIN CANCER  . Diverticulosis of colon    severe in sigmoid but diffusley throughout colon  on 2009 colonoscopy  . Epistaxis ~ 2005   required cauterization twice.   Marland Kitchen GERD (gastroesophageal reflux disease)   . Hematochezia 2009  . HH (hiatus hernia)   . History of skin cancer   . IBS (irritable bowel syndrome)   . Internal hemorrhoids   . Macular degeneration   . Reflux esophagitis     SURGICAL HISTORY: Past Surgical History:  Procedure Laterality Date  . ABDOMINAL HYSTERECTOMY     partial with the bladder tack  . basal cell resection  08/2011, 11/2011   initially biopsy 08/3011.  repeat excision right nose with grafting from the skin behind the right ear  . bladder tack    . COLON SURGERY  APRIL 2016   SIGMOIDECTOMY  . COLONOSCOPY    . COLONOSCOPY WITH PROPOFOL N/A 09/30/2014   Procedure: COLONOSCOPY WITH PROPOFOL;  Surgeon: Milus Banister, MD;  Location: [facility];  Service: Endoscopy;  Laterality: N/A;  . ESOPHAGOGASTRODUODENOSCOPY    . INGUINAL HERNIA REPAIR  early 1990s   right  . retinal laser surgery    . UPPER GASTROINTESTINAL ENDOSCOPY      SOCIAL HISTORY: Social History   Social History  . Marital status: Married    Spouse name: N/A  . Number of children: N/A  . Years of education: N/A   Occupational History  . Not on file.   Social History Main Topics  . Smoking status: Passive Smoke Exposure - Never Smoker  . Smokeless tobacco: Never Used  . Alcohol use No  . Drug use: No  . Sexual activity: Not on file   Other Topics Concern  . Not on file   Social History Narrative   Married, husband is disabled (physical and dementia) and she is caregiver   Has #1 child (daughter) living. Son died of MI  at age 20   Worked at [employer] before she retired   Has #3 cats    FAMILY HISTORY: Family History  Problem Relation Age of Onset  . Colon cancer Paternal Grandfather 57    colon cancer   . Stroke Father   . Heart attack Son 48    died of heart attack   . Esophageal cancer Neg Hx   . Rectal cancer Neg Hx   . Stomach cancer Neg Hx     ALLERGIES:  is allergic to penicillins; sulfonamide derivatives; and amoxicillin-pot clavulanate.  MEDICATIONS:  Current Outpatient Prescriptions  Medication Sig Dispense Refill  .  alendronate (FOSAMAX) 70 MG tablet Take 70 mg by mouth every 7 (seven) days. Sundays. Take with a full glass of water on an empty stomach.    . Ascorbic Acid (VITAMIN C) 1000 MG tablet Take 1,000 mg by mouth daily.    . Biotin 1000 MCG tablet Take 1,000 mcg by mouth daily.     Marland Kitchen CALCIUM & MAGNESIUM CARBONATES PO Take 1 tablet by mouth daily.    . cholecalciferol (VITAMIN D) 1000 UNITS tablet Take 1,000 Units by mouth daily.    . diphenoxylate-atropine (LOMOTIL) 2.5-0.025 MG tablet Take 1-2 tablets by mouth 4 (four) times daily as needed for diarrhea or loose stools. 30 tablet 2  . glucosamine-chondroitin 500-400 MG tablet Take 1 tablet by mouth daily.     . Homeopathic Products ([employer] LEG CRAMPS PAIN RELIEF PO) Take 1 tablet by mouth daily as needed (for leg cramps).    Marland Kitchen HYDROcodone-acetaminophen (NORCO/VICODIN) 5-325 MG tablet Take 1-2 tablets by mouth every 6 (six) hours as needed. 30 tablet 0  . hydrocortisone 1 % lotion Apply 1 application topically 2 (two) times daily. Apply to hands 118 mL 0  . ibuprofen (ADVIL,MOTRIN) 200 MG tablet Take 200 mg by mouth daily as needed for moderate pain.     Marland Kitchen lidocaine-prilocaine (EMLA) cream Apply to affected area once 30 g 3  . loratadine (CLARITIN) 10 MG tablet Take 10 mg by mouth daily.    . Multiple Vitamin (MULTIVITAMIN) tablet Take 1 tablet by mouth daily.    . ondansetron (ZOFRAN) 8 MG tablet Take 1 tablet (8 mg  total) by mouth 2 (two) times daily as needed (Nausea or vomiting). 30 tablet 1  . potassium chloride SA (K-DUR,KLOR-CON) 20 MEQ tablet Take 1 tablet (20 mEq total) by mouth daily. 20 tablet 1  . prochlorperazine (COMPAZINE) 10 MG tablet Take 1 tablet (10 mg total) by mouth every 6 (six) hours as needed (Nausea or vomiting). 30 tablet 1  . urea (CARMOL) 10 % cream Apply topically 2 (two) times daily. Apply to hand twice daily 142 g 0  . vitamin B-12 (CYANOCOBALAMIN) 1000 MCG tablet Take 1,000 mcg by mouth daily.      No current facility-administered medications for this visit.     REVIEW OF SYSTEMS:   Constitutional: Denies fevers, chills or abnormal night sweats Eyes: Denies blurriness of vision, double vision or watery eyes Ears, nose, mouth, throat, and face: Denies mucositis or sore throat Respiratory: Denies cough, dyspnea or wheezes Cardiovascular: Denies palpitation, chest discomfort or lower extremity swelling Gastrointestinal:  Denies nausea, heartburn or change in bowel habits Skin: Denies abnormal skin rashes Lymphatics: Denies new lymphadenopathy or easy bruising Neurological:Denies numbness, tingling or new weaknesses Behavioral/Psych: Mood is stable, no new changes  All other systems were reviewed with the patient and are negative.   PHYSICAL EXAMINATION: ECOG PERFORMANCE STATUS: 1  Vitals:   01/12/16 1414  BP: (!) 165/79  Pulse: 80  Resp: 17  Temp: 98 F (36.7 C)   Filed Weights   01/12/16 1414  Weight: 140 lb 3.2 oz (63.6 kg)    GENERAL:alert, no distress and comfortable SKIN: skin color, texture, turgor are normal, no rashes or significant lesions  EYES: normal, conjunctiva are pink and non-injected, sclera clear OROPHARYNX:no exudate, no erythema and lips, buccal mucosa, and tongue normal  NECK: supple, thyroid normal size, non-tender, without nodularity LYMPH:  no palpable lymphadenopathy in the cervical, axillary or inguinal LUNGS: clear to auscultation  and percussion with normal breathing effort  HEART: regular rate & rhythm and no murmurs and no lower extremity edema ABDOMEN:abdomen soft, surgical incision and to the low abdomen is well healed. non-tender and normal bowel sounds Musculoskeletal:no cyanosis of digits and no clubbing  PSYCH: alert & oriented x 3 with fluent speech NEURO: no focal motor/sensory deficits   LABORATORY DATA:  I have reviewed the data as listed CBC Latest Ref Rng & Units 01/12/2016 12/16/2015 11/04/2015  WBC 3.9 - 10.3 10e3/uL 7.1 5.9 6.0  Hemoglobin 11.6 - 15.9 g/dL 13.4 12.8 13.2  Hematocrit 34.8 - 46.6 % 41.3 38.6 40.0  Platelets 145 - 400 10e3/uL 159 148 144(L)   CMP Latest Ref Rng & Units 01/12/2016 12/16/2015 08/15/2015  Glucose 70 - 140 mg/dl 124 83 76  BUN 7.0 - 26.0 mg/dL 21.6 16.2 16.9  Creatinine 0.6 - 1.1 mg/dL 0.9 0.8 1.0  Sodium 136 - 145 mEq/L 142 142 143  Potassium 3.5 - 5.1 mEq/L 4.3 4.4 4.3  Chloride 101 - 111 mmol/L - - -  CO2 22 - 29 mEq/L '29 29 29  ' Calcium 8.4 - 10.4 mg/dL 10.2 9.5 10.0  Total Protein 6.4 - 8.3 g/dL 7.4 7.0 7.7  Total Bilirubin 0.20 - 1.20 mg/dL 0.90 0.59 0.54  Alkaline Phos 40 - 150 U/L 112 123 129  AST 5 - 34 U/L '23 29 21  ' ALT 0 - 55 U/L '11 19 13   ' Results for TANAJA, GANGER (MRN [id number]) as of 01/12/2016 16:53  Ref. Range 05/02/2015 12:41 08/15/2015 13:37 12/16/2015 10:01  CEA Latest Units: ng/mL 1.4 5.8 (H)   CEA Latest Ref Range: 0.0 - 4.7 ng/mL 2.6 9.6 (H) 189.8 (H)  CEA ([facility]) Latest Ref Range: 0.00 - 5.00 ng/mL   252.84 (H)    Pathology report: Diagnosis 05/17/2014 1. Colon, segmental resection for tumor, sigmoid, open end proximal - INVASIVE ADENOCARCINOMA WITH EXTRACELLULAR MUCIN, INVADING THROUGH THE MUSCULARIS PROPRIA INTO PERICOLONIC FATTY TISSUE. - THIRTEEN LYMPH NODES, NEGATIVE FOR METASTATIC CARCINOMA (0/13). - MULTIPLE DIVERTICULA. - RESECTION MARGINS, NEGATIVE FOR ATYPIA OR MALIGNANCY. - PLEASE SEE ONCOLOGY TEMPLATE FOR DETAILS. 2.  Colon, resection margin (donut), sigmoid - BENIGN COLONIC MUCOSA, NO EVIDENCE OF DYSPLASIA OR MALIGNANCY. Specimen: Left colon. Procedure: Segmental resection. Tumor site: Sigmoid colon. Specimen integrity: Intact. Macroscopic intactness of mesorectum: N/A Macroscopic tumor perforation: No Invasive tumor: Invasive adenocarcinoma with extracellular mucin. Maximum size: 8.0 cm, gross measurement. Histologic type(s): Invasive adenocarcinoma with extracellular mucin. Histologic grade and differentiation: G1: well differentiated/low grade Type of polyp in which invasive carcinoma arose: Villous adenoma. Microscopic extension of invasive tumor: Invading through the muscularis propria into pericolonic fatty tissue. Lymph-Vascular invasion: Not identified. Peri-neural invasion: Not identified. Tumor deposit(s) (discontinuous extramural extension): None. Resection margins: Negative. Proximal margin: 9.6 cm. Distal margin: 9.6 cm. Mesenteric margin (sigmoid and transverse): 6 cm. Treatment effect (neo-adjuvant therapy): No Additional polyp(s): N/A Non-neoplastic findings: Prominent Crohn-like reaction around the tumor. Lymph nodes: number examined 13; number positive: 0 Pathologic Staging: pT3, pN0, pMX  Diagnosis 12/08/2014  Colon, segmental resection for tumor, right colon MODERATELY DIFFERENTIATED ADENOCARCINOMA OF THE COLON (3.8 CM) THE TUMOR PENETRATES TO THE SURFACE OF THE VISCERAL PERITONEUM (T4A) LYMPHOVASCULAR INVASION IS IDENTIFIED MARGINS OF RESECTION ARE NEGATIVE FOR TUMOR NINTEEN BENIGN LYMPH NODE (0/19) ONE TUMOR DEPOSITE IS IDENTIFIED ([place]) Specimen: Colon Procedure: Segmental resection Tumor site: Ascending colon Specimen integrity: Intact Macroscopic intactness of mesorectum: Not applicable: Macroscopic tumor perforation: The tumor invades through the muscularis propria penatrate the surface of serosa Invasive tumor: Maximum size:  3.8 Histologic type(s):  Adenocarcinoma G2 G2: moderately differentiated/low grade Type of polyp in which invasive carcinoma arose: Tubular adenoma Microscopic extension of invasive tumor: Serosa Lymph-Vascular invasion: Present Peri-neural invasion: Negative Tumor deposit(s) (discontinuous extramural extension): Present Resection margins: Proximal margin: 12 cm Distal margin: 23 cm Circumferential (radial) (posterior ascending, posterior descending; lateral and posterior mid-rectum; and entire lower 1/3 rectum):3.5 cm Mesenteric margin (sigmoid and transverse): NA Distance closest margin (if all above margins negative): _ Treatment effect (neo-adjuvant therapy): Negative Additional polyp(s): Negative Non-neoplastic findings: Unremarkabel Lymph nodes: number examined 19; number positive: 0 Pathologic Staging: T4a, N1c, M_  MMR: NORMAL MSI: STABLE  Mismatch Repair (MMR) Protein Immunohistochemistry (IHC) IHC Expression Result: MLH1: Preserved nuclear expression (greater 50% tumor expression) MSH2: Preserved nuclear expression (greater 50% tumor expression) MSH6: Preserved nuclear expression (greater 50% tumor expression) PMS2: Preserved nuclear expression (greater 50% tumor expression) * Internal control demonstrates intact nuclear expression Interpretation: NORMAL   RADIOGRAPHIC STUDIES: I have personally reviewed the radiological images as listed and agreed with the findings in the report.  PET 10/10/2015 IMPRESSION: 1. Multiple enlarging, hypermetabolic peritoneal nodules consistent with peritoneal carcinomatosis. No generalized ascites. 2. No evidence of metastatic disease to the liver. 3. No suspicious pulmonary findings. Grossly stable chronic postinflammatory changes. 4. Multinodular goiter.  Diagnosis 11/04/2015 Peritoneum, biopsy, RLQ - METASTATIC COLORECTAL ADENOCARCINOMA. - SEE COMMENT. Microscopic Comment Dr. Burr Medico was paged on 11/07/2015. Per request, a block will be sent to  [facility] for additional testing and the results reported separately. (JBK:kh 11/07/2015)  ASSESSMENT & PLAN:  82 year old Caucasian female, with past medical history of colon diverticulosis and arthritis, but otherwise healthy and active, who was found to have a large sigmoid tumor, status post sigmoidectomy.  1. Sigmoid colon adenocarcinoma, pT3N0M0, stage II, G1, MSI stable, ascending colon adenocarcinoma, pT4aN1cM0, stage IIIB, MSI stable, G2, recurrent peritoneal carcinomatosis in 08/2015 -She had multifocal (2) colon cancer status post complete surgical resection,  -I reviewed her recent surgical pathology results in great details with her. The right colon cancer has several high-risk features, including stage IIIB, T4a lesion, tumor deposits, lymphovascular invasion, her risk of colon cancer recurrence is high.  -She tried adjuvant chemotherapy Xeloda, but could not tolerate full dose, and last cycle chemotherapy was canceled due to her tolerance issue. -I reviewed her restaging CT scan from 08/2015, which showed a 2 new peritoneal nodule, suspicious for peritoneal carcinomatosis. She is not symptomatic now. -Her to follow-up her CEA has also increased lately, concerning for cancer recurrence. -I reviewed her recent PET scan from 10/10/2015, which unfortunately showed multiple enlarging, hypermetabolic peritoneal nodules consistent with peritoneal carcinomatosis. No ascites or other metastasis. -I discussed her recent peritoneal mass biopsy, which confirmed metastatic colon cancer  -We discussed that her cancer is not curable at this stage, she is not a candidate for debulking and HIPEC, and her goal of care is palliation.  -Her [facility] genomic testing results were discussed with the patient. She has (+) KRAS mutation, not a candidate for EGFR inhibitor   -Her tumor has MSI stable, not a good candidate for immunotherapy alone  -I recommend her to consider 5-FU and Avastin. She had  second opinion at at [facility] by Dr. Reynaldo Minium, who recommended the same regimen. --Chemotherapy consent: Side effects including but does not not limited to, fatigue, nausea, vomiting, diarrhea, hair loss, neuropathy, fluid retention, renal and kidney dysfunction, neutropenic fever, needed for blood transfusion, bleeding, were discussed with patient in great detail. She agrees to proceed. -Due to  the upcoming holiday and her husband's illness and hospitalization, she would like to postpone her treatment until after Christmas [facility] placement by Dr. Marcello Moores before chemotherapy -today's lab results reviewed with pt  -Her last scan was 3 months ago, we'll repeat CT chest, abdomen and pelvis before she starts chemotherapy  2. Peripheral neuropathy, G 1 - noticed from second cycle of Xeloda, improved some since she came off chemotherapy.  3. GERD -she'll continue Protonix.  4.Lung nodules  -She has two small right lung nodules which were discovered on the CT scan in 09/2012,  RUL nodule slightly bigger on recent CT -She has been follow-up with Dr. Cyndia Bent.  -she never smoked, low risk for lung cancer -given the stability of the RUL nodule over 2 years, this is likely a benign lesion    Plan -Port placement by Dr. Marcello Moores in 2-3 weeks -We'll tentatively schedule her first chemotherapy 5-FU and Avastin on 02/08/2016 -Repeat CT scan of chest, abdomen and pelvis with contrast all week before her chemotherapy -I will call in EMLA cream, Compazine and Zofran to her pharmacy   All questions were answered. The patient knows to call the clinic with any problems, questions or concerns.  I spent 20 minutes counseling the patient face to face. The total time spent in the appointment was 25  minutes and more than 50% was on counseling.   Truitt Merle  01/12/2016

## 2016-01-12 NOTE — Telephone Encounter (Signed)
[facility] to schedule port-a-cath placement appointment with Dr. Marcello Moores. Spoke with Kennyth Lose and then was transferred to another scheduler to schedule appointment for the patient. Scheduler stated that she needed to speak with the nurse of Dr. Marcello Moores to see when the patient would need to be scheduled and then will call the patient with the appointment. Not sure when the patient will receive chemo. Patient aware that she is to have a CT scan, no orders placed for CT scan, notified desk nurse. Patient will come back for contrast.

## 2016-01-13 ENCOUNTER — Other Ambulatory Visit: Payer: Self-pay | Admitting: General Surgery

## 2016-01-13 ENCOUNTER — Telehealth: Payer: Self-pay | Admitting: *Deleted

## 2016-01-13 NOTE — Telephone Encounter (Signed)
Peer LOS I have scheduled appts and notified the sccheduler

## 2016-01-23 ENCOUNTER — Encounter (HOSPITAL_BASED_OUTPATIENT_CLINIC_OR_DEPARTMENT_OTHER): Payer: Self-pay | Admitting: *Deleted

## 2016-01-23 NOTE — Progress Notes (Signed)
NPO AFTER MN.  ARRIVE AT 1100.  CURRENT LAB RESULTS, CHEST CT, AND EKG. IN CHART AND EPIC.  WILL TAKE ZANTAC AM DOS W/ SIPS OF WATER.

## 2016-01-24 ENCOUNTER — Ambulatory Visit (HOSPITAL_COMMUNITY): Payer: PPO

## 2016-01-24 ENCOUNTER — Encounter (HOSPITAL_BASED_OUTPATIENT_CLINIC_OR_DEPARTMENT_OTHER)
Admission: RE | Disposition: A | Discharge status: Home / Self Care | Payer: Self-pay | Source: Ambulatory Visit | Attending: General Surgery

## 2016-01-24 ENCOUNTER — Ambulatory Visit (HOSPITAL_BASED_OUTPATIENT_CLINIC_OR_DEPARTMENT_OTHER)
Admission: RE | Admit: 2016-01-24 | Discharge: 2016-01-24 | Disposition: A | Discharge status: Home / Self Care | Payer: PPO | Source: Ambulatory Visit | Attending: General Surgery | Admitting: General Surgery

## 2016-01-24 ENCOUNTER — Ambulatory Visit (HOSPITAL_BASED_OUTPATIENT_CLINIC_OR_DEPARTMENT_OTHER): Payer: PPO | Admitting: Anesthesiology

## 2016-01-24 ENCOUNTER — Encounter (HOSPITAL_BASED_OUTPATIENT_CLINIC_OR_DEPARTMENT_OTHER): Payer: Self-pay | Admitting: *Deleted

## 2016-01-24 DIAGNOSIS — Z800 Family history of malignant neoplasm of digestive organs: Secondary | ICD-10-CM | POA: Diagnosis not present

## 2016-01-24 DIAGNOSIS — C182 Malignant neoplasm of ascending colon: Secondary | ICD-10-CM | POA: Insufficient documentation

## 2016-01-24 DIAGNOSIS — H3530 Unspecified macular degeneration: Secondary | ICD-10-CM | POA: Diagnosis not present

## 2016-01-24 DIAGNOSIS — K449 Diaphragmatic hernia without obstruction or gangrene: Secondary | ICD-10-CM | POA: Diagnosis not present

## 2016-01-24 DIAGNOSIS — C187 Malignant neoplasm of sigmoid colon: Secondary | ICD-10-CM | POA: Diagnosis not present

## 2016-01-24 DIAGNOSIS — C786 Secondary malignant neoplasm of retroperitoneum and peritoneum: Secondary | ICD-10-CM | POA: Insufficient documentation

## 2016-01-24 DIAGNOSIS — Z452 Encounter for adjustment and management of vascular access device: Secondary | ICD-10-CM | POA: Insufficient documentation

## 2016-01-24 DIAGNOSIS — C189 Malignant neoplasm of colon, unspecified: Secondary | ICD-10-CM | POA: Diagnosis not present

## 2016-01-24 DIAGNOSIS — Z880 Allergy status to penicillin: Secondary | ICD-10-CM | POA: Diagnosis not present

## 2016-01-24 DIAGNOSIS — I2510 Atherosclerotic heart disease of native coronary artery without angina pectoris: Secondary | ICD-10-CM | POA: Diagnosis not present

## 2016-01-24 DIAGNOSIS — Z79899 Other long term (current) drug therapy: Secondary | ICD-10-CM | POA: Insufficient documentation

## 2016-01-24 DIAGNOSIS — Z882 Allergy status to sulfonamides status: Secondary | ICD-10-CM | POA: Insufficient documentation

## 2016-01-24 DIAGNOSIS — Z85828 Personal history of other malignant neoplasm of skin: Secondary | ICD-10-CM | POA: Diagnosis not present

## 2016-01-24 DIAGNOSIS — R197 Diarrhea, unspecified: Secondary | ICD-10-CM | POA: Diagnosis not present

## 2016-01-24 DIAGNOSIS — Z823 Family history of stroke: Secondary | ICD-10-CM | POA: Insufficient documentation

## 2016-01-24 DIAGNOSIS — E042 Nontoxic multinodular goiter: Secondary | ICD-10-CM | POA: Insufficient documentation

## 2016-01-24 DIAGNOSIS — Z95828 Presence of other vascular implants and grafts: Secondary | ICD-10-CM

## 2016-01-24 DIAGNOSIS — J9811 Atelectasis: Secondary | ICD-10-CM | POA: Insufficient documentation

## 2016-01-24 DIAGNOSIS — Z791 Long term (current) use of non-steroidal anti-inflammatories (NSAID): Secondary | ICD-10-CM | POA: Diagnosis not present

## 2016-01-24 DIAGNOSIS — I712 Thoracic aortic aneurysm, without rupture: Secondary | ICD-10-CM | POA: Diagnosis not present

## 2016-01-24 DIAGNOSIS — Z9049 Acquired absence of other specified parts of digestive tract: Secondary | ICD-10-CM | POA: Diagnosis not present

## 2016-01-24 DIAGNOSIS — N816 Rectocele: Secondary | ICD-10-CM | POA: Diagnosis not present

## 2016-01-24 DIAGNOSIS — K219 Gastro-esophageal reflux disease without esophagitis: Secondary | ICD-10-CM | POA: Diagnosis not present

## 2016-01-24 DIAGNOSIS — Z8249 Family history of ischemic heart disease and other diseases of the circulatory system: Secondary | ICD-10-CM | POA: Insufficient documentation

## 2016-01-24 DIAGNOSIS — K648 Other hemorrhoids: Secondary | ICD-10-CM | POA: Insufficient documentation

## 2016-01-24 HISTORY — DX: Unspecified macular degeneration: H35.30

## 2016-01-24 HISTORY — DX: Aneurysm of the ascending aorta, without rupture: I71.21

## 2016-01-24 HISTORY — DX: Other specified postprocedural states: Z85.828

## 2016-01-24 HISTORY — DX: Other specified postprocedural states: Z98.890

## 2016-01-24 HISTORY — DX: Personal history of other diseases of the digestive system: Z87.19

## 2016-01-24 HISTORY — PX: PORTACATH PLACEMENT: SHX2246

## 2016-01-24 HISTORY — DX: Personal history of other specified conditions: Z87.898

## 2016-01-24 HISTORY — DX: Thoracic aortic aneurysm, without rupture: I71.2

## 2016-01-24 HISTORY — DX: Malignant (primary) neoplasm, unspecified: C80.1

## 2016-01-24 HISTORY — DX: Secondary malignant neoplasm of retroperitoneum and peritoneum: C78.6

## 2016-01-24 HISTORY — DX: Atherosclerotic heart disease of native coronary artery without angina pectoris: I25.10

## 2016-01-24 HISTORY — DX: Malignant neoplasm of colon, unspecified: C18.9

## 2016-01-24 HISTORY — DX: Solitary pulmonary nodule: R91.1

## 2016-01-24 HISTORY — DX: Nontoxic multinodular goiter: E04.2

## 2016-01-24 SURGERY — INSERTION, TUNNELED CENTRAL VENOUS DEVICE, WITH PORT
Anesthesia: Monitor Anesthesia Care | Site: Chest | Laterality: Right

## 2016-01-24 MED ORDER — FENTANYL CITRATE (PF) 100 MCG/2ML IJ SOLN
INTRAMUSCULAR | Status: DC | PRN
  Administered 2016-01-24: 50 ug via INTRAVENOUS
  Administered 2016-01-24: 25 ug via INTRAVENOUS

## 2016-01-24 MED ORDER — ACETAMINOPHEN 325 MG PO TABS
650.0000 mg | ORAL_TABLET | ORAL | Status: DC | PRN
  Filled 2016-01-24: qty 2

## 2016-01-24 MED ORDER — CLINDAMYCIN PHOSPHATE 600 MG/50ML IV SOLN
600.0000 mg | INTRAVENOUS | Status: AC
  Administered 2016-01-24: 600 mg via INTRAVENOUS
  Filled 2016-01-24: qty 50

## 2016-01-24 MED ORDER — ONDANSETRON HCL 4 MG/2ML IJ SOLN
INTRAMUSCULAR | Status: AC
  Filled 2016-01-24: qty 2

## 2016-01-24 MED ORDER — PROPOFOL 500 MG/50ML IV EMUL
INTRAVENOUS | Status: DC | PRN
  Administered 2016-01-24: 25 ug/kg/min via INTRAVENOUS

## 2016-01-24 MED ORDER — BUPIVACAINE HCL 0.25 % IJ SOLN
INTRAMUSCULAR | Status: DC | PRN
  Administered 2016-01-24: 20 mL

## 2016-01-24 MED ORDER — SODIUM CHLORIDE 0.9% FLUSH
3.0000 mL | Freq: Two times a day (BID) | INTRAVENOUS | Status: DC
  Filled 2016-01-24: qty 3

## 2016-01-24 MED ORDER — LACTATED RINGERS IV SOLN
INTRAVENOUS | Status: DC
  Administered 2016-01-24 (×2): via INTRAVENOUS
  Filled 2016-01-24: qty 1000

## 2016-01-24 MED ORDER — CLINDAMYCIN PHOSPHATE 600 MG/50ML IV SOLN
INTRAVENOUS | Status: AC
  Filled 2016-01-24: qty 50

## 2016-01-24 MED ORDER — HEPARIN SOD (PORK) LOCK FLUSH 100 UNIT/ML IV SOLN
INTRAVENOUS | Status: DC | PRN
  Administered 2016-01-24: 500 [IU]

## 2016-01-24 MED ORDER — FENTANYL CITRATE (PF) 100 MCG/2ML IJ SOLN
INTRAMUSCULAR | Status: AC
  Filled 2016-01-24: qty 2

## 2016-01-24 MED ORDER — SODIUM CHLORIDE 0.9 % IV SOLN
INTRAVENOUS | Status: DC | PRN
  Administered 2016-01-24: 15:00:00 20 mL

## 2016-01-24 MED ORDER — HEPARIN SOD (PORK) LOCK FLUSH 100 UNIT/ML IV SOLN
INTRAVENOUS | Status: AC
  Filled 2016-01-24: qty 10

## 2016-01-24 MED ORDER — LIDOCAINE 2% (20 MG/ML) 5 ML SYRINGE
INTRAMUSCULAR | Status: AC
  Filled 2016-01-24: qty 5

## 2016-01-24 MED ORDER — SODIUM CHLORIDE 0.9% FLUSH
3.0000 mL | INTRAVENOUS | Status: DC | PRN
  Filled 2016-01-24: qty 3

## 2016-01-24 MED ORDER — PROPOFOL 10 MG/ML IV BOLUS
INTRAVENOUS | Status: AC
  Filled 2016-01-24: qty 20

## 2016-01-24 MED ORDER — SODIUM CHLORIDE 0.9 % IV SOLN
Freq: Once | INTRAVENOUS | Status: DC
  Filled 2016-01-24 (×2): qty 1.2

## 2016-01-24 MED ORDER — LIDOCAINE 2% (20 MG/ML) 5 ML SYRINGE
INTRAMUSCULAR | Status: DC | PRN
  Administered 2016-01-24: 20 mg via INTRAVENOUS

## 2016-01-24 MED ORDER — BUPIVACAINE HCL (PF) 0.25 % IJ SOLN
INTRAMUSCULAR | Status: AC
  Filled 2016-01-24: qty 30

## 2016-01-24 MED ORDER — SODIUM CHLORIDE 0.9 % IV SOLN
250.0000 mL | INTRAVENOUS | Status: DC | PRN
  Filled 2016-01-24: qty 250

## 2016-01-24 MED ORDER — FENTANYL CITRATE (PF) 100 MCG/2ML IJ SOLN
25.0000 ug | INTRAMUSCULAR | Status: DC | PRN
  Filled 2016-01-24: qty 1

## 2016-01-24 MED ORDER — ACETAMINOPHEN 650 MG RE SUPP
650.0000 mg | RECTAL | Status: DC | PRN
  Filled 2016-01-24: qty 1

## 2016-01-24 SURGICAL SUPPLY — 44 items
ADH SKN CLS APL DERMABOND .7 (GAUZE/BANDAGES/DRESSINGS) ×1
BAG DECANTER FOR FLEXI CONT (MISCELLANEOUS) ×3 IMPLANT
BLADE SURG 15 STRL LF DISP TIS (BLADE) ×1 IMPLANT
BLADE SURG 15 STRL SS (BLADE) ×3
CHLORAPREP W/TINT 26ML (MISCELLANEOUS) ×3 IMPLANT
COVER BACK TABLE 60X90IN (DRAPES) ×3 IMPLANT
COVER MAYO STAND STRL (DRAPES) ×3 IMPLANT
DECANTER SPIKE VIAL GLASS SM (MISCELLANEOUS) ×3 IMPLANT
DERMABOND ADVANCED (GAUZE/BANDAGES/DRESSINGS) ×2
DERMABOND ADVANCED .7 DNX12 (GAUZE/BANDAGES/DRESSINGS) IMPLANT
DRAPE C-ARM 42X72 X-RAY (DRAPES) ×3 IMPLANT
DRAPE LAPAROTOMY TRNSV 102X78 (DRAPE) ×3 IMPLANT
DRSG TEGADERM 4X4.75 (GAUZE/BANDAGES/DRESSINGS) ×3 IMPLANT
ELECT REM PT RETURN 9FT ADLT (ELECTROSURGICAL) ×3
ELECTRODE REM PT RTRN 9FT ADLT (ELECTROSURGICAL) ×1 IMPLANT
GAUZE SPONGE 4X4 12PLY STRL (GAUZE/BANDAGES/DRESSINGS) ×6 IMPLANT
GAUZE SPONGE 4X4 16PLY XRAY LF (GAUZE/BANDAGES/DRESSINGS) ×3 IMPLANT
GLOVE BIO SURGEON STRL SZ 6.5 (GLOVE) ×2 IMPLANT
GLOVE BIO SURGEONS STRL SZ 6.5 (GLOVE) ×1
GLOVE BIOGEL PI IND STRL 7.0 (GLOVE) ×1 IMPLANT
GLOVE BIOGEL PI INDICATOR 7.0 (GLOVE) ×2
GOWN STRL REUS W/TWL 2XL LVL3 (GOWN DISPOSABLE) ×3 IMPLANT
GOWN STRL REUS W/TWL XL LVL3 (GOWN DISPOSABLE) ×3 IMPLANT
KIT PORT POWER 8FR ISP CVUE (Catheter) ×2 IMPLANT
KIT ROOM TURNOVER WOR (KITS) ×3 IMPLANT
LIQUID BAND (GAUZE/BANDAGES/DRESSINGS) ×3 IMPLANT
NDL HYPO 25X1 1.5 SAFETY (NEEDLE) ×1 IMPLANT
NEEDLE HYPO 25X1 1.5 SAFETY (NEEDLE) ×3 IMPLANT
PACK BASIN DAY SURGERY FS (CUSTOM PROCEDURE TRAY) ×3 IMPLANT
PENCIL BUTTON HOLSTER BLD 10FT (ELECTRODE) ×3 IMPLANT
SPONGE GAUZE 2X2 8PLY STER LF (GAUZE/BANDAGES/DRESSINGS) ×2
SPONGE GAUZE 2X2 8PLY STRL LF (GAUZE/BANDAGES/DRESSINGS) ×4 IMPLANT
SUT PROLENE 2 0 SH DA (SUTURE) ×3 IMPLANT
SUT VIC AB 3-0 SH 27 (SUTURE) ×3
SUT VIC AB 3-0 SH 27X BRD (SUTURE) ×1 IMPLANT
SUT VIC AB 4-0 PS2 18 (SUTURE) ×3 IMPLANT
SUT VICRYL 4-0 PS2 18IN ABS (SUTURE) ×2 IMPLANT
SYR CONTROL 10ML LL (SYRINGE) ×3 IMPLANT
SYRINGE 10CC LL (SYRINGE) ×3 IMPLANT
TOWEL OR 17X24 6PK STRL BLUE (TOWEL DISPOSABLE) ×6 IMPLANT
TOWEL OR NON WOVEN STRL DISP B (DISPOSABLE) ×3 IMPLANT
TRAY DSU PREP LF (CUSTOM PROCEDURE TRAY) ×2 IMPLANT
TUBE CONNECTING 12'X1/4 (SUCTIONS) ×1
TUBE CONNECTING 12X1/4 (SUCTIONS) ×1 IMPLANT

## 2016-01-24 NOTE — Anesthesia Preprocedure Evaluation (Addendum)
Anesthesia Evaluation  Patient identified by MRN, date of birth, ID band Patient awake    Reviewed: Allergy & Precautions, NPO status , Patient's Chart, lab work & pertinent test results  Airway Mallampati: II  TM Distance: >3 FB Neck ROM: Full    Dental  (+) Teeth Intact, Dental Advisory Given   Pulmonary neg pulmonary ROS,    breath sounds clear to auscultation       Cardiovascular + CAD and + Peripheral Vascular Disease   Rhythm:Regular Rate:Normal     Neuro/Psych  Neuromuscular disease negative psych ROS   GI/Hepatic Neg liver ROS, hiatal hernia, GERD  Medicated,  Endo/Other  negative endocrine ROS  Renal/GU negative Renal ROS  negative genitourinary   Musculoskeletal  (+) Arthritis , Osteoarthritis,    Abdominal   Peds negative pediatric ROS (+)  Hematology negative hematology ROS (+)   Anesthesia Other Findings   Reproductive/Obstetrics negative OB ROS                            Anesthesia Physical Anesthesia Plan  ASA: III  Anesthesia Plan: MAC   Post-op Pain Management:    Induction: Intravenous  Airway Management Planned: Natural Airway  Additional Equipment:   Intra-op Plan:   Post-operative Plan:   Informed Consent: I have reviewed the patients History and Physical, chart, labs and discussed the procedure including the risks, benefits and alternatives for the proposed anesthesia with the patient or authorized representative who has indicated his/her understanding and acceptance.   Dental advisory given  Plan Discussed with: CRNA  Anesthesia Plan Comments:        Anesthesia Quick Evaluation

## 2016-01-24 NOTE — Op Note (Signed)
01/24/2016  2:46 PM  PATIENT:  Heidi Marquez  82 y.o. female  Patient Care Team: Prince Solian, MD as PCP - General (Internal Medicine) Gaye Pollack, MD as Consulting Physician (Cardiothoracic Surgery) Gatha Mayer, MD as Consulting Physician (Gastroenterology) Leighton Ruff, MD as Consulting Physician (General Surgery) Tania Ade, RN as Registered Nurse (Medical Oncology)  PRE-OPERATIVE DIAGNOSIS:  stage 4 colon cancer  POST-OPERATIVE DIAGNOSIS:  stage 4 colon cancer  PROCEDURE:   INSERTION PORT-A-CATH  SURGEON:  Surgeon(s): Leighton Ruff, MD  ANESTHESIA:   local and MAC  EBL:  Total I/O In: 300 [I.V.:300] Out: -   DISPOSITION OF SPECIMEN:  PATHOLOGY  COUNTS:  YES  PLAN OF CARE: Discharge to home after PACU  PATIENT DISPOSITION:  PACU - hemodynamically stable.  INDICATION: Patient with need for IV chemotherapy. Port-A-Cath placement was requested.   Use of a central venous catheter for intravenous therapy was discussed. Technique of catheter placement using ultrasound and fluoroscopy guidance was discussed. Risks such as bleeding, infection, pneumothorax, catheter occlusion, reoperation, and other risks were discussed. I noted a good likelihood this will help address the problem. Questions were answered. The patient expressed understanding & wishes to proceed.   Findings: Normal-appearing anatomy.   8 [place] power port. It goes through the right internal jugular vein   Procedure: Informed consent was confirmed. Patient was brought the operating room and positioned supine. Arms were tucked. The patient underwent deep sedation. Neck and chest were prepped and draped in a sterile fashion. A surgical timeout confirmed our plan. I placed a field block of local anesthesia on the chest and neck.  I entered into the right internal jugular vein on the first venipuncture using US guidance. Non-pulsatile blood was returned. Wire was easily passed into the inferior  vena cava and confirmed by fluoroscopy. I confirmed placement of the wire in the right side of the chest.  I made an incision in the lateral infraclavicular area and made a subcutaneous pocket. I used a dilator on the wire using Seldinger technique to dilate the tract under fluoroscopy. I placed the catheter into the sheath. I then peeled away the dilator sheath. I tunneled the power port from the puncture site to the chest pocket. I cut the catheter to appropriate length and attached it to the port using the plastic connector. The port was placed into the pocket and secured to the left anterior chest wall using 2-0 Prolene interrupted stitches x2. Catheter flushed well.  Fluoroscopy confirmed the tip in the distal SVC. Catheter aspirated and flushed well. On final fluoro reevaluation the tip seen to be in good position in the distal SVC.  I closed the wounds using 3-0 Vicryl interrupted sutures for the pocket and 4-0 Monocryl stitch was used to close the skin. Dermabond was used on the 2 incisions. CXR will be performed in PACU. Patient should go home later today. Catheter is okay to use.

## 2016-01-24 NOTE — Discharge Instructions (Addendum)

## 2016-01-24 NOTE — Transfer of Care (Signed)
Immediate Anesthesia Transfer of Care Note  Patient: Heidi Marquez  Procedure(s) Performed: Procedure(s): INSERTION PORT-A-CATH (Right)  Patient Location: PACU  Anesthesia Type:MAC  Level of Consciousness: awake, alert  and oriented  Airway & Oxygen Therapy: Patient Spontanous Breathing  Post-op Assessment: Report given to RN and Post -op Vital signs reviewed and stable  Post vital signs: Reviewed and stable  Last Vitals:175/83,  63, 16, 99%, 97.1 Vitals:   01/24/16 1131  BP: (!) 172/75  Pulse: (!) 56  Resp: 16  Temp: 36.6 C    Last Pain:  Vitals:   01/24/16 1131  TempSrc: Oral      Patients Stated Pain Goal: 6 ([id number])  Complications: No apparent anesthesia complications

## 2016-01-24 NOTE — Anesthesia Procedure Notes (Signed)
Procedure Name: MAC Date/Time: 01/24/2016 1:57 PM Performed by: Bethena Roys T Pre-anesthesia Checklist: Patient identified, Emergency Drugs available, Suction available, Patient being monitored and Timeout performed Patient Re-evaluated:Patient Re-evaluated prior to inductionOxygen Delivery Method: Simple face mask Placement Confirmation: positive ETCO2

## 2016-01-24 NOTE — H&P (Signed)
HPI: 82 year old female who has developed stage IV colon cancer after 2 separate colon resections over the last few years.  She has elected to proceed with IV chemotherapy with her oncologist.  She is here today for port placement.  Past Medical History:  Diagnosis Date  . Adenocarcinoma of colon [facility]) oncologist-  dr Truitt Merle---  last chemo 01-17-2015 to 06-27-2015)   dx 05-17-2014 sigmoid (left) cancer, Stage 2, G1 (pT3N0M0), MSI stable -- s/p  sigmoidectomy /  dx 12-08-2014 ascending colon cancer (right) , Stage 3B, G2, (pT4aN1cM0), MSI stable -- s/p  right hemicolectomy/  08-2015  recurrent peritoneal carcinomatosis  . Arthritis    knees  . Coronary atherosclerosis   . Diverticulosis of colon   . GERD (gastroesophageal reflux disease)   . HH (hiatus hernia)   . History of basal cell carcinoma excision    right side nose 2013  s/p  moh's  . History of epistaxis    2005  caurterized twice  . History of esophageal dilatation    2009 for stricture  . History of esophagitis    reflux  . Internal hemorrhoids   . Macular degeneration of both eyes   . Multinodular thyroid    remote hx benign bx 2005  . Peritoneal carcinomatosis ([place])    secondary to primary colon cancer  . Pulmonary nodule, right    noted since 2014-- stable per last ct 06-15-2015  . Thoracic ascending aortic aneurysm [facility])    followed by dr Cyndia Bent ([facility])  last ct 06-15-2015  4.3cm   Past Surgical History:  Procedure Laterality Date  . COLONOSCOPY WITH PROPOFOL N/A 09/30/2014   Procedure: COLONOSCOPY WITH PROPOFOL;  Surgeon: Milus Banister, MD;  Location: [facility] ENDOSCOPY;  Service: Endoscopy;  Laterality: N/A;  . ESOPHAGOGASTRODUODENOSCOPY  2009  . INGUINAL HERNIA REPAIR Right early 1990s  . MOHS SURGERY  11/2011   nose  . ROBOTIC ASSISTED RIGHT HEMICOLECTOMY  78/46/9629    dr Leighton Ruff  . ROBOTIC ASSISTED SIGMOIDECTOMY  52/84/1324   dr Leighton Ruff  . UPPER GASTROINTESTINAL ENDOSCOPY    . VAGINAL HYSTERECTOMY   age 62   partial with the bladder tack   Social History   Social History  . Marital status: Married    Spouse name: N/A  . Number of children: N/A  . Years of education: N/A   Occupational History  . Not on file.   Social History Main Topics  . Smoking status: Never Smoker  . Smokeless tobacco: Never Used  . Alcohol use No  . Drug use: No  . Sexual activity: Not on file   Other Topics Concern  . Not on file   Social History Narrative   Married, husband is disabled (physical and dementia) and she is caregiver   Has #1 child (daughter) living. Son died of MI at age 65   Worked at [employer] before she retired   Has #3 cats   Family History  Problem Relation Age of Onset  . Colon cancer Paternal Grandfather 32    colon cancer   . Stroke Father   . Heart attack Son 82    died of heart attack   . Esophageal cancer Neg Hx   . Rectal cancer Neg Hx   . Stomach cancer Neg Hx    Allergies  Allergen Reactions  . Sulfa Antibiotics Swelling    Eyes,mouth  . Amoxicillin-Pot Clavulanate Rash  . Penicillins Swelling and Rash   Current Meds  Medication Sig  .  alendronate (FOSAMAX) 70 MG tablet Take 70 mg by mouth every 7 (seven) days. Sundays. Take with a full glass of water on an empty stomach.  . Ascorbic Acid (VITAMIN C) 1000 MG tablet Take 1,000 mg by mouth daily.  . Biotin 1000 MCG tablet Take 1,000 mcg by mouth daily.   Marland Kitchen CALCIUM & MAGNESIUM CARBONATES PO Take 1 tablet by mouth daily.  . cholecalciferol (VITAMIN D) 1000 UNITS tablet Take 1,000 Units by mouth daily.  Marland Kitchen glucosamine-chondroitin 500-400 MG tablet Take 1 tablet by mouth daily.   Marland Kitchen ibuprofen (ADVIL,MOTRIN) 200 MG tablet Take 200 mg by mouth daily as needed for moderate pain.   Marland Kitchen lidocaine-prilocaine (EMLA) cream Apply to affected area once  . loratadine (CLARITIN) 10 MG tablet Take 10 mg by mouth daily.  . Multiple Vitamin (MULTIVITAMIN) tablet Take 1 tablet by mouth daily.  . potassium chloride SA  (K-DUR,KLOR-CON) 20 MEQ tablet Take 1 tablet (20 mEq total) by mouth daily.  . ranitidine (ZANTAC) 150 MG tablet Take 150 mg by mouth as needed for heartburn.  . urea (CARMOL) 10 % cream Apply topically 2 (two) times daily. Apply to hand twice daily  . vitamin B-12 (CYANOCOBALAMIN) 1000 MCG tablet Take 1,000 mcg by mouth daily.    Review of Systems - General ROS: negative for - chills or fever Respiratory ROS: no cough, shortness of breath, or wheezing Cardiovascular ROS: no chest pain or dyspnea on exertion Gastrointestinal ROS: no abdominal pain, change in bowel habits, or black or bloody stools Genito-Urinary ROS: no dysuria, trouble voiding, or hematuria   Physical Exam  Constitutional: She is oriented to person, place, and time. She appears well-developed and well-nourished.  HENT:  Head: Normocephalic and atraumatic.  Eyes: Conjunctivae and EOM are normal. Pupils are equal, round, and reactive to light.  Neck: Normal range of motion. Neck supple.  Cardiovascular: Normal rate and regular rhythm.   Pulmonary/Chest: Effort normal and breath sounds normal. No respiratory distress.  Abdominal: Soft. She exhibits no distension. There is no tenderness.  Musculoskeletal: Normal range of motion.  Neurological: She is alert and oriented to person, place, and time.  Skin: Skin is warm and dry.   Assessment Stage 4 colon cancer  Plan: Insert port under US guidance for chemotherapy administration.  Risks include bleeding, infection, device malfunction and need for additional procedures.  I believe she understands this and has agreed to proceed with the surgery.

## 2016-01-25 ENCOUNTER — Encounter (HOSPITAL_BASED_OUTPATIENT_CLINIC_OR_DEPARTMENT_OTHER): Payer: Self-pay | Admitting: General Surgery

## 2016-01-25 NOTE — Anesthesia Postprocedure Evaluation (Signed)
Anesthesia Post Note  Patient: Heidi Marquez  Procedure(s) Performed: Procedure(s) (LRB): INSERTION PORT-A-CATH (Right)  Patient location during evaluation: PACU Anesthesia Type: MAC Level of consciousness: awake and alert Pain management: pain level controlled Vital Signs Assessment: post-procedure vital signs reviewed and stable Respiratory status: spontaneous breathing, nonlabored ventilation, respiratory function stable and patient connected to nasal cannula oxygen Cardiovascular status: stable and blood pressure returned to baseline Anesthetic complications: no     Last Vitals:  Vitals:   01/24/16 1600 01/24/16 1704  BP: (!) 160/76 (!) 188/78  Pulse: 65 (!) 56  Resp: 17 20  Temp:  36.8 C    Last Pain:  Vitals:   01/24/16 1704  TempSrc:   PainSc: 0-No pain   Pain Goal: Patients Stated Pain Goal: 6 (01/24/16 1138)               Effie Berkshire

## 2016-02-02 ENCOUNTER — Ambulatory Visit (HOSPITAL_COMMUNITY)
Admission: RE | Admit: 2016-02-02 | Discharge: 2016-02-02 | Disposition: A | Discharge status: Home / Self Care | Payer: PPO | Source: Ambulatory Visit | Attending: Hematology | Admitting: Hematology

## 2016-02-02 ENCOUNTER — Encounter (HOSPITAL_COMMUNITY): Payer: Self-pay

## 2016-02-02 DIAGNOSIS — C189 Malignant neoplasm of colon, unspecified: Secondary | ICD-10-CM | POA: Diagnosis not present

## 2016-02-02 DIAGNOSIS — C786 Secondary malignant neoplasm of retroperitoneum and peritoneum: Secondary | ICD-10-CM | POA: Diagnosis not present

## 2016-02-02 DIAGNOSIS — I712 Thoracic aortic aneurysm, without rupture: Secondary | ICD-10-CM | POA: Insufficient documentation

## 2016-02-02 DIAGNOSIS — C182 Malignant neoplasm of ascending colon: Secondary | ICD-10-CM | POA: Diagnosis not present

## 2016-02-02 DIAGNOSIS — R918 Other nonspecific abnormal finding of lung field: Secondary | ICD-10-CM | POA: Insufficient documentation

## 2016-02-02 DIAGNOSIS — C186 Malignant neoplasm of descending colon: Secondary | ICD-10-CM | POA: Diagnosis not present

## 2016-02-02 DIAGNOSIS — K8020 Calculus of gallbladder without cholecystitis without obstruction: Secondary | ICD-10-CM | POA: Diagnosis not present

## 2016-02-02 MED ORDER — IOPAMIDOL (ISOVUE-300) INJECTION 61%
100.0000 mL | Freq: Once | INTRAVENOUS | Status: AC | PRN
  Administered 2016-02-02: 100 mL via INTRAVENOUS

## 2016-02-02 MED ORDER — IOPAMIDOL (ISOVUE-300) INJECTION 61%
INTRAVENOUS | Status: AC
  Filled 2016-02-02: qty 100

## 2016-02-08 ENCOUNTER — Telehealth: Payer: Self-pay | Admitting: Hematology

## 2016-02-08 ENCOUNTER — Other Ambulatory Visit (HOSPITAL_BASED_OUTPATIENT_CLINIC_OR_DEPARTMENT_OTHER): Payer: PPO

## 2016-02-08 ENCOUNTER — Ambulatory Visit (HOSPITAL_BASED_OUTPATIENT_CLINIC_OR_DEPARTMENT_OTHER): Payer: PPO

## 2016-02-08 ENCOUNTER — Other Ambulatory Visit: Payer: Self-pay

## 2016-02-08 ENCOUNTER — Ambulatory Visit: Payer: Self-pay

## 2016-02-08 ENCOUNTER — Encounter: Payer: Self-pay | Admitting: *Deleted

## 2016-02-08 ENCOUNTER — Ambulatory Visit (HOSPITAL_BASED_OUTPATIENT_CLINIC_OR_DEPARTMENT_OTHER): Payer: PPO | Admitting: Hematology

## 2016-02-08 VITALS — BP 157/74 | HR 60 | Temp 98.4°F | Resp 17 | Ht 63.0 in | Wt 142.5 lb

## 2016-02-08 DIAGNOSIS — C182 Malignant neoplasm of ascending colon: Secondary | ICD-10-CM

## 2016-02-08 DIAGNOSIS — C186 Malignant neoplasm of descending colon: Secondary | ICD-10-CM

## 2016-02-08 DIAGNOSIS — R918 Other nonspecific abnormal finding of lung field: Secondary | ICD-10-CM

## 2016-02-08 DIAGNOSIS — Z5111 Encounter for antineoplastic chemotherapy: Secondary | ICD-10-CM

## 2016-02-08 DIAGNOSIS — G620 Drug-induced polyneuropathy: Secondary | ICD-10-CM

## 2016-02-08 DIAGNOSIS — Z5112 Encounter for antineoplastic immunotherapy: Secondary | ICD-10-CM

## 2016-02-08 DIAGNOSIS — K219 Gastro-esophageal reflux disease without esophagitis: Secondary | ICD-10-CM

## 2016-02-08 DIAGNOSIS — Z95828 Presence of other vascular implants and grafts: Secondary | ICD-10-CM | POA: Insufficient documentation

## 2016-02-08 LAB — CBC WITH DIFFERENTIAL/PLATELET
BASO%: 0.7 % (ref 0.0–2.0)
Basophils Absolute: 0 10*3/uL (ref 0.0–0.1)
EOS%: 3.9 % (ref 0.0–7.0)
Eosinophils Absolute: 0.2 10*3/uL (ref 0.0–0.5)
HEMATOCRIT: 35 % (ref 34.8–46.6)
HEMOGLOBIN: 11.6 g/dL (ref 11.6–15.9)
LYMPH#: 0.7 10*3/uL — AB (ref 0.9–3.3)
LYMPH%: 11.4 % — ABNORMAL LOW (ref 14.0–49.7)
MCH: 30.3 pg (ref 25.1–34.0)
MCHC: 33.1 g/dL (ref 31.5–36.0)
MCV: 91.4 fL (ref 79.5–101.0)
MONO#: 0.5 10*3/uL (ref 0.1–0.9)
MONO%: 8 % (ref 0.0–14.0)
NEUT%: 76 % (ref 38.4–76.8)
NEUTROS ABS: 4.5 10*3/uL (ref 1.5–6.5)
PLATELETS: 147 10*3/uL (ref 145–400)
RBC: 3.83 10*6/uL (ref 3.70–5.45)
RDW: 13.7 % (ref 11.2–14.5)
WBC: 5.9 10*3/uL (ref 3.9–10.3)

## 2016-02-08 LAB — COMPREHENSIVE METABOLIC PANEL
ALBUMIN: 3.6 g/dL (ref 3.5–5.0)
ALK PHOS: 101 U/L (ref 40–150)
ALT: 13 U/L (ref 0–55)
ANION GAP: 8 meq/L (ref 3–11)
AST: 23 U/L (ref 5–34)
BILIRUBIN TOTAL: 0.76 mg/dL (ref 0.20–1.20)
BUN: 20.9 mg/dL (ref 7.0–26.0)
CALCIUM: 8.9 mg/dL (ref 8.4–10.4)
CO2: 27 mEq/L (ref 22–29)
CREATININE: 0.8 mg/dL (ref 0.6–1.1)
Chloride: 109 mEq/L (ref 98–109)
EGFR: 68 mL/min/{1.73_m2} — ABNORMAL LOW (ref >90)
Glucose: 113 mg/dl (ref 70–140)
Potassium: 3.9 mEq/L (ref 3.5–5.1)
Sodium: 144 mEq/L (ref 136–145)
TOTAL PROTEIN: 6.5 g/dL (ref 6.4–8.3)

## 2016-02-08 LAB — UA PROTEIN, DIPSTICK - CHCC

## 2016-02-08 MED ORDER — SODIUM CHLORIDE 0.9 % IV SOLN
Freq: Once | INTRAVENOUS | Status: AC
  Administered 2016-02-08: 15:00:00 via INTRAVENOUS

## 2016-02-08 MED ORDER — PROCHLORPERAZINE MALEATE 10 MG PO TABS
10.0000 mg | ORAL_TABLET | Freq: Once | ORAL | Status: AC
  Administered 2016-02-08: 10 mg via ORAL

## 2016-02-08 MED ORDER — PROCHLORPERAZINE MALEATE 10 MG PO TABS
ORAL_TABLET | ORAL | Status: AC
Start: 2016-02-08 — End: 2016-02-08
  Filled 2016-02-08: qty 1

## 2016-02-08 MED ORDER — SODIUM CHLORIDE 0.9 % IV SOLN: Freq: Once | INTRAVENOUS | Status: DC

## 2016-02-08 MED ORDER — LEUCOVORIN CALCIUM INJECTION 350 MG
400.0000 mg/m2 | Freq: Once | INTRAVENOUS | Status: AC
  Administered 2016-02-08: 676 mg via INTRAVENOUS
  Filled 2016-02-08: qty 33.8

## 2016-02-08 MED ORDER — SODIUM CHLORIDE 0.9 % IV SOLN
5.0000 mg/kg | Freq: Once | INTRAVENOUS | Status: AC
  Administered 2016-02-08: 325 mg via INTRAVENOUS
  Filled 2016-02-08: qty 13

## 2016-02-08 MED ORDER — FLUOROURACIL CHEMO INJECTION 2.5 GM/50ML
400.0000 mg/m2 | Freq: Once | INTRAVENOUS | Status: AC
  Administered 2016-02-08: 700 mg via INTRAVENOUS
  Filled 2016-02-08: qty 14

## 2016-02-08 MED ORDER — SODIUM CHLORIDE 0.9% FLUSH
10.0000 mL | INTRAVENOUS | Status: DC | PRN
  Administered 2016-02-08: 10 mL via INTRAVENOUS
  Filled 2016-02-08: qty 10

## 2016-02-08 MED ORDER — FLUOROURACIL CHEMO INJECTION 5 GM/100ML
2400.0000 mg/m2 | INTRAVENOUS | Status: DC
  Administered 2016-02-08: 4050 mg via INTRAVENOUS
  Filled 2016-02-08: qty 81

## 2016-02-08 NOTE — Patient Instructions (Signed)
[facility] Discharge Instructions for Patients Receiving Chemotherapy  Today you received the following chemotherapy agents:  Avastin, leucovorin, Fluorouracil  To help prevent nausea and vomiting after your treatment, we encourage you to take your nausea medication as prescribed.   If you develop nausea and vomiting that is not controlled by your nausea medication, call the clinic.   BELOW ARE SYMPTOMS THAT SHOULD BE REPORTED IMMEDIATELY:  *FEVER GREATER THAN 100.5 F  *CHILLS WITH OR WITHOUT FEVER  NAUSEA AND VOMITING THAT IS NOT CONTROLLED WITH YOUR NAUSEA MEDICATION  *UNUSUAL SHORTNESS OF BREATH  *UNUSUAL BRUISING OR BLEEDING  TENDERNESS IN MOUTH AND THROAT WITH OR WITHOUT PRESENCE OF ULCERS  *URINARY PROBLEMS  *BOWEL PROBLEMS  UNUSUAL RASH Items with * indicate a potential emergency and should be followed up as soon as possible.  Feel free to call the clinic you have any questions or concerns. The clinic phone number is [phone].  Please show the [facility] at check-in to the Emergency Department and triage nurse.  Fluorouracil, 5-FU injection What is this medicine? FLUOROURACIL, 5-FU (flure oh YOOR a sil) is a chemotherapy drug. It slows the growth of cancer cells. This medicine is used to treat many types of cancer like breast cancer, colon or rectal cancer, pancreatic cancer, and stomach cancer. This medicine may be used for other purposes; ask your health care provider or pharmacist if you have questions. COMMON BRAND NAME(S): Adrucil What should I tell my health care provider before I take this medicine? They need to know if you have any of these conditions: -blood disorders -dihydropyrimidine dehydrogenase (DPD) deficiency -infection (especially a virus infection such as chickenpox, cold sores, or herpes) -kidney disease -liver disease -malnourished, poor nutrition -recent or ongoing radiation therapy -an unusual or allergic  reaction to fluorouracil, other chemotherapy, other medicines, foods, dyes, or preservatives -pregnant or trying to get pregnant -breast-feeding How should I use this medicine? This drug is given as an infusion or injection into a vein. It is administered in a hospital or clinic by a specially trained health care professional. Talk to your pediatrician regarding the use of this medicine in children. Special care may be needed. Overdosage: If you think you have taken too much of this medicine contact a poison control center or emergency room at once. NOTE: This medicine is only for you. Do not share this medicine with others. What if I miss a dose? It is important not to miss your dose. Call your doctor or health care professional if you are unable to keep an appointment. What may interact with this medicine? -allopurinol -cimetidine -dapsone -digoxin -hydroxyurea -leucovorin -levamisole -medicines for seizures like ethotoin, fosphenytoin, phenytoin -medicines to increase blood counts like filgrastim, pegfilgrastim, sargramostim -medicines that treat or prevent blood clots like warfarin, enoxaparin, and dalteparin -methotrexate -metronidazole -pyrimethamine -some other chemotherapy drugs like busulfan, cisplatin, estramustine, vinblastine -trimethoprim -trimetrexate -vaccines Talk to your doctor or health care professional before taking any of these medicines: -acetaminophen -aspirin -ibuprofen -ketoprofen -naproxen This list may not describe all possible interactions. Give your health care provider a list of all the medicines, herbs, non-prescription drugs, or dietary supplements you use. Also tell them if you smoke, drink alcohol, or use illegal drugs. Some items may interact with your medicine. What should I watch for while using this medicine? Visit your doctor for checks on your progress. This drug may make you feel generally unwell. This is not uncommon, as chemotherapy can  affect healthy cells as well  as cancer cells. Report any side effects. Continue your course of treatment even though you feel ill unless your doctor tells you to stop. In some cases, you may be given additional medicines to help with side effects. Follow all directions for their use. Call your doctor or health care professional for advice if you get a fever, chills or sore throat, or other symptoms of a cold or flu. Do not treat yourself. This drug decreases your body's ability to fight infections. Try to avoid being around people who are sick. This medicine may increase your risk to bruise or bleed. Call your doctor or health care professional if you notice any unusual bleeding. Be careful brushing and flossing your teeth or using a toothpick because you may get an infection or bleed more easily. If you have any dental work done, tell your dentist you are receiving this medicine. Avoid taking products that contain aspirin, acetaminophen, ibuprofen, naproxen, or ketoprofen unless instructed by your doctor. These medicines may hide a fever. Do not become pregnant while taking this medicine. Women should inform their doctor if they wish to become pregnant or think they might be pregnant. There is a potential for serious side effects to an unborn child. Talk to your health care professional or pharmacist for more information. Do not breast-feed an infant while taking this medicine. Men should inform their doctor if they wish to father a child. This medicine may lower sperm counts. Do not treat diarrhea with over the counter products. Contact your doctor if you have diarrhea that lasts more than 2 days or if it is severe and watery. This medicine can make you more sensitive to the sun. Keep out of the sun. If you cannot avoid being in the sun, wear protective clothing and use sunscreen. Do not use sun lamps or tanning beds/booths. What side effects may I notice from receiving this medicine? Side effects that  you should report to your doctor or health care professional as soon as possible: -allergic reactions like skin rash, itching or hives, swelling of the face, lips, or tongue -low blood counts - this medicine may decrease the number of white blood cells, red blood cells and platelets. You may be at increased risk for infections and bleeding. -signs of infection - fever or chills, cough, sore throat, pain or difficulty passing urine -signs of decreased platelets or bleeding - bruising, pinpoint red spots on the skin, black, tarry stools, blood in the urine -signs of decreased red blood cells - unusually weak or tired, fainting spells, lightheadedness -breathing problems -changes in vision -chest pain -mouth sores -nausea and vomiting -pain, swelling, redness at site where injected -pain, tingling, numbness in the hands or feet -redness, swelling, or sores on hands or feet -stomach pain -unusual bleeding Side effects that usually do not require medical attention (report to your doctor or health care professional if they continue or are bothersome): -changes in finger or toe nails -diarrhea -dry or itchy skin -hair loss -headache -loss of appetite -sensitivity of eyes to the light -stomach upset -unusually teary eyes This list may not describe all possible side effects. Call your doctor for medical advice about side effects. You may report side effects to FDA at 1-800-FDA-1088. Where should I keep my medicine? This drug is given in a hospital or clinic and will not be stored at home. NOTE: This sheet is a summary. It may not cover all possible information. If you have questions about this medicine, talk to your doctor, pharmacist,  or health care provider.  2017 [employer] Standard (2007-06-04 13:53:16) Leucovorin injection What is this medicine? LEUCOVORIN (loo koe VOR in) is used to prevent or treat the harmful effects of some medicines. This medicine is used to treat anemia caused  by a low amount of folic acid in the body. It is also used with 5-fluorouracil (5-FU) to treat colon cancer. This medicine may be used for other purposes; ask your health care provider or pharmacist if you have questions. What should I tell my health care provider before I take this medicine? They need to know if you have any of these conditions: -anemia from low levels of vitamin B-12 in the blood -an unusual or allergic reaction to leucovorin, folic acid, other medicines, foods, dyes, or preservatives -pregnant or trying to get pregnant -breast-feeding How should I use this medicine? This medicine is for injection into a muscle or into a vein. It is given by a health care professional in a hospital or clinic setting. Talk to your pediatrician regarding the use of this medicine in children. Special care may be needed. Overdosage: If you think you have taken too much of this medicine contact a poison control center or emergency room at once. NOTE: This medicine is only for you. Do not share this medicine with others. What if I miss a dose? This does not apply. What may interact with this medicine? -capecitabine -fluorouracil -phenobarbital -phenytoin -primidone -trimethoprim-sulfamethoxazole This list may not describe all possible interactions. Give your health care provider a list of all the medicines, herbs, non-prescription drugs, or dietary supplements you use. Also tell them if you smoke, drink alcohol, or use illegal drugs. Some items may interact with your medicine. What should I watch for while using this medicine? Your condition will be monitored carefully while you are receiving this medicine. This medicine may increase the side effects of 5-fluorouracil, 5-FU. Tell your doctor or health care professional if you have diarrhea or mouth sores that do not get better or that get worse. What side effects may I notice from receiving this medicine? Side effects that you should report to  your doctor or health care professional as soon as possible: -allergic reactions like skin rash, itching or hives, swelling of the face, lips, or tongue -breathing problems -fever, infection -mouth sores -unusual bleeding or bruising -unusually weak or tired Side effects that usually do not require medical attention (report to your doctor or health care professional if they continue or are bothersome): -constipation or diarrhea -loss of appetite -nausea, vomiting This list may not describe all possible side effects. Call your doctor for medical advice about side effects. You may report side effects to FDA at 1-800-FDA-1088. Where should I keep my medicine? This drug is given in a hospital or clinic and will not be stored at home. NOTE: This sheet is a summary. It may not cover all possible information. If you have questions about this medicine, talk to your doctor, pharmacist, or health care provider.  2017 [employer]/Gold Standard (2007-08-05 16:50:29) Bevacizumab injection What is this medicine? BEVACIZUMAB (be va SIZ yoo mab) is a monoclonal antibody. It is used to treat cervical cancer, colorectal cancer, glioblastoma multiforme, non-small cell lung cancer (NSCLC), ovarian cancer, and renal cell cancer. This medicine may be used for other purposes; ask your health care provider or pharmacist if you have questions. COMMON BRAND NAME(S): Avastin What should I tell my health care provider before I take this medicine? They need to know if you have any of  these conditions: -blood clots -heart disease, including heart failure, heart attack, or chest pain (angina) -high blood pressure -infection (especially a virus infection such as chickenpox, cold sores, or herpes) -kidney disease -lung disease -prior chemotherapy with doxorubicin, daunorubicin, epirubicin, or other anthracycline type chemotherapy agents -recent or ongoing radiation therapy -recent surgery -stroke -an unusual or  allergic reaction to bevacizumab, hamster proteins, mouse proteins, other medicines, foods, dyes, or preservatives -pregnant or trying to get pregnant -breast-feeding How should I use this medicine? This medicine is for infusion into a vein. It is given by a health care professional in a hospital or clinic setting. Talk to your pediatrician regarding the use of this medicine in children. Special care may be needed. Overdosage: If you think you have taken too much of this medicine contact a poison control center or emergency room at once. NOTE: This medicine is only for you. Do not share this medicine with others. What if I miss a dose? It is important not to miss your dose. Call your doctor or health care professional if you are unable to keep an appointment. What may interact with this medicine? Interactions are not expected. This list may not describe all possible interactions. Give your health care provider a list of all the medicines, herbs, non-prescription drugs, or dietary supplements you use. Also tell them if you smoke, drink alcohol, or use illegal drugs. Some items may interact with your medicine. What should I watch for while using this medicine? Your condition will be monitored carefully while you are receiving this medicine. You will need important blood work and urine testing done while you are taking this medicine. During your treatment, let your health care professional know if you have any unusual symptoms, such as difficulty breathing. This medicine may rarely cause 'gastrointestinal perforation' (holes in the stomach, intestines or colon), a serious side effect requiring surgery to repair. This medicine should be started at least 28 days following major surgery and the site of the surgery should be totally healed. Check with your doctor before scheduling dental work or surgery while you are receiving this treatment. Talk to your doctor if you have recently had surgery or if you  have a wound that has not healed. Do not become pregnant while taking this medicine or for 6 months after stopping it. Women should inform their doctor if they wish to become pregnant or think they might be pregnant. There is a potential for serious side effects to an unborn child. Talk to your health care professional or pharmacist for more information. Do not breast-feed an infant while taking this medicine. This medicine has caused ovarian failure in some women. This medicine may interfere with the ability to have a child. You should talk to your doctor or health care professional if you are concerned about your fertility. What side effects may I notice from receiving this medicine? Side effects that you should report to your doctor or health care professional as soon as possible: -allergic reactions like skin rash, itching or hives, swelling of the face, lips, or tongue -breathing problems -changes in vision -chest pain -confusion -jaw pain, especially after dental work -mouth sores -seizures -severe abdominal pain -severe headache -signs of decreased platelets or bleeding - bruising, pinpoint red spots on the skin, black, tarry stools, nosebleeds, blood in the urine -signs of infection - fever or chills, cough, sore throat, pain or trouble passing urine -sudden numbness or weakness of the face, arm or leg -swelling of legs or ankles -symptoms  of a stroke: change in mental awareness, inability to talk or move one side of the body (especially in patients with lung cancer) -trouble passing urine or change in the amount of urine -trouble speaking or understanding -trouble walking, dizziness, loss of balance or coordination Side effects that usually do not require medical attention (report to your doctor or health care professional if they continue or are bothersome): -constipation -diarrhea -dry skin -headache -loss of appetite -nausea, vomiting This list may not describe all possible  side effects. Call your doctor for medical advice about side effects. You may report side effects to FDA at 1-800-FDA-1088. Where should I keep my medicine? This drug is given in a hospital or clinic and will not be stored at home. NOTE: This sheet is a summary. It may not cover all possible information. If you have questions about this medicine, talk to your doctor, pharmacist, or health care provider.  2017 [employer] Standard (2015-01-21 15:28:53)

## 2016-02-08 NOTE — Progress Notes (Addendum)
- [place]  [phone]) [phone] Fax:(336) [phone]  Clinic Follow Up Note   Patient Care Team: Heidi Solian, MD as PCP - General (Internal Medicine) Heidi Pollack, MD as Consulting Physician (Cardiothoracic Surgery) Heidi Mayer, MD as Consulting Physician (Gastroenterology) Heidi Ruff, MD as Consulting Physician (General Surgery) Heidi Ade, RN as Registered Nurse (Medical Oncology) 02/08/2016  CHIEF COMPLAINTS:  Follow-up of colon cancer  Oncology History   Cancer of left colon [facility])   Staging form: Colon and Rectum, AJCC 7th Edition     Pathologic stage from 05/17/2014: Stage IIA (T3, N0, cM0) - Signed by Heidi Merle, MD on 02/21/2015 Cancer of right colon [facility])   Staging form: Colon and Rectum, AJCC 7th Edition     Pathologic stage from 12/08/2014: Stage IIIB (T4a, N1a, cM0) - Signed by Heidi Merle, MD on 02/21/2015       Cancer of right colon ([place])   04/09/2014 Procedure    Colonoscopy per Dr. Silvano Marquez: Large fleshy mass sigmoid colon 25 cm from anus. Could not pass through.      04/09/2014 Tumor Marker    CEA =11.7      04/12/2014 Imaging    CT C/A/P:sigmoid colon mass;several small areas of soft tissue nodularity in peritoneal cavity; small attenuation structure posterior right hepatic lobe; 2 stable nodules in lungs dating back to 2014. No acute findings.      05/17/2014 Initial Diagnosis    Colon cancer-presented with heme + stool and more frequent BMs      05/17/2014 Surgery    Robot assisted sigmoidectomy      05/17/2014 Pathologic Stage    pT3,pN0,pMX--Grade 1--0/13 nodes +--MMR normal      05/17/2014 Clinical Stage    Stage II      12/08/2014 Surgery    right hemicolectomy       12/08/2014 Pathology Results    right colon adenocarcinoma, pT4aN1c, 19 nodes were negative, LVI(+), MMR normal       01/17/2015 - 06/27/2015 Chemotherapy    adjuvant capecitabine 1528m q12h, 2 weeks on and 1 week off. dose reduction and stopped  after 6 cycles due to poor tolerance,       08/15/2015 Progression    CT abdomen showed peritoneal nodules, highly suspicious for recurrent metastasis      10/10/2015 Imaging    PET scan showed multiple enlarging, hypermetabolic peritoneal nodules consistent with peritoneal carcinomatosis. No other evidence of metastasis      11/04/2015 Pathology Results    Diagnosis 11/04/2015 Peritoneum, biopsy, RLQ - METASTATIC COLORECTAL ADENOCARCINOMA. - SEE COMMENT. -[facility]      02/02/2016 Imaging    CT CAP W CONTRAST 02/02/2016 IMPRESSION: 1. Extensive worsening of peritoneal spread of tumor with numerous large new tumor nodules scattered throughout The upper omentum and also scattered within along the mesentery. Mild extension into the right retroperitoneum at the level of the iliac crest. 2. Slight reduction in size of the right upper lobe pulmonary nodule 1.1 cm, previously 1.2 cm. Stable indistinct ground-glass density nodule in the right upper lobe. 3. Airway thickening is present, suggesting bronchitis or reactive airways disease. 4. Ascending aortic aneurysm 4.0 cm in diameter. Recommend annual imaging followup by CTA or MRA. This recommendation follows 2010 ACCF/AHA/AATS/ACR/ASA/SCA/SCAI/SIR/STS/SVM Guidelines for the Diagnosis and Management of Patients with Thoracic Aortic Disease. Circulation. 2010; 121: e266-e369 5. Cholelithiasis.       HISTORY OF PRESENTING ILLNESS:  Heidi DORMAN82y.o. female is here because of  recently diagnosed a stage II sigmoid colon cancer.  She had annual physical with her PCP in early Feb this year, and stool OB (+), no overt GI bleeding, CBC was normal. She had noticed some loose BM before that, but no nausea, loss of appetite, weight loss or other symoptoms. Colonoscopy showed a large mass in the sigmoid colon 25 cm from anus. Biopsy was taken which showed tubulovillous adenoma, no malignancy. She was referred to surgeon Dr. Marcello Marquez  and underwent robotic-assisted sigmoidectomy on 4/4. She tolerated the procedure well, and was discharged home afterwards.  She has recovered well from the surgery 3 weeks ago, she has mild fatigue, but otherwise doing very well without other complains. She had 2-3 episodes of bleeding after the surgery, which was related to her herromods. She is eating well. 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   She has chronic knee pain she takes ibuprofen once daily.    CURRENT THERAPY: chemotherapy with 5-Fu and Avastin. Starting today   INTERIM HISTORY: [name] returns for follow-up and first cycle chemo. She is doing well overall. She denies any pain, bloating, nausea or other symptoms. She is ready to start chemo. She appetite and energy level are decent and she functions well.    MEDICAL HISTORY:  Past Medical History:  Diagnosis Date  . Adenocarcinoma of colon [facility]) oncologist-  dr Heidi Marquez---  last chemo 01-17-2015 to 06-27-2015)   dx 05-17-2014 sigmoid (left) cancer, Stage 2, G1 (pT3N0M0), MSI stable -- s/p  sigmoidectomy /  dx 12-08-2014 ascending colon cancer (right) , Stage 3B, G2, (pT4aN1cM0), MSI stable -- s/p  right hemicolectomy/  08-2015  recurrent peritoneal carcinomatosis  . Arthritis    knees  . Coronary atherosclerosis   . Diverticulosis of colon   . GERD (gastroesophageal reflux disease)   . HH (hiatus hernia)   . History of basal cell carcinoma excision    right side nose 2013  s/p  moh's  . History of epistaxis    2005  caurterized twice  . History of esophageal dilatation    2009 for stricture  . History of esophagitis    reflux  . Internal hemorrhoids   . Macular degeneration of both eyes   . Multinodular thyroid    remote hx benign bx 2005  . Peritoneal carcinomatosis ([place])    secondary to primary colon cancer  . Pulmonary nodule, right    noted since 2014-- stable per last ct 06-15-2015  . Thoracic  ascending aortic aneurysm [facility])    followed by dr Heidi Marquez ([facility])  last ct 06-15-2015  4.3cm    SURGICAL HISTORY: Past Surgical History:  Procedure Laterality Date  . COLONOSCOPY WITH PROPOFOL N/A 09/30/2014   Procedure: COLONOSCOPY WITH PROPOFOL;  Surgeon: Milus Banister, MD;  Location: [facility];  Service: Endoscopy;  Laterality: N/A;  . ESOPHAGOGASTRODUODENOSCOPY  2009  . INGUINAL HERNIA REPAIR Right early 1990s  . MOHS SURGERY  11/2011   nose  . PORTACATH PLACEMENT Right 01/24/2016   Procedure: INSERTION PORT-A-CATH;  Surgeon: Heidi Ruff, MD;  Location: [facility];  Service: General;  Laterality: Right;  . ROBOTIC ASSISTED RIGHT HEMICOLECTOMY  34/19/6222    dr Heidi Marquez  . ROBOTIC ASSISTED SIGMOIDECTOMY  97/98/9211   dr Heidi Marquez  . UPPER GASTROINTESTINAL ENDOSCOPY    . VAGINAL HYSTERECTOMY  age 63   partial with the bladder tack    SOCIAL HISTORY: Social History  Social History  . Marital status: Married    Spouse name: N/A  . Number of children: N/A  . Years of education: N/A   Occupational History  . Not on file.   Social History Main Topics  . Smoking status: Never Smoker  . Smokeless tobacco: Never Used  . Alcohol use No  . Drug use: No  . Sexual activity: Not on file   Other Topics Concern  . Not on file   Social History Narrative   Married, husband is disabled (physical and dementia) and she is caregiver   Has #1 child (daughter) living. Son died of MI at age 82   Worked at [employer] before she retired   Has #3 cats    FAMILY HISTORY: Family History  Problem Relation Age of Onset  . Colon cancer Paternal Grandfather 41    colon cancer   . Stroke Father   . Heart attack Son 83    died of heart attack   . Esophageal cancer Neg Hx   . Rectal cancer Neg Hx   . Stomach cancer Neg Hx     ALLERGIES:  is allergic to sulfa antibiotics; amoxicillin-pot clavulanate; and penicillins.  MEDICATIONS:  Current  Outpatient Prescriptions  Medication Sig Dispense Refill  . alendronate (FOSAMAX) 70 MG tablet Take 70 mg by mouth every 7 (seven) days. Sundays. Take with a full glass of water on an empty stomach.    . Ascorbic Acid (VITAMIN C) 1000 MG tablet Take 1,000 mg by mouth daily.    . Biotin 1000 MCG tablet Take 1,000 mcg by mouth daily.     Marland Kitchen CALCIUM & MAGNESIUM CARBONATES PO Take 1 tablet by mouth daily.    . cholecalciferol (VITAMIN D) 1000 UNITS tablet Take 1,000 Units by mouth daily.    . diphenoxylate-atropine (LOMOTIL) 2.5-0.025 MG tablet Take 1-2 tablets by mouth 4 (four) times daily as needed for diarrhea or loose stools. 30 tablet 2  . glucosamine-chondroitin 500-400 MG tablet Take 1 tablet by mouth daily.     . Homeopathic Products ([employer] LEG CRAMPS PAIN RELIEF PO) Take 1 tablet by mouth daily as needed (for leg cramps).    Marland Kitchen ibuprofen (ADVIL,MOTRIN) 200 MG tablet Take 200 mg by mouth daily as needed for moderate pain.     Marland Kitchen lidocaine-prilocaine (EMLA) cream Apply to affected area once 30 g 3  . loratadine (CLARITIN) 10 MG tablet Take 10 mg by mouth daily.    . Multiple Vitamin (MULTIVITAMIN) tablet Take 1 tablet by mouth daily.    . ondansetron (ZOFRAN) 8 MG tablet Take 1 tablet (8 mg total) by mouth every 8 (eight) hours as needed (Nausea or vomiting). 30 tablet 1  . potassium chloride SA (K-DUR,KLOR-CON) 20 MEQ tablet Take 1 tablet (20 mEq total) by mouth daily. 20 tablet 1  . prochlorperazine (COMPAZINE) 10 MG tablet Take 1 tablet (10 mg total) by mouth every 8 (eight) hours as needed for nausea (Nausea or vomiting). 30 tablet 0  . ranitidine (ZANTAC) 150 MG tablet Take 150 mg by mouth as needed for heartburn.    . urea (CARMOL) 10 % cream Apply topically 2 (two) times daily. Apply to hand twice daily 142 g 0  . vitamin B-12 (CYANOCOBALAMIN) 1000 MCG tablet Take 1,000 mcg by mouth daily.      No current facility-administered medications for this visit.     REVIEW OF SYSTEMS:     Constitutional: Denies fevers, chills or abnormal night sweats Eyes: Denies  blurriness of vision, double vision or watery eyes Ears, nose, mouth, throat, and face: Denies mucositis or sore throat Respiratory: Denies cough, dyspnea or wheezes Cardiovascular: Denies palpitation, chest discomfort or lower extremity swelling Gastrointestinal:  Denies nausea, heartburn or change in bowel habits Skin: Denies abnormal skin rashes Lymphatics: Denies new lymphadenopathy or easy bruising Neurological:Denies numbness, tingling or new weaknesses Behavioral/Psych: Mood is stable, no new changes  All other systems were reviewed with the patient and are negative.   PHYSICAL EXAMINATION: ECOG PERFORMANCE STATUS: 1  Vitals:   02/08/16 1244  BP: (!) 157/74  Pulse: 60  Resp: 17  Temp: 98.4 F (36.9 C)   Filed Weights   02/08/16 1244  Weight: 142 lb 8 oz (64.6 kg)    GENERAL:alert, no distress and comfortable SKIN: skin color, texture, turgor are normal, no rashes or significant lesions  EYES: normal, conjunctiva are pink and non-injected, sclera clear OROPHARYNX:no exudate, no erythema and lips, buccal mucosa, and tongue normal  NECK: supple, thyroid normal size, non-tender, without nodularity LYMPH:  no palpable lymphadenopathy in the cervical, axillary or inguinal LUNGS: clear to auscultation and percussion with normal breathing effort HEART: regular rate & rhythm and no murmurs and no lower extremity edema ABDOMEN:abdomen soft, surgical incision and to the low abdomen is well healed. non-tender and normal bowel sounds Musculoskeletal:no cyanosis of digits and no clubbing  PSYCH: alert & oriented x 3 with fluent speech NEURO: no focal motor/sensory deficits   LABORATORY DATA:  I have reviewed the data as listed CBC Latest Ref Rng & Units 02/08/2016 01/12/2016 12/16/2015  WBC 3.9 - 10.3 10e3/uL 5.9 7.1 5.9  Hemoglobin 11.6 - 15.9 g/dL 11.6 13.4 12.8  Hematocrit 34.8 - 46.6 % 35.0 41.3  38.6  Platelets 145 - 400 10e3/uL 147 159 148   CMP Latest Ref Rng & Units 02/08/2016 01/12/2016 12/16/2015  Glucose 70 - 140 mg/dl 113 124 83  BUN 7.0 - 26.0 mg/dL 20.9 21.6 16.2  Creatinine 0.6 - 1.1 mg/dL 0.8 0.9 0.8  Sodium 136 - 145 mEq/L 144 142 142  Potassium 3.5 - 5.1 mEq/L 3.9 4.3 4.4  Chloride 101 - 111 mmol/L - - -  CO2 22 - 29 mEq/L '27 29 29  ' Calcium 8.4 - 10.4 mg/dL 8.9 10.2 9.5  Total Protein 6.4 - 8.3 g/dL 6.5 7.4 7.0  Total Bilirubin 0.20 - 1.20 mg/dL 0.76 0.90 0.59  Alkaline Phos 40 - 150 U/L 101 112 123  AST 5 - 34 U/L '23 23 29  ' ALT 0 - 55 U/L '13 11 19   ' Results for BIRIDIANA, TWARDOWSKI (MRN [id number]) as of 01/12/2016 16:53  Ref. Range 05/02/2015 12:41 08/15/2015 13:37 12/16/2015 10:01  CEA Latest Units: ng/mL 1.4 5.8 (H)   CEA Latest Ref Range: 0.0 - 4.7 ng/mL 2.6 9.6 (H) 189.8 (H)  CEA ([facility]) Latest Ref Range: 0.00 - 5.00 ng/mL   252.84 (H)    Pathology report:  Diagnosis 11/04/2015 Peritoneum, biopsy, RLQ - METASTATIC COLORECTAL ADENOCARCINOMA. - SEE COMMENT.  [facility] 11/04/2015   Diagnosis 12/08/2014  Colon, segmental resection for tumor, right colon MODERATELY DIFFERENTIATED ADENOCARCINOMA OF THE COLON (3.8 CM) THE TUMOR PENETRATES TO THE SURFACE OF THE VISCERAL PERITONEUM (T4A) LYMPHOVASCULAR INVASION IS IDENTIFIED MARGINS OF RESECTION ARE NEGATIVE FOR TUMOR NINTEEN BENIGN LYMPH NODE (0/19) ONE TUMOR DEPOSITE IS IDENTIFIED ([place]) Specimen: Colon Procedure: Segmental resection Tumor site: Ascending colon Specimen integrity: Intact Macroscopic intactness of mesorectum: Not applicable: Macroscopic tumor perforation: The tumor invades through the  muscularis propria penatrate the surface of serosa Invasive tumor: Maximum size: 3.8 Histologic type(s): Adenocarcinoma G2 G2: moderately differentiated/low grade Type of polyp in which invasive carcinoma arose: Tubular adenoma Microscopic extension of invasive tumor: Serosa Lymph-Vascular  invasion: Present Peri-neural invasion: Negative Tumor deposit(s) (discontinuous extramural extension): Present Resection margins: Proximal margin: 12 cm Distal margin: 23 cm Circumferential (radial) (posterior ascending, posterior descending; lateral and posterior mid-rectum; and entire lower 1/3 rectum):3.5 cm Mesenteric margin (sigmoid and transverse): NA Distance closest margin (if all above margins negative): _ Treatment effect (neo-adjuvant therapy): Negative Additional polyp(s): Negative Non-neoplastic findings: Unremarkabel Lymph nodes: number examined 19; number positive: 0 Pathologic Staging: T4a, N1c, M_  MMR: NORMAL MSI: STABLE  Mismatch Repair (MMR) Protein Immunohistochemistry (IHC) IHC Expression Result: MLH1: Preserved nuclear expression (greater 50% tumor expression) MSH2: Preserved nuclear expression (greater 50% tumor expression) MSH6: Preserved nuclear expression (greater 50% tumor expression) PMS2: Preserved nuclear expression (greater 50% tumor expression) * Internal control demonstrates intact nuclear expression Interpretation: NORMAL  Diagnosis 05/17/2014 1. Colon, segmental resection for tumor, sigmoid, open end proximal - INVASIVE ADENOCARCINOMA WITH EXTRACELLULAR MUCIN, INVADING THROUGH THE MUSCULARIS PROPRIA INTO PERICOLONIC FATTY TISSUE. - THIRTEEN LYMPH NODES, NEGATIVE FOR METASTATIC CARCINOMA (0/13). - MULTIPLE DIVERTICULA. - RESECTION MARGINS, NEGATIVE FOR ATYPIA OR MALIGNANCY. - PLEASE SEE ONCOLOGY TEMPLATE FOR DETAILS. 2. Colon, resection margin (donut), sigmoid - BENIGN COLONIC MUCOSA, NO EVIDENCE OF DYSPLASIA OR MALIGNANCY. Specimen: Left colon. Procedure: Segmental resection. Tumor site: Sigmoid colon. Specimen integrity: Intact. Macroscopic intactness of mesorectum: N/A Macroscopic tumor perforation: No Invasive tumor: Invasive adenocarcinoma with extracellular mucin. Maximum size: 8.0 cm, gross measurement. Histologic type(s):  Invasive adenocarcinoma with extracellular mucin. Histologic grade and differentiation: G1: well differentiated/low grade Type of polyp in which invasive carcinoma arose: Villous adenoma. Microscopic extension of invasive tumor: Invading through the muscularis propria into pericolonic fatty tissue. Lymph-Vascular invasion: Not identified. Peri-neural invasion: Not identified. Tumor deposit(s) (discontinuous extramural extension): None. Resection margins: Negative. Proximal margin: 9.6 cm. Distal margin: 9.6 cm. Mesenteric margin (sigmoid and transverse): 6 cm. Treatment effect (neo-adjuvant therapy): No Additional polyp(s): N/A Non-neoplastic findings: Prominent Crohn-like reaction around the tumor. Lymph nodes: number examined 13; number positive: 0 Pathologic Staging: pT3, pN0, pMX   RADIOGRAPHIC STUDIES: I have personally reviewed the radiological images as listed and agreed with the findings in the report.  CT CAP W CONTRAST 02/02/2016 IMPRESSION: 1. Extensive worsening of peritoneal spread of tumor with numerous large new tumor nodules scattered throughout The upper omentum and also scattered within along the mesentery. Mild extension into the right retroperitoneum at the level of the iliac crest. 2. Slight reduction in size of the right upper lobe pulmonary nodule 1.1 cm, previously 1.2 cm. Stable indistinct ground-glass density nodule in the right upper lobe. 3. Airway thickening is present, suggesting bronchitis or reactive airways disease. 4. Ascending aortic aneurysm 4.0 cm in diameter. Recommend annual imaging followup by CTA or MRA. This recommendation follows 2010 ACCF/AHA/AATS/ACR/ASA/SCA/SCAI/SIR/STS/SVM Guidelines for the Diagnosis and Management of Patients with Thoracic Aortic Disease. Circulation. 2010; 121: e266-e369 5. Cholelithiasis.  PET 10/10/2015 IMPRESSION: 1. Multiple enlarging, hypermetabolic peritoneal nodules consistent with peritoneal  carcinomatosis. No generalized ascites. 2. No evidence of metastatic disease to the liver. 3. No suspicious pulmonary findings. Grossly stable chronic postinflammatory changes. 4. Multinodular goiter.   ASSESSMENT & PLAN:  82 y.o. Caucasian female, with past medical history of colon diverticulosis and arthritis, but otherwise healthy and active, who was found to have a large sigmoid tumor, status post sigmoidectomy.  1. Sigmoid  colon adenocarcinoma, pT3N0M0, stage II, G1, MSI stable, ascending colon adenocarcinoma, pT4aN1cM0, stage IIIB, MSI stable, G2, recurrent peritoneal carcinomatosis in 08/2015 -She had multifocal (2) colon cancer status post complete surgical resection,  -I reviewed her recent surgical pathology results in great details with her. The right colon cancer has several high-risk features, including stage IIIB, T4a lesion, tumor deposits, lymphovascular invasion, her risk of colon cancer recurrence is high.  -She tried adjuvant chemotherapy Xeloda, but could not tolerate full dose, and last cycle chemotherapy was canceled due to her tolerance issue. -I reviewed her restaging CT scan from 08/2015, which showed a 2 new peritoneal nodule, suspicious for peritoneal carcinomatosis. She is not symptomatic now. -Her to follow-up her CEA has also increased lately, concerning for cancer recurrence. -I reviewed her recent PET scan from 10/10/2015, which unfortunately showed multiple enlarging, hypermetabolic peritoneal nodules consistent with peritoneal carcinomatosis. No ascites or other metastasis. -I discussed her recent peritoneal mass biopsy, which confirmed metastatic colon cancer  -We previously discussed that her cancer is not curable at this stage, she is not a candidate for debulking and HIPEC, and her goal of care is palliation.  -Her [facility] genomic testing results were discussed with the patient. She has (+) KRAS mutation, not a candidate for EGFR inhibitor   -Her tumor  has MSI stable, not a good candidate for immunotherapy alone  -she has finally decided to pursue chemo with 5-fu and Avastin. I reviewed potential side effects with her again, she agrees to proceed.  -We discussed her repeat CT chest, abdomen and pelvis from 02/02/16, showing worsening spread of tumor, but she is clinically asymptomatic  -today's lab results reviewed with pt, adequate for treatment, we'll proceed first cycle today.     2. Peripheral neuropathy, G 1 - noticed from second cycle of Xeloda, improved some since she came off chemotherapy.  3. GERD -she'll continue Protonix.  4.Lung nodules  -She has two small right lung nodules which were discovered on the CT scan in 09/2012,  RUL nodule slightly smaller on recent CT -She has been follow-up with Dr. Cyndia Marquez.  -she never smoked, low risk for lung cancer -given the stability of the RUL nodule over 2 years, this is likely a benign lesion    Plan -scan and lab results reviewed, we'll proceed first cycle of 5-FU and Avastin today. -Lab, follow-up and chemotherapy in 2 weeks.   All questions were answered. The patient knows to call the clinic with any problems, questions or concerns.  I spent 20 minutes counseling the patient face to face. The total time spent in the appointment was 25  minutes and more than 50% was on counseling.   Heidi Marquez  02/08/2016  This document serves as a record of services personally performed by Heidi Merle, MD. It was created on her behalf by Darcus Austin, a trained medical scribe. The creation of this record is based on the scribe's personal observations and the provider's statements to them. This document has been checked and approved by the attending provider.

## 2016-02-08 NOTE — Progress Notes (Signed)
Oncology Nurse Navigator Documentation  Oncology Nurse Navigator Flowsheets 02/08/2016  Navigator Location [facility]-  Navigator Encounter Type Treatment  Telephone -  Treatment Initiated Date 02/08/2016  Patient Visit Type MedOnc  Treatment Phase First Chemo Tx  Barriers/Navigation Needs Education  Education  Symptom Management--potential side effects fo 5FU and avastin and when to call office. How to take antiemetics  Interventions Education  Education Method Verbal;Written  Support Groups/Services GI Support Group and [facility] Support services  Acuity Level 1  Time Spent with Patient 15

## 2016-02-08 NOTE — Telephone Encounter (Signed)
Appointments scheduled per 12/27 LOS. Patient given AVS report and calendars with future scheduled appointments. °

## 2016-02-10 ENCOUNTER — Ambulatory Visit (HOSPITAL_BASED_OUTPATIENT_CLINIC_OR_DEPARTMENT_OTHER): Payer: PPO

## 2016-02-10 ENCOUNTER — Other Ambulatory Visit: Payer: Self-pay

## 2016-02-10 VITALS — BP 160/79 | HR 73 | Temp 98.7°F | Resp 18

## 2016-02-10 DIAGNOSIS — C186 Malignant neoplasm of descending colon: Secondary | ICD-10-CM | POA: Diagnosis not present

## 2016-02-10 DIAGNOSIS — C182 Malignant neoplasm of ascending colon: Secondary | ICD-10-CM

## 2016-02-10 MED ORDER — HEPARIN SOD (PORK) LOCK FLUSH 100 UNIT/ML IV SOLN
500.0000 [IU] | Freq: Once | INTRAVENOUS | Status: AC | PRN
  Administered 2016-02-10: 500 [IU]
  Filled 2016-02-10: qty 5

## 2016-02-10 MED ORDER — SODIUM CHLORIDE 0.9% FLUSH
10.0000 mL | INTRAVENOUS | Status: DC | PRN
  Administered 2016-02-10: 10 mL
  Filled 2016-02-10: qty 10

## 2016-02-11 ENCOUNTER — Encounter: Payer: Self-pay | Admitting: Hematology

## 2016-02-14 ENCOUNTER — Other Ambulatory Visit: Payer: PPO

## 2016-02-14 ENCOUNTER — Telehealth: Payer: Self-pay | Admitting: Hematology and Oncology

## 2016-02-14 ENCOUNTER — Encounter: Payer: Self-pay | Admitting: *Deleted

## 2016-02-14 ENCOUNTER — Telehealth: Payer: Self-pay | Admitting: *Deleted

## 2016-02-14 NOTE — Telephone Encounter (Signed)
Per LOS I have scheduled appt and notified the scheduelr

## 2016-02-14 NOTE — Telephone Encounter (Signed)
Appointments scheduled per 1/2 LOS. Patient given AVS report and calendars with future scheduled appointments. °

## 2016-02-16 ENCOUNTER — Encounter: Payer: Self-pay | Admitting: Hematology

## 2016-02-16 NOTE — Progress Notes (Signed)
Called pt & introduced myself as her FA and to discuss copay assistance.  Pt gave me consent to apply in her behalf so she was approved w/ PAN from 11/18/15 to 02/14/17 for $2,800.  Emailed copy of approval letter to Pearletha Furl, Waldon Merl and [place] in billing and gave a copy to HIM to scan in pt's chart.  Pt is not eligible for the [place] because she exceeds the income requirements.

## 2016-02-17 ENCOUNTER — Telehealth: Payer: Self-pay | Admitting: *Deleted

## 2016-02-17 NOTE — Telephone Encounter (Signed)
Received vm call from pt this pm stating that she had a concern regarding rectal bleeding & asked for return call ASAP & not at end of day.  Returned call & she states she had a terrible night & was up 5-7 x's with loose (not really diarrhea) stools that were bloody.  She reports abdominal pain-uncomfortable & gas.  She states the blood & stools have improved over the course of the day.  She has taken imodium @ 6 last night & maybe 4 today.  She started with 1 tab & has only taken 1 tab per time.  Requested she take 2 now & if not helping, take lomotil.  Discussed with Dr Burr Medico & if bleeding & discomfort improves-great but if not she should go to the ED.  She also report nose bleed-not dripping but when she wipes her nose.  She states this was going on before avastin. Discussed neosporin to keep mucous membranes moisturized BID.   Encouraged pt to call on Monday to let us know how she is doing.

## 2016-02-23 ENCOUNTER — Ambulatory Visit: Payer: Self-pay | Admitting: Hematology

## 2016-02-23 ENCOUNTER — Other Ambulatory Visit (HOSPITAL_BASED_OUTPATIENT_CLINIC_OR_DEPARTMENT_OTHER): Payer: PPO

## 2016-02-23 ENCOUNTER — Ambulatory Visit (HOSPITAL_BASED_OUTPATIENT_CLINIC_OR_DEPARTMENT_OTHER): Payer: PPO | Admitting: Hematology

## 2016-02-23 ENCOUNTER — Ambulatory Visit (HOSPITAL_BASED_OUTPATIENT_CLINIC_OR_DEPARTMENT_OTHER): Payer: PPO

## 2016-02-23 ENCOUNTER — Encounter: Payer: Self-pay | Admitting: Hematology

## 2016-02-23 ENCOUNTER — Other Ambulatory Visit: Payer: Self-pay

## 2016-02-23 VITALS — BP 152/67 | HR 65 | Temp 98.4°F | Resp 18 | Wt 140.5 lb

## 2016-02-23 DIAGNOSIS — R197 Diarrhea, unspecified: Secondary | ICD-10-CM

## 2016-02-23 DIAGNOSIS — C182 Malignant neoplasm of ascending colon: Secondary | ICD-10-CM

## 2016-02-23 DIAGNOSIS — K625 Hemorrhage of anus and rectum: Secondary | ICD-10-CM | POA: Diagnosis not present

## 2016-02-23 DIAGNOSIS — C186 Malignant neoplasm of descending colon: Secondary | ICD-10-CM

## 2016-02-23 DIAGNOSIS — Z5112 Encounter for antineoplastic immunotherapy: Secondary | ICD-10-CM | POA: Diagnosis not present

## 2016-02-23 DIAGNOSIS — Z5111 Encounter for antineoplastic chemotherapy: Secondary | ICD-10-CM | POA: Diagnosis not present

## 2016-02-23 DIAGNOSIS — R918 Other nonspecific abnormal finding of lung field: Secondary | ICD-10-CM

## 2016-02-23 DIAGNOSIS — G620 Drug-induced polyneuropathy: Secondary | ICD-10-CM

## 2016-02-23 DIAGNOSIS — C187 Malignant neoplasm of sigmoid colon: Secondary | ICD-10-CM

## 2016-02-23 DIAGNOSIS — K219 Gastro-esophageal reflux disease without esophagitis: Secondary | ICD-10-CM

## 2016-02-23 DIAGNOSIS — Z95828 Presence of other vascular implants and grafts: Secondary | ICD-10-CM

## 2016-02-23 LAB — CBC WITH DIFFERENTIAL/PLATELET
BASO%: 0.8 % (ref 0.0–2.0)
BASOS ABS: 0 10*3/uL (ref 0.0–0.1)
EOS%: 5.2 % (ref 0.0–7.0)
Eosinophils Absolute: 0.3 10*3/uL (ref 0.0–0.5)
HEMATOCRIT: 30.6 % — AB (ref 34.8–46.6)
HEMOGLOBIN: 10.4 g/dL — AB (ref 11.6–15.9)
LYMPH#: 0.8 10*3/uL — AB (ref 0.9–3.3)
LYMPH%: 15.2 % (ref 14.0–49.7)
MCH: 31.1 pg (ref 25.1–34.0)
MCHC: 34 g/dL (ref 31.5–36.0)
MCV: 91.5 fL (ref 79.5–101.0)
MONO#: 0.5 10*3/uL (ref 0.1–0.9)
MONO%: 9.4 % (ref 0.0–14.0)
NEUT#: 3.7 10*3/uL (ref 1.5–6.5)
NEUT%: 69.4 % (ref 38.4–76.8)
PLATELETS: 143 10*3/uL — AB (ref 145–400)
RBC: 3.34 10*6/uL — ABNORMAL LOW (ref 3.70–5.45)
RDW: 13.1 % (ref 11.2–14.5)
WBC: 5.3 10*3/uL (ref 3.9–10.3)

## 2016-02-23 LAB — COMPREHENSIVE METABOLIC PANEL
ALBUMIN: 3.3 g/dL — AB (ref 3.5–5.0)
ALK PHOS: 80 U/L (ref 40–150)
ALT: 8 U/L (ref 0–55)
ANION GAP: 7 meq/L (ref 3–11)
AST: 19 U/L (ref 5–34)
BILIRUBIN TOTAL: 0.48 mg/dL (ref 0.20–1.20)
BUN: 22.4 mg/dL (ref 7.0–26.0)
CALCIUM: 8.8 mg/dL (ref 8.4–10.4)
CO2: 26 mEq/L (ref 22–29)
CREATININE: 0.9 mg/dL (ref 0.6–1.1)
Chloride: 110 mEq/L — ABNORMAL HIGH (ref 98–109)
EGFR: 59 mL/min/{1.73_m2} — ABNORMAL LOW (ref >90)
Glucose: 97 mg/dl (ref 70–140)
Potassium: 3.9 mEq/L (ref 3.5–5.1)
Sodium: 143 mEq/L (ref 136–145)
TOTAL PROTEIN: 6 g/dL — AB (ref 6.4–8.3)

## 2016-02-23 MED ORDER — ALTEPLASE 2 MG IJ SOLR
2.0000 mg | Freq: Once | INTRAMUSCULAR | Status: DC | PRN
  Filled 2016-02-23: qty 2

## 2016-02-23 MED ORDER — LEUCOVORIN CALCIUM INJECTION 350 MG
400.0000 mg/m2 | Freq: Once | INTRAVENOUS | Status: AC
  Administered 2016-02-23: 676 mg via INTRAVENOUS
  Filled 2016-02-23: qty 33.8

## 2016-02-23 MED ORDER — PROCHLORPERAZINE MALEATE 10 MG PO TABS
10.0000 mg | ORAL_TABLET | Freq: Once | ORAL | Status: AC
  Administered 2016-02-23: 10 mg via ORAL

## 2016-02-23 MED ORDER — SODIUM CHLORIDE 0.9 % IV SOLN
5.0000 mg/kg | Freq: Once | INTRAVENOUS | Status: AC
  Administered 2016-02-23: 325 mg via INTRAVENOUS
  Filled 2016-02-23: qty 13

## 2016-02-23 MED ORDER — DIPHENOXYLATE-ATROPINE 2.5-0.025 MG PO TABS: 1.0000 | ORAL_TABLET | Freq: Four times a day (QID) | ORAL | 2 refills | Status: DC | PRN

## 2016-02-23 MED ORDER — SODIUM CHLORIDE 0.9% FLUSH
10.0000 mL | INTRAVENOUS | Status: DC | PRN
  Administered 2016-02-23: 10 mL via INTRAVENOUS
  Filled 2016-02-23: qty 10

## 2016-02-23 MED ORDER — PROCHLORPERAZINE MALEATE 10 MG PO TABS
ORAL_TABLET | ORAL | Status: AC
  Filled 2016-02-23: qty 1

## 2016-02-23 MED ORDER — SODIUM CHLORIDE 0.9 % IV SOLN
Freq: Once | INTRAVENOUS | Status: AC
  Administered 2016-02-23: 14:00:00 via INTRAVENOUS

## 2016-02-23 MED ORDER — SODIUM CHLORIDE 0.9 % IV SOLN
2400.0000 mg/m2 | INTRAVENOUS | Status: DC
  Administered 2016-02-23: 4050 mg via INTRAVENOUS
  Filled 2016-02-23: qty 81

## 2016-02-23 NOTE — Patient Instructions (Signed)
[facility] Discharge Instructions for Patients Receiving Chemotherapy  Today you received the following chemotherapy agents:  Avastin, leucovorin, Fluorouracil  To help prevent nausea and vomiting after your treatment, we encourage you to take your nausea medication as prescribed.   If you develop nausea and vomiting that is not controlled by your nausea medication, call the clinic.   BELOW ARE SYMPTOMS THAT SHOULD BE REPORTED IMMEDIATELY:  *FEVER GREATER THAN 100.5 F  *CHILLS WITH OR WITHOUT FEVER  NAUSEA AND VOMITING THAT IS NOT CONTROLLED WITH YOUR NAUSEA MEDICATION  *UNUSUAL SHORTNESS OF BREATH  *UNUSUAL BRUISING OR BLEEDING  TENDERNESS IN MOUTH AND THROAT WITH OR WITHOUT PRESENCE OF ULCERS  *URINARY PROBLEMS  *BOWEL PROBLEMS  UNUSUAL RASH Items with * indicate a potential emergency and should be followed up as soon as possible.  Feel free to call the clinic you have any questions or concerns. The clinic phone number is [phone].  Please show the [facility] at check-in to the Emergency Department and triage nurse.  Fluorouracil, 5-FU injection What is this medicine? FLUOROURACIL, 5-FU (flure oh YOOR a sil) is a chemotherapy drug. It slows the growth of cancer cells. This medicine is used to treat many types of cancer like breast cancer, colon or rectal cancer, pancreatic cancer, and stomach cancer. This medicine may be used for other purposes; ask your health care provider or pharmacist if you have questions. COMMON BRAND NAME(S): Adrucil What should I tell my health care provider before I take this medicine? They need to know if you have any of these conditions: -blood disorders -dihydropyrimidine dehydrogenase (DPD) deficiency -infection (especially a virus infection such as chickenpox, cold sores, or herpes) -kidney disease -liver disease -malnourished, poor nutrition -recent or ongoing radiation therapy -an unusual or allergic  reaction to fluorouracil, other chemotherapy, other medicines, foods, dyes, or preservatives -pregnant or trying to get pregnant -breast-feeding How should I use this medicine? This drug is given as an infusion or injection into a vein. It is administered in a hospital or clinic by a specially trained health care professional. Talk to your pediatrician regarding the use of this medicine in children. Special care may be needed. Overdosage: If you think you have taken too much of this medicine contact a poison control center or emergency room at once. NOTE: This medicine is only for you. Do not share this medicine with others. What if I miss a dose? It is important not to miss your dose. Call your doctor or health care professional if you are unable to keep an appointment. What may interact with this medicine? -allopurinol -cimetidine -dapsone -digoxin -hydroxyurea -leucovorin -levamisole -medicines for seizures like ethotoin, fosphenytoin, phenytoin -medicines to increase blood counts like filgrastim, pegfilgrastim, sargramostim -medicines that treat or prevent blood clots like warfarin, enoxaparin, and dalteparin -methotrexate -metronidazole -pyrimethamine -some other chemotherapy drugs like busulfan, cisplatin, estramustine, vinblastine -trimethoprim -trimetrexate -vaccines Talk to your doctor or health care professional before taking any of these medicines: -acetaminophen -aspirin -ibuprofen -ketoprofen -naproxen This list may not describe all possible interactions. Give your health care provider a list of all the medicines, herbs, non-prescription drugs, or dietary supplements you use. Also tell them if you smoke, drink alcohol, or use illegal drugs. Some items may interact with your medicine. What should I watch for while using this medicine? Visit your doctor for checks on your progress. This drug may make you feel generally unwell. This is not uncommon, as chemotherapy can  affect healthy cells as well  as cancer cells. Report any side effects. Continue your course of treatment even though you feel ill unless your doctor tells you to stop. In some cases, you may be given additional medicines to help with side effects. Follow all directions for their use. Call your doctor or health care professional for advice if you get a fever, chills or sore throat, or other symptoms of a cold or flu. Do not treat yourself. This drug decreases your body's ability to fight infections. Try to avoid being around people who are sick. This medicine may increase your risk to bruise or bleed. Call your doctor or health care professional if you notice any unusual bleeding. Be careful brushing and flossing your teeth or using a toothpick because you may get an infection or bleed more easily. If you have any dental work done, tell your dentist you are receiving this medicine. Avoid taking products that contain aspirin, acetaminophen, ibuprofen, naproxen, or ketoprofen unless instructed by your doctor. These medicines may hide a fever. Do not become pregnant while taking this medicine. Women should inform their doctor if they wish to become pregnant or think they might be pregnant. There is a potential for serious side effects to an unborn child. Talk to your health care professional or pharmacist for more information. Do not breast-feed an infant while taking this medicine. Men should inform their doctor if they wish to father a child. This medicine may lower sperm counts. Do not treat diarrhea with over the counter products. Contact your doctor if you have diarrhea that lasts more than 2 days or if it is severe and watery. This medicine can make you more sensitive to the sun. Keep out of the sun. If you cannot avoid being in the sun, wear protective clothing and use sunscreen. Do not use sun lamps or tanning beds/booths. What side effects may I notice from receiving this medicine? Side effects that  you should report to your doctor or health care professional as soon as possible: -allergic reactions like skin rash, itching or hives, swelling of the face, lips, or tongue -low blood counts - this medicine may decrease the number of white blood cells, red blood cells and platelets. You may be at increased risk for infections and bleeding. -signs of infection - fever or chills, cough, sore throat, pain or difficulty passing urine -signs of decreased platelets or bleeding - bruising, pinpoint red spots on the skin, black, tarry stools, blood in the urine -signs of decreased red blood cells - unusually weak or tired, fainting spells, lightheadedness -breathing problems -changes in vision -chest pain -mouth sores -nausea and vomiting -pain, swelling, redness at site where injected -pain, tingling, numbness in the hands or feet -redness, swelling, or sores on hands or feet -stomach pain -unusual bleeding Side effects that usually do not require medical attention (report to your doctor or health care professional if they continue or are bothersome): -changes in finger or toe nails -diarrhea -dry or itchy skin -hair loss -headache -loss of appetite -sensitivity of eyes to the light -stomach upset -unusually teary eyes This list may not describe all possible side effects. Call your doctor for medical advice about side effects. You may report side effects to FDA at 1-800-FDA-1088. Where should I keep my medicine? This drug is given in a hospital or clinic and will not be stored at home. NOTE: This sheet is a summary. It may not cover all possible information. If you have questions about this medicine, talk to your doctor, pharmacist,  or health care provider.  2017 [employer] Standard (2007-06-04 13:53:16) Leucovorin injection What is this medicine? LEUCOVORIN (loo koe VOR in) is used to prevent or treat the harmful effects of some medicines. This medicine is used to treat anemia caused  by a low amount of folic acid in the body. It is also used with 5-fluorouracil (5-FU) to treat colon cancer. This medicine may be used for other purposes; ask your health care provider or pharmacist if you have questions. What should I tell my health care provider before I take this medicine? They need to know if you have any of these conditions: -anemia from low levels of vitamin B-12 in the blood -an unusual or allergic reaction to leucovorin, folic acid, other medicines, foods, dyes, or preservatives -pregnant or trying to get pregnant -breast-feeding How should I use this medicine? This medicine is for injection into a muscle or into a vein. It is given by a health care professional in a hospital or clinic setting. Talk to your pediatrician regarding the use of this medicine in children. Special care may be needed. Overdosage: If you think you have taken too much of this medicine contact a poison control center or emergency room at once. NOTE: This medicine is only for you. Do not share this medicine with others. What if I miss a dose? This does not apply. What may interact with this medicine? -capecitabine -fluorouracil -phenobarbital -phenytoin -primidone -trimethoprim-sulfamethoxazole This list may not describe all possible interactions. Give your health care provider a list of all the medicines, herbs, non-prescription drugs, or dietary supplements you use. Also tell them if you smoke, drink alcohol, or use illegal drugs. Some items may interact with your medicine. What should I watch for while using this medicine? Your condition will be monitored carefully while you are receiving this medicine. This medicine may increase the side effects of 5-fluorouracil, 5-FU. Tell your doctor or health care professional if you have diarrhea or mouth sores that do not get better or that get worse. What side effects may I notice from receiving this medicine? Side effects that you should report to  your doctor or health care professional as soon as possible: -allergic reactions like skin rash, itching or hives, swelling of the face, lips, or tongue -breathing problems -fever, infection -mouth sores -unusual bleeding or bruising -unusually weak or tired Side effects that usually do not require medical attention (report to your doctor or health care professional if they continue or are bothersome): -constipation or diarrhea -loss of appetite -nausea, vomiting This list may not describe all possible side effects. Call your doctor for medical advice about side effects. You may report side effects to FDA at 1-800-FDA-1088. Where should I keep my medicine? This drug is given in a hospital or clinic and will not be stored at home. NOTE: This sheet is a summary. It may not cover all possible information. If you have questions about this medicine, talk to your doctor, pharmacist, or health care provider.  2017 Else[employer]/Gold Standard (2007-08-05 16:50:29) Bevacizumab injection What is this medicine? BEVACIZUMAB (be va SIZ yoo mab) is a monoclonal antibody. It is used to treat cervical cancer, colorectal cancer, glioblastoma multiforme, non-small cell lung cancer (NSCLC), ovarian cancer, and renal cell cancer. This medicine may be used for other purposes; ask your health care provider or pharmacist if you have questions. COMMON BRAND NAME(S): Avastin What should I tell my health care provider before I take this medicine? They need to know if you have any of  these conditions: -blood clots -heart disease, including heart failure, heart attack, or chest pain (angina) -high blood pressure -infection (especially a virus infection such as chickenpox, cold sores, or herpes) -kidney disease -lung disease -prior chemotherapy with doxorubicin, daunorubicin, epirubicin, or other anthracycline type chemotherapy agents -recent or ongoing radiation therapy -recent surgery -stroke -an unusual or  allergic reaction to bevacizumab, hamster proteins, mouse proteins, other medicines, foods, dyes, or preservatives -pregnant or trying to get pregnant -breast-feeding How should I use this medicine? This medicine is for infusion into a vein. It is given by a health care professional in a hospital or clinic setting. Talk to your pediatrician regarding the use of this medicine in children. Special care may be needed. Overdosage: If you think you have taken too much of this medicine contact a poison control center or emergency room at once. NOTE: This medicine is only for you. Do not share this medicine with others. What if I miss a dose? It is important not to miss your dose. Call your doctor or health care professional if you are unable to keep an appointment. What may interact with this medicine? Interactions are not expected. This list may not describe all possible interactions. Give your health care provider a list of all the medicines, herbs, non-prescription drugs, or dietary supplements you use. Also tell them if you smoke, drink alcohol, or use illegal drugs. Some items may interact with your medicine. What should I watch for while using this medicine? Your condition will be monitored carefully while you are receiving this medicine. You will need important blood work and urine testing done while you are taking this medicine. During your treatment, let your health care professional know if you have any unusual symptoms, such as difficulty breathing. This medicine may rarely cause 'gastrointestinal perforation' (holes in the stomach, intestines or colon), a serious side effect requiring surgery to repair. This medicine should be started at least 28 days following major surgery and the site of the surgery should be totally healed. Check with your doctor before scheduling dental work or surgery while you are receiving this treatment. Talk to your doctor if you have recently had surgery or if you  have a wound that has not healed. Do not become pregnant while taking this medicine or for 6 months after stopping it. Women should inform their doctor if they wish to become pregnant or think they might be pregnant. There is a potential for serious side effects to an unborn child. Talk to your health care professional or pharmacist for more information. Do not breast-feed an infant while taking this medicine. This medicine has caused ovarian failure in some women. This medicine may interfere with the ability to have a child. You should talk to your doctor or health care professional if you are concerned about your fertility. What side effects may I notice from receiving this medicine? Side effects that you should report to your doctor or health care professional as soon as possible: -allergic reactions like skin rash, itching or hives, swelling of the face, lips, or tongue -breathing problems -changes in vision -chest pain -confusion -jaw pain, especially after dental work -mouth sores -seizures -severe abdominal pain -severe headache -signs of decreased platelets or bleeding - bruising, pinpoint red spots on the skin, black, tarry stools, nosebleeds, blood in the urine -signs of infection - fever or chills, cough, sore throat, pain or trouble passing urine -sudden numbness or weakness of the face, arm or leg -swelling of legs or ankles -symptoms  of a stroke: change in mental awareness, inability to talk or move one side of the body (especially in patients with lung cancer) -trouble passing urine or change in the amount of urine -trouble speaking or understanding -trouble walking, dizziness, loss of balance or coordination Side effects that usually do not require medical attention (report to your doctor or health care professional if they continue or are bothersome): -constipation -diarrhea -dry skin -headache -loss of appetite -nausea, vomiting This list may not describe all possible  side effects. Call your doctor for medical advice about side effects. You may report side effects to FDA at 1-800-FDA-1088. Where should I keep my medicine? This drug is given in a hospital or clinic and will not be stored at home. NOTE: This sheet is a summary. It may not cover all possible information. If you have questions about this medicine, talk to your doctor, pharmacist, or health care provider.  2017 [employer]/Gold Standard (2015-01-21 15:28:53)

## 2016-02-23 NOTE — Progress Notes (Signed)
- [place]  [phone] Fax:([phone]) [phone]  Clinic Follow Up Note   Patient Care Team: Prince Solian, MD as PCP - General (Internal Medicine) Gaye Pollack, MD as Consulting Physician (Cardiothoracic Surgery) Gatha Mayer, MD as Consulting Physician (Gastroenterology) Leighton Ruff, MD as Consulting Physician (General Surgery) Tania Ade, RN as Registered Nurse (Medical Oncology) 02/23/2016  CHIEF COMPLAINTS:  Follow-up of colon cancer  Oncology History   Cancer of left colon [facility])   Staging form: Colon and Rectum, AJCC 7th Edition     Pathologic stage from 05/17/2014: Stage IIA (T3, N0, cM0) - Signed by Truitt Merle, MD on 02/21/2015 Cancer of right colon [facility])   Staging form: Colon and Rectum, AJCC 7th Edition     Pathologic stage from 12/08/2014: Stage IIIB (T4a, N1a, cM0) - Signed by Truitt Merle, MD on 02/21/2015       Cancer of right colon ([place])   04/09/2014 Procedure    Colonoscopy per Dr. Silvano Rusk: Large fleshy mass sigmoid colon 25 cm from anus. Could not pass through.      04/09/2014 Tumor Marker    CEA =11.7      04/12/2014 Imaging    CT C/A/P:sigmoid colon mass;several small areas of soft tissue nodularity in peritoneal cavity; small attenuation structure posterior right hepatic lobe; 2 stable nodules in lungs dating back to 2014. No acute findings.      05/17/2014 Initial Diagnosis    Colon cancer-presented with heme + stool and more frequent BMs      05/17/2014 Surgery    Robot assisted sigmoidectomy      05/17/2014 Pathologic Stage    pT3,pN0,pMX--Grade 1--0/13 nodes +--MMR normal      05/17/2014 Clinical Stage    Stage II      12/08/2014 Surgery    right hemicolectomy       12/08/2014 Pathology Results    right colon adenocarcinoma, pT4aN1c, 19 nodes were negative, LVI(+), MMR normal       01/17/2015 - 06/27/2015 Chemotherapy    adjuvant capecitabine 1535m q12h, 2 weeks on and 1 week off. dose reduction and stopped  after 6 cycles due to poor tolerance,       08/15/2015 Progression    CT abdomen showed peritoneal nodules, highly suspicious for recurrent metastasis      10/10/2015 Imaging    PET scan showed multiple enlarging, hypermetabolic peritoneal nodules consistent with peritoneal carcinomatosis. No other evidence of metastasis      11/04/2015 Pathology Results    Diagnosis 11/04/2015 Peritoneum, biopsy, RLQ - METASTATIC COLORECTAL ADENOCARCINOMA. - SEE COMMENT. -[facility]      02/02/2016 Imaging    CT CAP W CONTRAST 02/02/2016 IMPRESSION: 1. Extensive worsening of peritoneal spread of tumor with numerous large new tumor nodules scattered throughout The upper omentum and also scattered within along the mesentery. Mild extension into the right retroperitoneum at the level of the iliac crest. 2. Slight reduction in size of the right upper lobe pulmonary nodule 1.1 cm, previously 1.2 cm. Stable indistinct ground-glass density nodule in the right upper lobe. 3. Airway thickening is present, suggesting bronchitis or reactive airways disease. 4. Ascending aortic aneurysm 4.0 cm in diameter. Recommend annual imaging followup by CTA or MRA. This recommendation follows 2010 ACCF/AHA/AATS/ACR/ASA/SCA/SCAI/SIR/STS/SVM Guidelines for the Diagnosis and Management of Patients with Thoracic Aortic Disease. Circulation. 2010; 121: e266-e369 5. Cholelithiasis.      02/08/2016 -  Chemotherapy    5-fu infusion and Avastin every 2 weeks  HISTORY OF PRESENTING ILLNESS:  Heidi Marquez 83 y.o. female is here because of recently diagnosed a stage II sigmoid colon cancer.  She had annual physical with her PCP in early Feb this year, and stool OB (+), no overt GI bleeding, CBC was normal. She had noticed some loose BM before that, but no nausea, loss of appetite, weight loss or other symoptoms. Colonoscopy showed a large mass in the sigmoid colon 25 cm from anus. Biopsy was taken which  showed tubulovillous adenoma, no malignancy. She was referred to surgeon Dr. Marcello Moores and underwent robotic-assisted sigmoidectomy on 4/4. She tolerated the procedure well, and was discharged home afterwards.  She has recovered well from the surgery 3 weeks ago, she has mild fatigue, but otherwise doing very well without other complains. She had 2-3 episodes of bleeding after the surgery, which was related to her herromods. She is eating well. 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   She has chronic knee pain she takes ibuprofen once daily.    CURRENT THERAPY: chemotherapy with 5-Fu and Avastin every 2 weeks, started on 02/08/2016  INTERIM HISTORY: Mrs. Junio returns for follow-up and second cycle chemo. She tolerated the first cycle chemo well, but developed diarrhea one week ago, and rectal bleeding the next day. She described was mixed with stool, small to moderate amount, no significant blood clots. She had bloody bowel movement 4-6 times a day for 4-5 days, and finally resolved yesterday. She did take Imodium, a total of about 20 tablets. She had mild abdominal cramps before bowel movement, no nausea, abdominal bloating, fever, or other discomfort. She has been eating well overall, mild to moderate fatigue, able to function at home.  MEDICAL HISTORY:  Past Medical History:  Diagnosis Date  . Adenocarcinoma of colon [facility]) oncologist-  dr Truitt Merle---  last chemo 01-17-2015 to 06-27-2015)   dx 05-17-2014 sigmoid (left) cancer, Stage 2, G1 (pT3N0M0), MSI stable -- s/p  sigmoidectomy /  dx 12-08-2014 ascending colon cancer (right) , Stage 3B, G2, (pT4aN1cM0), MSI stable -- s/p  right hemicolectomy/  08-2015  recurrent peritoneal carcinomatosis  . Arthritis    knees  . Coronary atherosclerosis   . Diverticulosis of colon   . GERD (gastroesophageal reflux disease)   . HH (hiatus hernia)   . History of basal cell carcinoma excision    right side  nose 2013  s/p  moh's  . History of epistaxis    2005  caurterized twice  . History of esophageal dilatation    2009 for stricture  . History of esophagitis    reflux  . Internal hemorrhoids   . Macular degeneration of both eyes   . Multinodular thyroid    remote hx benign bx 2005  . Peritoneal carcinomatosis ([place])    secondary to primary colon cancer  . Pulmonary nodule, right    noted since 2014-- stable per last ct 06-15-2015  . Thoracic ascending aortic aneurysm [facility])    followed by dr Cyndia Bent ([facility])  last ct 06-15-2015  4.3cm    SURGICAL HISTORY: Past Surgical History:  Procedure Laterality Date  . COLONOSCOPY WITH PROPOFOL N/A 09/30/2014   Procedure: COLONOSCOPY WITH PROPOFOL;  Surgeon: Milus Banister, MD;  Location: [facility];  Service: Endoscopy;  Laterality: N/A;  . ESOPHAGOGASTRODUODENOSCOPY  2009  . INGUINAL HERNIA REPAIR Right early 1990s  . MOHS SURGERY  11/2011   nose  . PORTACATH PLACEMENT Right 01/24/2016   Procedure:  INSERTION PORT-A-CATH;  Surgeon: Leighton Ruff, MD;  Location: [facility];  Service: General;  Laterality: Right;  . ROBOTIC ASSISTED RIGHT HEMICOLECTOMY  65/46/5035    dr Leighton Ruff  . ROBOTIC ASSISTED SIGMOIDECTOMY  46/56/8127   dr Leighton Ruff  . UPPER GASTROINTESTINAL ENDOSCOPY    . VAGINAL HYSTERECTOMY  age 58   partial with the bladder tack    SOCIAL HISTORY: Social History   Social History  . Marital status: Married    Spouse name: N/A  . Number of children: N/A  . Years of education: N/A   Occupational History  . Not on file.   Social History Main Topics  . Smoking status: Never Smoker  . Smokeless tobacco: Never Used  . Alcohol use No  . Drug use: No  . Sexual activity: Not on file   Other Topics Concern  . Not on file   Social History Narrative   Married, husband is disabled (physical and dementia) and she is caregiver   Has #1 child (daughter) living. Son died of MI at age 58   Worked at  [employer] before she retired   Has #3 cats    FAMILY HISTORY: Family History  Problem Relation Age of Onset  . Colon cancer Paternal Grandfather 88    colon cancer   . Stroke Father   . Heart attack Son 10    died of heart attack   . Esophageal cancer Neg Hx   . Rectal cancer Neg Hx   . Stomach cancer Neg Hx     ALLERGIES:  is allergic to sulfa antibiotics; amoxicillin-pot clavulanate; and penicillins.  MEDICATIONS:  Current Outpatient Prescriptions  Medication Sig Dispense Refill  . alendronate (FOSAMAX) 70 MG tablet Take 70 mg by mouth every 7 (seven) days. Sundays. Take with a full glass of water on an empty stomach.    . Ascorbic Acid (VITAMIN C) 1000 MG tablet Take 1,000 mg by mouth daily.    . Biotin 1000 MCG tablet Take 1,000 mcg by mouth daily.     Marland Kitchen CALCIUM & MAGNESIUM CARBONATES PO Take 1 tablet by mouth daily.    . cholecalciferol (VITAMIN D) 1000 UNITS tablet Take 1,000 Units by mouth daily.    . diphenoxylate-atropine (LOMOTIL) 2.5-0.025 MG tablet Take 1-2 tablets by mouth 4 (four) times daily as needed for diarrhea or loose stools. 30 tablet 2  . glucosamine-chondroitin 500-400 MG tablet Take 1 tablet by mouth daily.     . Homeopathic Products ([employer] LEG CRAMPS PAIN RELIEF PO) Take 1 tablet by mouth daily as needed (for leg cramps).    Marland Kitchen ibuprofen (ADVIL,MOTRIN) 200 MG tablet Take 200 mg by mouth daily as needed for moderate pain.     Marland Kitchen lidocaine-prilocaine (EMLA) cream Apply to affected area once 30 g 3  . loratadine (CLARITIN) 10 MG tablet Take 10 mg by mouth daily.    . Multiple Vitamin (MULTIVITAMIN) tablet Take 1 tablet by mouth daily.    . ondansetron (ZOFRAN) 8 MG tablet Take 1 tablet (8 mg total) by mouth every 8 (eight) hours as needed (Nausea or vomiting). 30 tablet 1  . potassium chloride SA (K-DUR,KLOR-CON) 20 MEQ tablet Take 1 tablet (20 mEq total) by mouth daily. 20 tablet 1  . prochlorperazine (COMPAZINE) 10 MG tablet Take 1 tablet (10 mg total)  by mouth every 8 (eight) hours as needed for nausea (Nausea or vomiting). 30 tablet 0  . ranitidine (ZANTAC) 150 MG tablet Take 150 mg  by mouth as needed for heartburn.    . urea (CARMOL) 10 % cream Apply topically 2 (two) times daily. Apply to hand twice daily 142 g 0  . vitamin B-12 (CYANOCOBALAMIN) 1000 MCG tablet Take 1,000 mcg by mouth daily.      No current facility-administered medications for this visit.     REVIEW OF SYSTEMS:   Constitutional: Denies fevers, chills or abnormal night sweats Eyes: Denies blurriness of vision, double vision or watery eyes Ears, nose, mouth, throat, and face: Denies mucositis or sore throat Respiratory: Denies cough, dyspnea or wheezes Cardiovascular: Denies palpitation, chest discomfort or lower extremity swelling Gastrointestinal:  Denies nausea, heartburn or change in bowel habits Skin: Denies abnormal skin rashes Lymphatics: Denies new lymphadenopathy or easy bruising Neurological:Denies numbness, tingling or new weaknesses Behavioral/Psych: Mood is stable, no new changes  All other systems were reviewed with the patient and are negative.   PHYSICAL EXAMINATION: ECOG PERFORMANCE STATUS: 1  Vitals:   02/23/16 1243  BP: (!) 152/67  Pulse: 65  Resp: 18  Temp: 98.4 F (36.9 C)   Filed Weights   02/23/16 1243  Weight: 140 lb 8 oz (63.7 kg)    GENERAL:alert, no distress and comfortable SKIN: skin color, texture, turgor are normal, no rashes or significant lesions  EYES: normal, conjunctiva are pink and non-injected, sclera clear OROPHARYNX:no exudate, no erythema and lips, buccal mucosa, and tongue normal  NECK: supple, thyroid normal size, non-tender, without nodularity LYMPH:  no palpable lymphadenopathy in the cervical, axillary or inguinal LUNGS: clear to auscultation and percussion with normal breathing effort HEART: regular rate & rhythm and no murmurs and no lower extremity edema ABDOMEN:abdomen soft, surgical incision and to  the low abdomen is well healed. non-tender and normal bowel sounds, rectal exam showed internal and external hemorrhoids, no bleeding, stool was green. Musculoskeletal:no cyanosis of digits and no clubbing  PSYCH: alert & oriented x 3 with fluent speech NEURO: no focal motor/sensory deficits   LABORATORY DATA:  I have reviewed the data as listed CBC Latest Ref Rng & Units 02/23/2016 02/08/2016 01/12/2016  WBC 3.9 - 10.3 10e3/uL 5.3 5.9 7.1  Hemoglobin 11.6 - 15.9 g/dL 10.4(L) 11.6 13.4  Hematocrit 34.8 - 46.6 % 30.6(L) 35.0 41.3  Platelets 145 - 400 10e3/uL 143(L) 147 159   CMP Latest Ref Rng & Units 02/23/2016 02/08/2016 01/12/2016  Glucose 70 - 140 mg/dl 97 113 124  BUN 7.0 - 26.0 mg/dL 22.4 20.9 21.6  Creatinine 0.6 - 1.1 mg/dL 0.9 0.8 0.9  Sodium 136 - 145 mEq/L 143 144 142  Potassium 3.5 - 5.1 mEq/L 3.9 3.9 4.3  Chloride 101 - 111 mmol/L - - -  CO2 22 - 29 mEq/L _0 Calcium 8.4 - 10.4 mg/dL 8.8 8.9 10.2  Total Protein 6.4 - 8.3 g/dL 6.0(L) 6.5 7.4  Total Bilirubin 0.20 - 1.20 mg/dL 0.48 0.76 0.90  Alkaline Phos 40 - 150 U/L 80 101 112  AST 5 - 34 U/L _1 ALT 0 - 55 U/L _2 CEA 05/02/2015: 2.6 08/15/2015: 9.6 12/16/2015: 190 (253 in house)     Pathology report: Diagnosis 05/17/2014 1. Colon, segmental resection for tumor, sigmoid, open end proximal - INVASIVE ADENOCARCINOMA WITH EXTRACELLULAR MUCIN, INVADING THROUGH THE MUSCULARIS PROPRIA INTO PERICOLONIC FATTY TISSUE. - THIRTEEN LYMPH NODES, NEGATIVE FOR METASTATIC CARCINOMA (0/13). - MULTIPLE DIVERTICULA. - RESECTION MARGINS, NEGATIVE FOR ATYPIA OR MALIGNANCY. - PLEASE SEE ONCOLOGY TEMPLATE FOR DETAILS. 2. Colon, resection  margin (donut), sigmoid - BENIGN COLONIC MUCOSA, NO EVIDENCE OF DYSPLASIA OR MALIGNANCY. Specimen: Left colon. Procedure: Segmental resection. Tumor site: Sigmoid colon. Specimen integrity: Intact. Macroscopic intactness of mesorectum: N/A Macroscopic tumor perforation:  No Invasive tumor: Invasive adenocarcinoma with extracellular mucin. Maximum size: 8.0 cm, gross measurement. Histologic type(s): Invasive adenocarcinoma with extracellular mucin. Histologic grade and differentiation: G1: well differentiated/low grade Type of polyp in which invasive carcinoma arose: Villous adenoma. Microscopic extension of invasive tumor: Invading through the muscularis propria into pericolonic fatty tissue. Lymph-Vascular invasion: Not identified. Peri-neural invasion: Not identified. Tumor deposit(s) (discontinuous extramural extension): None. Resection margins: Negative. Proximal margin: 9.6 cm. Distal margin: 9.6 cm. Mesenteric margin (sigmoid and transverse): 6 cm. Treatment effect (neo-adjuvant therapy): No Additional polyp(s): N/A Non-neoplastic findings: Prominent Crohn-like reaction around the tumor. Lymph nodes: number examined 13; number positive: 0 Pathologic Staging: pT3, pN0, pMX  Diagnosis 12/08/2014  Colon, segmental resection for tumor, right colon MODERATELY DIFFERENTIATED ADENOCARCINOMA OF THE COLON (3.8 CM) THE TUMOR PENETRATES TO THE SURFACE OF THE VISCERAL PERITONEUM (T4A) LYMPHOVASCULAR INVASION IS IDENTIFIED MARGINS OF RESECTION ARE NEGATIVE FOR TUMOR NINTEEN BENIGN LYMPH NODE (0/19) ONE TUMOR DEPOSITE IS IDENTIFIED ([place]) Specimen: Colon Procedure: Segmental resection Tumor site: Ascending colon Specimen integrity: Intact Macroscopic intactness of mesorectum: Not applicable: Macroscopic tumor perforation: The tumor invades through the muscularis propria penatrate the surface of serosa Invasive tumor: Maximum size: 3.8 Histologic type(s): Adenocarcinoma G2 G2: moderately differentiated/low grade Type of polyp in which invasive carcinoma arose: Tubular adenoma Microscopic extension of invasive tumor: Serosa Lymph-Vascular invasion: Present Peri-neural invasion: Negative Tumor deposit(s) (discontinuous extramural extension):  Present Resection margins: Proximal margin: 12 cm Distal margin: 23 cm Circumferential (radial) (posterior ascending, posterior descending; lateral and posterior mid-rectum; and entire lower 1/3 rectum):3.5 cm Mesenteric margin (sigmoid and transverse): NA Distance closest margin (if all above margins negative): _ Treatment effect (neo-adjuvant therapy): Negative Additional polyp(s): Negative Non-neoplastic findings: Unremarkabel Lymph nodes: number examined 19; number positive: 0 Pathologic Staging: T4a, N1c, M_  MMR: NORMAL MSI: STABLE  Mismatch Repair (MMR) Protein Immunohistochemistry (IHC) IHC Expression Result: MLH1: Preserved nuclear expression (greater 50% tumor expression) MSH2: Preserved nuclear expression (greater 50% tumor expression) MSH6: Preserved nuclear expression (greater 50% tumor expression) PMS2: Preserved nuclear expression (greater 50% tumor expression) * Internal control demonstrates intact nuclear expression Interpretation: NORMAL   RADIOGRAPHIC STUDIES: I have personally reviewed the radiological images as listed and agreed with the findings in the report.  PET 10/10/2015 IMPRESSION: 1. Multiple enlarging, hypermetabolic peritoneal nodules consistent with peritoneal carcinomatosis. No generalized ascites. 2. No evidence of metastatic disease to the liver. 3. No suspicious pulmonary findings. Grossly stable chronic postinflammatory changes. 4. Multinodular goiter.  Diagnosis 11/04/2015 Peritoneum, biopsy, RLQ - METASTATIC COLORECTAL ADENOCARCINOMA. - SEE COMMENT. Microscopic Comment Dr. Burr Medico was paged on 11/07/2015. Per request, a block will be sent to [facility] for additional testing and the results reported separately. (JBK:kh 11/07/2015)  ASSESSMENT & PLAN:  83 y.o. Caucasian female, with past medical history of colon diverticulosis and arthritis, but otherwise healthy and active, who was found to have a large sigmoid tumor, status post  sigmoidectomy.  1. Sigmoid colon adenocarcinoma, pT3N0M0, stage II, G1, MSI stable, ascending colon adenocarcinoma, pT4aN1cM0, stage IIIB, MSI stable, G2, recurrent peritoneal carcinomatosis in 08/2015 -She had multifocal (2) colon cancer status post complete surgical resection,  -I reviewed her recent surgical pathology results in great details with her. The right colon cancer has several high-risk features, including stage IIIB, T4a lesion, tumor deposits, lymphovascular invasion,  her risk of colon cancer recurrence is high.  -She tried adjuvant chemotherapy Xeloda, but could not tolerate full dose, and last cycle chemotherapy was canceled due to her tolerance issue. -I reviewed her restaging CT scan from 08/2015, which showed a 2 new peritoneal nodule, suspicious for peritoneal carcinomatosis. She is not symptomatic now. -Her to follow-up her CEA has also increased lately, concerning for cancer recurrence. -I reviewed her recent PET scan from 10/10/2015, which unfortunately showed multiple enlarging, hypermetabolic peritoneal nodules consistent with peritoneal carcinomatosis. No ascites or other metastasis. -I discussed her recent peritoneal mass biopsy, which confirmed metastatic colon cancer  -We discussed that her cancer is not curable at this stage, she is not a candidate for debulking and HIPEC, and her goal of care is palliation.  -Her [facility] genomic testing results were discussed with the patient. She has (+) KRAS mutation, not a candidate for EGFR inhibitor   -Her tumor has MSI stable, not a good candidate for immunotherapy alone  -She has started first-line palliative chemotherapy with 5-FU infusion and a fasting, tolerate treatment well, but developed rectal bleeding and diarrhea 1 week after, resolved now. -She has recovered well, lab reviewed, adequate for treatment, we'll proceed with second cycle 5-FU and Avastin  2. Rectal bleeding and diarrhea  -Her diarrhea is probably  related to chemotherapy, although it's little unusual that it happened 1 week after her chemotherapy -Bleeding likely secondary to hemorrhoids, possibly related to Avastin. -I encouraged her to use Imodium, I also given a prescription of Lomotil for diarrhea -She will call us and come to see [place] if she has recurrent rectal bleeding.  3. Peripheral neuropathy, G 1 -From previous chemotherapy Xeloda, mild and stable now.  4. GERD -she'll continue Protonix.  5.Lung nodules  -She has two small right lung nodules which were discovered on the CT scan in 09/2012,  RUL nodule slightly bigger on recent CT -She has been follow-up with Dr. Cyndia Bent.  -she never smoked, low risk for lung cancer -given the stability of the RUL nodule over 2 years, this is likely a benign lesion    Plan -Lab reviewed, we'll proceed to second cycle 5-FU and Avastin, no 5-FU bolus -I give her a prescription of Lomotil for diarrhea if needed -We'll see her back in 2 weeks with next cycle chemotherapy. -She knows to call us and come in to see [place] if she has recurrent rectal bleeding.  All questions were answered. The patient knows to call the clinic with any problems, questions or concerns.  I spent 20 minutes counseling the patient face to face. The total time spent in the appointment was 25  minutes and more than 50% was on counseling.   Truitt Merle  02/23/2016

## 2016-02-25 ENCOUNTER — Ambulatory Visit (HOSPITAL_BASED_OUTPATIENT_CLINIC_OR_DEPARTMENT_OTHER): Payer: PPO

## 2016-02-25 VITALS — BP 114/72 | HR 76 | Temp 97.5°F | Resp 18

## 2016-02-25 DIAGNOSIS — C182 Malignant neoplasm of ascending colon: Secondary | ICD-10-CM | POA: Diagnosis not present

## 2016-02-25 DIAGNOSIS — Z452 Encounter for adjustment and management of vascular access device: Secondary | ICD-10-CM

## 2016-02-25 DIAGNOSIS — C186 Malignant neoplasm of descending colon: Secondary | ICD-10-CM

## 2016-02-25 DIAGNOSIS — C187 Malignant neoplasm of sigmoid colon: Secondary | ICD-10-CM | POA: Diagnosis not present

## 2016-02-25 MED ORDER — SODIUM CHLORIDE 0.9% FLUSH
10.0000 mL | INTRAVENOUS | Status: DC | PRN
  Administered 2016-02-25: 10 mL
  Filled 2016-02-25: qty 10

## 2016-02-25 MED ORDER — HEPARIN SOD (PORK) LOCK FLUSH 100 UNIT/ML IV SOLN
500.0000 [IU] | Freq: Once | INTRAVENOUS | Status: AC | PRN
  Administered 2016-02-25: 500 [IU]
  Filled 2016-02-25: qty 5

## 2016-02-26 ENCOUNTER — Telehealth: Payer: Self-pay | Admitting: Hematology

## 2016-02-26 NOTE — Telephone Encounter (Signed)
S/w pt, gave appt for 1/25 @ 1.15. She will collect a calendar then.
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
## 2016-03-08 ENCOUNTER — Ambulatory Visit (HOSPITAL_BASED_OUTPATIENT_CLINIC_OR_DEPARTMENT_OTHER): Payer: PPO

## 2016-03-08 ENCOUNTER — Other Ambulatory Visit (HOSPITAL_BASED_OUTPATIENT_CLINIC_OR_DEPARTMENT_OTHER): Payer: PPO

## 2016-03-08 ENCOUNTER — Ambulatory Visit (HOSPITAL_BASED_OUTPATIENT_CLINIC_OR_DEPARTMENT_OTHER): Payer: PPO | Admitting: Hematology

## 2016-03-08 VITALS — BP 147/73 | HR 79

## 2016-03-08 VITALS — BP 168/83 | HR 65 | Temp 97.3°F | Resp 18 | Ht 63.0 in | Wt 138.6 lb

## 2016-03-08 DIAGNOSIS — C187 Malignant neoplasm of sigmoid colon: Secondary | ICD-10-CM

## 2016-03-08 DIAGNOSIS — R918 Other nonspecific abnormal finding of lung field: Secondary | ICD-10-CM

## 2016-03-08 DIAGNOSIS — C182 Malignant neoplasm of ascending colon: Secondary | ICD-10-CM

## 2016-03-08 DIAGNOSIS — C186 Malignant neoplasm of descending colon: Secondary | ICD-10-CM

## 2016-03-08 DIAGNOSIS — R197 Diarrhea, unspecified: Secondary | ICD-10-CM | POA: Diagnosis not present

## 2016-03-08 DIAGNOSIS — Z5111 Encounter for antineoplastic chemotherapy: Secondary | ICD-10-CM

## 2016-03-08 DIAGNOSIS — Z5112 Encounter for antineoplastic immunotherapy: Secondary | ICD-10-CM

## 2016-03-08 DIAGNOSIS — K625 Hemorrhage of anus and rectum: Secondary | ICD-10-CM

## 2016-03-08 DIAGNOSIS — G620 Drug-induced polyneuropathy: Secondary | ICD-10-CM

## 2016-03-08 DIAGNOSIS — Z95828 Presence of other vascular implants and grafts: Secondary | ICD-10-CM

## 2016-03-08 DIAGNOSIS — K219 Gastro-esophageal reflux disease without esophagitis: Secondary | ICD-10-CM

## 2016-03-08 LAB — COMPREHENSIVE METABOLIC PANEL
ALBUMIN: 4 g/dL (ref 3.5–5.0)
ALK PHOS: 87 U/L (ref 40–150)
ALT: 8 U/L (ref 0–55)
AST: 17 U/L (ref 5–34)
Anion Gap: 9 mEq/L (ref 3–11)
BUN: 16.9 mg/dL (ref 7.0–26.0)
CALCIUM: 9.5 mg/dL (ref 8.4–10.4)
CO2: 26 mEq/L (ref 22–29)
Chloride: 106 mEq/L (ref 98–109)
Creatinine: 0.8 mg/dL (ref 0.6–1.1)
EGFR: 66 mL/min/{1.73_m2} — AB (ref >90)
Glucose: 90 mg/dl (ref 70–140)
POTASSIUM: 4.3 meq/L (ref 3.5–5.1)
SODIUM: 141 meq/L (ref 136–145)
Total Bilirubin: 0.73 mg/dL (ref 0.20–1.20)
Total Protein: 6.9 g/dL (ref 6.4–8.3)

## 2016-03-08 LAB — CBC WITH DIFFERENTIAL/PLATELET
BASO%: 0.8 % (ref 0.0–2.0)
BASOS ABS: 0.1 10*3/uL (ref 0.0–0.1)
EOS ABS: 0.2 10*3/uL (ref 0.0–0.5)
EOS%: 2.8 % (ref 0.0–7.0)
HCT: 35 % (ref 34.8–46.6)
HEMOGLOBIN: 11.8 g/dL (ref 11.6–15.9)
LYMPH%: 15.7 % (ref 14.0–49.7)
MCH: 31.1 pg (ref 25.1–34.0)
MCHC: 33.7 g/dL (ref 31.5–36.0)
MCV: 92.1 fL (ref 79.5–101.0)
MONO#: 0.5 10*3/uL (ref 0.1–0.9)
MONO%: 7.5 % (ref 0.0–14.0)
NEUT#: 4.7 10*3/uL (ref 1.5–6.5)
NEUT%: 73.2 % (ref 38.4–76.8)
Platelets: 143 10*3/uL — ABNORMAL LOW (ref 145–400)
RBC: 3.8 10*6/uL (ref 3.70–5.45)
RDW: 14.9 % — AB (ref 11.2–14.5)
WBC: 6.4 10*3/uL (ref 3.9–10.3)
lymph#: 1 10*3/uL (ref 0.9–3.3)

## 2016-03-08 LAB — CEA (IN HOUSE-CHCC): CEA (CHCC-IN HOUSE): 462.92 ng/mL — AB (ref 0.00–5.00)

## 2016-03-08 MED ORDER — PROCHLORPERAZINE MALEATE 10 MG PO TABS
10.0000 mg | ORAL_TABLET | Freq: Once | ORAL | Status: AC
  Administered 2016-03-08: 10 mg via ORAL

## 2016-03-08 MED ORDER — SODIUM CHLORIDE 0.9% FLUSH
10.0000 mL | INTRAVENOUS | Status: DC | PRN
  Administered 2016-03-08: 10 mL via INTRAVENOUS
  Filled 2016-03-08: qty 10

## 2016-03-08 MED ORDER — LEUCOVORIN CALCIUM INJECTION 350 MG
400.0000 mg/m2 | Freq: Once | INTRAMUSCULAR | Status: AC
  Administered 2016-03-08: 676 mg via INTRAVENOUS
  Filled 2016-03-08: qty 33.8

## 2016-03-08 MED ORDER — SODIUM CHLORIDE 0.9 % IV SOLN
2400.0000 mg/m2 | INTRAVENOUS | Status: DC
  Administered 2016-03-08: 4050 mg via INTRAVENOUS
  Filled 2016-03-08: qty 81

## 2016-03-08 MED ORDER — PROCHLORPERAZINE MALEATE 10 MG PO TABS
ORAL_TABLET | ORAL | Status: AC
  Filled 2016-03-08: qty 1

## 2016-03-08 MED ORDER — SODIUM CHLORIDE 0.9 % IV SOLN
5.0000 mg/kg | Freq: Once | INTRAVENOUS | Status: AC
  Administered 2016-03-08: 325 mg via INTRAVENOUS
  Filled 2016-03-08: qty 13

## 2016-03-08 MED ORDER — SODIUM CHLORIDE 0.9 % IV SOLN
Freq: Once | INTRAVENOUS | Status: AC
  Administered 2016-03-08: 15:00:00 via INTRAVENOUS

## 2016-03-08 NOTE — Progress Notes (Signed)
OK to speed up CADDpump/5FU to infuse over 44 hours instead of 46 hr per Dr Burr Medico.  Order repeated &verified.

## 2016-03-08 NOTE — Progress Notes (Signed)
Okay to proceed with treatment with urine protein from 02/08/16 per pharmacy.

## 2016-03-08 NOTE — Patient Instructions (Signed)
[facility]charge Instructions for Patients Receiving Chemotherapy  Today you received the following chemotherapy agents:  Avastin, leucovorin, Fluorouracil  To help prevent nausea and vomiting after your treatment, we encourage you to take your nausea medication as prescribed.   If you develop nausea and vomiting that is not controlled by your nausea medication, call the clinic.   BELOW ARE SYMPTOMS THAT SHOULD BE REPORTED IMMEDIATELY:  *FEVER GREATER THAN 100.5 F  *CHILLS WITH OR WITHOUT FEVER  NAUSEA AND VOMITING THAT IS NOT CONTROLLED WITH YOUR NAUSEA MEDICATION  *UNUSUAL SHORTNESS OF BREATH  *UNUSUAL BRUISING OR BLEEDING  TENDERNESS IN MOUTH AND THROAT WITH OR WITHOUT PRESENCE OF ULCERS  *URINARY PROBLEMS  *BOWEL PROBLEMS  UNUSUAL RASH Items with * indicate a potential emergency and should be followed up as soon as possible.  Feel free to call the clinic you have any questions or concerns. The clinic phone number is [phone].  Please show the Icard at check-in to the Emergency Department and triage nurse.  Fluorouracil, 5-FU injection What is this medicine? FLUOROURACIL, 5-FU (flure oh YOOR a sil) is a chemotherapy drug. It slows the growth of cancer cells. This medicine is used to treat many types of cancer like breast cancer, colon or rectal cancer, pancreatic cancer, and stomach cancer. This medicine may be used for other purposes; ask your health care provider or pharmacist if you have questions. COMMON BRAND NAME(S): Adrucil What should I tell my health care provider before I take this medicine? They need to know if you have any of these conditions: -blood disorders -dihydropyrimidine dehydrogenase (DPD) deficiency -infection (especially a virus infection such as chickenpox, cold sores, or herpes) -kidney disease -liver disease -malnourished, poor nutrition -recent or ongoing radiation therapy -an unusual or allergic  reaction to fluorouracil, other chemotherapy, other medicines, foods, dyes, or preservatives -pregnant or trying to get pregnant -breast-feeding How should I use this medicine? This drug is given as an infusion or injection into a vein. It is administered in a hospital or clinic by a specially trained health care professional. Talk to your pediatrician regarding the use of this medicine in children. Special care may be needed. Overdosage: If you think you have taken too much of this medicine contact a poison control center or emergency room at once. NOTE: This medicine is only for you. Do not share this medicine with others. What if I miss a dose? It is important not to miss your dose. Call your doctor or health care professional if you are unable to keep an appointment. What may interact with this medicine? -allopurinol -cimetidine -dapsone -digoxin -hydroxyurea -leucovorin -levamisole -medicines for seizures like ethotoin, fosphenytoin, phenytoin -medicines to increase blood counts like filgrastim, pegfilgrastim, sargramostim -medicines that treat or prevent blood clots like warfarin, enoxaparin, and dalteparin -methotrexate -metronidazole -pyrimethamine -some other chemotherapy drugs like busulfan, cisplatin, estramustine, vinblastine -trimethoprim -trimetrexate -vaccines Talk to your doctor or health care professional before taking any of these medicines: -acetaminophen -aspirin -ibuprofen -ketoprofen -naproxen This list may not describe all possible interactions. Give your health care provider a list of all the medicines, herbs, non-prescription drugs, or dietary supplements you use. Also tell them if you smoke, drink alcohol, or use illegal drugs. Some items may interact with your medicine. What should I watch for while using this medicine? Visit your doctor for checks on your progress. This drug may make you feel generally unwell. This is not uncommon, as chemotherapy can  affect healthy cells as well  as cancer cells. Report any side effects. Continue your course of treatment even though you feel ill unless your doctor tells you to stop. In some cases, you may be given additional medicines to help with side effects. Follow all directions for their use. Call your doctor or health care professional for advice if you get a fever, chills or sore throat, or other symptoms of a cold or flu. Do not treat yourself. This drug decreases your body's ability to fight infections. Try to avoid being around people who are sick. This medicine may increase your risk to bruise or bleed. Call your doctor or health care professional if you notice any unusual bleeding. Be careful brushing and flossing your teeth or using a toothpick because you may get an infection or bleed more easily. If you have any dental work done, tell your dentist you are receiving this medicine. Avoid taking products that contain aspirin, acetaminophen, ibuprofen, naproxen, or ketoprofen unless instructed by your doctor. These medicines may hide a fever. Do not become pregnant while taking this medicine. Women should inform their doctor if they wish to become pregnant or think they might be pregnant. There is a potential for serious side effects to an unborn child. Talk to your health care professional or pharmacist for more information. Do not breast-feed an infant while taking this medicine. Men should inform their doctor if they wish to father a child. This medicine may lower sperm counts. Do not treat diarrhea with over the counter products. Contact your doctor if you have diarrhea that lasts more than 2 days or if it is severe and watery. This medicine can make you more sensitive to the sun. Keep out of the sun. If you cannot avoid being in the sun, wear protective clothing and use sunscreen. Do not use sun lamps or tanning beds/booths. What side effects may I notice from receiving this medicine? Side effects that  you should report to your doctor or health care professional as soon as possible: -allergic reactions like skin rash, itching or hives, swelling of the face, lips, or tongue -low blood counts - this medicine may decrease the number of white blood cells, red blood cells and platelets. You may be at increased risk for infections and bleeding. -signs of infection - fever or chills, cough, sore throat, pain or difficulty passing urine -signs of decreased platelets or bleeding - bruising, pinpoint red spots on the skin, black, tarry stools, blood in the urine -signs of decreased red blood cells - unusually weak or tired, fainting spells, lightheadedness -breathing problems -changes in vision -chest pain -mouth sores -nausea and vomiting -pain, swelling, redness at site where injected -pain, tingling, numbness in the hands or feet -redness, swelling, or sores on hands or feet -stomach pain -unusual bleeding Side effects that usually do not require medical attention (report to your doctor or health care professional if they continue or are bothersome): -changes in finger or toe nails -diarrhea -dry or itchy skin -hair loss -headache -loss of appetite -sensitivity of eyes to the light -stomach upset -unusually teary eyes This list may not describe all possible side effects. Call your doctor for medical advice about side effects. You may report side effects to FDA at 1-800-FDA-1088. Where should I keep my medicine? This drug is given in a hospital or clinic and will not be stored at home. NOTE: This sheet is a summary. It may not cover all possible information. If you have questions about this medicine, talk to your doctor, pharmacist,  or health care provider.  2017 [employer] Standard (2007-06-04 13:53:16) Leucovorin injection What is this medicine? LEUCOVORIN (loo koe VOR in) is used to prevent or treat the harmful effects of some medicines. This medicine is used to treat anemia caused  by a low amount of folic acid in the body. It is also used with 5-fluorouracil (5-FU) to treat colon cancer. This medicine may be used for other purposes; ask your health care provider or pharmacist if you have questions. What should I tell my health care provider before I take this medicine? They need to know if you have any of these conditions: -anemia from low levels of vitamin B-12 in the blood -an unusual or allergic reaction to leucovorin, folic acid, other medicines, foods, dyes, or preservatives -pregnant or trying to get pregnant -breast-feeding How should I use this medicine? This medicine is for injection into a muscle or into a vein. It is given by a health care professional in a hospital or clinic setting. Talk to your pediatrician regarding the use of this medicine in children. Special care may be needed. Overdosage: If you think you have taken too much of this medicine contact a poison control center or emergency room at once. NOTE: This medicine is only for you. Do not share this medicine with others. What if I miss a dose? This does not apply. What may interact with this medicine? -capecitabine -fluorouracil -phenobarbital -phenytoin -primidone -trimethoprim-sulfamethoxazole This list may not describe all possible interactions. Give your health care provider a list of all the medicines, herbs, non-prescription drugs, or dietary supplements you use. Also tell them if you smoke, drink alcohol, or use illegal drugs. Some items may interact with your medicine. What should I watch for while using this medicine? Your condition will be monitored carefully while you are receiving this medicine. This medicine may increase the side effects of 5-fluorouracil, 5-FU. Tell your doctor or health care professional if you have diarrhea or mouth sores that do not get better or that get worse. What side effects may I notice from receiving this medicine? Side effects that you should report to  your doctor or health care professional as soon as possible: -allergic reactions like skin rash, itching or hives, swelling of the face, lips, or tongue -breathing problems -fever, infection -mouth sores -unusual bleeding or bruising -unusually weak or tired Side effects that usually do not require medical attention (report to your doctor or health care professional if they continue or are bothersome): -constipation or diarrhea -loss of appetite -nausea, vomiting This list may not describe all possible side effects. Call your doctor for medical advice about side effects. You may report side effects to FDA at 1-800-FDA-1088. Where should I keep my medicine? This drug is given in a hospital or clinic and will not be stored at home. NOTE: This sheet is a summary. It may not cover all possible information. If you have questions about this medicine, talk to your doctor, pharmacist, or health care provider.  2017 [employer]/Gold Standard (2007-08-05 16:50:29) Bevacizumab injection What is this medicine? BEVACIZUMAB (be va SIZ yoo mab) is a monoclonal antibody. It is used to treat cervical cancer, colorectal cancer, glioblastoma multiforme, non-small cell lung cancer (NSCLC), ovarian cancer, and renal cell cancer. This medicine may be used for other purposes; ask your health care provider or pharmacist if you have questions. COMMON BRAND NAME(S): Avastin What should I tell my health care provider before I take this medicine? They need to know if you have any of  these conditions: -blood clots -heart disease, including heart failure, heart attack, or chest pain (angina) -high blood pressure -infection (especially a virus infection such as chickenpox, cold sores, or herpes) -kidney disease -lung disease -prior chemotherapy with doxorubicin, daunorubicin, epirubicin, or other anthracycline type chemotherapy agents -recent or ongoing radiation therapy -recent surgery -stroke -an unusual or  allergic reaction to bevacizumab, hamster proteins, mouse proteins, other medicines, foods, dyes, or preservatives -pregnant or trying to get pregnant -breast-feeding How should I use this medicine? This medicine is for infusion into a vein. It is given by a health care professional in a hospital or clinic setting. Talk to your pediatrician regarding the use of this medicine in children. Special care may be needed. Overdosage: If you think you have taken too much of this medicine contact a poison control center or emergency room at once. NOTE: This medicine is only for you. Do not share this medicine with others. What if I miss a dose? It is important not to miss your dose. Call your doctor or health care professional if you are unable to keep an appointment. What may interact with this medicine? Interactions are not expected. This list may not describe all possible interactions. Give your health care provider a list of all the medicines, herbs, non-prescription drugs, or dietary supplements you use. Also tell them if you smoke, drink alcohol, or use illegal drugs. Some items may interact with your medicine. What should I watch for while using this medicine? Your condition will be monitored carefully while you are receiving this medicine. You will need important blood work and urine testing done while you are taking this medicine. During your treatment, let your health care professional know if you have any unusual symptoms, such as difficulty breathing. This medicine may rarely cause 'gastrointestinal perforation' (holes in the stomach, intestines or colon), a serious side effect requiring surgery to repair. This medicine should be started at least 28 days following major surgery and the site of the surgery should be totally healed. Check with your doctor before scheduling dental work or surgery while you are receiving this treatment. Talk to your doctor if you have recently had surgery or if you  have a wound that has not healed. Do not become pregnant while taking this medicine or for 6 months after stopping it. Women should inform their doctor if they wish to become pregnant or think they might be pregnant. There is a potential for serious side effects to an unborn child. Talk to your health care professional or pharmacist for more information. Do not breast-feed an infant while taking this medicine. This medicine has caused ovarian failure in some women. This medicine may interfere with the ability to have a child. You should talk to your doctor or health care professional if you are concerned about your fertility. What side effects may I notice from receiving this medicine? Side effects that you should report to your doctor or health care professional as soon as possible: -allergic reactions like skin rash, itching or hives, swelling of the face, lips, or tongue -breathing problems -changes in vision -chest pain -confusion -jaw pain, especially after dental work -mouth sores -seizures -severe abdominal pain -severe headache -signs of decreased platelets or bleeding - bruising, pinpoint red spots on the skin, black, tarry stools, nosebleeds, blood in the urine -signs of infection - fever or chills, cough, sore throat, pain or trouble passing urine -sudden numbness or weakness of the face, arm or leg -swelling of legs or ankles -symptoms  of a stroke: change in mental awareness, inability to talk or move one side of the body (especially in patients with lung cancer) -trouble passing urine or change in the amount of urine -trouble speaking or understanding -trouble walking, dizziness, loss of balance or coordination Side effects that usually do not require medical attention (report to your doctor or health care professional if they continue or are bothersome): -constipation -diarrhea -dry skin -headache -loss of appetite -nausea, vomiting This list may not describe all possible  side effects. Call your doctor for medical advice about side effects. You may report side effects to FDA at 1-800-FDA-1088. Where should I keep my medicine? This drug is given in a hospital or clinic and will not be stored at home. NOTE: This sheet is a summary. It may not cover all possible information. If you have questions about this medicine, talk to your doctor, pharmacist, or health care provider.  2017 [employer] Standard (2015-01-21 15:28:53)

## 2016-03-09 ENCOUNTER — Telehealth: Payer: Self-pay | Admitting: *Deleted

## 2016-03-09 LAB — CEA: CEA: 324.8 ng/mL — ABNORMAL HIGH (ref 0.0–4.7)

## 2016-03-09 NOTE — Telephone Encounter (Signed)
Per 1/25 LOS and staff message I have scheduled appt and notified the scheduler

## 2016-03-10 ENCOUNTER — Ambulatory Visit (HOSPITAL_BASED_OUTPATIENT_CLINIC_OR_DEPARTMENT_OTHER): Payer: Self-pay

## 2016-03-10 ENCOUNTER — Encounter: Payer: Self-pay | Admitting: Hematology

## 2016-03-10 VITALS — BP 144/92 | HR 77 | Temp 98.0°F | Resp 18

## 2016-03-10 DIAGNOSIS — C182 Malignant neoplasm of ascending colon: Secondary | ICD-10-CM

## 2016-03-10 DIAGNOSIS — C186 Malignant neoplasm of descending colon: Secondary | ICD-10-CM

## 2016-03-10 MED ORDER — HEPARIN SOD (PORK) LOCK FLUSH 100 UNIT/ML IV SOLN
500.0000 [IU] | Freq: Once | INTRAVENOUS | Status: AC | PRN
  Administered 2016-03-10: 500 [IU]
  Filled 2016-03-10: qty 5

## 2016-03-10 MED ORDER — SODIUM CHLORIDE 0.9% FLUSH
10.0000 mL | INTRAVENOUS | Status: DC | PRN
Start: 2016-03-10 — End: 2016-03-10
  Administered 2016-03-10: 10 mL
  Filled 2016-03-10: qty 10

## 2016-03-20 NOTE — Progress Notes (Signed)
- [place]  [phone] [phone]  Clinic Follow Up Note   Patient Care Team: Prince Solian, MD as PCP - General (Internal Medicine) Gaye Pollack, MD as Consulting Physician (Cardiothoracic Surgery) Gatha Mayer, MD as Consulting Physician (Gastroenterology) Leighton Ruff, MD as Consulting Physician (General Surgery) Tania Ade, RN as Registered Nurse (Medical Oncology) 03/22/2016   CHIEF COMPLAINTS:  Follow-up of colon cancer  Oncology History   Cancer of left colon [facility])   Staging form: Colon and Rectum, AJCC 7th Edition     Pathologic stage from 05/17/2014: Stage IIA (T3, N0, cM0) - Signed by Truitt Merle, MD on 02/21/2015 Cancer of right colon [facility])   Staging form: Colon and Rectum, AJCC 7th Edition     Pathologic stage from 12/08/2014: Stage IIIB (T4a, N1a, cM0) - Signed by Truitt Merle, MD on 02/21/2015       Cancer of right colon ([place])   04/09/2014 Procedure    Colonoscopy per Dr. Silvano Rusk: Large fleshy mass sigmoid colon 25 cm from anus. Could not pass through.      04/09/2014 Tumor Marker    CEA =11.7      04/12/2014 Imaging    CT C/A/P:sigmoid colon mass;several small areas of soft tissue nodularity in peritoneal cavity; small attenuation structure posterior right hepatic lobe; 2 stable nodules in lungs dating back to 2014. No acute findings.      05/17/2014 Initial Diagnosis    Colon cancer-presented with heme + stool and more frequent BMs      05/17/2014 Surgery    Robot assisted sigmoidectomy      05/17/2014 Pathologic Stage    pT3,pN0,pMX--Grade 1--0/13 nodes +--MMR normal      05/17/2014 Clinical Stage    Stage II      12/08/2014 Surgery    right hemicolectomy       12/08/2014 Pathology Results    right colon adenocarcinoma, pT4aN1c, 19 nodes were negative, LVI(+), MMR normal       01/17/2015 - 06/27/2015 Chemotherapy    adjuvant capecitabine 1564m q12h, 2 weeks on and 1 week off. dose reduction and stopped  after 6 cycles due to poor tolerance,       08/15/2015 Progression    CT abdomen showed peritoneal nodules, highly suspicious for recurrent metastasis      10/10/2015 Imaging    PET scan showed multiple enlarging, hypermetabolic peritoneal nodules consistent with peritoneal carcinomatosis. No other evidence of metastasis      11/04/2015 Pathology Results    Diagnosis 11/04/2015 Peritoneum, biopsy, RLQ - METASTATIC COLORECTAL ADENOCARCINOMA. - SEE COMMENT. -[facility]      02/02/2016 Imaging    CT CAP W CONTRAST 02/02/2016 IMPRESSION: 1. Extensive worsening of peritoneal spread of tumor with numerous large new tumor nodules scattered throughout The upper omentum and also scattered within along the mesentery. Mild extension into the right retroperitoneum at the level of the iliac crest. 2. Slight reduction in size of the right upper lobe pulmonary nodule 1.1 cm, previously 1.2 cm. Stable indistinct ground-glass density nodule in the right upper lobe. 3. Airway thickening is present, suggesting bronchitis or reactive airways disease. 4. Ascending aortic aneurysm 4.0 cm in diameter. Recommend annual imaging followup by CTA or MRA. This recommendation follows 2010 ACCF/AHA/AATS/ACR/ASA/SCA/SCAI/SIR/STS/SVM Guidelines for the Diagnosis and Management of Patients with Thoracic Aortic Disease. Circulation. 2010; 121: e266-e369 5. Cholelithiasis.      02/08/2016 -  Chemotherapy    5-fu infusion and Avastin every 2 weeks  HISTORY OF PRESENTING ILLNESS:  Heidi Marquez 83 y.o. female is here because of recently diagnosed a stage II sigmoid colon cancer.  She had annual physical with her PCP in early Feb this year, and stool OB (+), no overt GI bleeding, CBC was normal. She had noticed some loose BM before that, but no nausea, loss of appetite, weight loss or other symoptoms. Colonoscopy showed a large mass in the sigmoid colon 25 cm from anus. Biopsy was taken which  showed tubulovillous adenoma, no malignancy. She was referred to surgeon Dr. Marcello Moores and underwent robotic-assisted sigmoidectomy on 4/4. She tolerated the procedure well, and was discharged home afterwards.  She has recovered well from the surgery 3 weeks ago, she has mild fatigue, but otherwise doing very well without other complains. She had 2-3 episodes of bleeding after the surgery, which was related to her herromods. She is eating well. 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   She has chronic knee pain she takes ibuprofen once daily.    CURRENT THERAPY: chemotherapy with 5-Fu and Avastin every 2 weeks, started on 02/08/2016  INTERIM HISTORY: Heidi Marquez returns for follow-up and 4th cycle chemo. She has been doing well. After her last cycle of chemo, she had some nausea for a few days, but took medication which helped. She didn't have diarrhea, but had very frequent bowel movements. Imodium helped. She has some abdominal cramping that lasts anywhere from a few minutes to 4 hours. She has been tired and has less energy. She has been taking naps. Denies any other concerns.   MEDICAL HISTORY:  Past Medical History:  Diagnosis Date  . Adenocarcinoma of colon [facility]) oncologist-  dr Truitt Merle---  last chemo 01-17-2015 to 06-27-2015)   dx 05-17-2014 sigmoid (left) cancer, Stage 2, G1 (pT3N0M0), MSI stable -- s/p  sigmoidectomy /  dx 12-08-2014 ascending colon cancer (right) , Stage 3B, G2, (pT4aN1cM0), MSI stable -- s/p  right hemicolectomy/  08-2015  recurrent peritoneal carcinomatosis  . Arthritis    knees  . Coronary atherosclerosis   . Diverticulosis of colon   . GERD (gastroesophageal reflux disease)   . HH (hiatus hernia)   . History of basal cell carcinoma excision    right side nose 2013  s/p  moh's  . History of epistaxis    2005  caurterized twice  . History of esophageal dilatation    2009 for stricture  . History of esophagitis     reflux  . Internal hemorrhoids   . Macular degeneration of both eyes   . Multinodular thyroid    remote hx benign bx 2005  . Peritoneal carcinomatosis ([place])    secondary to primary colon cancer  . Pulmonary nodule, right    noted since 2014-- stable per last ct 06-15-2015  . Thoracic ascending aortic aneurysm [facility])    followed by dr Cyndia Bent ([facility])  last ct 06-15-2015  4.3cm    SURGICAL HISTORY: Past Surgical History:  Procedure Laterality Date  . COLONOSCOPY WITH PROPOFOL N/A 09/30/2014   Procedure: COLONOSCOPY WITH PROPOFOL;  Surgeon: Milus Banister, MD;  Location: [facility];  Service: Endoscopy;  Laterality: N/A;  . ESOPHAGOGASTRODUODENOSCOPY  2009  . INGUINAL HERNIA REPAIR Right early 1990s  . MOHS SURGERY  11/2011   nose  . PORTACATH PLACEMENT Right 01/24/2016   Procedure: INSERTION PORT-A-CATH;  Surgeon: Leighton Ruff, MD;  Location: [facility];  Service: General;  Laterality: Right;  .  ROBOTIC ASSISTED RIGHT HEMICOLECTOMY  69/48/5462    dr Leighton Ruff  . ROBOTIC ASSISTED SIGMOIDECTOMY  70/35/0093   dr Leighton Ruff  . UPPER GASTROINTESTINAL ENDOSCOPY    . VAGINAL HYSTERECTOMY  age 22   partial with the bladder tack    SOCIAL HISTORY: Social History   Social History  . Marital status: Married    Spouse name: N/A  . Number of children: N/A  . Years of education: N/A   Occupational History  . Not on file.   Social History Main Topics  . Smoking status: Never Smoker  . Smokeless tobacco: Never Used  . Alcohol use No  . Drug use: No  . Sexual activity: Not on file   Other Topics Concern  . Not on file   Social History Narrative   Married, husband is disabled (physical and dementia) and she is caregiver   Has #1 child (daughter) living. Son died of MI at age 38   Worked at [employer] before she retired   Has #3 cats    FAMILY HISTORY: Family History  Problem Relation Age of Onset  . Colon cancer Paternal Grandfather 53     colon cancer   . Stroke Father   . Heart attack Son 67    died of heart attack   . Esophageal cancer Neg Hx   . Rectal cancer Neg Hx   . Stomach cancer Neg Hx     ALLERGIES:  is allergic to sulfa antibiotics; amoxicillin-pot clavulanate; and penicillins.  MEDICATIONS:  Current Outpatient Prescriptions  Medication Sig Dispense Refill  . alendronate (FOSAMAX) 70 MG tablet Take 70 mg by mouth every 7 (seven) days. Sundays. Take with a full glass of water on an empty stomach.    . Ascorbic Acid (VITAMIN C) 1000 MG tablet Take 1,000 mg by mouth daily.    . Biotin 1000 MCG tablet Take 1,000 mcg by mouth daily.     Marland Kitchen CALCIUM & MAGNESIUM CARBONATES PO Take 1 tablet by mouth daily.    . cholecalciferol (VITAMIN D) 1000 UNITS tablet Take 1,000 Units by mouth daily.    . diphenoxylate-atropine (LOMOTIL) 2.5-0.025 MG tablet Take 1-2 tablets by mouth 4 (four) times daily as needed for diarrhea or loose stools. 30 tablet 2  . glucosamine-chondroitin 500-400 MG tablet Take 1 tablet by mouth daily.     . Homeopathic Products ([employer] LEG CRAMPS PAIN RELIEF PO) Take 1 tablet by mouth daily as needed (for leg cramps).    Marland Kitchen ibuprofen (ADVIL,MOTRIN) 200 MG tablet Take 200 mg by mouth daily as needed for moderate pain.     Marland Kitchen lidocaine-prilocaine (EMLA) cream Apply to affected area once 30 g 3  . loratadine (CLARITIN) 10 MG tablet Take 10 mg by mouth daily.    . Multiple Vitamin (MULTIVITAMIN) tablet Take 1 tablet by mouth daily.    . ondansetron (ZOFRAN) 8 MG tablet Take 1 tablet (8 mg total) by mouth every 8 (eight) hours as needed (Nausea or vomiting). 30 tablet 1  . potassium chloride SA (K-DUR,KLOR-CON) 20 MEQ tablet Take 1 tablet (20 mEq total) by mouth daily. (Patient taking differently: Take 20 mEq by mouth every other day. ) 20 tablet 1  . ranitidine (ZANTAC) 150 MG tablet Take 150 mg by mouth as needed for heartburn.    . urea (CARMOL) 10 % cream Apply topically 2 (two) times daily. Apply to hand twice  daily 142 g 0  . vitamin B-12 (CYANOCOBALAMIN) 1000 MCG tablet Take  1,000 mcg by mouth daily.     . prochlorperazine (COMPAZINE) 10 MG tablet Take 1 tablet (10 mg total) by mouth every 8 (eight) hours as needed for nausea (Nausea or vomiting). (Patient not taking: Reported on 03/22/2016) 30 tablet 0  . traMADol (ULTRAM) 50 MG tablet Take 1 tablet (50 mg total) by mouth every 6 (six) hours as needed. 30 tablet 1   No current facility-administered medications for this visit.     REVIEW OF SYSTEMS:   Constitutional: Denies fevers, chills or abnormal night sweats (+) fatigue Eyes: Denies blurriness of vision, double vision or watery eyes Ears, nose, mouth, throat, and face: Denies mucositis or sore throat Respiratory: Denies cough, dyspnea or wheezes Cardiovascular: Denies palpitation, chest discomfort or lower extremity swelling Gastrointestinal:  Denies nausea, heartburn or change in bowel habits (+) frequent bowel movements, nausea, abdominal cramping Skin: Denies abnormal skin rashes Lymphatics: Denies new lymphadenopathy or easy bruising Neurological:Denies numbness, or new weaknesses (+) tingling right thumb. Behavioral/Psych: Mood is stable, no new changes  All other systems were reviewed with the patient and are negative.   PHYSICAL EXAMINATION: ECOG PERFORMANCE STATUS: 1  Vitals:   03/22/16 1347  BP: (!) 147/88  Pulse: 78  Resp: 18   Filed Weights   03/22/16 1347  Weight: 138 lb (62.6 kg)    GENERAL:alert, no distress and comfortable SKIN: skin color, texture, turgor are normal, no rashes or significant lesions  EYES: normal, conjunctiva are pink and non-injected, sclera clear OROPHARYNX:no exudate, no erythema and lips, buccal mucosa, and tongue normal  NECK: supple, thyroid normal size, non-tender, without nodularity LYMPH:  no palpable lymphadenopathy in the cervical, axillary or inguinal LUNGS: clear to auscultation and percussion with normal breathing effort HEART:  regular rate & rhythm and no murmurs and no lower extremity edema ABDOMEN:abdomen soft, surgical incision and to the low abdomen is well healed. non-tender and normal bowel sounds, rectal exam showed internal and external hemorrhoids, no bleeding, stool was green. Musculoskeletal:no cyanosis of digits and no clubbing  PSYCH: alert & oriented x 3 with fluent speech NEURO: no focal motor/sensory deficits   LABORATORY DATA:  I have reviewed the data as listed CBC Latest Ref Rng & Units 03/22/2016 03/08/2016 02/23/2016  WBC 3.9 - 10.3 10e3/uL 5.8 6.4 5.3  Hemoglobin 11.6 - 15.9 g/dL 11.5(L) 11.8 10.4(L)  Hematocrit 34.8 - 46.6 % 34.5(L) 35.0 30.6(L)  Platelets 145 - 400 10e3/uL 148 143(L) 143(L)   CMP Latest Ref Rng & Units 03/22/2016 03/08/2016 02/23/2016  Glucose 70 - 140 mg/dl 105 90 97  BUN 7.0 - 26.0 mg/dL 16.9 16.9 22.4  Creatinine 0.6 - 1.1 mg/dL 0.8 0.8 0.9  Sodium 136 - 145 mEq/L 142 141 143  Potassium 3.5 - 5.1 mEq/L 4.4 4.3 3.9  Chloride 101 - 111 mmol/L - - -  CO2 22 - 29 mEq/L _0 Calcium 8.4 - 10.4 mg/dL 9.4 9.5 8.8  Total Protein 6.4 - 8.3 g/dL 6.7 6.9 6.0(L)  Total Bilirubin 0.20 - 1.20 mg/dL 0.61 0.73 0.48  Alkaline Phos 40 - 150 U/L 90 87 80  AST 5 - 34 U/L _1 ALT 0 - 55 U/L _2 CEA 05/02/2015: 2.6 08/15/2015: 9.6 12/16/2015: 253 03/08/2016: 463  Pathology report:  Diagnosis 11/04/2015 Peritoneum, biopsy, RLQ - METASTATIC COLORECTAL ADENOCARCINOMA. - SEE COMMENT. Microscopic Comment Dr. Burr Medico was paged on 11/07/2015. Per request, a block will be sent to [facility] for additional testing and the  results reported separately. (JBK:kh 11/07/2015)  Diagnosis 05/17/2014 1. Colon, segmental resection for tumor, sigmoid, open end proximal - INVASIVE ADENOCARCINOMA WITH EXTRACELLULAR MUCIN, INVADING THROUGH THE MUSCULARIS PROPRIA INTO PERICOLONIC FATTY TISSUE. - THIRTEEN LYMPH NODES, NEGATIVE FOR METASTATIC CARCINOMA (0/13). - MULTIPLE DIVERTICULA. -  RESECTION MARGINS, NEGATIVE FOR ATYPIA OR MALIGNANCY. - PLEASE SEE ONCOLOGY TEMPLATE FOR DETAILS. 2. Colon, resection margin (donut), sigmoid - BENIGN COLONIC MUCOSA, NO EVIDENCE OF DYSPLASIA OR MALIGNANCY. Specimen: Left colon. Procedure: Segmental resection. Tumor site: Sigmoid colon. Specimen integrity: Intact. Macroscopic intactness of mesorectum: N/A Macroscopic tumor perforation: No Invasive tumor: Invasive adenocarcinoma with extracellular mucin. Maximum size: 8.0 cm, gross measurement. Histologic type(s): Invasive adenocarcinoma with extracellular mucin. Histologic grade and differentiation: G1: well differentiated/low grade Type of polyp in which invasive carcinoma arose: Villous adenoma. Microscopic extension of invasive tumor: Invading through the muscularis propria into pericolonic fatty tissue. Lymph-Vascular invasion: Not identified. Peri-neural invasion: Not identified. Tumor deposit(s) (discontinuous extramural extension): None. Resection margins: Negative. Proximal margin: 9.6 cm. Distal margin: 9.6 cm. Mesenteric margin (sigmoid and transverse): 6 cm. Treatment effect (neo-adjuvant therapy): No Additional polyp(s): N/A Non-neoplastic findings: Prominent Crohn-like reaction around the tumor. Lymph nodes: number examined 13; number positive: 0 Pathologic Staging: pT3, pN0, pMX  Diagnosis 12/08/2014  Colon, segmental resection for tumor, right colon MODERATELY DIFFERENTIATED ADENOCARCINOMA OF THE COLON (3.8 CM) THE TUMOR PENETRATES TO THE SURFACE OF THE VISCERAL PERITONEUM (T4A) LYMPHOVASCULAR INVASION IS IDENTIFIED MARGINS OF RESECTION ARE NEGATIVE FOR TUMOR NINTEEN BENIGN LYMPH NODE (0/19) ONE TUMOR DEPOSITE IS IDENTIFIED ([place]) Specimen: Colon Procedure: Segmental resection Tumor site: Ascending colon Specimen integrity: Intact Macroscopic intactness of mesorectum: Not applicable: Macroscopic tumor perforation: The tumor invades through the muscularis  propria penatrate the surface of serosa Invasive tumor: Maximum size: 3.8 Histologic type(s): Adenocarcinoma G2 G2: moderately differentiated/low grade Type of polyp in which invasive carcinoma arose: Tubular adenoma Microscopic extension of invasive tumor: Serosa Lymph-Vascular invasion: Present Peri-neural invasion: Negative Tumor deposit(s) (discontinuous extramural extension): Present Resection margins: Proximal margin: 12 cm Distal margin: 23 cm Circumferential (radial) (posterior ascending, posterior descending; lateral and posterior mid-rectum; and entire lower 1/3 rectum):3.5 cm Mesenteric margin (sigmoid and transverse): NA Distance closest margin (if all above margins negative): _ Treatment effect (neo-adjuvant therapy): Negative Additional polyp(s): Negative Non-neoplastic findings: Unremarkabel Lymph nodes: number examined 19; number positive: 0 Pathologic Staging: T4a, N1c, M_  MMR: NORMAL MSI: STABLE  Mismatch Repair (MMR) Protein Immunohistochemistry (IHC) IHC Expression Result: MLH1: Preserved nuclear expression (greater 50% tumor expression) MSH2: Preserved nuclear expression (greater 50% tumor expression) MSH6: Preserved nuclear expression (greater 50% tumor expression) PMS2: Preserved nuclear expression (greater 50% tumor expression) * Internal control demonstrates intact nuclear expression Interpretation: NORMAL  RADIOGRAPHIC STUDIES: I have personally reviewed the radiological images as listed and agreed with the findings in the report.  CT CAP 02/02/2016 IMPRESSION: 1. Extensive worsening of peritoneal spread of tumor with numerous large new tumor nodules scattered throughout The upper omentum and also scattered within along the mesentery. Mild extension into the right retroperitoneum at the level of the iliac crest. 2. Slight reduction in size of the right upper lobe pulmonary nodule 1.1 cm, previously 1.2 cm. Stable indistinct ground-glass  density nodule in the right upper lobe. 3. Airway thickening is present, suggesting bronchitis or reactive airways disease. 4. Ascending aortic aneurysm 4.0 cm in diameter. Recommend annual imaging followup by CTA or MRA. This recommendation follows 2010 ACCF/AHA/AATS/ACR/ASA/SCA/SCAI/SIR/STS/SVM Guidelines for the Diagnosis and Management of Patients with Thoracic Aortic Disease. Circulation. 2010; 121: B716-R678  5. Cholelithiasis.   PET 10/10/2015 IMPRESSION: 1. Multiple enlarging, hypermetabolic peritoneal nodules consistent with peritoneal carcinomatosis. No generalized ascites. 2. No evidence of metastatic disease to the liver. 3. No suspicious pulmonary findings. Grossly stable chronic postinflammatory changes. 4. Multinodular goiter.  Diagnosis 11/04/2015 Peritoneum, biopsy, RLQ - METASTATIC COLORECTAL ADENOCARCINOMA. - SEE COMMENT. Microscopic Comment Dr. Burr Medico was paged on 11/07/2015. Per request, a block will be sent to [facility] for additional testing and the results reported separately. (JBK:kh 11/07/2015)  ASSESSMENT & PLAN:  83 y.o. Caucasian female, with past medical history of colon diverticulosis and arthritis, but otherwise healthy and active, who was found to have a large sigmoid tumor, status post sigmoidectomy.  1. Sigmoid colon adenocarcinoma, pT3N0M0, stage II, G1, MSI stable, ascending colon adenocarcinoma, pT4aN1cM0, stage IIIB, MSI stable, G2, recurrent peritoneal carcinomatosis in 08/2015 -She had multifocal (2) colon cancer status post complete surgical resection,  -I previously reviewed her recent surgical pathology results in great details with her. The right colon cancer has several high-risk features, including stage IIIB, T4a lesion, tumor deposits, lymphovascular invasion, her risk of colon cancer recurrence is high.  -She tried adjuvant chemotherapy Xeloda, but could not tolerate full dose, and last cycle chemotherapy was canceled due to her  tolerance issue. -I previously reviewed her restaging CT scan from 08/2015, which showed a 2 new peritoneal nodule, suspicious for peritoneal carcinomatosis. She is not symptomatic now. -Her to follow-up her CEA has also increased lately, concerning for cancer recurrence. -I previously reviewed her recent PET scan from 10/10/2015, which unfortunately showed multiple enlarging, hypermetabolic peritoneal nodules consistent with peritoneal carcinomatosis. No ascites or other metastasis. -I previously discussed her peritoneal mass biopsy, which confirmed metastatic colon cancer  -We previously discussed that her cancer is not curable at this stage, she is not a candidate for debulking and HIPEC, and her goal of care is palliation.  -Her [facility] genomic testing results were discussed with the patient. She has (+) KRAS mutation, not a candidate for EGFR inhibitor   -Her tumor has MSI stable, not a good candidate for immunotherapy alone  -She has started first-line palliative chemotherapy with 5-FU infusion and Avastin, tolerate treatment better w/o 5-fu bolus -She is doing well overall, lab reviewed, adequate for treatment, we'll proceed with 4th cycle 5-FU and Avastin -I will order a repeat CT scan after her 5th cycle.   2. Rectal bleeding and diarrhea  -she developed diarrhea and rectal bleeding after first cycle chemo, probably related to chemotherapy, although it's little unusual that it happened 1 week after her chemotherapy -Bleeding likely secondary to hemorrhoids, possibly related to Avastin. -I encouraged her to use Imodium, I also gave a prescription of Lomotil for diarrhea -No significant diarrhea or bleeding after cycle 2 and 3 of chemo   3. Peripheral neuropathy, G 1 -From previous chemotherapy Xeloda, mild and stable now.  4. GERD -she'll continue Protonix.  5.Lung nodules  -She has two small right lung nodules which were discovered on the CT scan in 09/2012,  RUL nodule  slightly bigger on recent CT -She has been follow-up with Dr. Cyndia Bent.  -she never smoked, low risk for lung cancer -given the stability of the RUL nodule over 2 years, this is likely a benign lesion   6. Support -Her husband's dementia has been progressing and he has become aggressive and dangerous. -I have encouraged her to speak to our social worker.  -We discussed speaking to his doctor to get medication to help control his aggressive  behavior.  -Her daughter would like to put him in a dementia facility.    7. Abdominal Pain -She has developed some abdominal pain lately.  -She will take tylenol for the pain -Prescribed Tramadol. Take PRN up to 3 times a day.   8. HTN -Slightly elevated today. No history of HTN, possible related to Avastin  -She will start monitoring and writing down her BP readings at home.  -She is not on BP medication.    Plan -Lab reviewed, we'll proceed to 4th cycle 5-FU and Avastin, no 5-FU bolus, continue every 2 weeks -I prescribed tramadol as needed for abdominal pain -We'll see her back in 4 weeks with restaging CT one week before   All questions were answered. The patient knows to call the clinic with any problems, questions or concerns.  I spent 20 minutes counseling the patient face to face. The total time spent in the appointment was 25  minutes and more than 50% was on counseling.  This document serves as a record of services personally performed by Truitt Merle, MD. It was created on her behalf by Martinique Casey, a trained medical scribe. The creation of this record is based on the scribe's personal observations and the provider's statements to them. This document has been checked and approved by the attending provider.  I have reviewed the above documentation for accuracy and completeness, and I agree with the above information.      Truitt Merle  03/22/2016

## 2016-03-21 ENCOUNTER — Other Ambulatory Visit: Payer: Self-pay

## 2016-03-21 DIAGNOSIS — C182 Malignant neoplasm of ascending colon: Secondary | ICD-10-CM

## 2016-03-22 ENCOUNTER — Ambulatory Visit (HOSPITAL_BASED_OUTPATIENT_CLINIC_OR_DEPARTMENT_OTHER): Payer: PPO

## 2016-03-22 ENCOUNTER — Ambulatory Visit (HOSPITAL_BASED_OUTPATIENT_CLINIC_OR_DEPARTMENT_OTHER): Payer: PPO | Admitting: Hematology

## 2016-03-22 ENCOUNTER — Other Ambulatory Visit (HOSPITAL_BASED_OUTPATIENT_CLINIC_OR_DEPARTMENT_OTHER): Payer: PPO

## 2016-03-22 ENCOUNTER — Encounter: Payer: Self-pay | Admitting: Hematology

## 2016-03-22 VITALS — BP 147/88 | HR 78 | Resp 18 | Ht 63.0 in | Wt 138.0 lb

## 2016-03-22 DIAGNOSIS — C182 Malignant neoplasm of ascending colon: Secondary | ICD-10-CM

## 2016-03-22 DIAGNOSIS — Z5112 Encounter for antineoplastic immunotherapy: Secondary | ICD-10-CM

## 2016-03-22 DIAGNOSIS — C186 Malignant neoplasm of descending colon: Secondary | ICD-10-CM

## 2016-03-22 DIAGNOSIS — G620 Drug-induced polyneuropathy: Secondary | ICD-10-CM | POA: Diagnosis not present

## 2016-03-22 DIAGNOSIS — R918 Other nonspecific abnormal finding of lung field: Secondary | ICD-10-CM

## 2016-03-22 DIAGNOSIS — K219 Gastro-esophageal reflux disease without esophagitis: Secondary | ICD-10-CM

## 2016-03-22 DIAGNOSIS — Z5111 Encounter for antineoplastic chemotherapy: Secondary | ICD-10-CM

## 2016-03-22 DIAGNOSIS — Z95828 Presence of other vascular implants and grafts: Secondary | ICD-10-CM

## 2016-03-22 DIAGNOSIS — C187 Malignant neoplasm of sigmoid colon: Secondary | ICD-10-CM

## 2016-03-22 DIAGNOSIS — R109 Unspecified abdominal pain: Secondary | ICD-10-CM

## 2016-03-22 LAB — COMPREHENSIVE METABOLIC PANEL
ALT: 9 U/L (ref 0–55)
AST: 17 U/L (ref 5–34)
Albumin: 3.8 g/dL (ref 3.5–5.0)
Alkaline Phosphatase: 90 U/L (ref 40–150)
Anion Gap: 6 mEq/L (ref 3–11)
BILIRUBIN TOTAL: 0.61 mg/dL (ref 0.20–1.20)
BUN: 16.9 mg/dL (ref 7.0–26.0)
CHLORIDE: 107 meq/L (ref 98–109)
CO2: 29 mEq/L (ref 22–29)
CREATININE: 0.8 mg/dL (ref 0.6–1.1)
Calcium: 9.4 mg/dL (ref 8.4–10.4)
EGFR: 67 mL/min/{1.73_m2} — AB (ref >90)
GLUCOSE: 105 mg/dL (ref 70–140)
Potassium: 4.4 mEq/L (ref 3.5–5.1)
SODIUM: 142 meq/L (ref 136–145)
TOTAL PROTEIN: 6.7 g/dL (ref 6.4–8.3)

## 2016-03-22 LAB — CBC WITH DIFFERENTIAL/PLATELET
BASO%: 0.5 % (ref 0.0–2.0)
Basophils Absolute: 0 10*3/uL (ref 0.0–0.1)
EOS%: 3.8 % (ref 0.0–7.0)
Eosinophils Absolute: 0.2 10*3/uL (ref 0.0–0.5)
HCT: 34.5 % — ABNORMAL LOW (ref 34.8–46.6)
HGB: 11.5 g/dL — ABNORMAL LOW (ref 11.6–15.9)
LYMPH%: 12.2 % — AB (ref 14.0–49.7)
MCH: 31 pg (ref 25.1–34.0)
MCHC: 33.3 g/dL (ref 31.5–36.0)
MCV: 93 fL (ref 79.5–101.0)
MONO#: 0.6 10*3/uL (ref 0.1–0.9)
MONO%: 10.1 % (ref 0.0–14.0)
NEUT%: 73.4 % (ref 38.4–76.8)
NEUTROS ABS: 4.2 10*3/uL (ref 1.5–6.5)
Platelets: 148 10*3/uL (ref 145–400)
RBC: 3.71 10*6/uL (ref 3.70–5.45)
RDW: 14.6 % — ABNORMAL HIGH (ref 11.2–14.5)
WBC: 5.8 10*3/uL (ref 3.9–10.3)
lymph#: 0.7 10*3/uL — ABNORMAL LOW (ref 0.9–3.3)

## 2016-03-22 LAB — UA PROTEIN, DIPSTICK - CHCC: PROTEIN: NEGATIVE mg/dL

## 2016-03-22 MED ORDER — SODIUM CHLORIDE 0.9 % IV SOLN: Freq: Once | INTRAVENOUS | Status: DC

## 2016-03-22 MED ORDER — SODIUM CHLORIDE 0.9% FLUSH
10.0000 mL | INTRAVENOUS | Status: DC | PRN
  Administered 2016-03-22: 10 mL via INTRAVENOUS
  Filled 2016-03-22: qty 10

## 2016-03-22 MED ORDER — PROCHLORPERAZINE MALEATE 10 MG PO TABS
ORAL_TABLET | ORAL | Status: AC
  Filled 2016-03-22: qty 1

## 2016-03-22 MED ORDER — SODIUM CHLORIDE 0.9 % IV SOLN
Freq: Once | INTRAVENOUS | Status: AC
  Administered 2016-03-22: 15:00:00 via INTRAVENOUS

## 2016-03-22 MED ORDER — SODIUM CHLORIDE 0.9 % IV SOLN
2400.0000 mg/m2 | INTRAVENOUS | Status: DC
  Administered 2016-03-22: 4050 mg via INTRAVENOUS
  Filled 2016-03-22: qty 81

## 2016-03-22 MED ORDER — LEUCOVORIN CALCIUM INJECTION 350 MG
400.0000 mg/m2 | Freq: Once | INTRAVENOUS | Status: AC
  Administered 2016-03-22: 676 mg via INTRAVENOUS
  Filled 2016-03-22: qty 33.8

## 2016-03-22 MED ORDER — TRAMADOL HCL 50 MG PO TABS: 50.0000 mg | ORAL_TABLET | Freq: Four times a day (QID) | ORAL | 1 refills | Status: DC | PRN

## 2016-03-22 MED ORDER — SODIUM CHLORIDE 0.9 % IV SOLN
5.0000 mg/kg | Freq: Once | INTRAVENOUS | Status: AC
  Administered 2016-03-22: 325 mg via INTRAVENOUS
  Filled 2016-03-22: qty 13

## 2016-03-22 MED ORDER — PROCHLORPERAZINE MALEATE 10 MG PO TABS
10.0000 mg | ORAL_TABLET | Freq: Once | ORAL | Status: AC
  Administered 2016-03-22: 10 mg via ORAL

## 2016-03-22 NOTE — Progress Notes (Signed)
Per Dr. Burr Medico, adjust 5 FU chemo pump to be completed by 1 pm on Saturday  03/24/16.  Pharmacy notified.

## 2016-03-22 NOTE — Patient Instructions (Signed)
[place] Discharge Instructions for Patients Receiving Chemotherapy  Today you received the following chemotherapy agents:  Avastin, leucovorin, Fluorouracil  To help prevent nausea and vomiting after your treatment, we encourage you to take your nausea medication as prescribed.   If you develop nausea and vomiting that is not controlled by your nausea medication, call the clinic.   BELOW ARE SYMPTOMS THAT SHOULD BE REPORTED IMMEDIATELY:  *FEVER GREATER THAN 100.5 F  *CHILLS WITH OR WITHOUT FEVER  NAUSEA AND VOMITING THAT IS NOT CONTROLLED WITH YOUR NAUSEA MEDICATION  *UNUSUAL SHORTNESS OF BREATH  *UNUSUAL BRUISING OR BLEEDING  TENDERNESS IN MOUTH AND THROAT WITH OR WITHOUT PRESENCE OF ULCERS  *URINARY PROBLEMS  *BOWEL PROBLEMS  UNUSUAL RASH Items with * indicate a potential emergency and should be followed up as soon as possible.  Feel free to call the clinic you have any questions or concerns. The clinic phone number is [phone].  Please show the [place] at check-in to the Emergency Department and triage nurse.  -

## 2016-03-24 ENCOUNTER — Telehealth: Payer: Self-pay

## 2016-03-24 ENCOUNTER — Ambulatory Visit (HOSPITAL_BASED_OUTPATIENT_CLINIC_OR_DEPARTMENT_OTHER): Payer: PPO

## 2016-03-24 VITALS — BP 163/82 | HR 68 | Temp 97.2°F | Resp 18

## 2016-03-24 DIAGNOSIS — C186 Malignant neoplasm of descending colon: Secondary | ICD-10-CM

## 2016-03-24 DIAGNOSIS — C182 Malignant neoplasm of ascending colon: Secondary | ICD-10-CM

## 2016-03-24 MED ORDER — HEPARIN SOD (PORK) LOCK FLUSH 100 UNIT/ML IV SOLN
500.0000 [IU] | Freq: Once | INTRAVENOUS | Status: AC | PRN
  Administered 2016-03-24: 500 [IU]
  Filled 2016-03-24: qty 5

## 2016-03-24 MED ORDER — SODIUM CHLORIDE 0.9% FLUSH
10.0000 mL | INTRAVENOUS | Status: DC | PRN
Start: 2016-03-24 — End: 2016-03-24
  Administered 2016-03-24: 10 mL
  Filled 2016-03-24: qty 10

## 2016-03-24 NOTE — Telephone Encounter (Signed)
Called and left a message with new appts and to get a new sch today in chemo if she would like    Nashon Erbes

## 2016-04-05 ENCOUNTER — Ambulatory Visit: Payer: Self-pay | Admitting: Hematology

## 2016-04-05 ENCOUNTER — Other Ambulatory Visit (HOSPITAL_BASED_OUTPATIENT_CLINIC_OR_DEPARTMENT_OTHER): Payer: PPO

## 2016-04-05 ENCOUNTER — Ambulatory Visit (HOSPITAL_BASED_OUTPATIENT_CLINIC_OR_DEPARTMENT_OTHER): Payer: PPO

## 2016-04-05 VITALS — BP 152/78 | HR 79 | Temp 97.6°F | Resp 18

## 2016-04-05 DIAGNOSIS — C182 Malignant neoplasm of ascending colon: Secondary | ICD-10-CM | POA: Diagnosis not present

## 2016-04-05 DIAGNOSIS — Z5112 Encounter for antineoplastic immunotherapy: Secondary | ICD-10-CM

## 2016-04-05 DIAGNOSIS — C187 Malignant neoplasm of sigmoid colon: Secondary | ICD-10-CM | POA: Diagnosis not present

## 2016-04-05 DIAGNOSIS — C186 Malignant neoplasm of descending colon: Secondary | ICD-10-CM

## 2016-04-05 DIAGNOSIS — Z95828 Presence of other vascular implants and grafts: Secondary | ICD-10-CM

## 2016-04-05 LAB — CBC WITH DIFFERENTIAL/PLATELET
BASO%: 0.8 % (ref 0.0–2.0)
Basophils Absolute: 0.1 10*3/uL (ref 0.0–0.1)
EOS ABS: 0.2 10*3/uL (ref 0.0–0.5)
EOS%: 3.2 % (ref 0.0–7.0)
HEMATOCRIT: 35.6 % (ref 34.8–46.6)
HEMOGLOBIN: 11.9 g/dL (ref 11.6–15.9)
LYMPH#: 0.8 10*3/uL — AB (ref 0.9–3.3)
LYMPH%: 11.4 % — ABNORMAL LOW (ref 14.0–49.7)
MCH: 31.5 pg (ref 25.1–34.0)
MCHC: 33.5 g/dL (ref 31.5–36.0)
MCV: 94.2 fL (ref 79.5–101.0)
MONO#: 0.5 10*3/uL (ref 0.1–0.9)
MONO%: 7.4 % (ref 0.0–14.0)
NEUT%: 77.2 % — ABNORMAL HIGH (ref 38.4–76.8)
NEUTROS ABS: 5.5 10*3/uL (ref 1.5–6.5)
PLATELETS: 140 10*3/uL — AB (ref 145–400)
RBC: 3.78 10*6/uL (ref 3.70–5.45)
RDW: 15 % — ABNORMAL HIGH (ref 11.2–14.5)
WBC: 7.1 10*3/uL (ref 3.9–10.3)

## 2016-04-05 LAB — COMPREHENSIVE METABOLIC PANEL
ALBUMIN: 3.8 g/dL (ref 3.5–5.0)
ALK PHOS: 90 U/L (ref 40–150)
ALT: 10 U/L (ref 0–55)
ANION GAP: 8 meq/L (ref 3–11)
AST: 19 U/L (ref 5–34)
BILIRUBIN TOTAL: 0.71 mg/dL (ref 0.20–1.20)
BUN: 18.5 mg/dL (ref 7.0–26.0)
CALCIUM: 9.2 mg/dL (ref 8.4–10.4)
CO2: 28 meq/L (ref 22–29)
CREATININE: 0.8 mg/dL (ref 0.6–1.1)
Chloride: 107 mEq/L (ref 98–109)
EGFR: 67 mL/min/{1.73_m2} — AB (ref >90)
Glucose: 104 mg/dl (ref 70–140)
Potassium: 4.1 mEq/L (ref 3.5–5.1)
Sodium: 142 mEq/L (ref 136–145)
TOTAL PROTEIN: 6.7 g/dL (ref 6.4–8.3)

## 2016-04-05 LAB — CEA (IN HOUSE-CHCC): CEA (CHCC-IN HOUSE): 443.62 ng/mL — AB (ref 0.00–5.00)

## 2016-04-05 MED ORDER — SODIUM CHLORIDE 0.9 % IV SOLN
2400.0000 mg/m2 | INTRAVENOUS | Status: DC
  Administered 2016-04-05: 4050 mg via INTRAVENOUS
  Filled 2016-04-05: qty 81

## 2016-04-05 MED ORDER — SODIUM CHLORIDE 0.9 % IV SOLN
Freq: Once | INTRAVENOUS | Status: AC
  Administered 2016-04-05: 15:00:00 via INTRAVENOUS

## 2016-04-05 MED ORDER — PROCHLORPERAZINE MALEATE 10 MG PO TABS
ORAL_TABLET | ORAL | Status: AC
  Filled 2016-04-05: qty 1

## 2016-04-05 MED ORDER — PROCHLORPERAZINE MALEATE 10 MG PO TABS
10.0000 mg | ORAL_TABLET | Freq: Once | ORAL | Status: AC
  Administered 2016-04-05: 10 mg via ORAL

## 2016-04-05 MED ORDER — HEPARIN SOD (PORK) LOCK FLUSH 100 UNIT/ML IV SOLN
500.0000 [IU] | Freq: Once | INTRAVENOUS | Status: DC | PRN
  Filled 2016-04-05: qty 5

## 2016-04-05 MED ORDER — SODIUM CHLORIDE 0.9 % IV SOLN
5.0000 mg/kg | Freq: Once | INTRAVENOUS | Status: AC
  Administered 2016-04-05: 325 mg via INTRAVENOUS
  Filled 2016-04-05: qty 13

## 2016-04-05 MED ORDER — LEUCOVORIN CALCIUM INJECTION 350 MG
400.0000 mg/m2 | Freq: Once | INTRAVENOUS | Status: AC
  Administered 2016-04-05: 676 mg via INTRAVENOUS
  Filled 2016-04-05: qty 33.8

## 2016-04-05 MED ORDER — CLONIDINE HCL 0.1 MG PO TABS
ORAL_TABLET | ORAL | Status: AC
  Filled 2016-04-05: qty 1

## 2016-04-05 MED ORDER — CLONIDINE HCL 0.1 MG PO TABS
0.1000 mg | ORAL_TABLET | Freq: Once | ORAL | Status: AC
  Administered 2016-04-05: 0.1 mg via ORAL

## 2016-04-05 MED ORDER — SODIUM CHLORIDE 0.9% FLUSH
10.0000 mL | INTRAVENOUS | Status: DC | PRN
  Administered 2016-04-05: 10 mL via INTRAVENOUS
  Filled 2016-04-05: qty 10

## 2016-04-05 MED ORDER — SODIUM CHLORIDE 0.9% FLUSH
10.0000 mL | INTRAVENOUS | Status: DC | PRN
  Filled 2016-04-05: qty 10

## 2016-04-05 NOTE — Progress Notes (Signed)
Patient's blood pressure 186/93, pulse 79. Patient denies headache, vision problems. Patient does not check her blood pressure at home. Dr. Burr Medico notified. Give clonidine 0.1 mg PO now, recheck blood pressure and proceed with treatment if blood pressure recheck lower per Dr. Burr Medico. Patient verbalized understanding to above mentioned plan.

## 2016-04-05 NOTE — Patient Instructions (Signed)
[place] Discharge Instructions for Patients Receiving Chemotherapy  Today you received the following chemotherapy agent:  Avastin, Leucovorin, Fluorouracil  To help prevent nausea and vomiting after your treatment, we encourage you to take your nausea medication as prescribed.   If you develop nausea and vomiting that is not controlled by your nausea medication, call the clinic.   BELOW ARE SYMPTOMS THAT SHOULD BE REPORTED IMMEDIATELY:  *FEVER GREATER THAN 100.5 F  *CHILLS WITH OR WITHOUT FEVER  NAUSEA AND VOMITING THAT IS NOT CONTROLLED WITH YOUR NAUSEA MEDICATION  *UNUSUAL SHORTNESS OF BREATH  *UNUSUAL BRUISING OR BLEEDING  TENDERNESS IN MOUTH AND THROAT WITH OR WITHOUT PRESENCE OF ULCERS  *URINARY PROBLEMS  *BOWEL PROBLEMS  UNUSUAL RASH Items with * indicate a potential emergency and should be followed up as soon as possible.  Feel free to call the clinic you have any questions or concerns. The clinic phone number is [phone].  Please show the [place] at check-in to the Emergency Department and triage nurse.

## 2016-04-07 ENCOUNTER — Ambulatory Visit: Payer: PPO

## 2016-04-07 VITALS — BP 156/89 | HR 81 | Temp 98.4°F | Resp 20

## 2016-04-07 DIAGNOSIS — C182 Malignant neoplasm of ascending colon: Secondary | ICD-10-CM

## 2016-04-07 DIAGNOSIS — C186 Malignant neoplasm of descending colon: Secondary | ICD-10-CM

## 2016-04-07 MED ORDER — HEPARIN SOD (PORK) LOCK FLUSH 100 UNIT/ML IV SOLN
500.0000 [IU] | Freq: Once | INTRAVENOUS | Status: AC | PRN
  Administered 2016-04-07: 500 [IU]
  Filled 2016-04-07: qty 5

## 2016-04-07 MED ORDER — SODIUM CHLORIDE 0.9% FLUSH
10.0000 mL | INTRAVENOUS | Status: DC | PRN
  Administered 2016-04-07: 10 mL
  Filled 2016-04-07: qty 10

## 2016-04-07 NOTE — Patient Instructions (Signed)
Central Lines A central line is a thin tube (catheter) that is put in your vein to give you medicine or food. It may also be used to take blood for lab tests. There are different types of central lines. The type of central line you receive depends on how long it will be needed, your medical condition, and the condition of your veins.  HOME CARE   Follow your doctor's instructions for the type of central line that you have.  Change bandages as told by your doctor. Look for redness or irritation during a bandage change. If a bandage gets wet, have it changed right away.  Keep the area where the central line goes into your skin clean at all times.   Use saline solution to clear out (flush) your central line as told by your doctor.  Always wash your hands before changing bandages or rinsing out the central line.  If the central line accidentally gets pulled on, make sure that:  The bandage is okay.  There is no bleeding.  The line has not been pulled out.   Limit lifting, using your arm, or doing other activity as told by your doctor.   Swim or bathe only if your doctor approves. Your doctor can tell you how to keep your specific type of bandage from getting wet.   GET HELP RIGHT AWAY IF:   You have chills.  You have a fever.   You have shortness of breath or trouble breathing.   You have chest pain.   You feel your heart beating fast or "skipping" beats.   You feel dizzy or faint.  You have bleeding that does not stop from the site of the central line.   You have pain, redness, puffiness (swelling), or drainage at the site of the central line.   You have swelling in your neck, face, chest, or arm on the side of your central line.   Your central line is hard to rinse out.  You do not get a blood return from the central line.  You have a hole or tear in your central line.   Your central line leaks when rinsed out or when fluids are put into it. MAKE  SURE YOU:   Understand these instructions.   Will watch your condition.   Will get help right away if you are not doing well or get worse.  This information is not intended to replace advice given to you by your health care provider. Make sure you discuss any questions you have with your health care provider. Document Released: 01/16/2012 Document Revised: 05/23/2015 Document Reviewed: 01/16/2012 [employer] Interactive Patient Education  2017 [employer]  

## 2016-04-17 ENCOUNTER — Ambulatory Visit (HOSPITAL_COMMUNITY)
Admission: RE | Admit: 2016-04-17 | Discharge: 2016-04-17 | Disposition: A | Discharge status: Home / Self Care | Payer: PPO | Source: Ambulatory Visit | Attending: Hematology | Admitting: Hematology

## 2016-04-17 ENCOUNTER — Encounter (HOSPITAL_COMMUNITY): Payer: Self-pay

## 2016-04-17 DIAGNOSIS — C786 Secondary malignant neoplasm of retroperitoneum and peritoneum: Secondary | ICD-10-CM | POA: Diagnosis not present

## 2016-04-17 DIAGNOSIS — C186 Malignant neoplasm of descending colon: Secondary | ICD-10-CM

## 2016-04-17 DIAGNOSIS — R918 Other nonspecific abnormal finding of lung field: Secondary | ICD-10-CM | POA: Insufficient documentation

## 2016-04-17 DIAGNOSIS — K8020 Calculus of gallbladder without cholecystitis without obstruction: Secondary | ICD-10-CM | POA: Diagnosis not present

## 2016-04-17 DIAGNOSIS — R911 Solitary pulmonary nodule: Secondary | ICD-10-CM | POA: Diagnosis not present

## 2016-04-17 MED ORDER — IOPAMIDOL (ISOVUE-300) INJECTION 61%
INTRAVENOUS | Status: AC
  Filled 2016-04-17: qty 100

## 2016-04-17 MED ORDER — IOPAMIDOL (ISOVUE-300) INJECTION 61%
100.0000 mL | Freq: Once | INTRAVENOUS | Status: AC | PRN
  Administered 2016-04-17: 100 mL via INTRAVENOUS

## 2016-04-18 NOTE — Progress Notes (Signed)
- [place]  Telephone:(336) [phone] Fax:(336) [phone]  Clinic Follow Up Note   Patient Care Team: Prince Solian, MD as PCP - General (Internal Medicine) Gaye Pollack, MD as Consulting Physician (Cardiothoracic Surgery) Gatha Mayer, MD as Consulting Physician (Gastroenterology) Leighton Ruff, MD as Consulting Physician (General Surgery) Tania Ade, RN as Registered Nurse (Medical Oncology) 04/19/2016  CHIEF COMPLAINTS:  Follow-up of colon cancer  Oncology History   Cancer of left colon [facility])   Staging form: Colon and Rectum, AJCC 7th Edition     Pathologic stage from 05/17/2014: Stage IIA (T3, N0, cM0) - Signed by Truitt Merle, MD on 02/21/2015 Cancer of right colon [facility])   Staging form: Colon and Rectum, AJCC 7th Edition     Pathologic stage from 12/08/2014: Stage IIIB (T4a, N1a, cM0) - Signed by Truitt Merle, MD on 02/21/2015       Cancer of right colon ([place])   04/09/2014 Procedure    Colonoscopy per Dr. Silvano Rusk: Large fleshy mass sigmoid colon 25 cm from anus. Could not pass through.      04/09/2014 Tumor Marker    CEA =11.7      04/12/2014 Imaging    CT C/A/P:sigmoid colon mass;several small areas of soft tissue nodularity in peritoneal cavity; small attenuation structure posterior right hepatic lobe; 2 stable nodules in lungs dating back to 2014. No acute findings.      05/17/2014 Initial Diagnosis    Colon cancer-presented with heme + stool and more frequent BMs      05/17/2014 Surgery    Robot assisted sigmoidectomy      05/17/2014 Pathologic Stage    pT3,pN0,pMX--Grade 1--0/13 nodes +--MMR normal      05/17/2014 Clinical Stage    Stage II      12/08/2014 Surgery    right hemicolectomy       12/08/2014 Pathology Results    right colon adenocarcinoma, pT4aN1c, 19 nodes were negative, LVI(+), MMR normal       01/17/2015 - 06/27/2015 Chemotherapy    adjuvant capecitabine 1557m q12h, 2 weeks on and 1 week off. dose reduction and stopped  after 6 cycles due to poor tolerance,       08/15/2015 Progression    CT abdomen showed peritoneal nodules, highly suspicious for recurrent metastasis      10/10/2015 Imaging    PET scan showed multiple enlarging, hypermetabolic peritoneal nodules consistent with peritoneal carcinomatosis. No other evidence of metastasis      11/04/2015 Pathology Results    Diagnosis 11/04/2015 Peritoneum, biopsy, RLQ - METASTATIC COLORECTAL ADENOCARCINOMA. - SEE COMMENT. -[facility]      02/02/2016 Imaging    CT CAP W CONTRAST 02/02/2016 IMPRESSION: 1. Extensive worsening of peritoneal spread of tumor with numerous large new tumor nodules scattered throughout The upper omentum and also scattered within along the mesentery. Mild extension into the right retroperitoneum at the level of the iliac crest. 2. Slight reduction in size of the right upper lobe pulmonary nodule 1.1 cm, previously 1.2 cm. Stable indistinct ground-glass density nodule in the right upper lobe. 3. Airway thickening is present, suggesting bronchitis or reactive airways disease. 4. Ascending aortic aneurysm 4.0 cm in diameter. Recommend annual imaging followup by CTA or MRA. This recommendation follows 2010 ACCF/AHA/AATS/ACR/ASA/SCA/SCAI/SIR/STS/SVM Guidelines for the Diagnosis and Management of Patients with Thoracic Aortic Disease. Circulation. 2010; 121: e266-e369 5. Cholelithiasis.      02/08/2016 -  Chemotherapy    5-fu infusion and Avastin every 2 weeks  04/17/2016 Imaging    CT CAP  IMPRESSION: 1. Slight interval improvement in extensive peritoneal carcinomatosis compared with prior CT from 02/02/2016. No definite disease progression. 2. No evidence of thoracic or hepatic metastases. 3. No definite bowel wall invasion, ascites or bowel obstruction. 4. Cholelithiasis. 5. Stable mild pulmonary nodularity, likely benign.       HISTORY OF PRESENTING ILLNESS:  Heidi Marquez 83 y.o. female is here  because of recently diagnosed a stage II sigmoid colon cancer.  She had annual physical with her PCP in early Feb this year, and stool OB (+), no overt GI bleeding, CBC was normal. She had noticed some loose BM before that, but no nausea, loss of appetite, weight loss or other symoptoms. Colonoscopy showed a large mass in the sigmoid colon 25 cm from anus. Biopsy was taken which showed tubulovillous adenoma, no malignancy. She was referred to surgeon Dr. Marcello Moores and underwent robotic-assisted sigmoidectomy on 4/4. She tolerated the procedure well, and was discharged home afterwards.  She has recovered well from the surgery 3 weeks ago, she has mild fatigue, but otherwise doing very well without other complains. She had 2-3 episodes of bleeding after the surgery, which was related to her herromods. She is eating well. 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   She has chronic knee pain she takes ibuprofen once daily.    CURRENT THERAPY: chemotherapy with 5-Fu and Avastin every 2 weeks, started on 02/08/2016  INTERIM HISTORY: Mrs. Hubble returns for follow-up and 6th cycle chemo. She reports the chemo is "manageable" overall. Since the patient's most recent scan, she reports diarrhea from the contrast. She is taking 3-4 Imodium daily to manage this. She reports occasional abdominal discomfort, which she attributes to the diarrhea. The patient reports staying well hydrated. She denies nausea. She reports a good appetite. She reports fair energy levels.   MEDICAL HISTORY:  Past Medical History:  Diagnosis Date  . Adenocarcinoma of colon [facility]) oncologist-  dr Truitt Merle---  last chemo 01-17-2015 to 06-27-2015)   dx 05-17-2014 sigmoid (left) cancer, Stage 2, G1 (pT3N0M0), MSI stable -- s/p  sigmoidectomy /  dx 12-08-2014 ascending colon cancer (right) , Stage 3B, G2, (pT4aN1cM0), MSI stable -- s/p  right hemicolectomy/  08-2015  recurrent peritoneal  carcinomatosis  . Arthritis    knees  . Coronary atherosclerosis   . Diverticulosis of colon   . GERD (gastroesophageal reflux disease)   . HH (hiatus hernia)   . History of basal cell carcinoma excision    right side nose 2013  s/p  moh's  . History of epistaxis    2005  caurterized twice  . History of esophageal dilatation    2009 for stricture  . History of esophagitis    reflux  . Internal hemorrhoids   . Macular degeneration of both eyes   . Multinodular thyroid    remote hx benign bx 2005  . Peritoneal carcinomatosis ([place])    secondary to primary colon cancer  . Pulmonary nodule, right    noted since 2014-- stable per last ct 06-15-2015  . Thoracic ascending aortic aneurysm [facility])    followed by dr Cyndia Bent ([facility])  last ct 06-15-2015  4.3cm    SURGICAL HISTORY: Past Surgical History:  Procedure Laterality Date  . COLONOSCOPY WITH PROPOFOL N/A 09/30/2014   Procedure: COLONOSCOPY WITH PROPOFOL;  Surgeon: Milus Banister, MD;  Location: [facility];  Service: Endoscopy;  Laterality: N/A;  .  ESOPHAGOGASTRODUODENOSCOPY  2009  . INGUINAL HERNIA REPAIR Right early 1990s  . MOHS SURGERY  11/2011   nose  . PORTACATH PLACEMENT Right 01/24/2016   Procedure: INSERTION PORT-A-CATH;  Surgeon: Leighton Ruff, MD;  Location: [facility];  Service: General;  Laterality: Right;  . ROBOTIC ASSISTED RIGHT HEMICOLECTOMY  60/60/0459    dr Leighton Ruff  . ROBOTIC ASSISTED SIGMOIDECTOMY  97/74/1423   dr Leighton Ruff  . UPPER GASTROINTESTINAL ENDOSCOPY    . VAGINAL HYSTERECTOMY  age 41   partial with the bladder tack    SOCIAL HISTORY: Social History   Social History  . Marital status: Married    Spouse name: N/A  . Number of children: N/A  . Years of education: N/A   Occupational History  . Not on file.   Social History Main Topics  . Smoking status: Never Smoker  . Smokeless tobacco: Never Used  . Alcohol use No  . Drug use: No  . Sexual activity: Not on file    Other Topics Concern  . Not on file   Social History Narrative   Married, husband is disabled (physical and dementia) and she is caregiver   Has #1 child (daughter) living. Son died of MI at age 9   Worked at [employer] before she retired   Has #3 cats    FAMILY HISTORY: Family History  Problem Relation Age of Onset  . Colon cancer Paternal Grandfather 50    colon cancer   . Stroke Father   . Heart attack Son 48    died of heart attack   . Esophageal cancer Neg Hx   . Rectal cancer Neg Hx   . Stomach cancer Neg Hx     ALLERGIES:  is allergic to sulfa antibiotics; amoxicillin-pot clavulanate; and penicillins.  MEDICATIONS:  Current Outpatient Prescriptions  Medication Sig Dispense Refill  . alendronate (FOSAMAX) 70 MG tablet Take 70 mg by mouth every 7 (seven) days. Sundays. Take with a full glass of water on an empty stomach.    . Ascorbic Acid (VITAMIN C) 1000 MG tablet Take 1,000 mg by mouth daily.    . Biotin 1000 MCG tablet Take 1,000 mcg by mouth daily.     Marland Kitchen CALCIUM & MAGNESIUM CARBONATES PO Take 1 tablet by mouth daily.    . cholecalciferol (VITAMIN D) 1000 UNITS tablet Take 1,000 Units by mouth daily.    . diphenoxylate-atropine (LOMOTIL) 2.5-0.025 MG tablet Take 1-2 tablets by mouth 4 (four) times daily as needed for diarrhea or loose stools. 30 tablet 2  . glucosamine-chondroitin 500-400 MG tablet Take 1 tablet by mouth daily.     . Homeopathic Products ([employer] LEG CRAMPS PAIN RELIEF PO) Take 1 tablet by mouth daily as needed (for leg cramps).    Marland Kitchen ibuprofen (ADVIL,MOTRIN) 200 MG tablet Take 200 mg by mouth daily as needed for moderate pain.     Marland Kitchen lidocaine-prilocaine (EMLA) cream Apply to affected area once 30 g 3  . loratadine (CLARITIN) 10 MG tablet Take 10 mg by mouth daily.    . Multiple Vitamin (MULTIVITAMIN) tablet Take 1 tablet by mouth daily.    . ondansetron (ZOFRAN) 8 MG tablet Take 1 tablet (8 mg total) by mouth every 8 (eight) hours as needed  (Nausea or vomiting). 30 tablet 1  . potassium chloride SA (K-DUR,KLOR-CON) 20 MEQ tablet Take 1 tablet (20 mEq total) by mouth daily. (Patient taking differently: Take 20 mEq by mouth every other day. ) 20  tablet 1  . ranitidine (ZANTAC) 150 MG tablet Take 150 mg by mouth as needed for heartburn.    . traMADol (ULTRAM) 50 MG tablet Take 1 tablet (50 mg total) by mouth every 6 (six) hours as needed. 30 tablet 1  . urea (CARMOL) 10 % cream Apply topically 2 (two) times daily. Apply to hand twice daily 142 g 0  . vitamin B-12 (CYANOCOBALAMIN) 1000 MCG tablet Take 1,000 mcg by mouth daily.     Marland Kitchen amLODipine (NORVASC) 5 MG tablet Take 1 tablet (5 mg total) by mouth daily. 30 tablet 1  . prochlorperazine (COMPAZINE) 10 MG tablet Take 1 tablet (10 mg total) by mouth every 8 (eight) hours as needed for nausea (Nausea or vomiting). (Patient not taking: Reported on 03/22/2016) 30 tablet 0   No current facility-administered medications for this visit.    Facility-Administered Medications Ordered in Other Visits  Medication Dose Route Frequency Provider Last Rate Last Dose  . sodium chloride flush (NS) 0.9 % injection 10 mL  10 mL Intracatheter PRN Truitt Merle, MD   10 mL at 04/21/16 1131    REVIEW OF SYSTEMS:  Constitutional: Denies fevers, chills or abnormal night sweats (+) improved energy, good appetite Eyes: Denies blurriness of vision, double vision or watery eyes Ears, nose, mouth, throat, and face: Denies mucositis or sore throat Respiratory: Denies cough, dyspnea or wheezes Cardiovascular: Denies palpitation, chest discomfort or lower extremity swelling Gastrointestinal:  Denies nausea, heartburn or change in bowel habits (+) frequent bowel movements, diarrhea, abdominal cramping Skin: Denies abnormal skin rashes Lymphatics: Denies new lymphadenopathy or easy bruising Neurological:Denies numbness, or new weaknesses (+) tingling right thumb. Behavioral/Psych: Mood is stable, no new changes  All  other systems were reviewed with the patient and are negative.   PHYSICAL EXAMINATION: ECOG PERFORMANCE STATUS: 1  Vitals:   04/19/16 1055  BP: (!) 168/84  Pulse: 81  Resp: 17  Temp: 98.4 F (36.9 C)   Filed Weights   04/19/16 1055  Weight: 139 lb (63 kg)    GENERAL:alert, no distress and comfortable SKIN: skin color, texture, turgor are normal, no rashes or significant lesions  EYES: normal, conjunctiva are pink and non-injected, sclera clear OROPHARYNX:no exudate, no erythema and lips, buccal mucosa, and tongue normal  NECK: supple, thyroid normal size, non-tender, without nodularity LYMPH:  no palpable lymphadenopathy in the cervical, axillary or inguinal LUNGS: clear to auscultation and percussion with normal breathing effort HEART: regular rate & rhythm and no murmurs and no lower extremity edema ABDOMEN:abdomen soft, surgical incision and to the low abdomen is well healed. non-tender and normal bowel sounds, rectal exam showed internal and external hemorrhoids, no bleeding, stool was green. Musculoskeletal:no cyanosis of digits and no clubbing  PSYCH: alert & oriented x 3 with fluent speech NEURO: no focal motor/sensory deficits   LABORATORY DATA:  I have reviewed the data as listed CBC Latest Ref Rng & Units 04/19/2016 04/05/2016 03/22/2016  WBC 3.9 - 10.3 10e3/uL 6.2 7.1 5.8  Hemoglobin 11.6 - 15.9 g/dL 11.7 11.9 11.5(L)  Hematocrit 34.8 - 46.6 % 35.1 35.6 34.5(L)  Platelets 145 - 400 10e3/uL 142(L) 140(L) 148   CMP Latest Ref Rng & Units 04/19/2016 04/05/2016 03/22/2016  Glucose 70 - 140 mg/dl 109 104 105  BUN 7.0 - 26.0 mg/dL 14.7 18.5 16.9  Creatinine 0.6 - 1.1 mg/dL 0.9 0.8 0.8  Sodium 136 - 145 mEq/L 144 142 142  Potassium 3.5 - 5.1 mEq/L 3.7 4.1 4.4  Chloride 101 -  111 mmol/L - - -  CO2 22 - 29 mEq/L '26 28 29  ' Calcium 8.4 - 10.4 mg/dL 9.2 9.2 9.4  Total Protein 6.4 - 8.3 g/dL 6.5 6.7 6.7  Total Bilirubin 0.20 - 1.20 mg/dL 1.12 0.71 0.61  Alkaline Phos 40 - 150 U/L  79 90 90  AST 5 - 34 U/L '19 19 17  ' ALT 0 - 55 U/L '8 10 9   ' CEA 05/02/2015: 2.6 08/15/2015: 9.6 12/16/2015: 253 03/08/2016: 463 04/05/16: 443.6  Pathology report:  Diagnosis 11/04/2015 Peritoneum, biopsy, RLQ - METASTATIC COLORECTAL ADENOCARCINOMA. - SEE COMMENT.  Diagnosis 05/17/2014 1. Colon, segmental resection for tumor, sigmoid, open end proximal - INVASIVE ADENOCARCINOMA WITH EXTRACELLULAR MUCIN, INVADING THROUGH THE MUSCULARIS PROPRIA INTO PERICOLONIC FATTY TISSUE. - THIRTEEN LYMPH NODES, NEGATIVE FOR METASTATIC CARCINOMA (0/13). - MULTIPLE DIVERTICULA. - RESECTION MARGINS, NEGATIVE FOR ATYPIA OR MALIGNANCY. - PLEASE SEE ONCOLOGY TEMPLATE FOR DETAILS. 2. Colon, resection margin (donut), sigmoid - BENIGN COLONIC MUCOSA, NO EVIDENCE OF DYSPLASIA OR MALIGNANCY.  Diagnosis 12/08/2014  Colon, segmental resection for tumor, right colon MODERATELY DIFFERENTIATED ADENOCARCINOMA OF THE COLON (3.8 CM) THE TUMOR PENETRATES TO THE SURFACE OF THE VISCERAL PERITONEUM (T4A) LYMPHOVASCULAR INVASION IS IDENTIFIED MARGINS OF RESECTION ARE NEGATIVE FOR TUMOR NINTEEN BENIGN LYMPH NODE (0/19) ONE TUMOR DEPOSITE IS IDENTIFIED ([place])   RADIOGRAPHIC STUDIES: I have personally reviewed the radiological images as listed and agreed with the findings in the report.  CT CAP 04/17/16 IMPRESSION: 1. Slight interval improvement in extensive peritoneal carcinomatosis compared with prior CT from 02/02/2016. No definite disease progression. 2. No evidence of thoracic or hepatic metastases. 3. No definite bowel wall invasion, ascites or bowel obstruction. 4. Cholelithiasis. 5. Stable mild pulmonary nodularity, likely benign.  CT CAP 02/02/2016 IMPRESSION: 1. Extensive worsening of peritoneal spread of tumor with numerous large new tumor nodules scattered throughout The upper omentum and also scattered within along the mesentery. Mild extension into the right retroperitoneum at the level of the iliac  crest. 2. Slight reduction in size of the right upper lobe pulmonary nodule 1.1 cm, previously 1.2 cm. Stable indistinct ground-glass density nodule in the right upper lobe. 3. Airway thickening is present, suggesting bronchitis or reactive airways disease. 4. Ascending aortic aneurysm 4.0 cm in diameter. Recommend annual imaging followup by CTA or MRA. This recommendation follows 2010 ACCF/AHA/AATS/ACR/ASA/SCA/SCAI/SIR/STS/SVM Guidelines for the Diagnosis and Management of Patients with Thoracic Aortic Disease. Circulation. 2010; 121: e266-e369 5. Cholelithiasis.   PET 10/10/2015 IMPRESSION: 1. Multiple enlarging, hypermetabolic peritoneal nodules consistent with peritoneal carcinomatosis. No generalized ascites. 2. No evidence of metastatic disease to the liver. 3. No suspicious pulmonary findings. Grossly stable chronic postinflammatory changes. 4. Multinodular goiter.  Diagnosis 11/04/2015 Peritoneum, biopsy, RLQ - METASTATIC COLORECTAL ADENOCARCINOMA. - SEE COMMENT.  ASSESSMENT & PLAN:  83 y.o. Caucasian female, with past medical history of colon diverticulosis and arthritis, but otherwise healthy and active, who was found to have a large sigmoid tumor, status post sigmoidectomy.  1. Sigmoid colon adenocarcinoma, pT3N0M0, stage II, G1, MSI stable, ascending colon adenocarcinoma, pT4aN1cM0, stage IIIB, MSI stable, G2, recurrent peritoneal carcinomatosis in 08/2015 -She had multifocal (2) colon cancer status post complete surgical resection,  -I previously reviewed her recent surgical pathology results in great details with her. The right colon cancer has several high-risk features, including stage IIIB, T4a lesion, tumor deposits, lymphovascular invasion, her risk of colon cancer recurrence is high.  -She tried adjuvant chemotherapy Xeloda, but could not tolerate full dose, and last cycle chemotherapy was canceled due to  her tolerance issue. -I previously reviewed her restaging  CT scan from 08/2015, which showed a 2 new peritoneal nodule, suspicious for peritoneal carcinomatosis. She is not symptomatic now. -Her to follow-up her CEA has also increased lately, concerning for cancer recurrence. -I previously reviewed her recent PET scan from 10/10/2015, which unfortunately showed multiple enlarging, hypermetabolic peritoneal nodules consistent with peritoneal carcinomatosis. No ascites or other metastasis. -I previously discussed her peritoneal mass biopsy, which confirmed metastatic colon cancer  -We previously discussed that her cancer is not curable at this stage, she is not a candidate for debulking and HIPEC, and her goal of care is palliation.  -Her [facility] genomic testing results were discussed with the patient. She has (+) KRAS mutation, not a candidate for EGFR inhibitor   -Her tumor has MSI stable, not a good candidate for immunotherapy alone  -She has started first-line palliative chemotherapy with 5-FU infusion and Avastin, tolerate treatment better w/o 5-fu bolus -We reviewed her staging CT scan from 04/17/2016, which showed partial response of her peritoneal metastasis, no other new lesions. -She is doing well overall, lab reviewed, adequate for treatment, we'll proceed with 6th cycle 5-FU and Avastin   2. Rectal bleeding and diarrhea  -she developed diarrhea and rectal bleeding after first cycle chemo, probably related to chemotherapy, although it's little unusual that it happened 1 week after her chemotherapy -Bleeding likely secondary to hemorrhoids, possibly related to Avastin. -I have encouraged her to use Imodium, I also previously gave a prescription of Lomotil for diarrhea -No significant bleeding after cycle 2 and 3 of chemo. The patient experienced some diarrhea from IV contrast following recent scan. -The patient is not taking Lomotil at this time, but I encouraged her to begin using it as needed for diarrhea   3. Peripheral neuropathy, G  1 -From previous chemotherapy Xeloda, mild and stable now.  4. GERD -she'll continue Protonix.  5.Lung nodules  -She has two small right lung nodules which were discovered on the CT scan in 09/2012,  RUL nodule slightly bigger on recent CT -She has been follow-up with Dr. Cyndia Bent.  -she never smoked, low risk for lung cancer -given the stability of the RUL nodule over 2 years, this is likely a benign lesion   6. Support -Her husband's dementia has been progressing and he has become aggressive and dangerous. -I have previously encouraged her to speak to our social worker.  -We previously discussed speaking to his doctor to get medication to help control his aggressive behavior.  -Her daughter would like to put him in a dementia facility.  7. Abdominal Pain -She has developed some abdominal pain lately, likely related to diarrhea and gas.  -She will take tylenol for the pain -Previously prescribed Tramadol. Take PRN up to 3 times a day.   8. HTN -Slightly elevated today. No history of HTN, possible related to Avastin  -She will start monitoring and writing down her BP readings at home.  -She is not on BP medication. I will prescribe Norvasc 5 mg today. We discussed side effects, and the patient is in agreement.   Plan -We reviewed labs and recent scans. Will proceed with labs, flush, and 6th cycle 5-FU and Avastin today, and continue every 2 weeks. -I will prescribe Norvasc 5 mg today for hypertension. She'll monitor her blood pressure at home. -Follow up in 4 weeks.  All questions were answered. The patient knows to call the clinic with any problems, questions or concerns.  I spent 20  minutes counseling the patient face to face. The total time spent in the appointment was 25  minutes and more than 50% was on counseling.  This document serves as a record of services personally performed by Truitt Merle, MD. It was created on her behalf by Maryla Morrow, a trained medical scribe. The  creation of this record is based on the scribe's personal observations and the provider's statements to them. This document has been checked and approved by the attending provider.  I have reviewed the above documentation for accuracy and completeness, and I agree with the above information.      Truitt Merle  04/19/2016

## 2016-04-19 ENCOUNTER — Ambulatory Visit (HOSPITAL_BASED_OUTPATIENT_CLINIC_OR_DEPARTMENT_OTHER): Payer: PPO

## 2016-04-19 ENCOUNTER — Other Ambulatory Visit (HOSPITAL_BASED_OUTPATIENT_CLINIC_OR_DEPARTMENT_OTHER): Payer: PPO

## 2016-04-19 ENCOUNTER — Ambulatory Visit (HOSPITAL_BASED_OUTPATIENT_CLINIC_OR_DEPARTMENT_OTHER): Payer: PPO | Admitting: Hematology

## 2016-04-19 ENCOUNTER — Telehealth: Payer: Self-pay | Admitting: Hematology

## 2016-04-19 ENCOUNTER — Encounter: Payer: Self-pay | Admitting: Hematology

## 2016-04-19 VITALS — BP 168/84 | HR 81 | Temp 98.4°F | Resp 17 | Ht 63.0 in | Wt 139.0 lb

## 2016-04-19 VITALS — BP 174/75 | HR 74 | Resp 16

## 2016-04-19 DIAGNOSIS — R109 Unspecified abdominal pain: Secondary | ICD-10-CM

## 2016-04-19 DIAGNOSIS — C182 Malignant neoplasm of ascending colon: Secondary | ICD-10-CM

## 2016-04-19 DIAGNOSIS — C186 Malignant neoplasm of descending colon: Secondary | ICD-10-CM

## 2016-04-19 DIAGNOSIS — R197 Diarrhea, unspecified: Secondary | ICD-10-CM | POA: Diagnosis not present

## 2016-04-19 DIAGNOSIS — Z95828 Presence of other vascular implants and grafts: Secondary | ICD-10-CM

## 2016-04-19 DIAGNOSIS — C187 Malignant neoplasm of sigmoid colon: Secondary | ICD-10-CM

## 2016-04-19 DIAGNOSIS — Z5112 Encounter for antineoplastic immunotherapy: Secondary | ICD-10-CM | POA: Diagnosis not present

## 2016-04-19 DIAGNOSIS — G620 Drug-induced polyneuropathy: Secondary | ICD-10-CM

## 2016-04-19 DIAGNOSIS — K625 Hemorrhage of anus and rectum: Secondary | ICD-10-CM | POA: Diagnosis not present

## 2016-04-19 DIAGNOSIS — Z5111 Encounter for antineoplastic chemotherapy: Secondary | ICD-10-CM | POA: Diagnosis not present

## 2016-04-19 LAB — CBC WITH DIFFERENTIAL/PLATELET
BASO%: 0.9 % (ref 0.0–2.0)
BASOS ABS: 0.1 10*3/uL (ref 0.0–0.1)
EOS ABS: 0.2 10*3/uL (ref 0.0–0.5)
EOS%: 2.5 % (ref 0.0–7.0)
HEMATOCRIT: 35.1 % (ref 34.8–46.6)
HEMOGLOBIN: 11.7 g/dL (ref 11.6–15.9)
LYMPH#: 0.6 10*3/uL — AB (ref 0.9–3.3)
LYMPH%: 9.1 % — ABNORMAL LOW (ref 14.0–49.7)
MCH: 31.5 pg (ref 25.1–34.0)
MCHC: 33.2 g/dL (ref 31.5–36.0)
MCV: 94.9 fL (ref 79.5–101.0)
MONO#: 0.5 10*3/uL (ref 0.1–0.9)
MONO%: 8.1 % (ref 0.0–14.0)
NEUT#: 4.9 10*3/uL (ref 1.5–6.5)
NEUT%: 79.4 % — AB (ref 38.4–76.8)
PLATELETS: 142 10*3/uL — AB (ref 145–400)
RBC: 3.7 10*6/uL (ref 3.70–5.45)
RDW: 15.6 % — AB (ref 11.2–14.5)
WBC: 6.2 10*3/uL (ref 3.9–10.3)

## 2016-04-19 LAB — COMPREHENSIVE METABOLIC PANEL
ALBUMIN: 3.7 g/dL (ref 3.5–5.0)
ALK PHOS: 79 U/L (ref 40–150)
ALT: 8 U/L (ref 0–55)
ANION GAP: 9 meq/L (ref 3–11)
AST: 19 U/L (ref 5–34)
BUN: 14.7 mg/dL (ref 7.0–26.0)
CALCIUM: 9.2 mg/dL (ref 8.4–10.4)
CHLORIDE: 110 meq/L — AB (ref 98–109)
CO2: 26 mEq/L (ref 22–29)
Creatinine: 0.9 mg/dL (ref 0.6–1.1)
EGFR: 59 mL/min/{1.73_m2} — ABNORMAL LOW (ref >90)
Glucose: 109 mg/dl (ref 70–140)
POTASSIUM: 3.7 meq/L (ref 3.5–5.1)
Sodium: 144 mEq/L (ref 136–145)
Total Bilirubin: 1.12 mg/dL (ref 0.20–1.20)
Total Protein: 6.5 g/dL (ref 6.4–8.3)

## 2016-04-19 MED ORDER — AMLODIPINE BESYLATE 5 MG PO TABS: 5.0000 mg | ORAL_TABLET | Freq: Every day | ORAL | 1 refills | Status: DC

## 2016-04-19 MED ORDER — SODIUM CHLORIDE 0.9% FLUSH
10.0000 mL | INTRAVENOUS | Status: DC | PRN
  Administered 2016-04-19: 10 mL via INTRAVENOUS
  Filled 2016-04-19: qty 10

## 2016-04-19 MED ORDER — SODIUM CHLORIDE 0.9% FLUSH
10.0000 mL | INTRAVENOUS | Status: DC | PRN
  Filled 2016-04-19: qty 10

## 2016-04-19 MED ORDER — PROCHLORPERAZINE MALEATE 10 MG PO TABS
ORAL_TABLET | ORAL | Status: AC
  Filled 2016-04-19: qty 1

## 2016-04-19 MED ORDER — PROCHLORPERAZINE MALEATE 10 MG PO TABS
10.0000 mg | ORAL_TABLET | Freq: Once | ORAL | Status: AC
  Administered 2016-04-19: 10 mg via ORAL

## 2016-04-19 MED ORDER — HEPARIN SOD (PORK) LOCK FLUSH 100 UNIT/ML IV SOLN
500.0000 [IU] | Freq: Once | INTRAVENOUS | Status: DC | PRN
  Filled 2016-04-19: qty 5

## 2016-04-19 MED ORDER — SODIUM CHLORIDE 0.9 % IV SOLN
Freq: Once | INTRAVENOUS | Status: AC
  Administered 2016-04-19: 12:00:00 via INTRAVENOUS

## 2016-04-19 MED ORDER — SODIUM CHLORIDE 0.9 % IV SOLN
5.0000 mg/kg | Freq: Once | INTRAVENOUS | Status: AC
  Administered 2016-04-19: 325 mg via INTRAVENOUS
  Filled 2016-04-19: qty 13

## 2016-04-19 MED ORDER — SODIUM CHLORIDE 0.9 % IV SOLN
2400.0000 mg/m2 | INTRAVENOUS | Status: DC
  Administered 2016-04-19: 4050 mg via INTRAVENOUS
  Filled 2016-04-19: qty 81

## 2016-04-19 MED ORDER — LEUCOVORIN CALCIUM INJECTION 350 MG
400.0000 mg/m2 | Freq: Once | INTRAVENOUS | Status: AC
  Administered 2016-04-19: 676 mg via INTRAVENOUS
  Filled 2016-04-19: qty 33.8

## 2016-04-19 NOTE — Telephone Encounter (Signed)
Appointments scheduled per 3/8 LOS. Patient given AVS report and calendars with future scheduled appointments. °

## 2016-04-19 NOTE — Patient Instructions (Signed)
[facility] Discharge Instructions for Patients Receiving Chemotherapy  Today you received the following chemotherapy agents Avastin, Leucovorin and Adrucil  To help prevent nausea and vomiting after your treatment, we encourage you to take your nausea medication as directed.    If you develop nausea and vomiting that is not controlled by your nausea medication, call the clinic.   BELOW ARE SYMPTOMS THAT SHOULD BE REPORTED IMMEDIATELY:  *FEVER GREATER THAN 100.5 F  *CHILLS WITH OR WITHOUT FEVER  NAUSEA AND VOMITING THAT IS NOT CONTROLLED WITH YOUR NAUSEA MEDICATION  *UNUSUAL SHORTNESS OF BREATH  *UNUSUAL BRUISING OR BLEEDING  TENDERNESS IN MOUTH AND THROAT WITH OR WITHOUT PRESENCE OF ULCERS  *URINARY PROBLEMS  *BOWEL PROBLEMS  UNUSUAL RASH Items with * indicate a potential emergency and should be followed up as soon as possible.  Feel free to call the clinic you have any questions or concerns. The clinic phone number is [phone].  Please show the CHEMO ALERT CARD at check-in to the Emergency Department and triage nurse.   

## 2016-04-21 ENCOUNTER — Ambulatory Visit (HOSPITAL_BASED_OUTPATIENT_CLINIC_OR_DEPARTMENT_OTHER): Payer: Self-pay

## 2016-04-21 ENCOUNTER — Encounter: Payer: Self-pay | Admitting: Hematology

## 2016-04-21 VITALS — BP 152/73 | HR 77 | Temp 97.9°F | Resp 16

## 2016-04-21 DIAGNOSIS — C182 Malignant neoplasm of ascending colon: Secondary | ICD-10-CM

## 2016-04-21 DIAGNOSIS — C186 Malignant neoplasm of descending colon: Secondary | ICD-10-CM

## 2016-04-21 MED ORDER — SODIUM CHLORIDE 0.9% FLUSH
10.0000 mL | INTRAVENOUS | Status: DC | PRN
  Administered 2016-04-21: 10 mL
  Filled 2016-04-21: qty 10

## 2016-04-21 MED ORDER — HEPARIN SOD (PORK) LOCK FLUSH 100 UNIT/ML IV SOLN
500.0000 [IU] | Freq: Once | INTRAVENOUS | Status: AC | PRN
  Administered 2016-04-21: 500 [IU]
  Filled 2016-04-21: qty 5

## 2016-04-26 ENCOUNTER — Other Ambulatory Visit: Payer: Self-pay

## 2016-04-26 ENCOUNTER — Ambulatory Visit: Payer: Self-pay | Admitting: Hematology

## 2016-04-26 ENCOUNTER — Ambulatory Visit: Payer: Self-pay

## 2016-05-01 DIAGNOSIS — R8299 Other abnormal findings in urine: Secondary | ICD-10-CM | POA: Diagnosis not present

## 2016-05-01 DIAGNOSIS — N390 Urinary tract infection, site not specified: Secondary | ICD-10-CM | POA: Diagnosis not present

## 2016-05-01 DIAGNOSIS — E041 Nontoxic single thyroid nodule: Secondary | ICD-10-CM | POA: Diagnosis not present

## 2016-05-01 DIAGNOSIS — M810 Age-related osteoporosis without current pathological fracture: Secondary | ICD-10-CM | POA: Diagnosis not present

## 2016-05-03 ENCOUNTER — Ambulatory Visit (HOSPITAL_BASED_OUTPATIENT_CLINIC_OR_DEPARTMENT_OTHER): Payer: PPO

## 2016-05-03 ENCOUNTER — Ambulatory Visit: Payer: PPO

## 2016-05-03 ENCOUNTER — Other Ambulatory Visit (HOSPITAL_BASED_OUTPATIENT_CLINIC_OR_DEPARTMENT_OTHER): Payer: PPO

## 2016-05-03 VITALS — BP 154/74 | HR 65 | Temp 98.9°F | Resp 16

## 2016-05-03 DIAGNOSIS — C182 Malignant neoplasm of ascending colon: Secondary | ICD-10-CM

## 2016-05-03 DIAGNOSIS — C186 Malignant neoplasm of descending colon: Secondary | ICD-10-CM

## 2016-05-03 DIAGNOSIS — Z95828 Presence of other vascular implants and grafts: Secondary | ICD-10-CM

## 2016-05-03 DIAGNOSIS — Z5111 Encounter for antineoplastic chemotherapy: Secondary | ICD-10-CM

## 2016-05-03 DIAGNOSIS — Z5112 Encounter for antineoplastic immunotherapy: Secondary | ICD-10-CM

## 2016-05-03 LAB — COMPREHENSIVE METABOLIC PANEL
ALT: 15 U/L (ref 0–55)
AST: 22 U/L (ref 5–34)
Albumin: 3.9 g/dL (ref 3.5–5.0)
Alkaline Phosphatase: 98 U/L (ref 40–150)
Anion Gap: 8 mEq/L (ref 3–11)
BILIRUBIN TOTAL: 0.77 mg/dL (ref 0.20–1.20)
BUN: 19.3 mg/dL (ref 7.0–26.0)
CO2: 27 meq/L (ref 22–29)
Calcium: 9.5 mg/dL (ref 8.4–10.4)
Chloride: 106 mEq/L (ref 98–109)
Creatinine: 0.9 mg/dL (ref 0.6–1.1)
EGFR: 64 mL/min/{1.73_m2} — AB (ref >90)
GLUCOSE: 103 mg/dL (ref 70–140)
Potassium: 4.2 mEq/L (ref 3.5–5.1)
SODIUM: 141 meq/L (ref 136–145)
TOTAL PROTEIN: 6.9 g/dL (ref 6.4–8.3)

## 2016-05-03 LAB — CBC WITH DIFFERENTIAL/PLATELET
BASO%: 0.8 % (ref 0.0–2.0)
BASOS ABS: 0.1 10*3/uL (ref 0.0–0.1)
EOS ABS: 0.2 10*3/uL (ref 0.0–0.5)
EOS%: 3.7 % (ref 0.0–7.0)
HCT: 37.3 % (ref 34.8–46.6)
HGB: 12.3 g/dL (ref 11.6–15.9)
LYMPH%: 14.8 % (ref 14.0–49.7)
MCH: 31.5 pg (ref 25.1–34.0)
MCHC: 33 g/dL (ref 31.5–36.0)
MCV: 95.4 fL (ref 79.5–101.0)
MONO#: 0.5 10*3/uL (ref 0.1–0.9)
MONO%: 8.2 % (ref 0.0–14.0)
NEUT%: 72.5 % (ref 38.4–76.8)
NEUTROS ABS: 4.7 10*3/uL (ref 1.5–6.5)
Platelets: 146 10*3/uL (ref 145–400)
RBC: 3.91 10*6/uL (ref 3.70–5.45)
RDW: 15.5 % — ABNORMAL HIGH (ref 11.2–14.5)
WBC: 6.5 10*3/uL (ref 3.9–10.3)
lymph#: 1 10*3/uL (ref 0.9–3.3)

## 2016-05-03 LAB — URINALYSIS, MICROSCOPIC - CHCC
BLOOD: NEGATIVE
Bilirubin (Urine): NEGATIVE
Glucose: NEGATIVE mg/dL
KETONES: NEGATIVE mg/dL
LEUKOCYTE ESTERASE: NEGATIVE
Nitrite: NEGATIVE
PH: 5 (ref 4.6–8.0)
PROTEIN: NEGATIVE mg/dL
RBC / HPF: NEGATIVE (ref 0–2)
SPECIFIC GRAVITY, URINE: 1.02 (ref 1.003–1.035)
UROBILINOGEN UR: 0.2 mg/dL (ref 0.2–1)

## 2016-05-03 LAB — CEA (IN HOUSE-CHCC): CEA (CHCC-In House): 347.82 ng/mL — ABNORMAL HIGH (ref 0.00–5.00)

## 2016-05-03 MED ORDER — DEXTROSE 5 % IV SOLN
400.0000 mg/m2 | Freq: Once | INTRAVENOUS | Status: AC
  Administered 2016-05-03: 676 mg via INTRAVENOUS
  Filled 2016-05-03: qty 33.8

## 2016-05-03 MED ORDER — PROCHLORPERAZINE MALEATE 10 MG PO TABS
ORAL_TABLET | ORAL | Status: AC
  Filled 2016-05-03: qty 1

## 2016-05-03 MED ORDER — SODIUM CHLORIDE 0.9 % IV SOLN
Freq: Once | INTRAVENOUS | Status: AC
  Administered 2016-05-03: 13:00:00 via INTRAVENOUS

## 2016-05-03 MED ORDER — PROCHLORPERAZINE MALEATE 10 MG PO TABS
10.0000 mg | ORAL_TABLET | Freq: Once | ORAL | Status: AC
  Administered 2016-05-03: 10 mg via ORAL

## 2016-05-03 MED ORDER — FLUOROURACIL CHEMO INJECTION 5 GM/100ML
2400.0000 mg/m2 | INTRAVENOUS | Status: DC
  Administered 2016-05-03: 4050 mg via INTRAVENOUS
  Filled 2016-05-03: qty 81

## 2016-05-03 MED ORDER — SODIUM CHLORIDE 0.9 % IV SOLN
5.0000 mg/kg | Freq: Once | INTRAVENOUS | Status: AC
  Administered 2016-05-03: 325 mg via INTRAVENOUS
  Filled 2016-05-03: qty 13

## 2016-05-03 MED ORDER — SODIUM CHLORIDE 0.9 % IV SOLN: Freq: Once | INTRAVENOUS | Status: DC

## 2016-05-03 MED ORDER — SODIUM CHLORIDE 0.9% FLUSH
10.0000 mL | INTRAVENOUS | Status: DC | PRN
  Administered 2016-05-03: 10 mL via INTRAVENOUS
  Filled 2016-05-03: qty 10

## 2016-05-03 NOTE — Patient Instructions (Signed)
[facility] Discharge Instructions for Patients Receiving Chemotherapy  Today you received the following chemotherapy agents Avastin, Leucovorin and Adrucil  To help prevent nausea and vomiting after your treatment, we encourage you to take your nausea medication as directed.    If you develop nausea and vomiting that is not controlled by your nausea medication, call the clinic.   BELOW ARE SYMPTOMS THAT SHOULD BE REPORTED IMMEDIATELY:  *FEVER GREATER THAN 100.5 F  *CHILLS WITH OR WITHOUT FEVER  NAUSEA AND VOMITING THAT IS NOT CONTROLLED WITH YOUR NAUSEA MEDICATION  *UNUSUAL SHORTNESS OF BREATH  *UNUSUAL BRUISING OR BLEEDING  TENDERNESS IN MOUTH AND THROAT WITH OR WITHOUT PRESENCE OF ULCERS  *URINARY PROBLEMS  *BOWEL PROBLEMS  UNUSUAL RASH Items with * indicate a potential emergency and should be followed up as soon as possible.  Feel free to call the clinic you have any questions or concerns. The clinic phone number is [phone].  Please show the CHEMO ALERT CARD at check-in to the Emergency Department and triage nurse.   

## 2016-05-05 ENCOUNTER — Ambulatory Visit: Payer: PPO

## 2016-05-05 VITALS — BP 126/74 | HR 85 | Temp 97.8°F | Resp 17

## 2016-05-05 DIAGNOSIS — C186 Malignant neoplasm of descending colon: Secondary | ICD-10-CM

## 2016-05-05 DIAGNOSIS — C182 Malignant neoplasm of ascending colon: Secondary | ICD-10-CM

## 2016-05-05 MED ORDER — HEPARIN SOD (PORK) LOCK FLUSH 100 UNIT/ML IV SOLN
500.0000 [IU] | Freq: Once | INTRAVENOUS | Status: AC | PRN
  Administered 2016-05-05: 500 [IU]
  Filled 2016-05-05: qty 5

## 2016-05-05 MED ORDER — SODIUM CHLORIDE 0.9% FLUSH
10.0000 mL | INTRAVENOUS | Status: DC | PRN
  Administered 2016-05-05: 10 mL
  Filled 2016-05-05: qty 10

## 2016-05-05 NOTE — Patient Instructions (Signed)
Implanted Port Home Guide An implanted port is a type of central line that is placed under the skin. Central lines are used to provide IV access when treatment or nutrition needs to be given through a person's veins. Implanted ports are used for long-term IV access. An implanted port may be placed because:  You need IV medicine that would be irritating to the small veins in your hands or arms.  You need long-term IV medicines, such as antibiotics.  You need IV nutrition for a long period.  You need frequent blood draws for lab tests.  You need dialysis.  Implanted ports are usually placed in the chest area, but they can also be placed in the upper arm, the abdomen, or the leg. An implanted port has two main parts:  Reservoir. The reservoir is round and will appear as a small, raised area under your skin. The reservoir is the part where a needle is inserted to give medicines or draw blood.  Catheter. The catheter is a thin, flexible tube that extends from the reservoir. The catheter is placed into a large vein. Medicine that is inserted into the reservoir goes into the catheter and then into the vein.  How will I care for my incision site? Do not get the incision site wet. Bathe or shower as directed by your health care provider. How is my port accessed? Special steps must be taken to access the port:  Before the port is accessed, a numbing cream can be placed on the skin. This helps numb the skin over the port site.  Your health care provider uses a sterile technique to access the port. ? Your health care provider must put on a mask and sterile gloves. ? The skin over your port is cleaned carefully with an antiseptic and allowed to dry. ? The port is gently pinched between sterile gloves, and a needle is inserted into the port.  Only "non-coring" port needles should be used to access the port. Once the port is accessed, a blood return should be checked. This helps ensure that the port  is in the vein and is not clogged.  If your port needs to remain accessed for a constant infusion, a clear (transparent) bandage will be placed over the needle site. The bandage and needle will need to be changed every week, or as directed by your health care provider.  Keep the bandage covering the needle clean and dry. Do not get it wet. Follow your health care provider's instructions on how to take a shower or bath while the port is accessed.  If your port does not need to stay accessed, no bandage is needed over the port.  What is flushing? Flushing helps keep the port from getting clogged. Follow your health care provider's instructions on how and when to flush the port. Ports are usually flushed with saline solution or a medicine called heparin. The need for flushing will depend on how the port is used.  If the port is used for intermittent medicines or blood draws, the port will need to be flushed: ? After medicines have been given. ? After blood has been drawn. ? As part of routine maintenance.  If a constant infusion is running, the port may not need to be flushed.  How long will my port stay implanted? The port can stay in for as long as your health care provider thinks it is needed. When it is time for the port to come out, surgery will be   done to remove it. The procedure is similar to the one performed when the port was put in. When should I seek immediate medical care? When you have an implanted port, you should seek immediate medical care if:  You notice a bad smell coming from the incision site.  You have swelling, redness, or drainage at the incision site.  You have more swelling or pain at the port site or the surrounding area.  You have a fever that is not controlled with medicine.  This information is not intended to replace advice given to you by your health care provider. Make sure you discuss any questions you have with your health care provider. Document  Released: 01/29/2005 Document Revised: 07/07/2015 Document Reviewed: 10/06/2012 [employer] Interactive Patient Education  2017 [employer]  

## 2016-05-08 DIAGNOSIS — M5380 Other specified dorsopathies, site unspecified: Secondary | ICD-10-CM | POA: Diagnosis not present

## 2016-05-08 DIAGNOSIS — K219 Gastro-esophageal reflux disease without esophagitis: Secondary | ICD-10-CM | POA: Diagnosis not present

## 2016-05-08 DIAGNOSIS — M810 Age-related osteoporosis without current pathological fracture: Secondary | ICD-10-CM | POA: Diagnosis not present

## 2016-05-08 DIAGNOSIS — N390 Urinary tract infection, site not specified: Secondary | ICD-10-CM | POA: Diagnosis not present

## 2016-05-08 DIAGNOSIS — C189 Malignant neoplasm of colon, unspecified: Secondary | ICD-10-CM | POA: Diagnosis not present

## 2016-05-08 DIAGNOSIS — Z1389 Encounter for screening for other disorder: Secondary | ICD-10-CM | POA: Diagnosis not present

## 2016-05-08 DIAGNOSIS — M1990 Unspecified osteoarthritis, unspecified site: Secondary | ICD-10-CM | POA: Diagnosis not present

## 2016-05-08 DIAGNOSIS — Z0000 Encounter for general adult medical examination without abnormal findings: Secondary | ICD-10-CM | POA: Diagnosis not present

## 2016-05-10 NOTE — Progress Notes (Signed)
- [place]  [phone] [phone]  Clinic Follow Up Note   Patient Care Team: Prince Solian, MD as PCP - General (Internal Medicine) Gaye Pollack, MD as Consulting Physician (Cardiothoracic Surgery) Gatha Mayer, MD as Consulting Physician (Gastroenterology) Leighton Ruff, MD as Consulting Physician (General Surgery) Tania Ade, RN as Registered Nurse (Medical Oncology) 05/17/2016   CHIEF COMPLAINTS:  Follow-up of colon cancer  Oncology History   Cancer of left colon [facility])   Staging form: Colon and Rectum, AJCC 7th Edition     Pathologic stage from 05/17/2014: Stage IIA (T3, N0, cM0) - Signed by Truitt Merle, MD on 02/21/2015 Cancer of right colon [facility])   Staging form: Colon and Rectum, AJCC 7th Edition     Pathologic stage from 12/08/2014: Stage IIIB (T4a, N1a, cM0) - Signed by Truitt Merle, MD on 02/21/2015       Cancer of right colon ([place])   04/09/2014 Procedure    Colonoscopy per Dr. Silvano Rusk: Large fleshy mass sigmoid colon 25 cm from anus. Could not pass through.      04/09/2014 Tumor Marker    CEA =11.7      04/12/2014 Imaging    CT C/A/P:sigmoid colon mass;several small areas of soft tissue nodularity in peritoneal cavity; small attenuation structure posterior right hepatic lobe; 2 stable nodules in lungs dating back to 2014. No acute findings.      05/17/2014 Initial Diagnosis    Colon cancer-presented with heme + stool and more frequent BMs      05/17/2014 Surgery    Robot assisted sigmoidectomy      05/17/2014 Pathologic Stage    pT3,pN0,pMX--Grade 1--0/13 nodes +--MMR normal      05/17/2014 Clinical Stage    Stage II      12/08/2014 Surgery    right hemicolectomy       12/08/2014 Pathology Results    right colon adenocarcinoma, pT4aN1c, 19 nodes were negative, LVI(+), MMR normal       01/17/2015 - 06/27/2015 Chemotherapy    adjuvant capecitabine 1578m q12h, 2 weeks on and 1 week off. dose reduction and stopped  after 6 cycles due to poor tolerance,       08/15/2015 Progression    CT abdomen showed peritoneal nodules, highly suspicious for recurrent metastasis      10/10/2015 Imaging    PET scan showed multiple enlarging, hypermetabolic peritoneal nodules consistent with peritoneal carcinomatosis. No other evidence of metastasis      11/04/2015 Pathology Results    Diagnosis 11/04/2015 Peritoneum, biopsy, RLQ - METASTATIC COLORECTAL ADENOCARCINOMA. - SEE COMMENT. -[facility]      02/02/2016 Imaging    CT CAP W CONTRAST 02/02/2016 IMPRESSION: 1. Extensive worsening of peritoneal spread of tumor with numerous large new tumor nodules scattered throughout The upper omentum and also scattered within along the mesentery. Mild extension into the right retroperitoneum at the level of the iliac crest. 2. Slight reduction in size of the right upper lobe pulmonary nodule 1.1 cm, previously 1.2 cm. Stable indistinct ground-glass density nodule in the right upper lobe. 3. Airway thickening is present, suggesting bronchitis or reactive airways disease. 4. Ascending aortic aneurysm 4.0 cm in diameter. Recommend annual imaging followup by CTA or MRA. This recommendation follows 2010 ACCF/AHA/AATS/ACR/ASA/SCA/SCAI/SIR/STS/SVM Guidelines for the Diagnosis and Management of Patients with Thoracic Aortic Disease. Circulation. 2010; 121: e266-e369 5. Cholelithiasis.      02/08/2016 -  Chemotherapy    5-fu infusion and Avastin every 2 weeks  04/17/2016 Imaging    CT CAP  IMPRESSION: 1. Slight interval improvement in extensive peritoneal carcinomatosis compared with prior CT from 02/02/2016. No definite disease progression. 2. No evidence of thoracic or hepatic metastases. 3. No definite bowel wall invasion, ascites or bowel obstruction. 4. Cholelithiasis. 5. Stable mild pulmonary nodularity, likely benign.       HISTORY OF PRESENTING ILLNESS:  Heidi Marquez 83 y.o. female is here  because of recently diagnosed a stage II sigmoid colon cancer.  She had annual physical with her PCP in early Feb this year, and stool OB (+), no overt GI bleeding, CBC was normal. She had noticed some loose BM before that, but no nausea, loss of appetite, weight loss or other symoptoms. Colonoscopy showed a large mass in the sigmoid colon 25 cm from anus. Biopsy was taken which showed tubulovillous adenoma, no malignancy. She was referred to surgeon Dr. Marcello Moores and underwent robotic-assisted sigmoidectomy on 4/4. She tolerated the procedure well, and was discharged home afterwards.  She has recovered well from the surgery 3 weeks ago, she has mild fatigue, but otherwise doing very well without other complains. She had 2-3 episodes of bleeding after the surgery, which was related to her herromods. She is eating well. 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   She has chronic knee pain she takes ibuprofen once daily.    CURRENT THERAPY: chemotherapy with 5-Fu and Avastin every 2 weeks, started on 02/08/2016  INTERIM HISTORY: [name] returns for follow-up and chemo. She is accompanied by her cousin and since her last chemo she experienced fatigue everyday but can still do her daily routines. She denies nausea but her BMs are more frequent but normal. Denies fevers or chills. When she blows her nose in the morning she experiences some nose bleeding. She is concerned about her hair. She has problems opening cans and experiences numbness.    MEDICAL HISTORY:  Past Medical History:  Diagnosis Date  . Adenocarcinoma of colon [facility]) oncologist-  dr Truitt Merle---  last chemo 01-17-2015 to 06-27-2015)   dx 05-17-2014 sigmoid (left) cancer, Stage 2, G1 (pT3N0M0), MSI stable -- s/p  sigmoidectomy /  dx 12-08-2014 ascending colon cancer (right) , Stage 3B, G2, (pT4aN1cM0), MSI stable -- s/p  right hemicolectomy/  08-2015  recurrent peritoneal carcinomatosis  .  Arthritis    knees  . Coronary atherosclerosis   . Diverticulosis of colon   . GERD (gastroesophageal reflux disease)   . HH (hiatus hernia)   . History of basal cell carcinoma excision    right side nose 2013  s/p  moh's  . History of epistaxis    2005  caurterized twice  . History of esophageal dilatation    2009 for stricture  . History of esophagitis    reflux  . Internal hemorrhoids   . Macular degeneration of both eyes   . Multinodular thyroid    remote hx benign bx 2005  . Peritoneal carcinomatosis ([place])    secondary to primary colon cancer  . Pulmonary nodule, right    noted since 2014-- stable per last ct 06-15-2015  . Thoracic ascending aortic aneurysm [facility])    followed by dr Cyndia Bent ([facility])  last ct 06-15-2015  4.3cm    SURGICAL HISTORY: Past Surgical History:  Procedure Laterality Date  . COLONOSCOPY WITH PROPOFOL N/A 09/30/2014   Procedure: COLONOSCOPY WITH PROPOFOL;  Surgeon: Milus Banister, MD;  Location: [facility] ENDOSCOPY;  Service: Endoscopy;  Laterality: N/A;  . ESOPHAGOGASTRODUODENOSCOPY  2009  . INGUINAL HERNIA REPAIR Right early 1990s  . MOHS SURGERY  11/2011   nose  . PORTACATH PLACEMENT Right 01/24/2016   Procedure: INSERTION PORT-A-CATH;  Surgeon: Leighton Ruff, MD;  [facility];  Service: General;  Laterality: Right;  . ROBOTIC ASSISTED RIGHT HEMICOLECTOMY  94/49/6759    dr Leighton Ruff  . ROBOTIC ASSISTED SIGMOIDECTOMY  16/38/4665   dr Leighton Ruff  . UPPER GASTROINTESTINAL ENDOSCOPY    . VAGINAL HYSTERECTOMY  age 51   partial with the bladder tack    SOCIAL HISTORY: Social History   Social History  . Marital status: Married    Spouse name: N/A  . Number of children: N/A  . Years of education: N/A   Occupational History  . Not on file.   Social History Main Topics  . Smoking status: Never Smoker  . Smokeless tobacco: Never Used  . Alcohol use No  . Drug use: No  . Sexual activity: Not on file   Other Topics  Concern  . Not on file   Social History Narrative   Married, husband is disabled (physical and dementia) and she is caregiver   Has #1 child (daughter) living. Son died of MI at age 35   Worked at [employer] before she retired   Has #3 cats    FAMILY HISTORY: Family History  Problem Relation Age of Onset  . Colon cancer Paternal Grandfather 7    colon cancer   . Stroke Father   . Heart attack Son 41    died of heart attack   . Esophageal cancer Neg Hx   . Rectal cancer Neg Hx   . Stomach cancer Neg Hx     ALLERGIES:  is allergic to sulfa antibiotics; amoxicillin-pot clavulanate; and penicillins.  MEDICATIONS:  Current Outpatient Prescriptions  Medication Sig Dispense Refill  . alendronate (FOSAMAX) 70 MG tablet Take 70 mg by mouth every 7 (seven) days. Sundays. Take with a full glass of water on an empty stomach.    Marland Kitchen amLODipine (NORVASC) 5 MG tablet Take 1 tablet (5 mg total) by mouth daily. 30 tablet 1  . Ascorbic Acid (VITAMIN C) 1000 MG tablet Take 1,000 mg by mouth daily.    . Biotin 1000 MCG tablet Take 1,000 mcg by mouth daily.     Marland Kitchen CALCIUM & MAGNESIUM CARBONATES PO Take 1 tablet by mouth daily.    . cholecalciferol (VITAMIN D) 1000 UNITS tablet Take 1,000 Units by mouth daily.    . diphenoxylate-atropine (LOMOTIL) 2.5-0.025 MG tablet Take 1-2 tablets by mouth 4 (four) times daily as needed for diarrhea or loose stools. 30 tablet 2  . glucosamine-chondroitin 500-400 MG tablet Take 1 tablet by mouth daily.     . Homeopathic Products ([employer] LEG CRAMPS PAIN RELIEF PO) Take 1 tablet by mouth daily as needed (for leg cramps).    Marland Kitchen ibuprofen (ADVIL,MOTRIN) 200 MG tablet Take 200 mg by mouth daily as needed for moderate pain.     Marland Kitchen lidocaine-prilocaine (EMLA) cream Apply to affected area once 30 g 3  . loratadine (CLARITIN) 10 MG tablet Take 10 mg by mouth daily.    . Multiple Vitamin (MULTIVITAMIN) tablet Take 1 tablet by mouth daily.    . ondansetron (ZOFRAN) 8 MG  tablet Take 1 tablet (8 mg total) by mouth every 8 (eight) hours as needed (Nausea or vomiting). 30 tablet 1  . potassium chloride SA (K-DUR,KLOR-CON) 20 MEQ  tablet Take 1 tablet (20 mEq total) by mouth daily. (Patient taking differently: Take 20 mEq by mouth every other day. ) 20 tablet 1  . prochlorperazine (COMPAZINE) 10 MG tablet Take 1 tablet (10 mg total) by mouth every 8 (eight) hours as needed for nausea (Nausea or vomiting). 30 tablet 0  . ranitidine (ZANTAC) 150 MG tablet Take 150 mg by mouth as needed for heartburn.    . traMADol (ULTRAM) 50 MG tablet Take 1 tablet (50 mg total) by mouth every 6 (six) hours as needed. 30 tablet 1  . urea (CARMOL) 10 % cream Apply topically 2 (two) times daily. Apply to hand twice daily 142 g 0  . vitamin B-12 (CYANOCOBALAMIN) 1000 MCG tablet Take 1,000 mcg by mouth daily.      No current facility-administered medications for this visit.     REVIEW OF SYSTEMS: Constitutional: Denies fevers, chills or abnormal night sweats (+) improved energy, good appetite, dry skin Eyes: Denies blurriness of vision, double vision or watery eyes Ears, nose, mouth, throat, and face: Denies mucositis or sore throat Respiratory: Denies cough, dyspnea or wheezes Cardiovascular: Denies palpitation, chest discomfort or lower extremity swelling Gastrointestinal:  Denies nausea, heartburn or change in bowel habits (+) frequent bowel movements,  Skin: Denies abnormal skin rashes Lymphatics: Denies new lymphadenopathy or easy bruising Neurological:Denies numbness, or new weaknesses (+) tingling  Behavioral/Psych: Mood is stable, no new changes  All other systems were reviewed with the patient and are negative.   PHYSICAL EXAMINATION:  ECOG PERFORMANCE STATUS: 1  Vitals:   05/17/16 1024  BP: (!) 152/84  Pulse: 69  Resp: 18  Temp: 98.1 F (36.7 C)   Filed Weights   05/17/16 1024  Weight: 136 lb 4.8 oz (61.8 kg)    GENERAL:alert, no distress and comfortable SKIN:  skin color, texture, turgor are normal, no rashes or significant lesions  EYES: normal, conjunctiva are pink and non-injected, sclera clear OROPHARYNX:no exudate, no erythema and lips, buccal mucosa, and tongue normal  NECK: supple, thyroid normal size, non-tender, without nodularity LYMPH:  no palpable lymphadenopathy in the cervical, axillary or inguinal LUNGS: clear to auscultation and percussion with normal breathing effort HEART: regular rate & rhythm and no murmurs and no lower extremity edema ABDOMEN:abdomen soft, surgical incision and to the low abdomen is well healed. non-tender and normal bowel sounds, rectal exam showed internal and external hemorrhoids, no bleeding, stool was green. Musculoskeletal:no cyanosis of digits and no clubbing  PSYCH: alert & oriented x 3 with fluent speech NEURO: no focal motor/sensory deficits   LABORATORY DATA:  I have reviewed the data as listed CBC Latest Ref Rng & Units 05/17/2016 05/03/2016 04/19/2016  WBC 3.9 - 10.3 10e3/uL 6.5 6.5 6.2  Hemoglobin 11.6 - 15.9 g/dL 12.5 12.3 11.7  Hematocrit 34.8 - 46.6 % 36.8 37.3 35.1  Platelets 145 - 400 10e3/uL 143(L) 146 142(L)   CMP Latest Ref Rng & Units 05/17/2016 05/03/2016 04/19/2016  Glucose 70 - 140 mg/dl 99 103 109  BUN 7.0 - 26.0 mg/dL 18.4 19.3 14.7  Creatinine 0.6 - 1.1 mg/dL 0.9 0.9 0.9  Sodium 136 - 145 mEq/L 142 141 144  Potassium 3.5 - 5.1 mEq/L 4.4 4.2 3.7  Chloride 101 - 111 mmol/L - - -  CO2 22 - 29 mEq/L '25 27 26  ' Calcium 8.4 - 10.4 mg/dL 9.5 9.5 9.2  Total Protein 6.4 - 8.3 g/dL 6.8 6.9 6.5  Total Bilirubin 0.20 - 1.20 mg/dL 0.77 0.77 1.12  Alkaline  Phos 40 - 150 U/L 96 98 79  AST 5 - 34 U/L '20 22 19  ' ALT 0 - 55 U/L '11 15 8   ' CEA 05/02/2015: 2.6 08/15/2015: 9.6 12/16/2015: 253 03/08/2016: 463 04/05/16: 443.6 05/03/16: 347.82  Pathology report:  Diagnosis 11/04/2015 Peritoneum, biopsy, RLQ - METASTATIC COLORECTAL ADENOCARCINOMA. - SEE COMMENT.  Diagnosis 05/17/2014 1. Colon, segmental  resection for tumor, sigmoid, open end proximal - INVASIVE ADENOCARCINOMA WITH EXTRACELLULAR MUCIN, INVADING THROUGH THE MUSCULARIS PROPRIA INTO PERICOLONIC FATTY TISSUE. - THIRTEEN LYMPH NODES, NEGATIVE FOR METASTATIC CARCINOMA (0/13). - MULTIPLE DIVERTICULA. - RESECTION MARGINS, NEGATIVE FOR ATYPIA OR MALIGNANCY. - PLEASE SEE ONCOLOGY TEMPLATE FOR DETAILS. 2. Colon, resection margin (donut), sigmoid - BENIGN COLONIC MUCOSA, NO EVIDENCE OF DYSPLASIA OR MALIGNANCY.  Diagnosis 12/08/2014  Colon, segmental resection for tumor, right colon MODERATELY DIFFERENTIATED ADENOCARCINOMA OF THE COLON (3.8 CM) THE TUMOR PENETRATES TO THE SURFACE OF THE VISCERAL PERITONEUM (T4A) LYMPHOVASCULAR INVASION IS IDENTIFIED MARGINS OF RESECTION ARE NEGATIVE FOR TUMOR NINTEEN BENIGN LYMPH NODE (0/19) ONE TUMOR DEPOSITE IS IDENTIFIED ([place])   RADIOGRAPHIC STUDIES: I have personally reviewed the radiological images as listed and agreed with the findings in the report.  CT CAP 04/17/16 IMPRESSION: 1. Slight interval improvement in extensive peritoneal carcinomatosis compared with prior CT from 02/02/2016. No definite disease progression. 2. No evidence of thoracic or hepatic metastases. 3. No definite bowel wall invasion, ascites or bowel obstruction. 4. Cholelithiasis. 5. Stable mild pulmonary nodularity, likely benign.  CT CAP 02/02/2016 IMPRESSION: 1. Extensive worsening of peritoneal spread of tumor with numerous large new tumor nodules scattered throughout The upper omentum and also scattered within along the mesentery. Mild extension into the right retroperitoneum at the level of the iliac crest. 2. Slight reduction in size of the right upper lobe pulmonary nodule 1.1 cm, previously 1.2 cm. Stable indistinct ground-glass density nodule in the right upper lobe. 3. Airway thickening is present, suggesting bronchitis or reactive airways disease. 4. Ascending aortic aneurysm 4.0 cm in diameter.  Recommend annual imaging followup by CTA or MRA. This recommendation follows 2010 ACCF/AHA/AATS/ACR/ASA/SCA/SCAI/SIR/STS/SVM Guidelines for the Diagnosis and Management of Patients with Thoracic Aortic Disease. Circulation. 2010; 121: e266-e369 5. Cholelithiasis.   PET 10/10/2015 IMPRESSION: 1. Multiple enlarging, hypermetabolic peritoneal nodules consistent with peritoneal carcinomatosis. No generalized ascites. 2. No evidence of metastatic disease to the liver. 3. No suspicious pulmonary findings. Grossly stable chronic postinflammatory changes. 4. Multinodular goiter.  Diagnosis 11/04/2015 Peritoneum, biopsy, RLQ - METASTATIC COLORECTAL ADENOCARCINOMA. - SEE COMMENT.  ASSESSMENT & PLAN:  83 y.o. Caucasian female, with past medical history of colon diverticulosis and arthritis, but otherwise healthy and active, who was found to have a large sigmoid tumor, status post sigmoidectomy.  1. Sigmoid colon adenocarcinoma, pT3N0M0, stage II, G1, MSI stable, ascending colon adenocarcinoma, pT4aN1cM0, stage IIIB, MSI stable, G2, recurrent peritoneal carcinomatosis in 08/2015 -She had multifocal (2) colon cancer status post complete surgical resection,  -I previously reviewed her recent surgical pathology results in great details with her. The right colon cancer has several high-risk features, including stage IIIB, T4a lesion, tumor deposits, lymphovascular invasion, her risk of colon cancer recurrence is high.  -She tried adjuvant chemotherapy Xeloda, but could not tolerate full dose, and last cycle chemotherapy was canceled due to her tolerance issue. -I previously reviewed her restaging CT scan from 08/2015, which showed a 2 new peritoneal nodule, suspicious for peritoneal carcinomatosis. She is not symptomatic now. -Her to follow-up her CEA has also increased lately, concerning for cancer recurrence. -I previously  reviewed her recent PET scan from 10/10/2015, which unfortunately showed  multiple enlarging, hypermetabolic peritoneal nodules consistent with peritoneal carcinomatosis. No ascites or other metastasis. -I previously discussed her peritoneal mass biopsy, which confirmed metastatic colon cancer  -We previously discussed that her cancer is not curable at this stage, she is not a candidate for debulking and HIPEC, and her goal of care is palliation.  -Her [facility] genomic testing results were discussed with the patient. She has (+) KRAS mutation, not a candidate for EGFR inhibitor   -Her tumor has MSI stable, not a good candidate for immunotherapy alone  -She has started first-line palliative chemotherapy with 5-FU infusion and Avastin, tolerate treatment better w/o 5-fu bolus -We reviewed her staging CT scan from 04/17/2016, which showed partial response of her peritoneal metastasis, no other new lesions. -She is doing well overall, lab reviewed, adequate for treatment, we'll proceeded with 6th cycle 5-FU and Avastin -  Labs reviewed, CBC and CMP normal. Will continue chemo  - Patient going to an event out of town May 5. She would like to skip that week. We agreed to postpone for 1 week  2. Rectal bleeding and diarrhea  -she developed diarrhea and rectal bleeding after first cycle chemo, probably related to chemotherapy -Bleeding likely secondary to hemorrhoids, possibly related to Avastin. -I have encouraged her to use Imodium, I also previously gave a prescription of Lomotil for diarrhea -No significant bleeding after cycle 2 and 3 of chemo. The patient experienced some diarrhea from IV contrast following recent scan. -The patient is not taking Lomotil at this time, but I previously  encouraged her to begin using it as needed for diarrhea   3. Peripheral neuropathy, G 1 -From previous chemotherapy Xeloda, mild and stable now.  4. GERD -she'll continue Protonix.  5.Lung nodules  -She has two small right lung nodules which were discovered on the CT scan in  09/2012,  RUL nodule slightly bigger on recent CT -She has been follow-up with Dr. Cyndia Bent.  -she never smoked, low risk for lung cancer -given the stability of the RUL nodule over 2 years, this is likely a benign lesion   6. Support/social issue -Her husband's dementia has been progressing and he has become aggressive and dangerous. -I have previously encouraged her to speak to our social worker.  -We previously discussed speaking to his doctor to get medication to help control his aggressive behavior.  -Her daughter would like to put him in a dementia facility.  7. Abdominal Pain -She has developed some abdominal pain lately, likely related to diarrhea and gas.  -She will take tylenol for the pain -Previously prescribed Tramadol. Take PRN up to 3 times a day.   8. HTN -Slightly elevated today. No history of HTN, possible related to Avastin  -She will start monitoring and writing down her BP readings at home.  -She is not on BP medication. I previously prescribde Norvasc 5 mg . We discussed side effects, and the patient is in agreement.  9. Hair Loss - Patient's hair is starting to thin. I advised her that it is from her chemo and gave her the phone number to [employer] so she can get a wig.   10.Goal of care discussion  -We discussed the incurable nature of her cancer, and the overall poor prognosis, especially if she does not have good response to chemotherapy or progress on chemo -The patient understands the goal of care is palliative. -I recommend DNR/DNI, she will think about  it   Plan -Lab reviewed, adequate for treatment, she is clinically well, we'll proceed with 5-FU and Avastin today  -Lab, flush, chemo 5-fu and avastin in 2, 5 and 7 weeks  -f/u in 5 weeks   All questions were answered. The patient knows to call the clinic with any problems, questions or concerns.  I spent 20 minutes counseling the patient face to face. The total time spent in the appointment was 25   minutes and more than 50% was on counseling.  This document serves as a record of services personally performed by Truitt Merle, MD. It was created on her behalf by Brandt Loosen, a trained medical scribe. The creation of this record is based on the scribe's personal observations and the provider's statements to them. This document has been checked and approved by the attending provider.   I have reviewed the above documentation for accuracy and completeness, and I agree with the above information.      Truitt Merle  05/17/2016

## 2016-05-17 ENCOUNTER — Ambulatory Visit (HOSPITAL_BASED_OUTPATIENT_CLINIC_OR_DEPARTMENT_OTHER): Payer: PPO

## 2016-05-17 ENCOUNTER — Ambulatory Visit (HOSPITAL_BASED_OUTPATIENT_CLINIC_OR_DEPARTMENT_OTHER): Payer: PPO | Admitting: Hematology

## 2016-05-17 ENCOUNTER — Encounter: Payer: Self-pay | Admitting: Hematology

## 2016-05-17 ENCOUNTER — Other Ambulatory Visit (HOSPITAL_BASED_OUTPATIENT_CLINIC_OR_DEPARTMENT_OTHER): Payer: PPO

## 2016-05-17 VITALS — BP 152/84 | HR 69 | Temp 98.1°F | Resp 18 | Ht 63.0 in | Wt 136.3 lb

## 2016-05-17 DIAGNOSIS — C786 Secondary malignant neoplasm of retroperitoneum and peritoneum: Secondary | ICD-10-CM | POA: Diagnosis not present

## 2016-05-17 DIAGNOSIS — Z5111 Encounter for antineoplastic chemotherapy: Secondary | ICD-10-CM

## 2016-05-17 DIAGNOSIS — C182 Malignant neoplasm of ascending colon: Secondary | ICD-10-CM

## 2016-05-17 DIAGNOSIS — Z5112 Encounter for antineoplastic immunotherapy: Secondary | ICD-10-CM | POA: Diagnosis not present

## 2016-05-17 DIAGNOSIS — C186 Malignant neoplasm of descending colon: Secondary | ICD-10-CM | POA: Diagnosis not present

## 2016-05-17 DIAGNOSIS — L658 Other specified nonscarring hair loss: Secondary | ICD-10-CM

## 2016-05-17 DIAGNOSIS — C187 Malignant neoplasm of sigmoid colon: Secondary | ICD-10-CM | POA: Diagnosis not present

## 2016-05-17 DIAGNOSIS — R109 Unspecified abdominal pain: Secondary | ICD-10-CM | POA: Diagnosis not present

## 2016-05-17 DIAGNOSIS — G620 Drug-induced polyneuropathy: Secondary | ICD-10-CM | POA: Diagnosis not present

## 2016-05-17 DIAGNOSIS — Z95828 Presence of other vascular implants and grafts: Secondary | ICD-10-CM

## 2016-05-17 LAB — CBC WITH DIFFERENTIAL/PLATELET
BASO%: 1.1 % (ref 0.0–2.0)
BASOS ABS: 0.1 10*3/uL (ref 0.0–0.1)
EOS%: 3.3 % (ref 0.0–7.0)
Eosinophils Absolute: 0.2 10*3/uL (ref 0.0–0.5)
HEMATOCRIT: 36.8 % (ref 34.8–46.6)
HGB: 12.5 g/dL (ref 11.6–15.9)
LYMPH#: 0.6 10*3/uL — AB (ref 0.9–3.3)
LYMPH%: 9.6 % — ABNORMAL LOW (ref 14.0–49.7)
MCH: 32.3 pg (ref 25.1–34.0)
MCHC: 34 g/dL (ref 31.5–36.0)
MCV: 95.1 fL (ref 79.5–101.0)
MONO#: 0.6 10*3/uL (ref 0.1–0.9)
MONO%: 8.7 % (ref 0.0–14.0)
NEUT#: 5 10*3/uL (ref 1.5–6.5)
NEUT%: 77.3 % — AB (ref 38.4–76.8)
PLATELETS: 143 10*3/uL — AB (ref 145–400)
RBC: 3.87 10*6/uL (ref 3.70–5.45)
RDW: 15.7 % — ABNORMAL HIGH (ref 11.2–14.5)
WBC: 6.5 10*3/uL (ref 3.9–10.3)

## 2016-05-17 LAB — COMPREHENSIVE METABOLIC PANEL
ALBUMIN: 3.9 g/dL (ref 3.5–5.0)
ALT: 11 U/L (ref 0–55)
ANION GAP: 8 meq/L (ref 3–11)
AST: 20 U/L (ref 5–34)
Alkaline Phosphatase: 96 U/L (ref 40–150)
BUN: 18.4 mg/dL (ref 7.0–26.0)
CALCIUM: 9.5 mg/dL (ref 8.4–10.4)
CHLORIDE: 109 meq/L (ref 98–109)
CO2: 25 mEq/L (ref 22–29)
Creatinine: 0.9 mg/dL (ref 0.6–1.1)
EGFR: 63 mL/min/{1.73_m2} — ABNORMAL LOW (ref >90)
Glucose: 99 mg/dl (ref 70–140)
POTASSIUM: 4.4 meq/L (ref 3.5–5.1)
Sodium: 142 mEq/L (ref 136–145)
Total Bilirubin: 0.77 mg/dL (ref 0.20–1.20)
Total Protein: 6.8 g/dL (ref 6.4–8.3)

## 2016-05-17 LAB — URINALYSIS, MICROSCOPIC - CHCC
BLOOD: NEGATIVE
Bilirubin (Urine): NEGATIVE
GLUCOSE UR CHCC: NEGATIVE mg/dL
Ketones: NEGATIVE mg/dL
NITRITE: NEGATIVE
PH: 6 (ref 4.6–8.0)
PROTEIN: NEGATIVE mg/dL
RBC / HPF: NEGATIVE (ref 0–2)
Specific Gravity, Urine: 1.025 (ref 1.003–1.035)
UROBILINOGEN UR: 0.2 mg/dL (ref 0.2–1)

## 2016-05-17 MED ORDER — PROCHLORPERAZINE MALEATE 10 MG PO TABS
10.0000 mg | ORAL_TABLET | Freq: Once | ORAL | Status: AC
  Administered 2016-05-17: 10 mg via ORAL

## 2016-05-17 MED ORDER — SODIUM CHLORIDE 0.9 % IV SOLN
Freq: Once | INTRAVENOUS | Status: AC
  Administered 2016-05-17: 12:00:00 via INTRAVENOUS

## 2016-05-17 MED ORDER — DEXTROSE 5 % IV SOLN
400.0000 mg/m2 | Freq: Once | INTRAVENOUS | Status: AC
  Administered 2016-05-17: 676 mg via INTRAVENOUS
  Filled 2016-05-17: qty 33.8

## 2016-05-17 MED ORDER — SODIUM CHLORIDE 0.9 % IV SOLN
2400.0000 mg/m2 | INTRAVENOUS | Status: DC
  Administered 2016-05-17: 4050 mg via INTRAVENOUS
  Filled 2016-05-17: qty 81

## 2016-05-17 MED ORDER — SODIUM CHLORIDE 0.9% FLUSH
10.0000 mL | INTRAVENOUS | Status: DC | PRN
  Administered 2016-05-17: 10 mL via INTRAVENOUS
  Filled 2016-05-17: qty 10

## 2016-05-17 MED ORDER — PROCHLORPERAZINE MALEATE 10 MG PO TABS
ORAL_TABLET | ORAL | Status: AC
  Filled 2016-05-17: qty 1

## 2016-05-17 MED ORDER — SODIUM CHLORIDE 0.9 % IV SOLN: Freq: Once | INTRAVENOUS | Status: DC

## 2016-05-17 MED ORDER — SODIUM CHLORIDE 0.9 % IV SOLN
5.0000 mg/kg | Freq: Once | INTRAVENOUS | Status: AC
  Administered 2016-05-17: 325 mg via INTRAVENOUS
  Filled 2016-05-17: qty 13

## 2016-05-17 NOTE — Patient Instructions (Signed)
[facility] Discharge Instructions for Patients Receiving Chemotherapy  Today you received the following chemotherapy agents :  Leucovorin, Fluorouracil.  To help prevent nausea and vomiting after your treatment, we encourage you to take your nausea medication as prescribed.   If you develop nausea and vomiting that is not controlled by your nausea medication, call the clinic.   BELOW ARE SYMPTOMS THAT SHOULD BE REPORTED IMMEDIATELY:  *FEVER GREATER THAN 100.5 F  *CHILLS WITH OR WITHOUT FEVER  NAUSEA AND VOMITING THAT IS NOT CONTROLLED WITH YOUR NAUSEA MEDICATION  *UNUSUAL SHORTNESS OF BREATH  *UNUSUAL BRUISING OR BLEEDING  TENDERNESS IN MOUTH AND THROAT WITH OR WITHOUT PRESENCE OF ULCERS  *URINARY PROBLEMS  *BOWEL PROBLEMS  UNUSUAL RASH Items with * indicate a potential emergency and should be followed up as soon as possible.  Feel free to call the clinic you have any questions or concerns. The clinic phone number is [phone].  Please show the CHEMO ALERT CARD at check-in to the Emergency Department and triage nurse.   

## 2016-05-19 ENCOUNTER — Ambulatory Visit (HOSPITAL_BASED_OUTPATIENT_CLINIC_OR_DEPARTMENT_OTHER): Payer: Self-pay

## 2016-05-19 VITALS — BP 139/76 | HR 83 | Temp 97.6°F | Resp 20

## 2016-05-19 DIAGNOSIS — C186 Malignant neoplasm of descending colon: Secondary | ICD-10-CM

## 2016-05-19 DIAGNOSIS — C182 Malignant neoplasm of ascending colon: Secondary | ICD-10-CM

## 2016-05-19 MED ORDER — SODIUM CHLORIDE 0.9% FLUSH
10.0000 mL | INTRAVENOUS | Status: DC | PRN
  Administered 2016-05-19: 10 mL
  Filled 2016-05-19: qty 10

## 2016-05-19 MED ORDER — HEPARIN SOD (PORK) LOCK FLUSH 100 UNIT/ML IV SOLN
500.0000 [IU] | Freq: Once | INTRAVENOUS | Status: AC | PRN
  Administered 2016-05-19: 500 [IU]
  Filled 2016-05-19: qty 5

## 2016-05-19 NOTE — Addendum Note (Signed)
Addended by: Truitt Merle on: 05/19/2016 06:26 PM   Modules accepted: Orders

## 2016-05-19 NOTE — Patient Instructions (Signed)
Implanted Port Home Guide An implanted port is a type of central line that is placed under the skin. Central lines are used to provide IV access when treatment or nutrition needs to be given through a person's veins. Implanted ports are used for long-term IV access. An implanted port may be placed because:  You need IV medicine that would be irritating to the small veins in your hands or arms.  You need long-term IV medicines, such as antibiotics.  You need IV nutrition for a long period.  You need frequent blood draws for lab tests.  You need dialysis.  Implanted ports are usually placed in the chest area, but they can also be placed in the upper arm, the abdomen, or the leg. An implanted port has two main parts:  Reservoir. The reservoir is round and will appear as a small, raised area under your skin. The reservoir is the part where a needle is inserted to give medicines or draw blood.  Catheter. The catheter is a thin, flexible tube that extends from the reservoir. The catheter is placed into a large vein. Medicine that is inserted into the reservoir goes into the catheter and then into the vein.  How will I care for my incision site? Do not get the incision site wet. Bathe or shower as directed by your health care provider. How is my port accessed? Special steps must be taken to access the port:  Before the port is accessed, a numbing cream can be placed on the skin. This helps numb the skin over the port site.  Your health care provider uses a sterile technique to access the port. ? Your health care provider must put on a mask and sterile gloves. ? The skin over your port is cleaned carefully with an antiseptic and allowed to dry. ? The port is gently pinched between sterile gloves, and a needle is inserted into the port.  Only "non-coring" port needles should be used to access the port. Once the port is accessed, a blood return should be checked. This helps ensure that the port  is in the vein and is not clogged.  If your port needs to remain accessed for a constant infusion, a clear (transparent) bandage will be placed over the needle site. The bandage and needle will need to be changed every week, or as directed by your health care provider.  Keep the bandage covering the needle clean and dry. Do not get it wet. Follow your health care provider's instructions on how to take a shower or bath while the port is accessed.  If your port does not need to stay accessed, no bandage is needed over the port.  What is flushing? Flushing helps keep the port from getting clogged. Follow your health care provider's instructions on how and when to flush the port. Ports are usually flushed with saline solution or a medicine called heparin. The need for flushing will depend on how the port is used.  If the port is used for intermittent medicines or blood draws, the port will need to be flushed: ? After medicines have been given. ? After blood has been drawn. ? As part of routine maintenance.  If a constant infusion is running, the port may not need to be flushed.  How long will my port stay implanted? The port can stay in for as long as your health care provider thinks it is needed. When it is time for the port to come out, surgery will be   done to remove it. The procedure is similar to the one performed when the port was put in. When should I seek immediate medical care? When you have an implanted port, you should seek immediate medical care if:  You notice a bad smell coming from the incision site.  You have swelling, redness, or drainage at the incision site.  You have more swelling or pain at the port site or the surrounding area.  You have a fever that is not controlled with medicine.  This information is not intended to replace advice given to you by your health care provider. Make sure you discuss any questions you have with your health care provider. Document  Released: 01/29/2005 Document Revised: 07/07/2015 Document Reviewed: 10/06/2012 [employer] Interactive Patient Education  2017 [employer]  

## 2016-05-25 ENCOUNTER — Telehealth: Payer: Self-pay | Admitting: Hematology

## 2016-05-25 NOTE — Telephone Encounter (Signed)
Lvm advising appt 4/19. Asked pt to get new calendar then.

## 2016-05-31 ENCOUNTER — Ambulatory Visit (HOSPITAL_BASED_OUTPATIENT_CLINIC_OR_DEPARTMENT_OTHER): Payer: PPO

## 2016-05-31 ENCOUNTER — Other Ambulatory Visit (HOSPITAL_BASED_OUTPATIENT_CLINIC_OR_DEPARTMENT_OTHER): Payer: PPO

## 2016-05-31 VITALS — BP 150/68 | HR 70 | Temp 98.0°F | Resp 20

## 2016-05-31 DIAGNOSIS — C182 Malignant neoplasm of ascending colon: Secondary | ICD-10-CM

## 2016-05-31 DIAGNOSIS — Z5112 Encounter for antineoplastic immunotherapy: Secondary | ICD-10-CM

## 2016-05-31 DIAGNOSIS — C186 Malignant neoplasm of descending colon: Secondary | ICD-10-CM

## 2016-05-31 DIAGNOSIS — Z95828 Presence of other vascular implants and grafts: Secondary | ICD-10-CM

## 2016-05-31 DIAGNOSIS — Z5111 Encounter for antineoplastic chemotherapy: Secondary | ICD-10-CM

## 2016-05-31 LAB — COMPREHENSIVE METABOLIC PANEL
ALT: 9 U/L (ref 0–55)
ANION GAP: 8 meq/L (ref 3–11)
AST: 18 U/L (ref 5–34)
Albumin: 3.8 g/dL (ref 3.5–5.0)
Alkaline Phosphatase: 85 U/L (ref 40–150)
BUN: 26.6 mg/dL — ABNORMAL HIGH (ref 7.0–26.0)
CHLORIDE: 110 meq/L — AB (ref 98–109)
CO2: 26 meq/L (ref 22–29)
CREATININE: 0.9 mg/dL (ref 0.6–1.1)
Calcium: 9.6 mg/dL (ref 8.4–10.4)
EGFR: 60 mL/min/{1.73_m2} — ABNORMAL LOW (ref >90)
Glucose: 114 mg/dl (ref 70–140)
Potassium: 3.9 mEq/L (ref 3.5–5.1)
Sodium: 144 mEq/L (ref 136–145)
Total Bilirubin: 0.74 mg/dL (ref 0.20–1.20)
Total Protein: 6.6 g/dL (ref 6.4–8.3)

## 2016-05-31 LAB — CBC WITH DIFFERENTIAL/PLATELET
BASO%: 1.1 % (ref 0.0–2.0)
Basophils Absolute: 0.1 10*3/uL (ref 0.0–0.1)
EOS%: 3.6 % (ref 0.0–7.0)
Eosinophils Absolute: 0.2 10*3/uL (ref 0.0–0.5)
HCT: 36.9 % (ref 34.8–46.6)
HGB: 12.4 g/dL (ref 11.6–15.9)
LYMPH#: 0.7 10*3/uL — AB (ref 0.9–3.3)
LYMPH%: 13.9 % — AB (ref 14.0–49.7)
MCH: 32.1 pg (ref 25.1–34.0)
MCHC: 33.7 g/dL (ref 31.5–36.0)
MCV: 95.5 fL (ref 79.5–101.0)
MONO#: 0.5 10*3/uL (ref 0.1–0.9)
MONO%: 9.4 % (ref 0.0–14.0)
NEUT#: 3.8 10*3/uL (ref 1.5–6.5)
NEUT%: 72 % (ref 38.4–76.8)
PLATELETS: 139 10*3/uL — AB (ref 145–400)
RBC: 3.87 10*6/uL (ref 3.70–5.45)
RDW: 15.4 % — ABNORMAL HIGH (ref 11.2–14.5)
WBC: 5.4 10*3/uL (ref 3.9–10.3)

## 2016-05-31 LAB — UA PROTEIN, DIPSTICK - CHCC: Protein, ur: NEGATIVE mg/dL

## 2016-05-31 LAB — CEA (IN HOUSE-CHCC): CEA (CHCC-IN HOUSE): 311.27 ng/mL — AB (ref 0.00–5.00)

## 2016-05-31 MED ORDER — SODIUM CHLORIDE 0.9 % IV SOLN
Freq: Once | INTRAVENOUS | Status: AC
  Administered 2016-05-31: 13:00:00 via INTRAVENOUS

## 2016-05-31 MED ORDER — PROCHLORPERAZINE MALEATE 10 MG PO TABS
ORAL_TABLET | ORAL | Status: AC
  Filled 2016-05-31: qty 1

## 2016-05-31 MED ORDER — SODIUM CHLORIDE 0.9 % IV SOLN
2400.0000 mg/m2 | INTRAVENOUS | Status: DC
  Administered 2016-05-31: 4050 mg via INTRAVENOUS
  Filled 2016-05-31: qty 81

## 2016-05-31 MED ORDER — PROCHLORPERAZINE MALEATE 10 MG PO TABS
10.0000 mg | ORAL_TABLET | Freq: Once | ORAL | Status: AC
  Administered 2016-05-31: 10 mg via ORAL

## 2016-05-31 MED ORDER — BEVACIZUMAB CHEMO INJECTION 400 MG/16ML
5.0000 mg/kg | Freq: Once | INTRAVENOUS | Status: AC
  Administered 2016-05-31: 325 mg via INTRAVENOUS
  Filled 2016-05-31: qty 13

## 2016-05-31 MED ORDER — HEPARIN SOD (PORK) LOCK FLUSH 100 UNIT/ML IV SOLN
500.0000 [IU] | Freq: Once | INTRAVENOUS | Status: DC | PRN
  Filled 2016-05-31: qty 5

## 2016-05-31 MED ORDER — SODIUM CHLORIDE 0.9% FLUSH
10.0000 mL | INTRAVENOUS | Status: DC | PRN
  Administered 2016-05-31: 10 mL via INTRAVENOUS
  Filled 2016-05-31: qty 10

## 2016-05-31 MED ORDER — LEUCOVORIN CALCIUM INJECTION 350 MG
400.0000 mg/m2 | Freq: Once | INTRAVENOUS | Status: AC
  Administered 2016-05-31: 676 mg via INTRAVENOUS
  Filled 2016-05-31: qty 33.8

## 2016-05-31 NOTE — Patient Instructions (Signed)

## 2016-05-31 NOTE — Patient Instructions (Signed)
Cancer Center Discharge Instructions for Patients Receiving Chemotherapy  Today you received the following chemotherapy agents avastin/leucovorin/florouracil   To help prevent nausea and vomiting after your treatment, we encourage you to take your nausea medication as directed  If you develop nausea and vomiting that is not controlled by your nausea medication, call the clinic.   BELOW ARE SYMPTOMS THAT SHOULD BE REPORTED IMMEDIATELY:  *FEVER GREATER THAN 100.5 F  *CHILLS WITH OR WITHOUT FEVER  NAUSEA AND VOMITING THAT IS NOT CONTROLLED WITH YOUR NAUSEA MEDICATION  *UNUSUAL SHORTNESS OF BREATH  *UNUSUAL BRUISING OR BLEEDING  TENDERNESS IN MOUTH AND THROAT WITH OR WITHOUT PRESENCE OF ULCERS  *URINARY PROBLEMS  *BOWEL PROBLEMS  UNUSUAL RASH Items with * indicate a potential emergency and should be followed up as soon as possible.  Feel free to call the clinic you have any questions or concerns. The clinic phone number is [phone].  

## 2016-06-02 ENCOUNTER — Ambulatory Visit (HOSPITAL_BASED_OUTPATIENT_CLINIC_OR_DEPARTMENT_OTHER): Payer: PPO

## 2016-06-02 VITALS — BP 140/76 | HR 77 | Temp 98.9°F | Resp 18

## 2016-06-02 DIAGNOSIS — C182 Malignant neoplasm of ascending colon: Secondary | ICD-10-CM | POA: Diagnosis not present

## 2016-06-02 DIAGNOSIS — C186 Malignant neoplasm of descending colon: Secondary | ICD-10-CM | POA: Diagnosis not present

## 2016-06-02 MED ORDER — SODIUM CHLORIDE 0.9% FLUSH
10.0000 mL | INTRAVENOUS | Status: DC | PRN
  Administered 2016-06-02: 10 mL
  Filled 2016-06-02: qty 10

## 2016-06-02 MED ORDER — HEPARIN SOD (PORK) LOCK FLUSH 100 UNIT/ML IV SOLN
500.0000 [IU] | Freq: Once | INTRAVENOUS | Status: AC | PRN
  Administered 2016-06-02: 500 [IU]
  Filled 2016-06-02: qty 5

## 2016-06-08 DIAGNOSIS — C186 Malignant neoplasm of descending colon: Secondary | ICD-10-CM | POA: Diagnosis not present

## 2016-06-19 NOTE — Progress Notes (Signed)
- [place]  [phone] [phone]  Clinic Follow Up Note   Patient Care Team: Prince Solian, MD as PCP - General (Internal Medicine) Gaye Pollack, MD as Consulting Physician (Cardiothoracic Surgery) Gatha Mayer, MD as Consulting Physician (Gastroenterology) Leighton Ruff, MD as Consulting Physician (General Surgery) Tania Ade, RN as Registered Nurse (Medical Oncology) 06/21/2016   CHIEF COMPLAINTS:  Follow-up of colon cancer  Oncology History   Cancer of left colon [facility])   Staging form: Colon and Rectum, AJCC 7th Edition     Pathologic stage from 05/17/2014: Stage IIA (T3, N0, cM0) - Signed by Truitt Merle, MD on 02/21/2015 Cancer of right colon [facility])   Staging form: Colon and Rectum, AJCC 7th Edition     Pathologic stage from 12/08/2014: Stage IIIB (T4a, N1a, cM0) - Signed by Truitt Merle, MD on 02/21/2015       Cancer of right colon ([place])   04/09/2014 Procedure    Colonoscopy per Dr. Silvano Rusk: Large fleshy mass sigmoid colon 25 cm from anus. Could not pass through.      04/09/2014 Tumor Marker    CEA =11.7      04/12/2014 Imaging    CT C/A/P:sigmoid colon mass;several small areas of soft tissue nodularity in peritoneal cavity; small attenuation structure posterior right hepatic lobe; 2 stable nodules in lungs dating back to 2014. No acute findings.      05/17/2014 Initial Diagnosis    Colon cancer-presented with heme + stool and more frequent BMs      05/17/2014 Surgery    Robot assisted sigmoidectomy      05/17/2014 Pathologic Stage    pT3,pN0,pMX--Grade 1--0/13 nodes +--MMR normal      05/17/2014 Clinical Stage    Stage II      12/08/2014 Surgery    right hemicolectomy       12/08/2014 Pathology Results    right colon adenocarcinoma, pT4aN1c, 19 nodes were negative, LVI(+), MMR normal       01/17/2015 - 06/27/2015 Chemotherapy    adjuvant capecitabine 1565m q12h, 2 weeks on and 1 week off. dose reduction and  stopped after 6 cycles due to poor tolerance,       08/15/2015 Progression    CT abdomen showed peritoneal nodules, highly suspicious for recurrent metastasis      10/10/2015 Imaging    PET scan showed multiple enlarging, hypermetabolic peritoneal nodules consistent with peritoneal carcinomatosis. No other evidence of metastasis      11/04/2015 Pathology Results    Diagnosis 11/04/2015 Peritoneum, biopsy, RLQ - METASTATIC COLORECTAL ADENOCARCINOMA. - SEE COMMENT. -[facility]      02/02/2016 Imaging    CT CAP W CONTRAST 02/02/2016 IMPRESSION: 1. Extensive worsening of peritoneal spread of tumor with numerous large new tumor nodules scattered throughout The upper omentum and also scattered within along the mesentery. Mild extension into the right retroperitoneum at the level of the iliac crest. 2. Slight reduction in size of the right upper lobe pulmonary nodule 1.1 cm, previously 1.2 cm. Stable indistinct ground-glass density nodule in the right upper lobe. 3. Airway thickening is present, suggesting bronchitis or reactive airways disease. 4. Ascending aortic aneurysm 4.0 cm in diameter. Recommend annual imaging followup by CTA or MRA. This recommendation follows 2010 ACCF/AHA/AATS/ACR/ASA/SCA/SCAI/SIR/STS/SVM Guidelines for the Diagnosis and Management of Patients with Thoracic Aortic Disease. Circulation. 2010; 121: e266-e369 5. Cholelithiasis.      02/08/2016 -  Chemotherapy    5-fu infusion and Avastin every 2 weeks  04/17/2016 Imaging    CT CAP  IMPRESSION: 1. Slight interval improvement in extensive peritoneal carcinomatosis compared with prior CT from 02/02/2016. No definite disease progression. 2. No evidence of thoracic or hepatic metastases. 3. No definite bowel wall invasion, ascites or bowel obstruction. 4. Cholelithiasis. 5. Stable mild pulmonary nodularity, likely benign.       HISTORY OF PRESENTING ILLNESS:  Heidi Marquez 83 y.o. female  is here because of recently diagnosed a stage II sigmoid colon cancer.  She had annual physical with her PCP in early Feb this year, and stool OB (+), no overt GI bleeding, CBC was normal. She had noticed some loose BM before that, but no nausea, loss of appetite, weight loss or other symoptoms. Colonoscopy showed a large mass in the sigmoid colon 25 cm from anus. Biopsy was taken which showed tubulovillous adenoma, no malignancy. She was referred to surgeon Dr. Marcello Moores and underwent robotic-assisted sigmoidectomy on 4/4. She tolerated the procedure well, and was discharged home afterwards.  She has recovered well from the surgery 3 weeks ago, she has mild fatigue, but otherwise doing very well without other complains. She had 2-3 episodes of bleeding after the surgery, which was related to her herromods. She is eating well. 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   She has chronic knee pain she takes ibuprofen once daily.    CURRENT THERAPY: chemotherapy with 5-Fu and Avastin every 2 weeks, started on 02/08/2016  INTERIM HISTORY:  Mrs. Sedlack returns for follow-up. She presents to the clinic today with her friend. She reports she did have a trip planned last week but did not get to go because her husband ws in the hospital due to CHF and COPD. She feels the increase in the week before another treatment allows her to recover better. She denies irregular BM or nausea. She says her energy is still the same. Her BP was high and would like a refill of her BP medication. At home it is 120/70 usually. It is higher here. She is fine with continuing treatment.    MEDICAL HISTORY:  Past Medical History:  Diagnosis Date  . Adenocarcinoma of colon [facility]) oncologist-  dr Truitt Merle---  last chemo 01-17-2015 to 06-27-2015)   dx 05-17-2014 sigmoid (left) cancer, Stage 2, G1 (pT3N0M0), MSI stable -- s/p  sigmoidectomy /  dx 12-08-2014 ascending colon cancer (right) ,  Stage 3B, G2, (pT4aN1cM0), MSI stable -- s/p  right hemicolectomy/  08-2015  recurrent peritoneal carcinomatosis  . Arthritis    knees  . Coronary atherosclerosis   . Diverticulosis of colon   . GERD (gastroesophageal reflux disease)   . HH (hiatus hernia)   . History of basal cell carcinoma excision    right side nose 2013  s/p  moh's  . History of epistaxis    2005  caurterized twice  . History of esophageal dilatation    2009 for stricture  . History of esophagitis    reflux  . Internal hemorrhoids   . Macular degeneration of both eyes   . Multinodular thyroid    remote hx benign bx 2005  . Peritoneal carcinomatosis ([place])    secondary to primary colon cancer  . Pulmonary nodule, right    noted since 2014-- stable per last ct 06-15-2015  . Thoracic ascending aortic aneurysm [facility])    followed by dr Cyndia Bent ([facility])  last ct 06-15-2015  4.3cm    SURGICAL HISTORY: Past Surgical History:  Procedure Laterality Date  . COLONOSCOPY WITH PROPOFOL N/A 09/30/2014   Procedure: COLONOSCOPY WITH PROPOFOL;  Surgeon: Milus Banister, MD;  Location: [facility];  Service: Endoscopy;  Laterality: N/A;  . ESOPHAGOGASTRODUODENOSCOPY  2009  . INGUINAL HERNIA REPAIR Right early 1990s  . MOHS SURGERY  11/2011   nose  . PORTACATH PLACEMENT Right 01/24/2016   Procedure: INSERTION PORT-A-CATH;  Surgeon: Leighton Ruff, MD;  Location[facility];  Service: General;  Laterality: Right;  . ROBOTIC ASSISTED RIGHT HEMICOLECTOMY  02/63/7858    dr Leighton Ruff  . ROBOTIC ASSISTED SIGMOIDECTOMY  85/03/7739   dr Leighton Ruff  . UPPER GASTROINTESTINAL ENDOSCOPY    . VAGINAL HYSTERECTOMY  age 67   partial with the bladder tack    SOCIAL HISTORY: Social History   Social History  . Marital status: Married    Spouse name: N/A  . Number of children: N/A  . Years of education: N/A   Occupational History  . Not on file.   Social History Main Topics  . Smoking status: Never Smoker  .  Smokeless tobacco: Never Used  . Alcohol use No  . Drug use: No  . Sexual activity: Not on file   Other Topics Concern  . Not on file   Social History Narrative   Married, husband is disabled (physical and dementia) and she is caregiver   Has #1 child (daughter) living. Son died of MI at age 39   Worked at [employer] before she retired   Has #3 cats    FAMILY HISTORY: Family History  Problem Relation Age of Onset  . Colon cancer Paternal Grandfather 27       colon cancer   . Stroke Father   . Heart attack Son 18       died of heart attack   . Esophageal cancer Neg Hx   . Rectal cancer Neg Hx   . Stomach cancer Neg Hx     ALLERGIES:  is allergic to sulfa antibiotics; amoxicillin-pot clavulanate; and penicillins.  MEDICATIONS:  Current Outpatient Prescriptions  Medication Sig Dispense Refill  . alendronate (FOSAMAX) 70 MG tablet Take 70 mg by mouth every 7 (seven) days. Sundays. Take with a full glass of water on an empty stomach.    . Ascorbic Acid (VITAMIN C) 1000 MG tablet Take 1,000 mg by mouth daily.    . Biotin 1000 MCG tablet Take 1,000 mcg by mouth daily.     Marland Kitchen CALCIUM & MAGNESIUM CARBONATES PO Take 1 tablet by mouth daily.    . cholecalciferol (VITAMIN D) 1000 UNITS tablet Take 1,000 Units by mouth daily.    . diphenoxylate-atropine (LOMOTIL) 2.5-0.025 MG tablet Take 1-2 tablets by mouth 4 (four) times daily as needed for diarrhea or loose stools. 30 tablet 2  . glucosamine-chondroitin 500-400 MG tablet Take 1 tablet by mouth daily.     . Homeopathic Products ([employer] LEG CRAMPS PAIN RELIEF PO) Take 1 tablet by mouth daily as needed (for leg cramps).    Marland Kitchen ibuprofen (ADVIL,MOTRIN) 200 MG tablet Take 200 mg by mouth daily as needed for moderate pain.     Marland Kitchen lidocaine-prilocaine (EMLA) cream Apply to affected area once 30 g 3  . loratadine (CLARITIN) 10 MG tablet Take 10 mg by mouth daily.    . Multiple Vitamin (MULTIVITAMIN) tablet Take 1 tablet by mouth daily.     . potassium chloride SA (K-DUR,KLOR-CON) 20 MEQ tablet Take 1 tablet (20 mEq total) by mouth daily. (  Patient taking differently: Take 20 mEq by mouth every other day. ) 20 tablet 1  . ranitidine (ZANTAC) 150 MG tablet Take 150 mg by mouth as needed for heartburn.    . traMADol (ULTRAM) 50 MG tablet Take 1 tablet (50 mg total) by mouth every 6 (six) hours as needed. 30 tablet 1  . amLODipine (NORVASC) 5 MG tablet Take 1 tablet (5 mg total) by mouth daily. 30 tablet 2  . ondansetron (ZOFRAN) 8 MG tablet Take 1 tablet (8 mg total) by mouth every 8 (eight) hours as needed (Nausea or vomiting). (Patient not taking: Reported on 06/21/2016) 30 tablet 1  . prochlorperazine (COMPAZINE) 10 MG tablet Take 1 tablet (10 mg total) by mouth every 8 (eight) hours as needed for nausea (Nausea or vomiting). (Patient not taking: Reported on 06/21/2016) 30 tablet 0  . urea (CARMOL) 10 % cream Apply topically 2 (two) times daily. Apply to hand twice daily (Patient not taking: Reported on 06/21/2016) 142 g 0  . vitamin B-12 (CYANOCOBALAMIN) 1000 MCG tablet Take 1,000 mcg by mouth daily.      No current facility-administered medications for this visit.    Facility-Administered Medications Ordered in Other Visits  Medication Dose Route Frequency Provider Last Rate Last Dose  . bevacizumab (AVASTIN) 300 mg in sodium chloride 0.9 % 100 mL chemo infusion  4.8 mg/kg (Treatment Plan Recorded) Intravenous Once Truitt Merle, MD 672 mL/hr at 06/21/16 1110 300 mg at 06/21/16 1110  . fluorouracil (ADRUCIL) 4,050 mg in sodium chloride 0.9 % 69 mL chemo infusion  2,400 mg/m2 (Treatment Plan Recorded) Intravenous 1 day or 1 dose Truitt Merle, MD      . leucovorin 676 mg in dextrose 5 % 250 mL infusion  400 mg/m2 (Treatment Plan Recorded) Intravenous Once Truitt Merle, MD        REVIEW OF SYSTEMS:  Constitutional: Denies fevers, chills or abnormal night sweats (+) improved energy, good appetite, dry skin Eyes: Denies blurriness of vision,  double vision or watery eyes Ears, nose, mouth, throat, and face: Denies mucositis or sore throat Respiratory: Denies cough, dyspnea or wheezes Cardiovascular: Denies palpitation, chest discomfort or lower extremity swelling Gastrointestinal:  Denies nausea, heartburn or change in bowel habits (+) frequent bowel movements,  Skin: Denies abnormal skin rashes Lymphatics: Denies new lymphadenopathy or easy bruising Neurological:Denies numbness, or new weaknesses (+) tingling  Behavioral/Psych: Mood is stable, no new changes  All other systems were reviewed with the patient and are negative.   PHYSICAL EXAMINATION:  ECOG PERFORMANCE STATUS: 1  Vitals:   06/21/16 0940  BP: (!) 172/79  Pulse: 65  Resp: 18  Temp: 98.4 F (36.9 C)   Filed Weights   06/21/16 0940  Weight: 137 lb 3.2 oz (62.2 kg)     GENERAL:alert, no distress and comfortable SKIN: skin color, texture, turgor are normal, no rashes or significant lesions  EYES: normal, conjunctiva are pink and non-injected, sclera clear OROPHARYNX:no exudate, no erythema and lips, buccal mucosa, and tongue normal  NECK: supple, thyroid normal size, non-tender, without nodularity LYMPH:  no palpable lymphadenopathy in the cervical, axillary or inguinal LUNGS: clear to auscultation and percussion with normal breathing effort HEART: regular rate & rhythm and no murmurs and no lower extremity edema ABDOMEN:abdomen soft, surgical incision and to the low abdomen is well healed. non-tender and normal bowel sounds, rectal exam showed internal and external hemorrhoids, no bleeding, stool was green. Musculoskeletal:no cyanosis of digits and no clubbing  PSYCH: alert & oriented x  3 with fluent speech NEURO: no focal motor/sensory deficits   LABORATORY DATA:  I have reviewed the data as listed CBC Latest Ref Rng & Units 06/21/2016 05/31/2016 05/17/2016  WBC 3.9 - 10.3 10e3/uL 4.7 5.4 6.5  Hemoglobin 11.6 - 15.9 g/dL 12.1 12.4 12.5  Hematocrit 34.8  - 46.6 % 36.8 36.9 36.8  Platelets 145 - 400 10e3/uL 127(L) 139(L) 143(L)   CMP Latest Ref Rng & Units 06/21/2016 05/31/2016 05/17/2016  Glucose 70 - 140 mg/dl 98 114 99  BUN 7.0 - 26.0 mg/dL 17.4 26.6(H) 18.4  Creatinine 0.6 - 1.1 mg/dL 0.9 0.9 0.9  Sodium 136 - 145 mEq/L 144 144 142  Potassium 3.5 - 5.1 mEq/L 4.0 3.9 4.4  Chloride 101 - 111 mmol/L - - -  CO2 22 - 29 mEq/L _0 Calcium 8.4 - 10.4 mg/dL 9.3 9.6 9.5  Total Protein 6.4 - 8.3 g/dL 6.5 6.6 6.8  Total Bilirubin 0.20 - 1.20 mg/dL 0.67 0.74 0.77  Alkaline Phos 40 - 150 U/L 91 85 96  AST 5 - 34 U/L _1 ALT 0 - 55 U/L _2 CEA 05/02/2015: 2.6 08/15/2015: 9.6 12/16/2015: 253 03/08/2016: 463 04/05/16: 443.6 05/03/16: 347.82 05/31/16: 311.27  Pathology report:  Diagnosis 11/04/2015 Peritoneum, biopsy, RLQ - METASTATIC COLORECTAL ADENOCARCINOMA. - SEE COMMENT.  Diagnosis 05/17/2014 1. Colon, segmental resection for tumor, sigmoid, open end proximal - INVASIVE ADENOCARCINOMA WITH EXTRACELLULAR MUCIN, INVADING THROUGH THE MUSCULARIS PROPRIA INTO PERICOLONIC FATTY TISSUE. - THIRTEEN LYMPH NODES, NEGATIVE FOR METASTATIC CARCINOMA (0/13). - MULTIPLE DIVERTICULA. - RESECTION MARGINS, NEGATIVE FOR ATYPIA OR MALIGNANCY. - PLEASE SEE ONCOLOGY TEMPLATE FOR DETAILS. 2. Colon, resection margin (donut), sigmoid - BENIGN COLONIC MUCOSA, NO EVIDENCE OF DYSPLASIA OR MALIGNANCY.  Diagnosis 12/08/2014  Colon, segmental resection for tumor, right colon MODERATELY DIFFERENTIATED ADENOCARCINOMA OF THE COLON (3.8 CM) THE TUMOR PENETRATES TO THE SURFACE OF THE VISCERAL PERITONEUM (T4A) LYMPHOVASCULAR INVASION IS IDENTIFIED MARGINS OF RESECTION ARE NEGATIVE FOR TUMOR NINTEEN BENIGN LYMPH NODE (0/19) ONE TUMOR DEPOSITE IS IDENTIFIED ([place])   RADIOGRAPHIC STUDIES: I have personally reviewed the radiological images as listed and agreed with the findings in the report.  CT CAP 04/17/16 IMPRESSION: 1. Slight interval improvement in  extensive peritoneal carcinomatosis compared with prior CT from 02/02/2016. No definite disease progression. 2. No evidence of thoracic or hepatic metastases. 3. No definite bowel wall invasion, ascites or bowel obstruction. 4. Cholelithiasis. 5. Stable mild pulmonary nodularity, likely benign.  CT CAP 02/02/2016 IMPRESSION: 1. Extensive worsening of peritoneal spread of tumor with numerous large new tumor nodules scattered throughout The upper omentum and also scattered within along the mesentery. Mild extension into the right retroperitoneum at the level of the iliac crest. 2. Slight reduction in size of the right upper lobe pulmonary nodule 1.1 cm, previously 1.2 cm. Stable indistinct ground-glass density nodule in the right upper lobe. 3. Airway thickening is present, suggesting bronchitis or reactive airways disease. 4. Ascending aortic aneurysm 4.0 cm in diameter. Recommend annual imaging followup by CTA or MRA. This recommendation follows 2010 ACCF/AHA/AATS/ACR/ASA/SCA/SCAI/SIR/STS/SVM Guidelines for the Diagnosis and Management of Patients with Thoracic Aortic Disease. Circulation. 2010; 121: e266-e369 5. Cholelithiasis.   PET 10/10/2015 IMPRESSION: 1. Multiple enlarging, hypermetabolic peritoneal nodules consistent with peritoneal carcinomatosis. No generalized ascites. 2. No evidence of metastatic disease to the liver. 3. No suspicious pulmonary findings. Grossly stable chronic postinflammatory changes. 4. Multinodular goiter.  Diagnosis 11/04/2015 Peritoneum, biopsy, RLQ - METASTATIC COLORECTAL ADENOCARCINOMA. - SEE COMMENT.  ASSESSMENT & PLAN:  83 y.o. Caucasian female, with past medical history of colon diverticulosis and arthritis, but otherwise healthy and active, who was found to have a large sigmoid tumor, status post sigmoidectomy.  1. Sigmoid colon adenocarcinoma, pT3N0M0, stage II, G1, MSI stable, ascending colon adenocarcinoma, pT4aN1cM0, stage IIIB,  MSI stable, G2, recurrent peritoneal carcinomatosis in 08/2015 -She had multifocal (2) colon cancer status post complete surgical resection,  -I previously reviewed her recent surgical pathology results in great details with her. The right colon cancer has several high-risk features, including stage IIIB, T4a lesion, tumor deposits, lymphovascular invasion, her risk of colon cancer recurrence is high.  -She tried adjuvant chemotherapy Xeloda, but could not tolerate full dose, and last cycle chemotherapy was canceled due to her tolerance issue. -I previously reviewed her recent PET scan from 10/10/2015, which unfortunately showed multiple enlarging, hypermetabolic peritoneal nodules consistent with peritoneal carcinomatosis. No ascites or other metastasis. -I previously discussed her peritoneal mass biopsy, which confirmed metastatic colon cancer  -We previously discussed that her cancer is not curable at this stage, she is not a candidate for debulking and HIPEC, and her goal of care is palliation.  -Her [facility] genomic testing results were discussed with the patient. She has (+) KRAS mutation, not a candidate for EGFR inhibitor   -Her tumor has MSI stable, not a good candidate for immunotherapy alone  -She has started first-line palliative chemotherapy with 5-FU infusion and Avastin, tolerate treatment better w/o 5-fu bolus -We previously reviewed her staging CT scan from 04/17/2016, which showed partial response of her peritoneal metastasis, no other new lesions. -She is doing well overall, lab previously reviewed, adequate for treatment, we'll proceeded with 6th cycle 5-FU and Avastin -  Labs previously reviewed, CBC and CMP normal. Will continue chemo  -Labs reviewed and adequate for treatment cycle 10 today.  -Her CEA has been coming down lately  -Will have her staging CT CAP scan in 3-4 weeks  2. Rectal bleeding and diarrhea  -she previously developed diarrhea and rectal bleeding after  first cycle chemo, probably related to chemotherapy -Bleeding likely secondary to hemorrhoids, possibly related to Avastin. -I have encouraged her to use Imodium, I also previously gave a prescription of Lomotil for diarrhea -No significant bleeding after cycle 2 and 3 of chemo. The patient experienced some diarrhea from IV contrast following recent scan. -The patient is not taking Lomotil at this time, but I previously encouraged her to begin using it as needed for diarrhea   3. Peripheral neuropathy, G 1 -From previous chemotherapy Xeloda, mild and stable now.  4. GERD -she'll continue Protonix.  5.Lung nodules  -She has two small right lung nodules which were discovered on the CT scan in 09/2012,  RUL nodule slightly bigger on recent CT -She has been follow-up with Dr. Cyndia Bent.  -she never smoked, low risk for lung cancer -given the stability of the RUL nodule over 2 years, this is likely a benign lesion   6. Support/social issue -Her husband's dementia has been progressing and he has become aggressive and dangerous. -I have previously encouraged her to speak to our social worker.  -We previously discussed speaking to his doctor to get medication to help control his aggressive behavior.  -Her daughter would like to put him in a dementia facility.  7. Abdominal Pain -She has previously developed some abdominal pain lately, likely related to diarrhea and gas.  -She will take tylenol for the pain -Previously prescribed Tramadol. Take PRN up to  3 times a day.   8. HTN -Slightly elevated previously. No history of HTN, possible related to Avastin  -She will start monitoring and writing down her BP readings at home.  -I have started her on amlodipine for her hypertension, she ran out a few days ago, I refilled for today. Her blood pressure is elevated today.  9. Hair Loss - Patient's hair is starting to thin. I previously advised her that it is from her chemo and gave her the phone  number to [employer] so she can get a wig.   10.Goal of care discussion  -We previously discussed the incurable nature of her cancer, and the overall poor prognosis, especially if she does not have good response to chemotherapy or progress on chemo -The patient understands the goal of care is palliative. -I recommend DNR/DNI, she will think about it   Plan -Lab reviewed, adequate for treatment, we'll proceed 5-FU and Avastin today, and continue every 2 weeks  -Schedule Lab, flush, and chemo 5-fu and avastin in 4 and 6 weeks  -f/u in 4 weeks  -CT CAP with contrast in 3-4 weeks    All questions were answered. The patient knows to call the clinic with any problems, questions or concerns.  I spent 20 minutes counseling the patient face to face. The total time spent in the appointment was 25  minutes and more than 50% was on counseling.  This document serves as a record of services personally performed by Truitt Merle, MD. It was created on her behalf by Joslyn Devon, a trained medical scribe. The creation of this record is based on the scribe's personal observations and the provider's statements to them. This document has been checked and approved by the attending provider.    I have reviewed the above documentation for accuracy and completeness, and I agree with the above information.      Truitt Merle  06/21/2016

## 2016-06-20 ENCOUNTER — Encounter: Payer: Self-pay | Admitting: Surgery

## 2016-06-21 ENCOUNTER — Ambulatory Visit (HOSPITAL_BASED_OUTPATIENT_CLINIC_OR_DEPARTMENT_OTHER): Payer: PPO

## 2016-06-21 ENCOUNTER — Other Ambulatory Visit (HOSPITAL_BASED_OUTPATIENT_CLINIC_OR_DEPARTMENT_OTHER): Payer: PPO

## 2016-06-21 ENCOUNTER — Ambulatory Visit (HOSPITAL_BASED_OUTPATIENT_CLINIC_OR_DEPARTMENT_OTHER): Payer: PPO | Admitting: Hematology

## 2016-06-21 ENCOUNTER — Encounter: Payer: Self-pay | Admitting: Hematology

## 2016-06-21 VITALS — BP 156/87 | HR 56

## 2016-06-21 VITALS — BP 172/79 | HR 65 | Temp 98.4°F | Resp 18 | Ht 63.0 in | Wt 137.2 lb

## 2016-06-21 DIAGNOSIS — C182 Malignant neoplasm of ascending colon: Secondary | ICD-10-CM

## 2016-06-21 DIAGNOSIS — C187 Malignant neoplasm of sigmoid colon: Secondary | ICD-10-CM

## 2016-06-21 DIAGNOSIS — Z5111 Encounter for antineoplastic chemotherapy: Secondary | ICD-10-CM | POA: Diagnosis not present

## 2016-06-21 DIAGNOSIS — C786 Secondary malignant neoplasm of retroperitoneum and peritoneum: Secondary | ICD-10-CM

## 2016-06-21 DIAGNOSIS — C186 Malignant neoplasm of descending colon: Secondary | ICD-10-CM

## 2016-06-21 DIAGNOSIS — Z5112 Encounter for antineoplastic immunotherapy: Secondary | ICD-10-CM

## 2016-06-21 DIAGNOSIS — G620 Drug-induced polyneuropathy: Secondary | ICD-10-CM

## 2016-06-21 DIAGNOSIS — L658 Other specified nonscarring hair loss: Secondary | ICD-10-CM

## 2016-06-21 DIAGNOSIS — R918 Other nonspecific abnormal finding of lung field: Secondary | ICD-10-CM | POA: Diagnosis not present

## 2016-06-21 DIAGNOSIS — K219 Gastro-esophageal reflux disease without esophagitis: Secondary | ICD-10-CM

## 2016-06-21 DIAGNOSIS — R109 Unspecified abdominal pain: Secondary | ICD-10-CM | POA: Diagnosis not present

## 2016-06-21 DIAGNOSIS — Z95828 Presence of other vascular implants and grafts: Secondary | ICD-10-CM

## 2016-06-21 LAB — COMPREHENSIVE METABOLIC PANEL
ALBUMIN: 3.7 g/dL (ref 3.5–5.0)
ALK PHOS: 91 U/L (ref 40–150)
ALT: 12 U/L (ref 0–55)
AST: 21 U/L (ref 5–34)
Anion Gap: 9 mEq/L (ref 3–11)
BUN: 17.4 mg/dL (ref 7.0–26.0)
CO2: 27 mEq/L (ref 22–29)
Calcium: 9.3 mg/dL (ref 8.4–10.4)
Chloride: 108 mEq/L (ref 98–109)
Creatinine: 0.9 mg/dL (ref 0.6–1.1)
EGFR: 63 mL/min/{1.73_m2} — AB (ref >90)
GLUCOSE: 98 mg/dL (ref 70–140)
POTASSIUM: 4 meq/L (ref 3.5–5.1)
SODIUM: 144 meq/L (ref 136–145)
Total Bilirubin: 0.67 mg/dL (ref 0.20–1.20)
Total Protein: 6.5 g/dL (ref 6.4–8.3)

## 2016-06-21 LAB — CBC WITH DIFFERENTIAL/PLATELET
BASO%: 0.9 % (ref 0.0–2.0)
Basophils Absolute: 0 10*3/uL (ref 0.0–0.1)
EOS%: 4.5 % (ref 0.0–7.0)
Eosinophils Absolute: 0.2 10*3/uL (ref 0.0–0.5)
HCT: 36.8 % (ref 34.8–46.6)
HGB: 12.1 g/dL (ref 11.6–15.9)
LYMPH%: 14 % (ref 14.0–49.7)
MCH: 32.1 pg (ref 25.1–34.0)
MCHC: 32.9 g/dL (ref 31.5–36.0)
MCV: 97.6 fL (ref 79.5–101.0)
MONO#: 0.5 10*3/uL (ref 0.1–0.9)
MONO%: 10.2 % (ref 0.0–14.0)
NEUT%: 70.4 % (ref 38.4–76.8)
NEUTROS ABS: 3.3 10*3/uL (ref 1.5–6.5)
Platelets: 127 10*3/uL — ABNORMAL LOW (ref 145–400)
RBC: 3.77 10*6/uL (ref 3.70–5.45)
RDW: 14.4 % (ref 11.2–14.5)
WBC: 4.7 10*3/uL (ref 3.9–10.3)
lymph#: 0.7 10*3/uL — ABNORMAL LOW (ref 0.9–3.3)

## 2016-06-21 MED ORDER — PROCHLORPERAZINE MALEATE 10 MG PO TABS
ORAL_TABLET | ORAL | Status: AC
  Filled 2016-06-21: qty 1

## 2016-06-21 MED ORDER — SODIUM CHLORIDE 0.9 % IV SOLN
Freq: Once | INTRAVENOUS | Status: AC
  Administered 2016-06-21: 11:00:00 via INTRAVENOUS

## 2016-06-21 MED ORDER — AMLODIPINE BESYLATE 5 MG PO TABS: 5.0000 mg | ORAL_TABLET | Freq: Every day | ORAL | 2 refills | Status: DC

## 2016-06-21 MED ORDER — PROCHLORPERAZINE MALEATE 10 MG PO TABS
10.0000 mg | ORAL_TABLET | Freq: Once | ORAL | Status: AC
Start: 2016-06-21 — End: 2016-06-21
  Administered 2016-06-21: 10 mg via ORAL

## 2016-06-21 MED ORDER — SODIUM CHLORIDE 0.9 % IV SOLN
4.8000 mg/kg | Freq: Once | INTRAVENOUS | Status: AC
  Administered 2016-06-21: 300 mg via INTRAVENOUS
  Filled 2016-06-21: qty 12

## 2016-06-21 MED ORDER — SODIUM CHLORIDE 0.9% FLUSH
10.0000 mL | INTRAVENOUS | Status: DC | PRN
Start: 2016-06-21 — End: 2016-06-21
  Administered 2016-06-21: 10 mL via INTRAVENOUS
  Filled 2016-06-21: qty 10

## 2016-06-21 MED ORDER — SODIUM CHLORIDE 0.9 % IV SOLN
2400.0000 mg/m2 | INTRAVENOUS | Status: DC
  Administered 2016-06-21: 4050 mg via INTRAVENOUS
  Filled 2016-06-21: qty 81

## 2016-06-21 MED ORDER — LEUCOVORIN CALCIUM INJECTION 350 MG
400.0000 mg/m2 | Freq: Once | INTRAVENOUS | Status: AC
  Administered 2016-06-21: 676 mg via INTRAVENOUS
  Filled 2016-06-21: qty 33.8

## 2016-06-21 NOTE — Patient Instructions (Signed)

## 2016-06-21 NOTE — Patient Instructions (Signed)
[facility]cer Center Discharge Instructions for Patients Receiving Chemotherapy  Today you received the following chemotherapy agents:  Avastin, Leucovorin, Fluorouracil  To help prevent nausea and vomiting after your treatment, we encourage you to take your nausea medication as prescribed.   If you develop nausea and vomiting that is not controlled by your nausea medication, call the clinic.   BELOW ARE SYMPTOMS THAT SHOULD BE REPORTED IMMEDIATELY:  *FEVER GREATER THAN 100.5 F  *CHILLS WITH OR WITHOUT FEVER  NAUSEA AND VOMITING THAT IS NOT CONTROLLED WITH YOUR NAUSEA MEDICATION  *UNUSUAL SHORTNESS OF BREATH  *UNUSUAL BRUISING OR BLEEDING  TENDERNESS IN MOUTH AND THROAT WITH OR WITHOUT PRESENCE OF ULCERS  *URINARY PROBLEMS  *BOWEL PROBLEMS  UNUSUAL RASH Items with * indicate a potential emergency and should be followed up as soon as possible.  Feel free to call the clinic you have any questions or concerns. The clinic phone number is [phone].  Please show the CHEMO ALERT CARD at check-in to the Emergency Department and triage nurse.   

## 2016-06-22 ENCOUNTER — Ambulatory Visit (INDEPENDENT_AMBULATORY_CARE_PROVIDER_SITE_OTHER): Payer: PPO | Admitting: Surgery

## 2016-06-22 ENCOUNTER — Encounter: Payer: Self-pay | Admitting: Surgery

## 2016-06-22 ENCOUNTER — Telehealth: Payer: Self-pay | Admitting: Hematology

## 2016-06-22 VITALS — BP 137/80 | HR 71 | Resp 16 | Ht 63.0 in | Wt 137.0 lb

## 2016-06-22 DIAGNOSIS — I712 Thoracic aortic aneurysm, without rupture, unspecified: Secondary | ICD-10-CM

## 2016-06-22 DIAGNOSIS — D381 Neoplasm of uncertain behavior of trachea, bronchus and lung: Secondary | ICD-10-CM | POA: Diagnosis not present

## 2016-06-22 NOTE — Telephone Encounter (Signed)
Patient bypassed scheduling on 06/21/16.  Appointments scheduled per 06/21/16 los. Patient was mailed an appointment letter and schedule, per 06/21/16 los.

## 2016-06-22 NOTE — Progress Notes (Signed)
HPI:  Mrs. Gault returns for follow up of a small ascending aortic aneurysm and a RUL lung nodule. She underwent sigmoid colon resection for colon cancer on 05/17/2014 and then was diagnosed with a second primary colon cancer in the ascending colon and underwent a second colon resection in 11/2014.  She is currently undergoing chemotherapy for progression of disease with suspected peritoneal carcinomatosis seen on CT of the abdomen that are hypermetabolic on PET scan 10/3788. She has had a small RUL spiculated nodule that was diagnosed in 09/2012 at which time it was 14 x 9 x 12 mm. A PET scan at that time showed no hypermetabolic activity and we have been following it since. It has not changed significantly in size on successive CT scans and was not hypermetabolic on her PET in 03/4095.  She has felt fine with no cough or sputum production, no hemoptysis. She denies fever.  Current Outpatient Prescriptions  Medication Sig Dispense Refill  . alendronate (FOSAMAX) 70 MG tablet Take 70 mg by mouth every 7 (seven) days. Sundays. Take with a full glass of water on an empty stomach.    Marland Kitchen amLODipine (NORVASC) 5 MG tablet Take 1 tablet (5 mg total) by mouth daily. 30 tablet 2  . Ascorbic Acid (VITAMIN C) 1000 MG tablet Take 1,000 mg by mouth daily.    . Biotin 1000 MCG tablet Take 1,000 mcg by mouth daily.     Marland Kitchen CALCIUM & MAGNESIUM CARBONATES PO Take 1 tablet by mouth daily.    . cholecalciferol (VITAMIN D) 1000 UNITS tablet Take 1,000 Units by mouth daily.    . diphenoxylate-atropine (LOMOTIL) 2.5-0.025 MG tablet Take 1-2 tablets by mouth 4 (four) times daily as needed for diarrhea or loose stools. 30 tablet 2  . glucosamine-chondroitin 500-400 MG tablet Take 1 tablet by mouth daily.     . Homeopathic Products ([employer] LEG CRAMPS PAIN RELIEF PO) Take 1 tablet by mouth daily as needed (for leg cramps).    Marland Kitchen ibuprofen (ADVIL,MOTRIN) 200 MG tablet Take 200 mg by mouth daily as needed for moderate pain.     Marland Kitchen  lidocaine-prilocaine (EMLA) cream Apply to affected area once 30 g 3  . loratadine (CLARITIN) 10 MG tablet Take 10 mg by mouth daily.    . Multiple Vitamin (MULTIVITAMIN) tablet Take 1 tablet by mouth daily.    . potassium chloride SA (K-DUR,KLOR-CON) 20 MEQ tablet Take 1 tablet (20 mEq total) by mouth daily. (Patient taking differently: Take 20 mEq by mouth every other day. ) 20 tablet 1  . ranitidine (ZANTAC) 150 MG tablet Take 150 mg by mouth as needed for heartburn.    . traMADol (ULTRAM) 50 MG tablet Take 1 tablet (50 mg total) by mouth every 6 (six) hours as needed. 30 tablet 1  . vitamin B-12 (CYANOCOBALAMIN) 1000 MCG tablet Take 1,000 mcg by mouth daily.     . ondansetron (ZOFRAN) 8 MG tablet Take 1 tablet (8 mg total) by mouth every 8 (eight) hours as needed (Nausea or vomiting). (Patient not taking: Reported on 06/21/2016) 30 tablet 1  . prochlorperazine (COMPAZINE) 10 MG tablet Take 1 tablet (10 mg total) by mouth every 8 (eight) hours as needed for nausea (Nausea or vomiting). (Patient not taking: Reported on 06/22/2016) 30 tablet 0  . urea (CARMOL) 10 % cream Apply topically 2 (two) times daily. Apply to hand twice daily (Patient not taking: Reported on 06/21/2016) 142 g 0   No current facility-administered medications  for this visit.      Physical Exam: BP 137/80 (BP Location: Right Arm, Patient Position: Sitting, Cuff Size: Large)   Pulse 71   Resp 16   Ht 5\' 3"  (1.6 m)   Wt 137 lb (62.1 kg)   SpO2 93% Comment: ON RA  BMI 24.27 kg/m   She looks well but a little thinner than the last time I saw her. Lungs are clear  There is no cervical or supraclavicular adenopathy. The left thyroid lobe is slighyly prominent.   Diagnostic Tests:  CLINICAL DATA:  History of colon cancer diagnosed in 2016. Partial colectomy with chemotherapy in progress. No current complaints.  EXAM: CT CHEST, ABDOMEN, AND PELVIS WITH CONTRAST  TECHNIQUE: Multidetector CT imaging of the chest,  abdomen and pelvis was performed following the standard protocol during bolus administration of intravenous contrast.  CONTRAST:  114mL ISOVUE-300 IOPAMIDOL (ISOVUE-300) INJECTION 61%  COMPARISON:  CTs 02/02/2016.  PET-CT 10/10/2015.  FINDINGS: CT CHEST FINDINGS  Cardiovascular: Stable mild atherosclerosis of the aorta and coronary arteries. There is stable mild dilatation of the ascending aorta to 4 cm. No evidence of dissection or other acute vascular findings. Right IJ Port-A-Cath extends to the mid SVC. The heart size is normal. There is no pericardial effusion.  Mediastinum/Nodes: There are no enlarged mediastinal, hilar or axillary lymph nodes. Stable asymmetric multinodular quarter on the left. There is a small hiatal hernia.  Lungs/Pleura: There is no pleural effusion. Irregular 11 mm nodular density in the right upper lobe just superior to the fissure on image 67 is unchanged. This was not hypermetabolic on prior PET-CT. There is additional scattered mild pulmonary scarring. No suspicious or enlarging pulmonary nodules.  Musculoskeletal/Chest wall: No chest wall mass or suspicious osseous findings.  CT ABDOMEN AND PELVIS FINDINGS  Hepatobiliary: The liver has a stable appearance without focal abnormality. Small gallstones are present within the gallbladder. The gallbladder is mildly distended without wall thickening or surrounding inflammation. No biliary dilatation.  Pancreas: Atrophied without focal lesion, surrounding inflammation or ductal dilatation.  Spleen: Normal in size. Scattered small low-density splenic lesions are stable.  Adrenals/Urinary Tract: Both adrenal glands appear normal. The kidneys appear normal without evidence of urinary tract calculus, suspicious lesion or hydronephrosis. No bladder abnormalities are seen.  Stomach/Bowel: No evidence of bowel wall thickening, distention or surrounding inflammatory change. No definite  bowel wall extension of tumor on the current study as previously questioned. Ileocolonic and rectal anastomoses noted.  Vascular/Lymphatic: Stable small left external iliac node on image 100. No enlarging retroperitoneal or mesenteric lymph nodes are seen. There is atherosclerosis of the aorta and great vessels. The inferior mesenteric artery appears chronically occluded. No acute vascular findings are seen.  Reproductive: Surgically absent.  No adnexal mass.  Other: Extensive peritoneal carcinomatosis again noted with multiple peritoneal/ omental implants. Overall, there has been some improvement, especially in the right mid abdomen. The dominant midline ventral implant measures 3.1 x 2.1 cm on image 69 (previously 3.7 x 2.1 cm). No definite enlarging implants. Right retroperitoneal component on images 80-83 is unchanged. No ascites.  Musculoskeletal: No acute or significant osseous findings. Lumbar spondylosis and convex left scoliosis are noted.  IMPRESSION: 1. Slight interval improvement in extensive peritoneal carcinomatosis compared with prior CT from 02/02/2016. No definite disease progression. 2. No evidence of thoracic or hepatic metastases. 3. No definite bowel wall invasion, ascites or bowel obstruction. 4. Cholelithiasis. 5. Stable mild pulmonary nodularity, likely benign.   Electronically Signed   By: Gwyndolyn Saxon  Lin Landsman M.D.   On: 04/17/2016 16:32   Impression:  The RUL lung nodule is unchanged on serial CT scans and not hypermetabolic on her last two PET scans and most likely benign. I reviewed the CT scans with her and answered her questions. Her most significant problem at this time is metastatic colorectal cancer with peritoneal metastases. She is going to continue chemotherapy for now.   Plan:  She will continue to follow up with Dr. Burr Medico. I will be happy to see her back if the need arises but I think this RUL lung lesion can be followed up if she has  further CT scans scheduled by Dr. Burr Medico.   Gaye Pollack, MD [facility] [phone]

## 2016-06-23 ENCOUNTER — Ambulatory Visit (HOSPITAL_BASED_OUTPATIENT_CLINIC_OR_DEPARTMENT_OTHER): Payer: Self-pay

## 2016-06-23 VITALS — BP 118/75 | HR 75 | Temp 98.7°F

## 2016-06-23 DIAGNOSIS — C182 Malignant neoplasm of ascending colon: Secondary | ICD-10-CM

## 2016-06-23 DIAGNOSIS — C186 Malignant neoplasm of descending colon: Secondary | ICD-10-CM

## 2016-06-23 MED ORDER — SODIUM CHLORIDE 0.9% FLUSH
10.0000 mL | INTRAVENOUS | Status: DC | PRN
  Administered 2016-06-23: 10 mL
  Filled 2016-06-23: qty 10

## 2016-06-23 MED ORDER — HEPARIN SOD (PORK) LOCK FLUSH 100 UNIT/ML IV SOLN
500.0000 [IU] | Freq: Once | INTRAVENOUS | Status: AC | PRN
  Administered 2016-06-23: 500 [IU]
  Filled 2016-06-23: qty 5

## 2016-07-05 ENCOUNTER — Other Ambulatory Visit (HOSPITAL_BASED_OUTPATIENT_CLINIC_OR_DEPARTMENT_OTHER): Payer: PPO

## 2016-07-05 ENCOUNTER — Ambulatory Visit (HOSPITAL_BASED_OUTPATIENT_CLINIC_OR_DEPARTMENT_OTHER): Payer: PPO

## 2016-07-05 VITALS — BP 148/78 | HR 76 | Temp 97.7°F | Resp 18

## 2016-07-05 DIAGNOSIS — C182 Malignant neoplasm of ascending colon: Secondary | ICD-10-CM

## 2016-07-05 DIAGNOSIS — C187 Malignant neoplasm of sigmoid colon: Secondary | ICD-10-CM

## 2016-07-05 DIAGNOSIS — C186 Malignant neoplasm of descending colon: Secondary | ICD-10-CM

## 2016-07-05 DIAGNOSIS — Z95828 Presence of other vascular implants and grafts: Secondary | ICD-10-CM

## 2016-07-05 DIAGNOSIS — Z5111 Encounter for antineoplastic chemotherapy: Secondary | ICD-10-CM | POA: Diagnosis not present

## 2016-07-05 DIAGNOSIS — Z5112 Encounter for antineoplastic immunotherapy: Secondary | ICD-10-CM | POA: Diagnosis not present

## 2016-07-05 LAB — CBC WITH DIFFERENTIAL/PLATELET
BASO%: 0.8 % (ref 0.0–2.0)
BASOS ABS: 0 10*3/uL (ref 0.0–0.1)
EOS ABS: 0.2 10*3/uL (ref 0.0–0.5)
EOS%: 3.2 % (ref 0.0–7.0)
HEMATOCRIT: 37 % (ref 34.8–46.6)
HGB: 12.5 g/dL (ref 11.6–15.9)
LYMPH#: 0.6 10*3/uL — AB (ref 0.9–3.3)
LYMPH%: 11.1 % — AB (ref 14.0–49.7)
MCH: 32.7 pg (ref 25.1–34.0)
MCHC: 33.7 g/dL (ref 31.5–36.0)
MCV: 96.9 fL (ref 79.5–101.0)
MONO#: 0.4 10*3/uL (ref 0.1–0.9)
MONO%: 7.6 % (ref 0.0–14.0)
NEUT#: 4.5 10*3/uL (ref 1.5–6.5)
NEUT%: 77.3 % — AB (ref 38.4–76.8)
PLATELETS: 128 10*3/uL — AB (ref 145–400)
RBC: 3.82 10*6/uL (ref 3.70–5.45)
RDW: 14.3 % (ref 11.2–14.5)
WBC: 5.8 10*3/uL (ref 3.9–10.3)

## 2016-07-05 LAB — COMPREHENSIVE METABOLIC PANEL
ALT: 12 U/L (ref 0–55)
ANION GAP: 8 meq/L (ref 3–11)
AST: 20 U/L (ref 5–34)
Albumin: 3.8 g/dL (ref 3.5–5.0)
Alkaline Phosphatase: 88 U/L (ref 40–150)
BUN: 21.2 mg/dL (ref 7.0–26.0)
CHLORIDE: 109 meq/L (ref 98–109)
CO2: 27 meq/L (ref 22–29)
CREATININE: 0.9 mg/dL (ref 0.6–1.1)
Calcium: 9.1 mg/dL (ref 8.4–10.4)
EGFR: 58 mL/min/{1.73_m2} — AB (ref >90)
Glucose: 99 mg/dl (ref 70–140)
Potassium: 4.1 mEq/L (ref 3.5–5.1)
Sodium: 143 mEq/L (ref 136–145)
Total Bilirubin: 0.95 mg/dL (ref 0.20–1.20)
Total Protein: 6.5 g/dL (ref 6.4–8.3)

## 2016-07-05 LAB — UA PROTEIN, DIPSTICK - CHCC: PROTEIN: NEGATIVE mg/dL

## 2016-07-05 LAB — CEA (IN HOUSE-CHCC): CEA (CHCC-In House): 313.82 ng/mL — ABNORMAL HIGH (ref 0.00–5.00)

## 2016-07-05 MED ORDER — SODIUM CHLORIDE 0.9% FLUSH
10.0000 mL | INTRAVENOUS | Status: DC | PRN
  Administered 2016-07-05: 10 mL via INTRAVENOUS
  Filled 2016-07-05: qty 10

## 2016-07-05 MED ORDER — SODIUM CHLORIDE 0.9 % IV SOLN
Freq: Once | INTRAVENOUS | Status: AC
  Administered 2016-07-05: 11:00:00 via INTRAVENOUS

## 2016-07-05 MED ORDER — SODIUM CHLORIDE 0.9% FLUSH
10.0000 mL | INTRAVENOUS | Status: DC | PRN
Start: 2016-07-05 — End: 2016-07-05
  Filled 2016-07-05: qty 10

## 2016-07-05 MED ORDER — SODIUM CHLORIDE 0.9 % IV SOLN
4.8000 mg/kg | Freq: Once | INTRAVENOUS | Status: AC
  Administered 2016-07-05: 300 mg via INTRAVENOUS
  Filled 2016-07-05: qty 12

## 2016-07-05 MED ORDER — PROCHLORPERAZINE MALEATE 10 MG PO TABS
10.0000 mg | ORAL_TABLET | Freq: Once | ORAL | Status: AC
  Administered 2016-07-05: 10 mg via ORAL

## 2016-07-05 MED ORDER — LEUCOVORIN CALCIUM INJECTION 350 MG
400.0000 mg/m2 | Freq: Once | INTRAMUSCULAR | Status: AC
  Administered 2016-07-05: 676 mg via INTRAVENOUS
  Filled 2016-07-05: qty 33.8

## 2016-07-05 MED ORDER — HEPARIN SOD (PORK) LOCK FLUSH 100 UNIT/ML IV SOLN
500.0000 [IU] | Freq: Once | INTRAVENOUS | Status: DC | PRN
  Filled 2016-07-05: qty 5

## 2016-07-05 MED ORDER — PROCHLORPERAZINE MALEATE 10 MG PO TABS
ORAL_TABLET | ORAL | Status: AC
  Filled 2016-07-05: qty 1

## 2016-07-05 MED ORDER — SODIUM CHLORIDE 0.9 % IV SOLN
2400.0000 mg/m2 | INTRAVENOUS | Status: DC
  Administered 2016-07-05: 4050 mg via INTRAVENOUS
  Filled 2016-07-05: qty 81

## 2016-07-05 NOTE — Patient Instructions (Signed)
[facility] Discharge Instructions for Patients Receiving Chemotherapy  Today you received the following chemotherapy agents Avastin, Leucovorin and Adrucil  To help prevent nausea and vomiting after your treatment, we encourage you to take your nausea medication as directed.    If you develop nausea and vomiting that is not controlled by your nausea medication, call the clinic.   BELOW ARE SYMPTOMS THAT SHOULD BE REPORTED IMMEDIATELY:  *FEVER GREATER THAN 100.5 F  *CHILLS WITH OR WITHOUT FEVER  NAUSEA AND VOMITING THAT IS NOT CONTROLLED WITH YOUR NAUSEA MEDICATION  *UNUSUAL SHORTNESS OF BREATH  *UNUSUAL BRUISING OR BLEEDING  TENDERNESS IN MOUTH AND THROAT WITH OR WITHOUT PRESENCE OF ULCERS  *URINARY PROBLEMS  *BOWEL PROBLEMS  UNUSUAL RASH Items with * indicate a potential emergency and should be followed up as soon as possible.  Feel free to call the clinic you have any questions or concerns. The clinic phone number is [phone].  Please show the CHEMO ALERT CARD at check-in to the Emergency Department and triage nurse.   

## 2016-07-05 NOTE — Progress Notes (Signed)
Per Dr. Burr Medico okay to treat today without urine protein. Patient is to try to obtain one before leaving today. Patient aware and verbalized understanding.   Patient states she had a tick bite two days ago. Per this RN the site is red and inflamed and the patient states it is "itchy". Dr. Burr Medico notified and per Dr. Burr Medico okay to treat today. Patient is to see primary care doctor. Patient aware and verbalized understanding.

## 2016-07-05 NOTE — Patient Instructions (Signed)

## 2016-07-07 ENCOUNTER — Ambulatory Visit (HOSPITAL_BASED_OUTPATIENT_CLINIC_OR_DEPARTMENT_OTHER): Payer: Self-pay

## 2016-07-07 VITALS — BP 155/76 | HR 72 | Temp 97.5°F | Resp 18

## 2016-07-07 DIAGNOSIS — C182 Malignant neoplasm of ascending colon: Secondary | ICD-10-CM

## 2016-07-07 DIAGNOSIS — C186 Malignant neoplasm of descending colon: Secondary | ICD-10-CM

## 2016-07-07 MED ORDER — SODIUM CHLORIDE 0.9% FLUSH
10.0000 mL | INTRAVENOUS | Status: DC | PRN
  Administered 2016-07-07: 10 mL
  Filled 2016-07-07: qty 10

## 2016-07-07 MED ORDER — HEPARIN SOD (PORK) LOCK FLUSH 100 UNIT/ML IV SOLN
500.0000 [IU] | Freq: Once | INTRAVENOUS | Status: AC | PRN
  Administered 2016-07-07: 500 [IU]
  Filled 2016-07-07: qty 5

## 2016-07-13 NOTE — Progress Notes (Signed)
- [place]  [phone] [phone]  Clinic Follow Up Note   Patient Care Team: Prince Solian, MD as PCP - General (Internal Medicine) Gaye Pollack, MD as Consulting Physician (Cardiothoracic Surgery) Gatha Mayer, MD as Consulting Physician (Gastroenterology) Leighton Ruff, MD as Consulting Physician (General Surgery) Tania Ade, RN as Registered Nurse (Medical Oncology) 07/19/2016   CHIEF COMPLAINTS:  Follow-up of colon cancer  Oncology History   Cancer of left colon [facility])   Staging form: Colon and Rectum, AJCC 7th Edition     Pathologic stage from 05/17/2014: Stage IIA (T3, N0, cM0) - Signed by Truitt Merle, MD on 02/21/2015 Cancer of right colon [facility])   Staging form: Colon and Rectum, AJCC 7th Edition     Pathologic stage from 12/08/2014: Stage IIIB (T4a, N1a, cM0) - Signed by Truitt Merle, MD on 02/21/2015       Cancer of right colon ([place])   04/09/2014 Procedure    Colonoscopy per Dr. Silvano Rusk: Large fleshy mass sigmoid colon 25 cm from anus. Could not pass through.      04/09/2014 Tumor Marker    CEA =11.7      04/12/2014 Imaging    CT C/A/P:sigmoid colon mass;several small areas of soft tissue nodularity in peritoneal cavity; small attenuation structure posterior right hepatic lobe; 2 stable nodules in lungs dating back to 2014. No acute findings.      05/17/2014 Initial Diagnosis    Colon cancer-presented with heme + stool and more frequent BMs      05/17/2014 Surgery    Robot assisted sigmoidectomy      05/17/2014 Pathologic Stage    pT3,pN0,pMX--Grade 1--0/13 nodes +--MMR normal      05/17/2014 Clinical Stage    Stage II      12/08/2014 Surgery    right hemicolectomy       12/08/2014 Pathology Results    right colon adenocarcinoma, pT4aN1c, 19 nodes were negative, LVI(+), MMR normal       01/17/2015 - 06/27/2015 Chemotherapy    adjuvant capecitabine 1542m q12h, 2 weeks on and 1 week off. dose reduction and  stopped after 6 cycles due to poor tolerance,       08/15/2015 Progression    CT abdomen showed peritoneal nodules, highly suspicious for recurrent metastasis      10/10/2015 Imaging    PET scan showed multiple enlarging, hypermetabolic peritoneal nodules consistent with peritoneal carcinomatosis. No other evidence of metastasis      11/04/2015 Pathology Results    Diagnosis 11/04/2015 Peritoneum, biopsy, RLQ - METASTATIC COLORECTAL ADENOCARCINOMA. - SEE COMMENT. -[facility]      02/02/2016 Imaging    CT CAP W CONTRAST 02/02/2016 IMPRESSION: 1. Extensive worsening of peritoneal spread of tumor with numerous large new tumor nodules scattered throughout The upper omentum and also scattered within along the mesentery. Mild extension into the right retroperitoneum at the level of the iliac crest. 2. Slight reduction in size of the right upper lobe pulmonary nodule 1.1 cm, previously 1.2 cm. Stable indistinct ground-glass density nodule in the right upper lobe. 3. Airway thickening is present, suggesting bronchitis or reactive airways disease. 4. Ascending aortic aneurysm 4.0 cm in diameter. Recommend annual imaging followup by CTA or MRA. This recommendation follows 2010 ACCF/AHA/AATS/ACR/ASA/SCA/SCAI/SIR/STS/SVM Guidelines for the Diagnosis and Management of Patients with Thoracic Aortic Disease. Circulation. 2010; 121: e266-e369 5. Cholelithiasis.      02/08/2016 -  Chemotherapy    5-fu infusion and Avastin every 2 weeks  04/17/2016 Imaging    CT CAP  IMPRESSION: 1. Slight interval improvement in extensive peritoneal carcinomatosis compared with prior CT from 02/02/2016. No definite disease progression. 2. No evidence of thoracic or hepatic metastases. 3. No definite bowel wall invasion, ascites or bowel obstruction. 4. Cholelithiasis. 5. Stable mild pulmonary nodularity, likely benign.      07/17/2016 Imaging    CT CAP w contrast IMPRESSION: 1. Today's study  demonstrates progressive peritoneal carcinomatosis, as detailed above. Mildly enlarged hepatoduodenal ligament lymph node also noted. 2. No other metastatic disease noted elsewhere in the chest (previously noted right upper lobe pulmonary nodule remains stable over several prior examinations), abdomen or pelvis. 3. Aortic atherosclerosis, with ectasia of ascending thoracic aorta (4.3 cm in diameter). Recommend annual imaging followup by CTA or MRA. This recommendation follows 2010        HISTORY OF PRESENTING ILLNESS:  Heidi Marquez 83 y.o. female is here because of recently diagnosed a stage II sigmoid colon cancer.  She had annual physical with her PCP in early Feb this year, and stool OB (+), no overt GI bleeding, CBC was normal. She had noticed some loose BM before that, but no nausea, loss of appetite, weight loss or other symoptoms. Colonoscopy showed a large mass in the sigmoid colon 25 cm from anus. Biopsy was taken which showed tubulovillous adenoma, no malignancy. She was referred to surgeon Dr. Marcello Moores and underwent robotic-assisted sigmoidectomy on 4/4. She tolerated the procedure well, and was discharged home afterwards.  She has recovered well from the surgery 3 weeks ago, she has mild fatigue, but otherwise doing very well without other complains. She had 2-3 episodes of bleeding after the surgery, which was related to her herromods. She is eating well. 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   She has chronic knee pain she takes ibuprofen once daily.    CURRENT THERAPY: second line chemotherapy Irinotecan and Avastin every 2 weeks, started on 07/19/2016  INTERIM HISTORY:  Heidi Marquez returns for follow-up and Discuss her restaging CT scan.She has been doing well and denies issues with chemo, diarrhea ornausea and her appetite is great. Her energy is also good. She has 5-6 bowel movements daily.  She takes care of her  husband at home.   MEDICAL HISTORY:  Past Medical History:  Diagnosis Date  . Adenocarcinoma of colon [facility]) oncologist-  dr Truitt Merle---  last chemo 01-17-2015 to 06-27-2015)   dx 05-17-2014 sigmoid (left) cancer, Stage 2, G1 (pT3N0M0), MSI stable -- s/p  sigmoidectomy /  dx 12-08-2014 ascending colon cancer (right) , Stage 3B, G2, (pT4aN1cM0), MSI stable -- s/p  right hemicolectomy/  08-2015  recurrent peritoneal carcinomatosis  . Arthritis    knees  . Coronary atherosclerosis   . Diverticulosis of colon   . GERD (gastroesophageal reflux disease)   . HH (hiatus hernia)   . History of basal cell carcinoma excision    right side nose 2013  s/p  moh's  . History of epistaxis    2005  caurterized twice  . History of esophageal dilatation    2009 for stricture  . History of esophagitis    reflux  . Internal hemorrhoids   . Macular degeneration of both eyes   . Multinodular thyroid    remote hx benign bx 2005  . Peritoneal carcinomatosis ([place])    secondary to primary colon cancer  . Pulmonary nodule, right    noted since 2014-- stable per  last ct 06-15-2015  . Thoracic ascending aortic aneurysm [facility])    followed by dr Cyndia Bent ([facility])  last ct 06-15-2015  4.3cm    SURGICAL HISTORY: Past Surgical History:  Procedure Laterality Date  . COLONOSCOPY WITH PROPOFOL N/A 09/30/2014   Procedure: COLONOSCOPY WITH PROPOFOL;  Surgeon: Milus Banister, MD;  Location: [facility];  Service: Endoscopy;  Laterality: N/A;  . ESOPHAGOGASTRODUODENOSCOPY  2009  . INGUINAL HERNIA REPAIR Right early 1990s  . MOHS SURGERY  11/2011   nose  . PORTACATH PLACEMENT Right 01/24/2016   Procedure: INSERTION PORT-A-CATH;  Surgeon: Leighton Ruff, MD;  Location: [facility];  Service: General;  Laterality: Right;  . ROBOTIC ASSISTED RIGHT HEMICOLECTOMY  18/84/1660    dr Leighton Ruff  . ROBOTIC ASSISTED SIGMOIDECTOMY  63/02/6008   dr Leighton Ruff  . UPPER GASTROINTESTINAL ENDOSCOPY    . VAGINAL  HYSTERECTOMY  age 89   partial with the bladder tack    SOCIAL HISTORY: Social History   Social History  . Marital status: Married    Spouse name: N/A  . Number of children: N/A  . Years of education: N/A   Occupational History  . Not on file.   Social History Main Topics  . Smoking status: Never Smoker  . Smokeless tobacco: Never Used  . Alcohol use No  . Drug use: No  . Sexual activity: Not on file   Other Topics Concern  . Not on file   Social History Narrative   Married, husband is disabled (physical and dementia) and she is caregiver   Has #1 child (daughter) living. Son died of MI at age 3   Worked at [employer] before she retired   Has #3 cats    FAMILY HISTORY: Family History  Problem Relation Age of Onset  . Colon cancer Paternal Grandfather 50       colon cancer   . Stroke Father   . Heart attack Son 68       died of heart attack   . Esophageal cancer Neg Hx   . Rectal cancer Neg Hx   . Stomach cancer Neg Hx     ALLERGIES:  is allergic to sulfa antibiotics; amoxicillin-pot clavulanate; and penicillins.  MEDICATIONS:  Current Outpatient Prescriptions  Medication Sig Dispense Refill  . alendronate (FOSAMAX) 70 MG tablet Take 70 mg by mouth every 7 (seven) days. Sundays. Take with a full glass of water on an empty stomach.    Marland Kitchen amLODipine (NORVASC) 5 MG tablet Take 1 tablet (5 mg total) by mouth daily. 30 tablet 2  . Ascorbic Acid (VITAMIN C) 1000 MG tablet Take 1,000 mg by mouth daily.    . Biotin 1000 MCG tablet Take 1,000 mcg by mouth daily.     Marland Kitchen CALCIUM & MAGNESIUM CARBONATES PO Take 1 tablet by mouth daily.    . cholecalciferol (VITAMIN D) 1000 UNITS tablet Take 1,000 Units by mouth daily.    . diphenoxylate-atropine (LOMOTIL) 2.5-0.025 MG tablet Take 1-2 tablets by mouth 4 (four) times daily as needed for diarrhea or loose stools. 30 tablet 2  . glucosamine-chondroitin 500-400 MG tablet Take 1 tablet by mouth daily.     . Homeopathic  Products ([facility] LEG CRAMPS PAIN RELIEF PO) Take 1 tablet by mouth daily as needed (for leg cramps).    Marland Kitchen ibuprofen (ADVIL,MOTRIN) 200 MG tablet Take 200 mg by mouth daily as needed for moderate pain.     Marland Kitchen lidocaine-prilocaine (EMLA) cream Apply to affected  area once 30 g 3  . loratadine (CLARITIN) 10 MG tablet Take 10 mg by mouth daily.    . Multiple Vitamin (MULTIVITAMIN) tablet Take 1 tablet by mouth daily.    . ondansetron (ZOFRAN) 8 MG tablet Take 1 tablet (8 mg total) by mouth every 8 (eight) hours as needed (Nausea or vomiting). 30 tablet 1  . potassium chloride SA (K-DUR,KLOR-CON) 20 MEQ tablet Take 1 tablet (20 mEq total) by mouth daily. (Patient taking differently: Take 20 mEq by mouth every other day. ) 20 tablet 1  . ranitidine (ZANTAC) 150 MG tablet Take 150 mg by mouth as needed for heartburn.    . traMADol (ULTRAM) 50 MG tablet Take 1 tablet (50 mg total) by mouth every 6 (six) hours as needed. 30 tablet 1  . vitamin B-12 (CYANOCOBALAMIN) 1000 MCG tablet Take 1,000 mcg by mouth daily.     . prochlorperazine (COMPAZINE) 10 MG tablet Take 1 tablet (10 mg total) by mouth every 8 (eight) hours as needed for nausea (Nausea or vomiting). (Patient not taking: Reported on 06/22/2016) 30 tablet 0  . urea (CARMOL) 10 % cream Apply topically 2 (two) times daily. Apply to hand twice daily (Patient not taking: Reported on 06/21/2016) 142 g 0   No current facility-administered medications for this visit.    Facility-Administered Medications Ordered in Other Visits  Medication Dose Route Frequency Provider Last Rate Last Dose  . sodium chloride flush (NS) 0.9 % injection 10 mL  10 mL Intracatheter PRN Truitt Merle, MD   10 mL at 07/19/16 1515    REVIEW OF SYSTEMS:  Constitutional: Denies fevers, chills or abnormal night sweats (+) improved energy, good appetite Eyes: Denies blurriness of vision, double vision or watery eyes Ears, nose, mouth, throat, and face: Denies mucositis or sore  throat Respiratory: Denies cough, dyspnea or wheezes Cardiovascular: Denies palpitation, chest discomfort or lower extremity swelling Gastrointestinal:  Denies nausea, heartburn or change in bowel habits (+) frequent bowel movement Skin: Denies abnormal skin rashes Lymphatics: Denies new lymphadenopathy or easy bruising Neurological:Denies numbness, or new weaknesses  Behavioral/Psych: Mood is stable, no new changes  All other systems were reviewed with the patient and are negative.   PHYSICAL EXAMINATION:  ECOG PERFORMANCE STATUS: 1  Vitals:   07/19/16 1029  BP: (!) 155/70  Pulse: 60  Resp: 18  Temp: 97.4 F (36.3 C)   Filed Weights   07/19/16 1029  Weight: 137 lb (62.1 kg)     GENERAL:alert, no distress and comfortable SKIN: skin color, texture, turgor are normal, no rashes or significant lesions  EYES: normal, conjunctiva are pink and non-injected, sclera clear OROPHARYNX:no exudate, no erythema and lips, buccal mucosa, and tongue normal  NECK: supple, thyroid normal size, non-tender, without nodularity LYMPH:  no palpable lymphadenopathy in the cervical, axillary or inguinal LUNGS: clear to auscultation and percussion with normal breathing effort HEART: regular rate & rhythm and no murmurs and no lower extremity edema ABDOMEN:abdomen soft, surgical incision and to the low abdomen is well healed. non-tender and normal bowel sounds, rectal exam showed internal and external hemorrhoids, no bleeding, stool was green. Musculoskeletal:no cyanosis of digits and no clubbing  PSYCH: alert & oriented x 3 with fluent speech NEURO: no focal motor/sensory deficits   LABORATORY DATA:  I have reviewed the data as listed CBC Latest Ref Rng & Units 07/19/2016 07/05/2016 06/21/2016  WBC 3.9 - 10.3 10e3/uL 5.9 5.8 4.7  Hemoglobin 11.6 - 15.9 g/dL 11.8 12.5 12.1  Hematocrit 34.8 -  46.6 % 35.5 37.0 36.8  Platelets 145 - 400 10e3/uL 116(L) 128(L) 127(L)   CMP Latest Ref Rng & Units 07/19/2016  07/05/2016 06/21/2016  Glucose 70 - 140 mg/dl 106 99 98  BUN 7.0 - 26.0 mg/dL 21.1 21.2 17.4  Creatinine 0.6 - 1.1 mg/dL 0.9 0.9 0.9  Sodium 136 - 145 mEq/L 142 143 144  Potassium 3.5 - 5.1 mEq/L 4.1 4.1 4.0  Chloride 101 - 111 mmol/L - - -  CO2 22 - 29 mEq/L '27 27 27  ' Calcium 8.4 - 10.4 mg/dL 8.8 9.1 9.3  Total Protein 6.4 - 8.3 g/dL 6.3(L) 6.5 6.5  Total Bilirubin 0.20 - 1.20 mg/dL 0.91 0.95 0.67  Alkaline Phos 40 - 150 U/L 74 88 91  AST 5 - 34 U/L '20 20 21  ' ALT 0 - 55 U/L '9 12 12   ' CEA 05/02/2015: 2.6 08/15/2015: 9.6 12/16/2015: 253 03/08/2016: 463 04/05/16: 443.6 05/03/16: 347.82 05/31/16: 311.27 07/05/2016: 313.82  Pathology report:  Diagnosis 11/04/2015 Peritoneum, biopsy, RLQ - METASTATIC COLORECTAL ADENOCARCINOMA. - SEE COMMENT.  Diagnosis 05/17/2014 1. Colon, segmental resection for tumor, sigmoid, open end proximal - INVASIVE ADENOCARCINOMA WITH EXTRACELLULAR MUCIN, INVADING THROUGH THE MUSCULARIS PROPRIA INTO PERICOLONIC FATTY TISSUE. - THIRTEEN LYMPH NODES, NEGATIVE FOR METASTATIC CARCINOMA (0/13). - MULTIPLE DIVERTICULA. - RESECTION MARGINS, NEGATIVE FOR ATYPIA OR MALIGNANCY. - PLEASE SEE ONCOLOGY TEMPLATE FOR DETAILS. 2. Colon, resection margin (donut), sigmoid - BENIGN COLONIC MUCOSA, NO EVIDENCE OF DYSPLASIA OR MALIGNANCY.  Diagnosis 12/08/2014  Colon, segmental resection for tumor, right colon MODERATELY DIFFERENTIATED ADENOCARCINOMA OF THE COLON (3.8 CM) THE TUMOR PENETRATES TO THE SURFACE OF THE VISCERAL PERITONEUM (T4A) LYMPHOVASCULAR INVASION IS IDENTIFIED MARGINS OF RESECTION ARE NEGATIVE FOR TUMOR NINTEEN BENIGN LYMPH NODE (0/19) ONE TUMOR DEPOSITE IS IDENTIFIED ([place])   RADIOGRAPHIC STUDIES: I have personally reviewed the radiological images as listed and agreed with the findings in the report.  CT CAP w contrast 07/17/2016 IMPRESSION: 1. Today's study demonstrates progressive peritoneal carcinomatosis, as detailed above. Mildly enlarged  hepatoduodenal ligament lymph node also noted. 2. No other metastatic disease noted elsewhere in the chest (previously noted right upper lobe pulmonary nodule remains stable over several prior examinations), abdomen or pelvis. 3. Aortic atherosclerosis, with ectasia of ascending thoracic aorta (4.3 cm in diameter). Recommend annual imaging followup by CTA or MRA. This recommendation follows 2010  CT CAP 04/17/16 IMPRESSION: 1. Slight interval improvement in extensive peritoneal carcinomatosis compared with prior CT from 02/02/2016. No definite disease progression. 2. No evidence of thoracic or hepatic metastases. 3. No definite bowel wall invasion, ascites or bowel obstruction. 4. Cholelithiasis. 5. Stable mild pulmonary nodularity, likely benign.  CT CAP 02/02/2016 IMPRESSION: 1. Extensive worsening of peritoneal spread of tumor with numerous large new tumor nodules scattered throughout The upper omentum and also scattered within along the mesentery. Mild extension into the right retroperitoneum at the level of the iliac crest. 2. Slight reduction in size of the right upper lobe pulmonary nodule 1.1 cm, previously 1.2 cm. Stable indistinct ground-glass density nodule in the right upper lobe. 3. Airway thickening is present, suggesting bronchitis or reactive airways disease. 4. Ascending aortic aneurysm 4.0 cm in diameter. Recommend annual imaging followup by CTA or MRA. This recommendation follows 2010 ACCF/AHA/AATS/ACR/ASA/SCA/SCAI/SIR/STS/SVM Guidelines for the Diagnosis and Management of Patients with Thoracic Aortic Disease. Circulation. 2010; 121: e266-e369 5. Cholelithiasis.   PET 10/10/2015 IMPRESSION: 1. Multiple enlarging, hypermetabolic peritoneal nodules consistent with peritoneal carcinomatosis. No generalized ascites. 2. No evidence of metastatic disease to the liver.  3. No suspicious pulmonary findings. Grossly stable chronic postinflammatory changes. 4.  Multinodular goiter.  Diagnosis 11/04/2015 Peritoneum, biopsy, RLQ - METASTATIC COLORECTAL ADENOCARCINOMA. - SEE COMMENT.  ASSESSMENT & PLAN:  83 y.o. Caucasian female, with past medical history of colon diverticulosis and arthritis, but otherwise healthy and active, who was found to have a large sigmoid tumor, status post sigmoidectomy.  1. Sigmoid colon adenocarcinoma, pT3N0M0, stage II, G1, MSI stable, ascending colon adenocarcinoma, pT4aN1cM0, stage IIIB, MSI stable, G2, recurrent peritoneal carcinomatosis in 08/2015 -She had multifocal (2) colon cancer status post complete surgical resection,  -I previously reviewed her recent surgical pathology results in great details with her. The right colon cancer has several high-risk features, including stage IIIB, T4a lesion, tumor deposits, lymphovascular invasion, her risk of colon cancer recurrence is high.  -She tried adjuvant chemotherapy Xeloda, but could not tolerate full dose, and last cycle chemotherapy was canceled due to her tolerance issue. -I previously reviewed her recent PET scan from 10/10/2015, which unfortunately showed multiple enlarging, hypermetabolic peritoneal nodules consistent with peritoneal carcinomatosis. No ascites or other metastasis. -I previously discussed her peritoneal mass biopsy, which confirmed metastatic colon cancer  -We previously discussed that her cancer is not curable at this stage, she is not a candidate for debulking and HIPEC, and her goal of care is palliation.  -Her [facility] genomic testing results were discussed with the patient. She has (+) KRAS mutation, not a candidate for EGFR inhibitor   -Her tumor has MSI stable, not a good candidate for immunotherapy alone  -She has started first-line palliative chemotherapy with 5-FU infusion and Avastin, tolerate treatment better w/o 5-fu bolus -We previously reviewed her staging CT scan from 04/17/2016, which showed partial response of her peritoneal  metastasis, no other new lesions. -I reviewed her restaging CT scan image from 07/17/2016 with patient and her friend in person. She unfortunately has had disease progression in the peritoneal metastasis. -We discussed the option of switching her to second line chemotherapy, versus palliative care alone patient would like to try more chemotherapy.   -Due to her advanced age, she is a not a candidate for combined chemotherapy. I recommend second line chemotherapy with Irinotecan, and continue Avastin, every 2 weeks. Starting at low dosage. --Chemotherapy consent: Side effects including but does not not limited to, fatigue, nausea, vomiting, severe diarrhea, hair loss, neuropathy, fluid retention, renal and kidney dysfunction, neutropenic fever, needed for blood transfusion, bleeding, were discussed with patient in great detail. She agrees to proceed. -We discussed that the diarrhea is come form irinotecan, I recommend her to have some Imodium at home. I will call in Lomotil if needed. -will start today  2. Peripheral neuropathy, G 1 -From previous chemotherapy Xeloda, mild and stable now.  3. GERD -she'll continue Protonix.  4.Lung nodules  -She has two small right lung nodules which were discovered on the CT scan in 09/2012,  RUL nodule slightly bigger on recent CT -She has been follow-up with Dr. Cyndia Bent.  -she never smoked, low risk for lung cancer -given the stability of the RUL nodule over 2 years, this is likely a benign lesion   5. Abdominal Pain -She has previously developed some abdominal pain lately, likely related to diarrhea and gas.  -She will take tylenol for the pain -Previously prescribed Tramadol. Take PRN up to 3 times a day.   6. HTN -Slightly elevated previously. No history of HTN, possible related to Avastin  -She will start monitoring and writing down her BP readings  at home.  -I previously started her on amlodipine for her hypertension, she ran out a few days ago, I  refilled for today. Her blood pressure is elevated today.  7. Hair Loss - Patient's hair is starting to thin, secondary to chemo  -OK to use biotin  8. Support/social issue -Her husband's dementia has been progressing and he has become aggressive and dangerous. -I have previously encouraged her to speak to our social worker.  -We previously discussed speaking to his doctor to get medication to help control his aggressive behavior.  -Her daughter would like to put him in a dementia facility.  9.Goal of care discussion  -We again discussed the incurable nature of her cancer, and the overall poor prognosis, especially if she does not have good response to chemotherapy or progress on chemo -The patient understands the goal of care is palliative. -I recommend DNR/DNI, she will think about it   Plan - CT scan reviewed, she has had disease progression in the peritoneum  -Lab reviewed, adequate for treatment, we'll proceed Avastin today and switch 5-fu to Irinotecan,  with dose reduction, and continue every 2 weeks .  -See APP in 2 weeks and see me in 4 weeks  All questions were answered. The patient knows to call the clinic with any problems, questions or concerns.  I spent 20 minutes counseling the patient face to face. The total time spent in the appointment was 25  minutes and more than 50% was on counseling.  This document serves as a record of services personally performed by Truitt Merle, MD. It was created on her behalf by Brandt Loosen, a trained medical scribe. The creation of this record is based on the scribe's personal observations and the provider's statements to them. This document has been checked and approved by the attending provider.   I have reviewed the above documentation for accuracy and completeness, and I agree with the above information.      Truitt Merle  07/19/2016

## 2016-07-17 ENCOUNTER — Ambulatory Visit (HOSPITAL_COMMUNITY)
Admission: RE | Admit: 2016-07-17 | Discharge: 2016-07-17 | Disposition: A | Discharge status: Home / Self Care | Payer: PPO | Source: Ambulatory Visit | Attending: Hematology | Admitting: Hematology

## 2016-07-17 ENCOUNTER — Encounter (HOSPITAL_COMMUNITY): Payer: Self-pay

## 2016-07-17 DIAGNOSIS — C186 Malignant neoplasm of descending colon: Secondary | ICD-10-CM | POA: Diagnosis not present

## 2016-07-17 DIAGNOSIS — C786 Secondary malignant neoplasm of retroperitoneum and peritoneum: Secondary | ICD-10-CM | POA: Diagnosis not present

## 2016-07-17 DIAGNOSIS — I700 Atherosclerosis of aorta: Secondary | ICD-10-CM | POA: Insufficient documentation

## 2016-07-17 DIAGNOSIS — C189 Malignant neoplasm of colon, unspecified: Secondary | ICD-10-CM | POA: Diagnosis not present

## 2016-07-17 DIAGNOSIS — R918 Other nonspecific abnormal finding of lung field: Secondary | ICD-10-CM | POA: Diagnosis not present

## 2016-07-17 DIAGNOSIS — R590 Localized enlarged lymph nodes: Secondary | ICD-10-CM | POA: Insufficient documentation

## 2016-07-17 DIAGNOSIS — R911 Solitary pulmonary nodule: Secondary | ICD-10-CM | POA: Diagnosis not present

## 2016-07-17 DIAGNOSIS — I77810 Thoracic aortic ectasia: Secondary | ICD-10-CM | POA: Diagnosis not present

## 2016-07-17 MED ORDER — IOPAMIDOL (ISOVUE-300) INJECTION 61%
100.0000 mL | Freq: Once | INTRAVENOUS | Status: AC | PRN
  Administered 2016-07-17: 100 mL via INTRAVENOUS

## 2016-07-17 MED ORDER — IOPAMIDOL (ISOVUE-300) INJECTION 61%
INTRAVENOUS | Status: AC
  Filled 2016-07-17: qty 100

## 2016-07-19 ENCOUNTER — Ambulatory Visit (HOSPITAL_BASED_OUTPATIENT_CLINIC_OR_DEPARTMENT_OTHER): Payer: PPO | Admitting: Hematology

## 2016-07-19 ENCOUNTER — Other Ambulatory Visit (HOSPITAL_BASED_OUTPATIENT_CLINIC_OR_DEPARTMENT_OTHER): Payer: PPO

## 2016-07-19 ENCOUNTER — Ambulatory Visit (HOSPITAL_BASED_OUTPATIENT_CLINIC_OR_DEPARTMENT_OTHER): Payer: PPO

## 2016-07-19 ENCOUNTER — Ambulatory Visit: Payer: PPO

## 2016-07-19 ENCOUNTER — Telehealth: Payer: Self-pay | Admitting: Hematology

## 2016-07-19 ENCOUNTER — Encounter: Payer: Self-pay | Admitting: Hematology

## 2016-07-19 VITALS — BP 155/70 | HR 60 | Temp 97.4°F | Resp 18 | Ht 63.0 in | Wt 137.0 lb

## 2016-07-19 VITALS — BP 149/79 | HR 62

## 2016-07-19 DIAGNOSIS — Z5111 Encounter for antineoplastic chemotherapy: Secondary | ICD-10-CM

## 2016-07-19 DIAGNOSIS — C182 Malignant neoplasm of ascending colon: Secondary | ICD-10-CM

## 2016-07-19 DIAGNOSIS — C187 Malignant neoplasm of sigmoid colon: Secondary | ICD-10-CM

## 2016-07-19 DIAGNOSIS — R109 Unspecified abdominal pain: Secondary | ICD-10-CM

## 2016-07-19 DIAGNOSIS — K219 Gastro-esophageal reflux disease without esophagitis: Secondary | ICD-10-CM | POA: Diagnosis not present

## 2016-07-19 DIAGNOSIS — Z5112 Encounter for antineoplastic immunotherapy: Secondary | ICD-10-CM | POA: Diagnosis not present

## 2016-07-19 DIAGNOSIS — C186 Malignant neoplasm of descending colon: Secondary | ICD-10-CM

## 2016-07-19 DIAGNOSIS — G620 Drug-induced polyneuropathy: Secondary | ICD-10-CM

## 2016-07-19 DIAGNOSIS — R911 Solitary pulmonary nodule: Secondary | ICD-10-CM

## 2016-07-19 DIAGNOSIS — C786 Secondary malignant neoplasm of retroperitoneum and peritoneum: Secondary | ICD-10-CM | POA: Diagnosis not present

## 2016-07-19 DIAGNOSIS — L658 Other specified nonscarring hair loss: Secondary | ICD-10-CM

## 2016-07-19 DIAGNOSIS — Z95828 Presence of other vascular implants and grafts: Secondary | ICD-10-CM

## 2016-07-19 LAB — COMPREHENSIVE METABOLIC PANEL
ALT: 9 U/L (ref 0–55)
ANION GAP: 7 meq/L (ref 3–11)
AST: 20 U/L (ref 5–34)
Albumin: 3.6 g/dL (ref 3.5–5.0)
Alkaline Phosphatase: 74 U/L (ref 40–150)
BUN: 21.1 mg/dL (ref 7.0–26.0)
CALCIUM: 8.8 mg/dL (ref 8.4–10.4)
CHLORIDE: 108 meq/L (ref 98–109)
CO2: 27 mEq/L (ref 22–29)
CREATININE: 0.9 mg/dL (ref 0.6–1.1)
EGFR: 59 mL/min/{1.73_m2} — ABNORMAL LOW (ref >90)
Glucose: 106 mg/dl (ref 70–140)
Potassium: 4.1 mEq/L (ref 3.5–5.1)
Sodium: 142 mEq/L (ref 136–145)
Total Bilirubin: 0.91 mg/dL (ref 0.20–1.20)
Total Protein: 6.3 g/dL — ABNORMAL LOW (ref 6.4–8.3)

## 2016-07-19 LAB — CBC WITH DIFFERENTIAL/PLATELET
BASO%: 0.8 % (ref 0.0–2.0)
BASOS ABS: 0.1 10*3/uL (ref 0.0–0.1)
EOS%: 4.6 % (ref 0.0–7.0)
Eosinophils Absolute: 0.3 10*3/uL (ref 0.0–0.5)
HEMATOCRIT: 35.5 % (ref 34.8–46.6)
HGB: 11.8 g/dL (ref 11.6–15.9)
LYMPH#: 0.8 10*3/uL — AB (ref 0.9–3.3)
LYMPH%: 13.3 % — ABNORMAL LOW (ref 14.0–49.7)
MCH: 32.5 pg (ref 25.1–34.0)
MCHC: 33.2 g/dL (ref 31.5–36.0)
MCV: 97.8 fL (ref 79.5–101.0)
MONO#: 0.6 10*3/uL (ref 0.1–0.9)
MONO%: 10.5 % (ref 0.0–14.0)
NEUT#: 4.2 10*3/uL (ref 1.5–6.5)
NEUT%: 70.8 % (ref 38.4–76.8)
PLATELETS: 116 10*3/uL — AB (ref 145–400)
RBC: 3.63 10*6/uL — ABNORMAL LOW (ref 3.70–5.45)
RDW: 14.5 % (ref 11.2–14.5)
WBC: 5.9 10*3/uL (ref 3.9–10.3)

## 2016-07-19 MED ORDER — SODIUM CHLORIDE 0.9 % IV SOLN: 10.0000 mg | Freq: Once | INTRAVENOUS | Status: DC

## 2016-07-19 MED ORDER — ATROPINE SULFATE 1 MG/ML IJ SOLN
INTRAMUSCULAR | Status: AC
  Filled 2016-07-19: qty 1

## 2016-07-19 MED ORDER — HEPARIN SOD (PORK) LOCK FLUSH 100 UNIT/ML IV SOLN
500.0000 [IU] | Freq: Once | INTRAVENOUS | Status: AC | PRN
  Administered 2016-07-19: 500 [IU]
  Filled 2016-07-19: qty 5

## 2016-07-19 MED ORDER — DEXAMETHASONE SODIUM PHOSPHATE 10 MG/ML IJ SOLN
INTRAMUSCULAR | Status: AC
  Filled 2016-07-19: qty 1

## 2016-07-19 MED ORDER — SODIUM CHLORIDE 0.9% FLUSH
10.0000 mL | INTRAVENOUS | Status: DC | PRN
  Administered 2016-07-19: 10 mL via INTRAVENOUS
  Filled 2016-07-19: qty 10

## 2016-07-19 MED ORDER — IRINOTECAN HCL CHEMO INJECTION 100 MG/5ML
140.0000 mg/m2 | Freq: Once | INTRAVENOUS | Status: AC
  Administered 2016-07-19: 240 mg via INTRAVENOUS
  Filled 2016-07-19: qty 10

## 2016-07-19 MED ORDER — PALONOSETRON HCL INJECTION 0.25 MG/5ML
INTRAVENOUS | Status: AC
  Filled 2016-07-19: qty 5

## 2016-07-19 MED ORDER — PALONOSETRON HCL INJECTION 0.25 MG/5ML
0.2500 mg | Freq: Once | INTRAVENOUS | Status: AC
  Administered 2016-07-19: 0.25 mg via INTRAVENOUS

## 2016-07-19 MED ORDER — DEXAMETHASONE SODIUM PHOSPHATE 10 MG/ML IJ SOLN
10.0000 mg | Freq: Once | INTRAMUSCULAR | Status: AC
  Administered 2016-07-19: 10 mg via INTRAVENOUS

## 2016-07-19 MED ORDER — SODIUM CHLORIDE 0.9% FLUSH
10.0000 mL | INTRAVENOUS | Status: DC | PRN
  Administered 2016-07-19: 10 mL
  Filled 2016-07-19: qty 10

## 2016-07-19 MED ORDER — SODIUM CHLORIDE 0.9 % IV SOLN
4.8000 mg/kg | Freq: Once | INTRAVENOUS | Status: AC
  Administered 2016-07-19: 300 mg via INTRAVENOUS
  Filled 2016-07-19: qty 12

## 2016-07-19 MED ORDER — SODIUM CHLORIDE 0.9 % IV SOLN
Freq: Once | INTRAVENOUS | Status: AC
  Administered 2016-07-19: 12:00:00 via INTRAVENOUS

## 2016-07-19 NOTE — Patient Instructions (Addendum)
[facility] Discharge Instructions for Patients Receiving Chemotherapy  Today you received the following chemotherapy agents avastin and camptosar(irinotecan).  To help prevent nausea and vomiting after your treatment, we encourage you to take your nausea medication compazine or zofran.  For the first three days -Thurs-Fri-Saturday, please use compazine for nausea.  Starting on Sunday, you may use zofran for nausea.   Also use Immodium as instructed by Dr. Burr Medico for diarrhea.   If you develop nausea and vomiting that is not controlled by your nausea medication, call the clinic.   BELOW ARE SYMPTOMS THAT SHOULD BE REPORTED IMMEDIATELY:  *FEVER GREATER THAN 100.5 F  *CHILLS WITH OR WITHOUT FEVER  NAUSEA AND VOMITING THAT IS NOT CONTROLLED WITH YOUR NAUSEA MEDICATION  *UNUSUAL SHORTNESS OF BREATH  *UNUSUAL BRUISING OR BLEEDING  TENDERNESS IN MOUTH AND THROAT WITH OR WITHOUT PRESENCE OF ULCERS  *URINARY PROBLEMS  *BOWEL PROBLEMS  UNUSUAL RASH Items with * indicate a potential emergency and should be followed up as soon as possible.  Feel free to call the clinic you have any questions or concerns. The clinic phone number is [phone].  Please show the [place] at check-in to the Emergency Department and triage nurse.  Irinotecan injection What is this medicine? IRINOTECAN (ir in oh TEE kan ) is a chemotherapy drug. It is used to treat colon and rectal cancer. This medicine may be used for other purposes; ask your health care provider or pharmacist if you have questions. COMMON BRAND NAME(S): Camptosar What should I tell my health care provider before I take this medicine? They need to know if you have any of these conditions: -blood disorders -dehydration -diarrhea -infection (especially a virus infection such as chickenpox, cold sores, or herpes) -liver disease -low blood counts, like low white cell, platelet, or red cell counts -recent or ongoing  radiation therapy -an unusual or allergic reaction to irinotecan, sorbitol, other chemotherapy, other medicines, foods, dyes, or preservatives -pregnant or trying to get pregnant -breast-feeding How should I use this medicine? This drug is given as an infusion into a vein. It is administered in a hospital or clinic by a specially trained health care professional. Talk to your pediatrician regarding the use of this medicine in children. Special care may be needed. Overdosage: If you think you have taken too much of this medicine contact a poison control center or emergency room at once. NOTE: This medicine is only for you. Do not share this medicine with others. What if I miss a dose? It is important not to miss your dose. Call your doctor or health care professional if you are unable to keep an appointment. What may interact with this medicine? Do not take this medicine with any of the following medications: -atazanavir -certain medicines for fungal infections like itraconazole and ketoconazole -St. John's Wort This medicine may also interact with the following medications: -dexamethasone -diuretics -laxatives -medicines for seizures like carbamazepine, mephobarbital, phenobarbital, phenytoin, primidone -medicines to increase blood counts like filgrastim, pegfilgrastim, sargramostim -prochlorperazine -vaccines This list may not describe all possible interactions. Give your health care provider a list of all the medicines, herbs, non-prescription drugs, or dietary supplements you use. Also tell them if you smoke, drink alcohol, or use illegal drugs. Some items may interact with your medicine. What should I watch for while using this medicine? Your condition will be monitored carefully while you are receiving this medicine. You will need important blood work done while you are taking this medicine.  This drug may make you feel generally unwell. This is not uncommon, as chemotherapy can affect  healthy cells as well as cancer cells. Report any side effects. Continue your course of treatment even though you feel ill unless your doctor tells you to stop. In some cases, you may be given additional medicines to help with side effects. Follow all directions for their use. You may get drowsy or dizzy. Do not drive, use machinery, or do anything that needs mental alertness until you know how this medicine affects you. Do not stand or sit up quickly, especially if you are an older patient. This reduces the risk of dizzy or fainting spells. Call your doctor or health care professional for advice if you get a fever, chills or sore throat, or other symptoms of a cold or flu. Do not treat yourself. This drug decreases your body's ability to fight infections. Try to avoid being around people who are sick. This medicine may increase your risk to bruise or bleed. Call your doctor or health care professional if you notice any unusual bleeding. Be careful brushing and flossing your teeth or using a toothpick because you may get an infection or bleed more easily. If you have any dental work done, tell your dentist you are receiving this medicine. Avoid taking products that contain aspirin, acetaminophen, ibuprofen, naproxen, or ketoprofen unless instructed by your doctor. These medicines may hide a fever. Do not become pregnant while taking this medicine. Women should inform their doctor if they wish to become pregnant or think they might be pregnant. There is a potential for serious side effects to an unborn child. Talk to your health care professional or pharmacist for more information. Do not breast-feed an infant while taking this medicine. What side effects may I notice from receiving this medicine? Side effects that you should report to your doctor or health care professional as soon as possible: -allergic reactions like skin rash, itching or hives, swelling of the face, lips, or tongue -low blood counts -  this medicine may decrease the number of white blood cells, red blood cells and platelets. You may be at increased risk for infections and bleeding. -signs of infection - fever or chills, cough, sore throat, pain or difficulty passing urine -signs of decreased platelets or bleeding - bruising, pinpoint red spots on the skin, black, tarry stools, blood in the urine -signs of decreased red blood cells - unusually weak or tired, fainting spells, lightheadedness -breathing problems -chest pain -diarrhea -feeling faint or lightheaded, falls -flushing, runny nose, sweating during infusion -mouth sores or pain -pain, swelling, redness or irritation where injected -pain, swelling, warmth in the leg -pain, tingling, numbness in the hands or feet -problems with balance, talking, walking -stomach cramps, pain -trouble passing urine or change in the amount of urine -vomiting as to be unable to hold down drinks or food -yellowing of the eyes or skin Side effects that usually do not require medical attention (report to your doctor or health care professional if they continue or are bothersome): -constipation -hair loss -headache -loss of appetite -nausea, vomiting -stomach upset This list may not describe all possible side effects. Call your doctor for medical advice about side effects. You may report side effects to FDA at 1-800-FDA-1088. Where should I keep my medicine? This drug is given in a hospital or clinic and will not be stored at home. NOTE: This sheet is a summary. It may not cover all possible information. If you have questions about this  medicine, talk to your doctor, pharmacist, or health care provider.  2018 [employer] (2012-07-28 16:29:32)

## 2016-07-19 NOTE — Telephone Encounter (Signed)
Appointments scheduled per 07/19/16 los. Patient was given a copy of the AVS report and appointment schedule, per 07/19/16 los. °

## 2016-07-20 ENCOUNTER — Telehealth: Payer: Self-pay | Admitting: *Deleted

## 2016-07-20 NOTE — Telephone Encounter (Signed)
-----   Message from Ignacia Felling, RN sent at 07/19/2016 12:15 PM EDT ----- Regarding: chemo follow up Call  /Dr. Burr Medico Ist Irinotecan (has had avastin before)   Dr. Burr Medico.  Patient phone [phone]

## 2016-07-20 NOTE — Telephone Encounter (Signed)
Called pt to check to see how she did post irinotecan.  She reports no problems.  She states she hasn't had a BM today.  Instructed to drink plenty of fluids & eat fruits/vegetables & if she has to take something, just a stool softener to start with.  Irinotecan can cause diarrhea so want to be cautious.  Instructed to call if symptom persist or has any other questions or concerns.  Pt expressed understanding & appreciation.

## 2016-07-22 ENCOUNTER — Encounter: Payer: Self-pay | Admitting: Hematology

## 2016-07-23 DIAGNOSIS — R197 Diarrhea, unspecified: Secondary | ICD-10-CM | POA: Diagnosis not present

## 2016-07-23 DIAGNOSIS — C786 Secondary malignant neoplasm of retroperitoneum and peritoneum: Secondary | ICD-10-CM | POA: Diagnosis not present

## 2016-07-23 DIAGNOSIS — C187 Malignant neoplasm of sigmoid colon: Secondary | ICD-10-CM | POA: Diagnosis not present

## 2016-07-23 DIAGNOSIS — D701 Agranulocytosis secondary to cancer chemotherapy: Secondary | ICD-10-CM | POA: Diagnosis not present

## 2016-07-24 ENCOUNTER — Telehealth: Payer: Self-pay | Admitting: Hematology

## 2016-07-24 NOTE — Telephone Encounter (Signed)
Per 6/10 schedule message added f/u with Dr. Lebron Conners to 6/21 treatment. Patient will see Dr. Lebron Conners in Dr. Ernestina Penna absence. Left message for patient and mailed updated June schedule.

## 2016-08-02 ENCOUNTER — Ambulatory Visit: Payer: PPO

## 2016-08-02 ENCOUNTER — Other Ambulatory Visit (HOSPITAL_BASED_OUTPATIENT_CLINIC_OR_DEPARTMENT_OTHER): Payer: PPO

## 2016-08-02 ENCOUNTER — Ambulatory Visit (HOSPITAL_BASED_OUTPATIENT_CLINIC_OR_DEPARTMENT_OTHER): Payer: PPO | Admitting: Hematology and Oncology

## 2016-08-02 ENCOUNTER — Other Ambulatory Visit (HOSPITAL_COMMUNITY)
Admission: RE | Admit: 2016-08-02 | Discharge: 2016-08-02 | Disposition: A | Discharge status: Home / Self Care | Payer: PPO | Source: Ambulatory Visit | Attending: Hematology and Oncology | Admitting: Hematology and Oncology

## 2016-08-02 ENCOUNTER — Telehealth: Payer: Self-pay | Admitting: Hematology and Oncology

## 2016-08-02 ENCOUNTER — Encounter: Payer: Self-pay | Admitting: Hematology and Oncology

## 2016-08-02 VITALS — BP 135/73 | HR 79 | Temp 98.4°F | Resp 18 | Ht 63.0 in | Wt 132.3 lb

## 2016-08-02 DIAGNOSIS — C182 Malignant neoplasm of ascending colon: Secondary | ICD-10-CM

## 2016-08-02 DIAGNOSIS — C801 Malignant (primary) neoplasm, unspecified: Secondary | ICD-10-CM

## 2016-08-02 DIAGNOSIS — C186 Malignant neoplasm of descending colon: Secondary | ICD-10-CM

## 2016-08-02 DIAGNOSIS — R197 Diarrhea, unspecified: Secondary | ICD-10-CM

## 2016-08-02 DIAGNOSIS — D701 Agranulocytosis secondary to cancer chemotherapy: Secondary | ICD-10-CM | POA: Diagnosis not present

## 2016-08-02 DIAGNOSIS — C187 Malignant neoplasm of sigmoid colon: Secondary | ICD-10-CM

## 2016-08-02 DIAGNOSIS — C786 Secondary malignant neoplasm of retroperitoneum and peritoneum: Secondary | ICD-10-CM

## 2016-08-02 DIAGNOSIS — Z95828 Presence of other vascular implants and grafts: Secondary | ICD-10-CM

## 2016-08-02 DIAGNOSIS — Z5111 Encounter for antineoplastic chemotherapy: Secondary | ICD-10-CM

## 2016-08-02 LAB — CBC WITH DIFFERENTIAL/PLATELET
BASO%: 1.2 % (ref 0.0–2.0)
Basophils Absolute: 0 10*3/uL (ref 0.0–0.1)
EOS ABS: 0.2 10*3/uL (ref 0.0–0.5)
EOS%: 10.4 % — ABNORMAL HIGH (ref 0.0–7.0)
HCT: 34.3 % — ABNORMAL LOW (ref 34.8–46.6)
HGB: 11.6 g/dL (ref 11.6–15.9)
LYMPH%: 31.7 % (ref 14.0–49.7)
MCH: 32.5 pg (ref 25.1–34.0)
MCHC: 33.9 g/dL (ref 31.5–36.0)
MCV: 95.8 fL (ref 79.5–101.0)
MONO#: 0.5 10*3/uL (ref 0.1–0.9)
MONO%: 32 % — AB (ref 0.0–14.0)
NEUT%: 24.7 % — ABNORMAL LOW (ref 38.4–76.8)
NEUTROS ABS: 0.4 10*3/uL — AB (ref 1.5–6.5)
Platelets: 189 10*3/uL (ref 145–400)
RBC: 3.58 10*6/uL — AB (ref 3.70–5.45)
RDW: 14.1 % (ref 11.2–14.5)
WBC: 1.7 10*3/uL — ABNORMAL LOW (ref 3.9–10.3)
lymph#: 0.5 10*3/uL — ABNORMAL LOW (ref 0.9–3.3)

## 2016-08-02 LAB — COMPREHENSIVE METABOLIC PANEL
ALT: 12 U/L (ref 0–55)
AST: 19 U/L (ref 5–34)
Albumin: 3.3 g/dL — ABNORMAL LOW (ref 3.5–5.0)
Alkaline Phosphatase: 123 U/L (ref 40–150)
Anion Gap: 11 mEq/L (ref 3–11)
BILIRUBIN TOTAL: 1.21 mg/dL — AB (ref 0.20–1.20)
BUN: 16.6 mg/dL (ref 7.0–26.0)
CO2: 25 meq/L (ref 22–29)
Calcium: 8.9 mg/dL (ref 8.4–10.4)
Chloride: 106 mEq/L (ref 98–109)
Creatinine: 0.9 mg/dL (ref 0.6–1.1)
EGFR: 63 mL/min/{1.73_m2} — ABNORMAL LOW (ref >90)
Glucose: 104 mg/dl (ref 70–140)
POTASSIUM: 3.6 meq/L (ref 3.5–5.1)
SODIUM: 142 meq/L (ref 136–145)
TOTAL PROTEIN: 6.4 g/dL (ref 6.4–8.3)

## 2016-08-02 LAB — CEA (IN HOUSE-CHCC): CEA (CHCC-In House): 314.33 ng/mL — ABNORMAL HIGH (ref 0.00–5.00)

## 2016-08-02 LAB — UA PROTEIN, DIPSTICK - CHCC

## 2016-08-02 MED ORDER — SODIUM CHLORIDE 0.9% FLUSH
10.0000 mL | INTRAVENOUS | Status: DC | PRN
  Administered 2016-08-02: 10 mL via INTRAVENOUS
  Filled 2016-08-02: qty 10

## 2016-08-02 MED ORDER — HEPARIN SOD (PORK) LOCK FLUSH 100 UNIT/ML IV SOLN
500.0000 [IU] | Freq: Once | INTRAVENOUS | Status: AC | PRN
  Administered 2016-08-02: 500 [IU] via INTRAVENOUS
  Filled 2016-08-02: qty 5

## 2016-08-02 NOTE — Assessment & Plan Note (Addendum)
82-year-old female with stage IV adenocarcinoma of the colon, MSI-low, KRAS-mutant. Recently progressed on treatment with 5-FU and bevacizumab and was started on the next line of treatment with Irinotecan and bevacizumab 2 weeks ago.  Patient has developed grade 1 nausea that easily response to antiemetics as well as Grade 2 diarrhea based on CTCAE 5.0. Patient reports that her quality of life is reasonable overall at this time. Lab work today demonstrates Grade 1 hyperbilirubinemia as well as neutropenia with absolute neutrophil count of 0.4.  Plan: --No chemotherapy today due to neutropenia. Will delay treatment by one week --Check stool for C diff, but diarrhea is most likely due to Irinotecan effect. If negative, will consider using antimotility agents --RTC in 1 week -- labs, clinic visit and possible C2D1 chemotherapy   

## 2016-08-02 NOTE — Progress Notes (Addendum)
Patient on plan of care prior to pathways. 

## 2016-08-02 NOTE — Progress Notes (Signed)
[facility] Follow-up Visit:  Assessment: Cancer of right colon [facility]) 83 year old female with stage IV adenocarcinoma of the colon, MSI-low, KRAS-mutant. Recently progressed on treatment with 5-FU and bevacizumab and was started on the next line of treatment with Irinotecan and bevacizumab 2 weeks ago.  Patient has developed grade 1 nausea that easily response to antiemetics as well as Grade 2 diarrhea based on CTCAE 5.0. Patient reports that her quality of life is reasonable overall at this time. Lab work today demonstrates Grade 1 hyperbilirubinemia as well as neutropenia with absolute neutrophil count of 0.4.  Plan: --No chemotherapy today due to neutropenia. Will delay treatment by one week --Check stool for C diff, but diarrhea is most likely due to Irinotecan effect. If negative, will consider using antimotility agents --RTC in 1 week -- labs, clinic visit and possible C2D1 chemotherapy     Orders Placed This Encounter  Procedures  . Fecal, C-Dff PCR    Order Specific Question:   Is your patient experiencing loose or watery stools (3 or more in 24 hours)?    Answer:   Yes    Order Specific Question:   Has the patient received laxatives in the last 24 hours?    Answer:   No    Order Specific Question:   Has a negative Cdiff test resulted in the last 7 days?    Answer:   No  . CBC with Differential    Standing Status:   Future    Standing Expiration Date:   08/02/2017  . Comprehensive metabolic panel    Standing Status:   Future    Standing Expiration Date:   08/02/2017    Cancer Staging Cancer of left colon [facility]) Staging form: Colon and Rectum, AJCC 7th Edition - Pathologic stage from 05/17/2014: Stage IIA (T3, N0, cM0) - Signed by Truitt Merle, MD on 02/21/2015  Cancer of right colon [facility]) Staging form: Colon and Rectum, AJCC 7th Edition - Pathologic stage from 12/08/2014: Stage IIIB (T4a, N1a, cM0) - Signed by Truitt Merle, MD on 02/21/2015   All questions were  answered. . The patient knows to call the clinic with any problems, questions or concerns.  This note was electronically signed.    History of Presenting Illness Heidi Marquez 83 y.o. presenting to the [place] for management of metastatic with peritoneal carcinomatosis with progression of the disease while receiving 5-FU/bevacizumab therapy. At the last visit to the clinic, patient was transitioned to treatment with irinotecan and bevacizumab and received the therapy on 07/19/16.  Patient returns to clinic for possible cycle 2 of systemic palliative therapy. Since last visit, patient has had intermittent nausea which was well relieved by her current nausea medications. She also had small amounts of diarrhea with 5-6 small bowel movements per day with occasional blood on the toilet tissue, but never in the toilet bowl. Denies significant lightheadedness, dizziness, or weakness. No chest pain, shortness of breath, or cough. Denies or hematuria.   Oncological/hematological History: Oncology History   Cancer of left colon [facility])   Staging form: Colon and Rectum, AJCC 7th Edition     Pathologic stage from 05/17/2014: Stage IIA (T3, N0, cM0) - Signed by Truitt Merle, MD on 02/21/2015 Cancer of right colon [facility])   Staging form: Colon and Rectum, AJCC 7th Edition     Pathologic stage from 12/08/2014: Stage IIIB (T4a, N1a, cM0) - Signed by Truitt Merle, MD on 02/21/2015       Cancer of right  colon ([place])   04/09/2014 Procedure    Colonoscopy per Dr. Silvano Rusk: Large fleshy mass sigmoid colon 25 cm from anus. Could not pass through.      04/09/2014 Tumor Marker    CEA =11.7      04/12/2014 Imaging    CT C/A/P:sigmoid colon mass;several small areas of soft tissue nodularity in peritoneal cavity; small attenuation structure posterior right hepatic lobe; 2 stable nodules in lungs dating back to 2014. No acute findings.      05/17/2014 Initial Diagnosis    Colon cancer-presented with heme + stool and  more frequent BMs      05/17/2014 Surgery    Robot assisted sigmoidectomy      05/17/2014 Pathologic Stage    pT3,pN0,pMX--Grade 1--0/13 nodes +--MMR normal      05/17/2014 Clinical Stage    Stage II      12/08/2014 Surgery    right hemicolectomy       12/08/2014 Pathology Results    right colon adenocarcinoma, pT4aN1c, 19 nodes were negative, LVI(+), MMR normal       01/17/2015 - 06/27/2015 Chemotherapy    adjuvant capecitabine 1575m q12h, 2 weeks on and 1 week off. dose reduction and stopped after 6 cycles due to poor tolerance,       08/15/2015 Progression    CT abdomen showed peritoneal nodules, highly suspicious for recurrent metastasis      10/10/2015 Imaging    PET scan showed multiple enlarging, hypermetabolic peritoneal nodules consistent with peritoneal carcinomatosis. No other evidence of metastasis      11/04/2015 Pathology Results    Diagnosis 11/04/2015 Peritoneum, biopsy, RLQ - METASTATIC COLORECTAL ADENOCARCINOMA. - SEE COMMENT. -[facility]      02/02/2016 Imaging    CT CAP W CONTRAST 02/02/2016 IMPRESSION: 1. Extensive worsening of peritoneal spread of tumor with numerous large new tumor nodules scattered throughout The upper omentum and also scattered within along the mesentery. Mild extension into the right retroperitoneum at the level of the iliac crest. 2. Slight reduction in size of the right upper lobe pulmonary nodule 1.1 cm, previously 1.2 cm. Stable indistinct ground-glass density nodule in the right upper lobe. 3. Airway thickening is present, suggesting bronchitis or reactive airways disease. 4. Ascending aortic aneurysm 4.0 cm in diameter. Recommend annual imaging followup by CTA or MRA. This recommendation follows 2010 ACCF/AHA/AATS/ACR/ASA/SCA/SCAI/SIR/STS/SVM Guidelines for the Diagnosis and Management of Patients with Thoracic Aortic Disease. Circulation. 2010; 121: [id number] 5. Cholelithiasis.      02/08/2016 -  Chemotherapy     5-fu infusion and Avastin every 2 weeks        04/17/2016 Imaging    CT CAP  IMPRESSION: 1. Slight interval improvement in extensive peritoneal carcinomatosis compared with prior CT from 02/02/2016. No definite disease progression. 2. No evidence of thoracic or hepatic metastases. 3. No definite bowel wall invasion, ascites or bowel obstruction. 4. Cholelithiasis. 5. Stable mild pulmonary nodularity, likely benign.      07/17/2016 Imaging    CT CAP w contrast IMPRESSION: 1. Today's study demonstrates progressive peritoneal carcinomatosis, as detailed above. Mildly enlarged hepatoduodenal ligament lymph node also noted. 2. No other metastatic disease noted elsewhere in the chest (previously noted right upper lobe pulmonary nodule remains stable over several prior examinations), abdomen or pelvis. 3. Aortic atherosclerosis, with ectasia of ascending thoracic aorta (4.3 cm in diameter). Recommend annual imaging followup by CTA or MRA. This recommendation follows 2010       07/19/2016 -  Chemotherapy    Irinotecan +  bevacizumab Q14d --Cycle #1, 07/19/16 --Cycle #2, ...: delayed due to neutropenia (ANC 0.4 on 08/02/16)        Medical History: Past Medical History:  Diagnosis Date  . Adenocarcinoma of colon [facility]) oncologist-  dr Truitt Merle---  last chemo 01-17-2015 to 06-27-2015)   dx 05-17-2014 sigmoid (left) cancer, Stage 2, G1 (pT3N0M0), MSI stable -- s/p  sigmoidectomy /  dx 12-08-2014 ascending colon cancer (right) , Stage 3B, G2, (pT4aN1cM0), MSI stable -- s/p  right hemicolectomy/  08-2015  recurrent peritoneal carcinomatosis  . Arthritis    knees  . Coronary atherosclerosis   . Diverticulosis of colon   . GERD (gastroesophageal reflux disease)   . HH (hiatus hernia)   . History of basal cell carcinoma excision    right side nose 2013  s/p  moh's  . History of epistaxis    2005  caurterized twice  . History of esophageal dilatation    2009 for stricture  . History of  esophagitis    reflux  . Internal hemorrhoids   . Macular degeneration of both eyes   . Multinodular thyroid    remote hx benign bx 2005  . Peritoneal carcinomatosis ([place])    secondary to primary colon cancer  . Pulmonary nodule, right    noted since 2014-- stable per last ct 06-15-2015  . Thoracic ascending aortic aneurysm [facility])    followed by dr Cyndia Bent ([facility])  last ct 06-15-2015  4.3cm    Surgical History: Past Surgical History:  Procedure Laterality Date  . COLONOSCOPY WITH PROPOFOL N/A 09/30/2014   Procedure: COLONOSCOPY WITH PROPOFOL;  Surgeon: Milus Banister, MD;  Location: [facility];  Service: Endoscopy;  Laterality: N/A;  . ESOPHAGOGASTRODUODENOSCOPY  2009  . INGUINAL HERNIA REPAIR Right early 1990s  . MOHS SURGERY  11/2011   nose  . PORTACATH PLACEMENT Right 01/24/2016   Procedure: INSERTION PORT-A-CATH;  Surgeon: Leighton Ruff, MD;  Location[facility];  Service: General;  Laterality: Right;  . ROBOTIC ASSISTED RIGHT HEMICOLECTOMY  09/47/0962    dr Leighton Ruff  . ROBOTIC ASSISTED SIGMOIDECTOMY  83/66/2947   dr Leighton Ruff  . UPPER GASTROINTESTINAL ENDOSCOPY    . VAGINAL HYSTERECTOMY  age 49   partial with the bladder tack    Family History: Family History  Problem Relation Age of Onset  . Colon cancer Paternal Grandfather 61       colon cancer   . Stroke Father   . Heart attack Son 4       died of heart attack   . Esophageal cancer Neg Hx   . Rectal cancer Neg Hx   . Stomach cancer Neg Hx     Social History: Social History   Social History  . Marital status: Married    Spouse name: N/A  . Number of children: N/A  . Years of education: N/A   Occupational History  . Not on file.   Social History Main Topics  . Smoking status: Never Smoker  . Smokeless tobacco: Never Used  . Alcohol use No  . Drug use: No  . Sexual activity: Not on file   Other Topics Concern  . Not on file   Social History Narrative   Married,  husband is disabled (physical and dementia) and she is caregiver   Has #1 child (daughter) living. Son died of MI at age 62   Worked at [employer] before she retired   Has #3 cats    Allergies: Allergies  Allergen Reactions  . Sulfa Antibiotics Swelling    Eyes,mouth  . Amoxicillin-Pot Clavulanate Rash  . Penicillins Swelling and Rash    Medications:  Current Outpatient Prescriptions  Medication Sig Dispense Refill  . alendronate (FOSAMAX) 70 MG tablet Take 70 mg by mouth every 7 (seven) days. Sundays. Take with a full glass of water on an empty stomach.    Marland Kitchen amLODipine (NORVASC) 5 MG tablet Take 1 tablet (5 mg total) by mouth daily. 30 tablet 2  . Ascorbic Acid (VITAMIN C) 1000 MG tablet Take 1,000 mg by mouth daily.    . Biotin 1000 MCG tablet Take 1,000 mcg by mouth daily.     Marland Kitchen CALCIUM & MAGNESIUM CARBONATES PO Take 1 tablet by mouth daily.    . cholecalciferol (VITAMIN D) 1000 UNITS tablet Take 1,000 Units by mouth daily.    . diphenoxylate-atropine (LOMOTIL) 2.5-0.025 MG tablet Take 1-2 tablets by mouth 4 (four) times daily as needed for diarrhea or loose stools. 30 tablet 2  . glucosamine-chondroitin 500-400 MG tablet Take 1 tablet by mouth daily.     . Homeopathic Products ([employer] LEG CRAMPS PAIN RELIEF PO) Take 1 tablet by mouth daily as needed (for leg cramps).    Marland Kitchen ibuprofen (ADVIL,MOTRIN) 200 MG tablet Take 200 mg by mouth daily as needed for moderate pain.     Marland Kitchen lidocaine-prilocaine (EMLA) cream Apply to affected area once 30 g 3  . loratadine (CLARITIN) 10 MG tablet Take 10 mg by mouth daily.    . Multiple Vitamin (MULTIVITAMIN) tablet Take 1 tablet by mouth daily.    . ondansetron (ZOFRAN) 8 MG tablet Take 1 tablet (8 mg total) by mouth every 8 (eight) hours as needed (Nausea or vomiting). 30 tablet 1  . potassium chloride SA (K-DUR,KLOR-CON) 20 MEQ tablet Take 1 tablet (20 mEq total) by mouth daily. (Patient taking differently: Take 20 mEq by mouth every other day.  ) 20 tablet 1  . prochlorperazine (COMPAZINE) 10 MG tablet Take 1 tablet (10 mg total) by mouth every 8 (eight) hours as needed for nausea (Nausea or vomiting). 30 tablet 0  . ranitidine (ZANTAC) 150 MG tablet Take 150 mg by mouth as needed for heartburn.    . traMADol (ULTRAM) 50 MG tablet Take 1 tablet (50 mg total) by mouth every 6 (six) hours as needed. 30 tablet 1  . urea (CARMOL) 10 % cream Apply topically 2 (two) times daily. Apply to hand twice daily 142 g 0  . vitamin B-12 (CYANOCOBALAMIN) 1000 MCG tablet Take 1,000 mcg by mouth daily.      No current facility-administered medications for this visit.    Facility-Administered Medications Ordered in Other Visits  Medication Dose Route Frequency Provider Last Rate Last Dose  . sodium chloride flush (NS) 0.9 % injection 10 mL  10 mL Intracatheter PRN Truitt Merle, MD   10 mL at 07/19/16 1515    Review of Systems: Review of Systems  All other systems reviewed and are negative.    PHYSICAL EXAMINATION Blood pressure 135/73, pulse 79, temperature 98.4 F (36.9 C), temperature source Oral, resp. rate 18, height '5\' 3"'  (1.6 m), weight 132 lb 4.8 oz (60 kg), SpO2 100 %.  ECOG PERFORMANCE STATUS: 1 - Symptomatic but completely ambulatory  Physical Exam  Constitutional: She is oriented to person, place, and time. No distress.  Elderly frail female.  HENT:  Mouth/Throat: Oropharynx is clear and moist. No oropharyngeal exudate.  No oral sores  Eyes: EOM are  normal. Pupils are equal, round, and reactive to light. Right eye exhibits no discharge. Left eye exhibits no discharge. No scleral icterus.  Cardiovascular: Normal rate and regular rhythm.   Murmur heard. Faint early systolic murmur best heard over the aortic projection  Pulmonary/Chest: Breath sounds normal. No respiratory distress. She has no wheezes.  Abdominal: Soft. She exhibits no distension. There is tenderness.  Mild tenderness in the right lower quadrant without rebound.  Remainder of the abdomen without significant tenderness.  Musculoskeletal: She exhibits no edema.  Lymphadenopathy:    She has no cervical adenopathy.  Neurological: She is alert and oriented to person, place, and time.  Skin: Skin is warm and dry. No rash noted. She is not diaphoretic. No erythema. There is pallor.     LABORATORY DATA: I have personally reviewed the data as listed: Appointment on 08/02/2016  Component Date Value Ref Range Status  . Protein, ur 08/02/2016 < 30  Negative- <30 mg/dL Final  . WBC 08/02/2016 1.7* 3.9 - 10.3 10e3/uL Final  . NEUT# 08/02/2016 0.4* 1.5 - 6.5 10e3/uL Final  . HGB 08/02/2016 11.6  11.6 - 15.9 g/dL Final  . HCT 08/02/2016 34.3* 34.8 - 46.6 % Final  . Platelets 08/02/2016 189  145 - 400 10e3/uL Final  . MCV 08/02/2016 95.8  79.5 - 101.0 fL Final  . MCH 08/02/2016 32.5  25.1 - 34.0 pg Final  . MCHC 08/02/2016 33.9  31.5 - 36.0 g/dL Final  . RBC 08/02/2016 3.58* 3.70 - 5.45 10e6/uL Final  . RDW 08/02/2016 14.1  11.2 - 14.5 % Final  . lymph# 08/02/2016 0.5* 0.9 - 3.3 10e3/uL Final  . MONO# 08/02/2016 0.5  0.1 - 0.9 10e3/uL Final  . Eosinophils Absolute 08/02/2016 0.2  0.0 - 0.5 10e3/uL Final  . Basophils Absolute 08/02/2016 0.0  0.0 - 0.1 10e3/uL Final  . NEUT% 08/02/2016 24.7* 38.4 - 76.8 % Final  . LYMPH% 08/02/2016 31.7  14.0 - 49.7 % Final  . MONO% 08/02/2016 32.0* 0.0 - 14.0 % Final  . EOS% 08/02/2016 10.4* 0.0 - 7.0 % Final  . BASO% 08/02/2016 1.2  0.0 - 2.0 % Final  . Sodium 08/02/2016 142  136 - 145 mEq/L Final  . Potassium 08/02/2016 3.6  3.5 - 5.1 mEq/L Final  . Chloride 08/02/2016 106  98 - 109 mEq/L Final  . CO2 08/02/2016 25  22 - 29 mEq/L Final  . Glucose 08/02/2016 104  70 - 140 mg/dl Final   Glucose reference range is for nonfasting patients. Fasting glucose reference range is 70- 100.  Marland Kitchen BUN 08/02/2016 16.6  7.0 - 26.0 mg/dL Final  . Creatinine 08/02/2016 0.9  0.6 - 1.1 mg/dL Final  . Total Bilirubin 08/02/2016 1.21* 0.20 -  1.20 mg/dL Final  . Alkaline Phosphatase 08/02/2016 123  40 - 150 U/L Final  . AST 08/02/2016 19  5 - 34 U/L Final  . ALT 08/02/2016 12  0 - 55 U/L Final  . Total Protein 08/02/2016 6.4  6.4 - 8.3 g/dL Final  . Albumin 08/02/2016 3.3* 3.5 - 5.0 g/dL Final  . Calcium 08/02/2016 8.9  8.4 - 10.4 mg/dL Final  . Anion Gap 08/02/2016 11  3 - 11 mEq/L Final  . EGFR 08/02/2016 63* >90 ml/min/1.73 m2 Final   eGFR is calculated using the CKD-EPI Creatinine Equation (2009)       Ardath Sax, MD

## 2016-08-02 NOTE — Telephone Encounter (Signed)
Scheduled appt per 6/21 los - Gave patient AVS and calender per los.  

## 2016-08-02 NOTE — Progress Notes (Unsigned)
CDIFF specimen for Heidi Marquez is canceled by [facility] . Stool was formed. notified RN loren AT 12PM 08/02/16.ku

## 2016-08-09 ENCOUNTER — Ambulatory Visit (HOSPITAL_BASED_OUTPATIENT_CLINIC_OR_DEPARTMENT_OTHER): Payer: PPO | Admitting: Hematology and Oncology

## 2016-08-09 ENCOUNTER — Telehealth: Payer: Self-pay | Admitting: Hematology and Oncology

## 2016-08-09 ENCOUNTER — Encounter: Payer: Self-pay | Admitting: Hematology and Oncology

## 2016-08-09 ENCOUNTER — Ambulatory Visit (HOSPITAL_BASED_OUTPATIENT_CLINIC_OR_DEPARTMENT_OTHER): Payer: PPO

## 2016-08-09 ENCOUNTER — Other Ambulatory Visit (HOSPITAL_BASED_OUTPATIENT_CLINIC_OR_DEPARTMENT_OTHER): Payer: PPO

## 2016-08-09 VITALS — BP 120/76 | HR 68 | Temp 97.1°F | Resp 18 | Ht 63.0 in | Wt 134.4 lb

## 2016-08-09 VITALS — BP 154/72

## 2016-08-09 DIAGNOSIS — Z5112 Encounter for antineoplastic immunotherapy: Secondary | ICD-10-CM

## 2016-08-09 DIAGNOSIS — C182 Malignant neoplasm of ascending colon: Secondary | ICD-10-CM

## 2016-08-09 DIAGNOSIS — Z5111 Encounter for antineoplastic chemotherapy: Secondary | ICD-10-CM

## 2016-08-09 DIAGNOSIS — C187 Malignant neoplasm of sigmoid colon: Secondary | ICD-10-CM

## 2016-08-09 DIAGNOSIS — C786 Secondary malignant neoplasm of retroperitoneum and peritoneum: Secondary | ICD-10-CM

## 2016-08-09 DIAGNOSIS — C186 Malignant neoplasm of descending colon: Secondary | ICD-10-CM

## 2016-08-09 DIAGNOSIS — C801 Malignant (primary) neoplasm, unspecified: Secondary | ICD-10-CM

## 2016-08-09 LAB — COMPREHENSIVE METABOLIC PANEL
ALT: 11 U/L (ref 0–55)
AST: 19 U/L (ref 5–34)
Albumin: 3.3 g/dL — ABNORMAL LOW (ref 3.5–5.0)
Alkaline Phosphatase: 100 U/L (ref 40–150)
Anion Gap: 8 mEq/L (ref 3–11)
BUN: 14.9 mg/dL (ref 7.0–26.0)
CHLORIDE: 108 meq/L (ref 98–109)
CO2: 27 mEq/L (ref 22–29)
Calcium: 9.3 mg/dL (ref 8.4–10.4)
Creatinine: 0.9 mg/dL (ref 0.6–1.1)
EGFR: 64 mL/min/{1.73_m2} — ABNORMAL LOW (ref >90)
GLUCOSE: 100 mg/dL (ref 70–140)
POTASSIUM: 4.3 meq/L (ref 3.5–5.1)
SODIUM: 143 meq/L (ref 136–145)
Total Bilirubin: 0.4 mg/dL (ref 0.20–1.20)
Total Protein: 6.3 g/dL — ABNORMAL LOW (ref 6.4–8.3)

## 2016-08-09 LAB — CBC WITH DIFFERENTIAL/PLATELET
BASO%: 1.3 % (ref 0.0–2.0)
BASOS ABS: 0.1 10*3/uL (ref 0.0–0.1)
EOS%: 3.9 % (ref 0.0–7.0)
Eosinophils Absolute: 0.3 10*3/uL (ref 0.0–0.5)
HCT: 34.4 % — ABNORMAL LOW (ref 34.8–46.6)
HGB: 11.7 g/dL (ref 11.6–15.9)
LYMPH%: 10.9 % — ABNORMAL LOW (ref 14.0–49.7)
MCH: 32.8 pg (ref 25.1–34.0)
MCHC: 34.1 g/dL (ref 31.5–36.0)
MCV: 96.2 fL (ref 79.5–101.0)
MONO#: 0.5 10*3/uL (ref 0.1–0.9)
MONO%: 8.5 % (ref 0.0–14.0)
NEUT#: 4.9 10*3/uL (ref 1.5–6.5)
NEUT%: 75.4 % (ref 38.4–76.8)
Platelets: 253 10*3/uL (ref 145–400)
RBC: 3.57 10*6/uL — ABNORMAL LOW (ref 3.70–5.45)
RDW: 13.9 % (ref 11.2–14.5)
WBC: 6.5 10*3/uL (ref 3.9–10.3)
lymph#: 0.7 10*3/uL — ABNORMAL LOW (ref 0.9–3.3)

## 2016-08-09 MED ORDER — HEPARIN SOD (PORK) LOCK FLUSH 100 UNIT/ML IV SOLN
500.0000 [IU] | Freq: Once | INTRAVENOUS | Status: AC | PRN
  Administered 2016-08-09: 500 [IU]
  Filled 2016-08-09: qty 5

## 2016-08-09 MED ORDER — PALONOSETRON HCL INJECTION 0.25 MG/5ML
0.2500 mg | Freq: Once | INTRAVENOUS | Status: AC
  Administered 2016-08-09: 0.25 mg via INTRAVENOUS

## 2016-08-09 MED ORDER — DEXAMETHASONE SODIUM PHOSPHATE 10 MG/ML IJ SOLN
INTRAMUSCULAR | Status: AC
  Filled 2016-08-09: qty 1

## 2016-08-09 MED ORDER — PALONOSETRON HCL INJECTION 0.25 MG/5ML
INTRAVENOUS | Status: AC
  Filled 2016-08-09: qty 5

## 2016-08-09 MED ORDER — SODIUM CHLORIDE 0.9 % IV SOLN
300.0000 mg | Freq: Once | INTRAVENOUS | Status: AC
  Administered 2016-08-09: 300 mg via INTRAVENOUS
  Filled 2016-08-09: qty 12

## 2016-08-09 MED ORDER — DEXAMETHASONE SODIUM PHOSPHATE 10 MG/ML IJ SOLN
10.0000 mg | Freq: Once | INTRAMUSCULAR | Status: AC
  Administered 2016-08-09: 10 mg via INTRAVENOUS

## 2016-08-09 MED ORDER — SODIUM CHLORIDE 0.9% FLUSH
10.0000 mL | INTRAVENOUS | Status: DC | PRN
  Administered 2016-08-09: 10 mL
  Filled 2016-08-09: qty 10

## 2016-08-09 MED ORDER — IRINOTECAN HCL CHEMO INJECTION 100 MG/5ML
140.0000 mg/m2 | Freq: Once | INTRAVENOUS | Status: AC
  Administered 2016-08-09: 240 mg via INTRAVENOUS
  Filled 2016-08-09: qty 5

## 2016-08-09 MED ORDER — ATROPINE SULFATE 1 MG/ML IJ SOLN
0.5000 mg | Freq: Once | INTRAMUSCULAR | Status: AC | PRN
  Administered 2016-08-09: 0.5 mg via INTRAVENOUS

## 2016-08-09 MED ORDER — SODIUM CHLORIDE 0.9 % IV SOLN
Freq: Once | INTRAVENOUS | Status: AC
  Administered 2016-08-09: 12:00:00 via INTRAVENOUS

## 2016-08-09 MED ORDER — ATROPINE SULFATE 1 MG/ML IJ SOLN
INTRAMUSCULAR | Status: AC
  Filled 2016-08-09: qty 1

## 2016-08-09 MED ORDER — SODIUM CHLORIDE 0.9 % IV SOLN
Freq: Once | INTRAVENOUS | Status: AC
  Administered 2016-08-09: 11:00:00 via INTRAVENOUS

## 2016-08-09 NOTE — Assessment & Plan Note (Addendum)
83 year old female with stage IV adenocarcinoma of the colon, MSI-low, KRAS-mutant. Recently progressed on treatment with 5-FU and bevacizumab and was started on the next line of treatment with Irinotecan and bevacizumab 3 weeks ago. At last visit to the clinic, patient was found to have persistent neutropenia with ANC of 0.4 and the second cycle of chemotherapy was delayed.  Since last visit to the clinic, patient reports no febrile illness, no symptoms to suggest an active infection. Also noticed significant improvement in the diarrhea which was previously concurrently affecting her quality of life. Clinical evaluation and lab work today permissive to proceed with the treatment as scheduled. Noted elevation of bilirubin has resolved. Stool was tested negative for Clostridium difficile.  Plan: --Proceed with the cycle #2 of irinotecan + bevacizumab systemic palliative therapy --Patient has prescription for Lomotil issued back in January 2018. This would be a very good medication to take for diarrhea that was likely induced by Irinotecan effect. --RTC in 2 week -- labs, clinic visit and possible C3D1 chemotherapy  --Restaging imaging and discussion of the primary oncologist.  Voice recognition software was used and creation of this note. Despite my best effort at editing the text, some misspelling/errors may have occurred.

## 2016-08-09 NOTE — Progress Notes (Signed)
[facility] Follow-up Visit:  Assessment: Cancer of right colon [facility]) 83 year old female with stage IV adenocarcinoma of the colon, MSI-low, KRAS-mutant. Recently progressed on treatment with 5-FU and bevacizumab and was started on the next line of treatment with Irinotecan and bevacizumab 3 weeks ago. At last visit to the clinic, patient was found to have persistent neutropenia with ANC of 0.4 and the second cycle of chemotherapy was delayed.  Since last visit to the clinic, patient reports no febrile illness, no symptoms to suggest an active infection. Also noticed significant improvement in the diarrhea which was previously concurrently affecting her quality of life. Clinical evaluation and lab work today permissive to proceed with the treatment as scheduled. Noted elevation of bilirubin has resolved. Stool was tested negative for Clostridium difficile.  Plan: --Proceed with the cycle #2 of irinotecan + bevacizumab systemic palliative therapy --Patient has prescription for Lomotil issued back in January 2018. This would be a very good medication to take for diarrhea that was likely induced by Irinotecan effect. --RTC in 2 week -- labs, clinic visit and possible C3D1 chemotherapy  --Restaging imaging and discussion of the primary oncologist.  Voice recognition software was used and creation of this note. Despite my best effort at editing the text, some misspelling/errors may have occurred.    Orders Placed This Encounter  Procedures  . CBC with Differential    Standing Status:   Future    Standing Expiration Date:   08/09/2017  . Comprehensive metabolic panel    Standing Status:   Future    Standing Expiration Date:   08/09/2017  . Magnesium    Standing Status:   Future    Standing Expiration Date:   08/09/2017    Cancer Staging Cancer of left colon [facility]) Staging form: Colon and Rectum, AJCC 7th Edition - Pathologic stage from 05/17/2014: Stage IIA (T3, N0, cM0) -  Signed by Truitt Merle, MD on 02/21/2015  Cancer of right colon [facility]) Staging form: Colon and Rectum, AJCC 7th Edition - Pathologic stage from 12/08/2014: Stage IIIB (T4a, N1a, cM0) - Signed by Truitt Merle, MD on 02/21/2015   All questions were answered. . The patient knows to call the clinic with any problems, questions or concerns.  This note was electronically signed.    History of Presenting Illness Heidi Marquez 83 y.o. presenting [facility] for [facility] of metastatic with peritoneal carcinomatosis with progression of the disease while receiving 5-FU/bevacizumab therapy. At the last visit to the clinic, patient was transitioned to treatment with irinotecan and bevacizumab and received the therapy on 07/19/16.  Patient returns to clinic for possible cycle 2 of systemic palliative therapy. Since last visit, patient has had intermittent nausea which was well relieved by her current nausea medications. She also had small amounts of diarrhea with 5-6 small bowel movements per day with occasional blood on the toilet tissue, but never in the toilet bowl. Denies significant lightheadedness, dizziness, or weakness. No chest pain, shortness of breath, or cough. Denies or hematuria.   Oncological/hematological History: Oncology History   Cancer of left colon [facility])   Staging form: Colon and Rectum, AJCC 7th Edition     Pathologic stage from 05/17/2014: Stage IIA (T3, N0, cM0) - Signed by Truitt Merle, MD on 02/21/2015 Cancer of right colon [facility])   Staging form: Colon and Rectum, AJCC 7th Edition     Pathologic stage from 12/08/2014: Stage IIIB (T4a, N1a, cM0) - Signed by Truitt Merle, MD on 02/21/2015  Cancer of right colon ([place])   04/09/2014 Procedure    Colonoscopy per Dr. Silvano Rusk: Large fleshy mass sigmoid colon 25 cm from anus. Could not pass through.      04/09/2014 Tumor Marker    CEA =11.7      04/12/2014 Imaging    CT C/A/P:sigmoid colon mass;several small areas of soft  tissue nodularity in peritoneal cavity; small attenuation structure posterior right hepatic lobe; 2 stable nodules in lungs dating back to 2014. No acute findings.      05/17/2014 Initial Diagnosis    Colon cancer-presented with heme + stool and more frequent BMs      05/17/2014 Surgery    Robot assisted sigmoidectomy      05/17/2014 Pathologic Stage    pT3,pN0,pMX--Grade 1--0/13 nodes +--MMR normal      05/17/2014 Clinical Stage    Stage II      12/08/2014 Surgery    right hemicolectomy       12/08/2014 Pathology Results    right colon adenocarcinoma, pT4aN1c, 19 nodes were negative, LVI(+), MMR normal       01/17/2015 - 06/27/2015 Chemotherapy    adjuvant capecitabine 1532m q12h, 2 weeks on and 1 week off. dose reduction and stopped after 6 cycles due to poor tolerance,       08/15/2015 Progression    CT abdomen showed peritoneal nodules, highly suspicious for recurrent metastasis      10/10/2015 Imaging    PET scan showed multiple enlarging, hypermetabolic peritoneal nodules consistent with peritoneal carcinomatosis. No other evidence of metastasis      11/04/2015 Pathology Results    Diagnosis 11/04/2015 Peritoneum, biopsy, RLQ - METASTATIC COLORECTAL ADENOCARCINOMA. - SEE COMMENT. -[facility]      02/02/2016 Imaging    CT CAP W CONTRAST 02/02/2016 IMPRESSION: 1. Extensive worsening of peritoneal spread of tumor with numerous large new tumor nodules scattered throughout The upper omentum and also scattered within along the mesentery. Mild extension into the right retroperitoneum at the level of the iliac crest. 2. Slight reduction in size of the right upper lobe pulmonary nodule 1.1 cm, previously 1.2 cm. Stable indistinct ground-glass density nodule in the right upper lobe. 3. Airway thickening is present, suggesting bronchitis or reactive airways disease. 4. Ascending aortic aneurysm 4.0 cm in diameter. Recommend annual imaging followup by CTA or MRA. This  recommendation follows 2010 ACCF/AHA/AATS/ACR/ASA/SCA/SCAI/SIR/STS/SVM Guidelines for the Diagnosis and Management of Patients with Thoracic Aortic Disease. Circulation. 2010; 121: [id number] 5. Cholelithiasis.      02/08/2016 -  Chemotherapy    5-fu infusion and Avastin every 2 weeks        04/17/2016 Imaging    CT CAP  IMPRESSION: 1. Slight interval improvement in extensive peritoneal carcinomatosis compared with prior CT from 02/02/2016. No definite disease progression. 2. No evidence of thoracic or hepatic metastases. 3. No definite bowel wall invasion, ascites or bowel obstruction. 4. Cholelithiasis. 5. Stable mild pulmonary nodularity, likely benign.      07/17/2016 Imaging    CT CAP w contrast IMPRESSION: 1. Today's study demonstrates progressive peritoneal carcinomatosis, as detailed above. Mildly enlarged hepatoduodenal ligament lymph node also noted. 2. No other metastatic disease noted elsewhere in the chest (previously noted right upper lobe pulmonary nodule remains stable over several prior examinations), abdomen or pelvis. 3. Aortic atherosclerosis, with ectasia of ascending thoracic aorta (4.3 cm in diameter). Recommend annual imaging followup by CTA or MRA. This recommendation follows 2010       07/19/2016 -  Chemotherapy  Irinotecan + bevacizumab Q14d --Cycle #1, 07/19/16 --Cycle #2, ...: delayed due to neutropenia (ANC 0.4 on 08/02/16)        Medical History: Past Medical History:  Diagnosis Date  . Adenocarcinoma of colon [facility]) oncologist-  dr Truitt Merle---  last chemo 01-17-2015 to 06-27-2015)   dx 05-17-2014 sigmoid (left) cancer, Stage 2, G1 (pT3N0M0), MSI stable -- s/p  sigmoidectomy /  dx 12-08-2014 ascending colon cancer (right) , Stage 3B, G2, (pT4aN1cM0), MSI stable -- s/p  right hemicolectomy/  08-2015  recurrent peritoneal carcinomatosis  . Arthritis    knees  . Coronary atherosclerosis   . Diverticulosis of colon   . GERD  (gastroesophageal reflux disease)   . HH (hiatus hernia)   . History of basal cell carcinoma excision    right side nose 2013  s/p  moh's  . History of epistaxis    2005  caurterized twice  . History of esophageal dilatation    2009 for stricture  . History of esophagitis    reflux  . Internal hemorrhoids   . Macular degeneration of both eyes   . Multinodular thyroid    remote hx benign bx 2005  . Peritoneal carcinomatosis ([place])    secondary to primary colon cancer  . Pulmonary nodule, right    noted since 2014-- stable per last ct 06-15-2015  . Thoracic ascending aortic aneurysm [facility])    followed by dr Cyndia Bent ([facility])  last ct 06-15-2015  4.3cm    Surgical History: Past Surgical History:  Procedure Laterality Date  . COLONOSCOPY WITH PROPOFOL N/A 09/30/2014   Procedure: COLONOSCOPY WITH PROPOFOL;  Surgeon: Milus Banister, MD;  Location: [facility];  Service: Endoscopy;  Laterality: N/A;  . ESOPHAGOGASTRODUODENOSCOPY  2009  . INGUINAL HERNIA REPAIR Right early 1990s  . MOHS SURGERY  11/2011   nose  . PORTACATH PLACEMENT Right 01/24/2016   Procedure: INSERTION PORT-A-CATH;  Surgeon: Leighton Ruff, MD;  Location: [facility];  Service: General;  Laterality: Right;  . ROBOTIC ASSISTED RIGHT HEMICOLECTOMY  01/60/1093    dr Leighton Ruff  . ROBOTIC ASSISTED SIGMOIDECTOMY  23/55/7322   dr Leighton Ruff  . UPPER GASTROINTESTINAL ENDOSCOPY    . VAGINAL HYSTERECTOMY  age 75   partial with the bladder tack    Family History: Family History  Problem Relation Age of Onset  . Colon cancer Paternal Grandfather 21       colon cancer   . Stroke Father   . Heart attack Son 12       died of heart attack   . Esophageal cancer Neg Hx   . Rectal cancer Neg Hx   . Stomach cancer Neg Hx     Social History: Social History   Social History  . Marital status: Married    Spouse name: N/A  . Number of children: N/A  . Years of education: N/A   Occupational History  .  Not on file.   Social History Main Topics  . Smoking status: Never Smoker  . Smokeless tobacco: Never Used  . Alcohol use No  . Drug use: No  . Sexual activity: Not on file   Other Topics Concern  . Not on file   Social History Narrative   Married, husband is disabled (physical and dementia) and she is caregiver   Has #1 child (daughter) living. Son died of MI at age 65   Worked at [employer] before she retired   Has #3 cats  Allergies: Allergies  Allergen Reactions  . Sulfa Antibiotics Swelling    Eyes,mouth  . Amoxicillin-Pot Clavulanate Rash  . Penicillins Swelling and Rash    Medications:  Current Outpatient Prescriptions  Medication Sig Dispense Refill  . alendronate (FOSAMAX) 70 MG tablet Take 70 mg by mouth every 7 (seven) days. Sundays. Take with a full glass of water on an empty stomach.    Marland Kitchen amLODipine (NORVASC) 5 MG tablet Take 1 tablet (5 mg total) by mouth daily. 30 tablet 2  . Ascorbic Acid (VITAMIN C) 1000 MG tablet Take 1,000 mg by mouth daily.    . Biotin 1000 MCG tablet Take 1,000 mcg by mouth daily.     Marland Kitchen CALCIUM & MAGNESIUM CARBONATES PO Take 1 tablet by mouth daily.    . cholecalciferol (VITAMIN D) 1000 UNITS tablet Take 1,000 Units by mouth daily.    . diphenoxylate-atropine (LOMOTIL) 2.5-0.025 MG tablet Take 1-2 tablets by mouth 4 (four) times daily as needed for diarrhea or loose stools. 30 tablet 2  . glucosamine-chondroitin 500-400 MG tablet Take 1 tablet by mouth daily.     . Homeopathic Products ([employer] LEG CRAMPS PAIN RELIEF PO) Take 1 tablet by mouth daily as needed (for leg cramps).    Marland Kitchen ibuprofen (ADVIL,MOTRIN) 200 MG tablet Take 200 mg by mouth daily as needed for moderate pain.     Marland Kitchen lidocaine-prilocaine (EMLA) cream Apply to affected area once 30 g 3  . loratadine (CLARITIN) 10 MG tablet Take 10 mg by mouth daily.    . Multiple Vitamin (MULTIVITAMIN) tablet Take 1 tablet by mouth daily.    . ondansetron (ZOFRAN) 8 MG tablet Take 1  tablet (8 mg total) by mouth every 8 (eight) hours as needed (Nausea or vomiting). 30 tablet 1  . potassium chloride SA (K-DUR,KLOR-CON) 20 MEQ tablet Take 1 tablet (20 mEq total) by mouth daily. (Patient taking differently: Take 20 mEq by mouth every other day. ) 20 tablet 1  . prochlorperazine (COMPAZINE) 10 MG tablet Take 1 tablet (10 mg total) by mouth every 8 (eight) hours as needed for nausea (Nausea or vomiting). 30 tablet 0  . ranitidine (ZANTAC) 150 MG tablet Take 150 mg by mouth as needed for heartburn.    . traMADol (ULTRAM) 50 MG tablet Take 1 tablet (50 mg total) by mouth every 6 (six) hours as needed. 30 tablet 1  . urea (CARMOL) 10 % cream Apply topically 2 (two) times daily. Apply to hand twice daily 142 g 0  . vitamin B-12 (CYANOCOBALAMIN) 1000 MCG tablet Take 1,000 mcg by mouth daily.      No current facility-administered medications for this visit.    Facility-Administered Medications Ordered in Other Visits  Medication Dose Route Frequency Provider Last Rate Last Dose  . sodium chloride flush (NS) 0.9 % injection 10 mL  10 mL Intracatheter PRN Truitt Merle, MD   10 mL at 07/19/16 1515    Review of Systems: Review of Systems  All other systems reviewed and are negative.    PHYSICAL EXAMINATION Blood pressure 120/76, pulse 68, temperature 97.1 F (36.2 C), temperature source Oral, resp. rate 18, height _0  (1.6 m), weight 134 lb 6.4 oz (61 kg), SpO2 98 %.  ECOG PERFORMANCE STATUS: 1 - Symptomatic but completely ambulatory  Physical Exam  Constitutional: She is oriented to person, place, and time. No distress.  Elderly frail female.  HENT:  Mouth/Throat: Oropharynx is clear and moist. No oropharyngeal exudate.  No oral sores  Eyes: EOM are normal. Pupils are equal, round, and reactive to light. Right eye exhibits no discharge. Left eye exhibits no discharge. No scleral icterus.  Cardiovascular: Normal rate and regular rhythm.   Murmur heard. Faint early systolic  murmur best heard over the aortic projection  Pulmonary/Chest: Breath sounds normal. No respiratory distress. She has no wheezes.  Abdominal: Soft. She exhibits no distension. There is tenderness.  Mild tenderness in the right lower quadrant without rebound. Remainder of the abdomen without significant tenderness.  Musculoskeletal: She exhibits no edema.  Lymphadenopathy:    She has no cervical adenopathy.  Neurological: She is alert and oriented to person, place, and time.  Skin: Skin is warm and dry. No rash noted. She is not diaphoretic. No erythema. There is pallor.     LABORATORY DATA: I have personally reviewed the data as listed: Appointment on 08/09/2016  Component Date Value Ref Range Status  . WBC 08/09/2016 6.5  3.9 - 10.3 10e3/uL Final  . NEUT# 08/09/2016 4.9  1.5 - 6.5 10e3/uL Final  . HGB 08/09/2016 11.7  11.6 - 15.9 g/dL Final  . HCT 08/09/2016 34.4* 34.8 - 46.6 % Final  . Platelets 08/09/2016 253  145 - 400 10e3/uL Final  . MCV 08/09/2016 96.2  79.5 - 101.0 fL Final  . MCH 08/09/2016 32.8  25.1 - 34.0 pg Final  . MCHC 08/09/2016 34.1  31.5 - 36.0 g/dL Final  . RBC 08/09/2016 3.57* 3.70 - 5.45 10e6/uL Final  . RDW 08/09/2016 13.9  11.2 - 14.5 % Final  . lymph# 08/09/2016 0.7* 0.9 - 3.3 10e3/uL Final  . MONO# 08/09/2016 0.5  0.1 - 0.9 10e3/uL Final  . Eosinophils Absolute 08/09/2016 0.3  0.0 - 0.5 10e3/uL Final  . Basophils Absolute 08/09/2016 0.1  0.0 - 0.1 10e3/uL Final  . NEUT% 08/09/2016 75.4  38.4 - 76.8 % Final  . LYMPH% 08/09/2016 10.9* 14.0 - 49.7 % Final  . MONO% 08/09/2016 8.5  0.0 - 14.0 % Final  . EOS% 08/09/2016 3.9  0.0 - 7.0 % Final  . BASO% 08/09/2016 1.3  0.0 - 2.0 % Final  . Sodium 08/09/2016 143  136 - 145 mEq/L Final  . Potassium 08/09/2016 4.3  3.5 - 5.1 mEq/L Final  . Chloride 08/09/2016 108  98 - 109 mEq/L Final  . CO2 08/09/2016 27  22 - 29 mEq/L Final  . Glucose 08/09/2016 100  70 - 140 mg/dl Final   Glucose reference range is for  nonfasting patients. Fasting glucose reference range is 70- 100.  Marland Kitchen BUN 08/09/2016 14.9  7.0 - 26.0 mg/dL Final  . Creatinine 08/09/2016 0.9  0.6 - 1.1 mg/dL Final  . Total Bilirubin 08/09/2016 0.40  0.20 - 1.20 mg/dL Final  . Alkaline Phosphatase 08/09/2016 100  40 - 150 U/L Final  . AST 08/09/2016 19  5 - 34 U/L Final  . ALT 08/09/2016 11  0 - 55 U/L Final  . Total Protein 08/09/2016 6.3* 6.4 - 8.3 g/dL Final  . Albumin 08/09/2016 3.3* 3.5 - 5.0 g/dL Final  . Calcium 08/09/2016 9.3  8.4 - 10.4 mg/dL Final  . Anion Gap 08/09/2016 8  3 - 11 mEq/L Final  . EGFR 08/09/2016 64* >90 ml/min/1.73 m2 Final   eGFR is calculated using the CKD-EPI Creatinine Equation (2009)       Ardath Sax, MD

## 2016-08-09 NOTE — Patient Instructions (Addendum)
[facility] Discharge Instructions for Patients Receiving Chemotherapy  Today you received the following chemotherapy agents:  Irinotecan, Avastin  To help prevent nausea and vomiting after your treatment, we encourage you to take your nausea medication as prescribed.   If you develop nausea and vomiting that is not controlled by your nausea medication, call the clinic.   BELOW ARE SYMPTOMS THAT SHOULD BE REPORTED IMMEDIATELY:  *FEVER GREATER THAN 100.5 F  *CHILLS WITH OR WITHOUT FEVER  NAUSEA AND VOMITING THAT IS NOT CONTROLLED WITH YOUR NAUSEA MEDICATION  *UNUSUAL SHORTNESS OF BREATH  *UNUSUAL BRUISING OR BLEEDING  TENDERNESS IN MOUTH AND THROAT WITH OR WITHOUT PRESENCE OF ULCERS  *URINARY PROBLEMS  *BOWEL PROBLEMS  UNUSUAL RASH Items with * indicate a potential emergency and should be followed up as soon as possible.  Feel free to call the clinic you have any questions or concerns. The clinic phone number is [phone].  Please show the [facility] at check-in to the Emergency Department and triage nurse.    Irinotecan injection What is this medicine? IRINOTECAN (ir in oh TEE kan ) is a chemotherapy drug. It is used to treat colon and rectal cancer. This medicine may be used for other purposes; ask your health care provider or pharmacist if you have questions. COMMON BRAND NAME(S): Camptosar What should I tell my health care provider before I take this medicine? They need to know if you have any of these conditions: -blood disorders -dehydration -diarrhea -infection (especially a virus infection such as chickenpox, cold sores, or herpes) -liver disease -low blood counts, like low white cell, platelet, or red cell counts -recent or ongoing radiation therapy -an unusual or allergic reaction to irinotecan, sorbitol, other chemotherapy, other medicines, foods, dyes, or preservatives -pregnant or trying to get pregnant -breast-feeding How  should I use this medicine? This drug is given as an infusion into a vein. It is administered in a hospital or clinic by a specially trained health care professional. Talk to your pediatrician regarding the use of this medicine in children. Special care may be needed. Overdosage: If you think you have taken too much of this medicine contact a poison control center or emergency room at once. NOTE: This medicine is only for you. Do not share this medicine with others. What if I miss a dose? It is important not to miss your dose. Call your doctor or health care professional if you are unable to keep an appointment. What may interact with this medicine? Do not take this medicine with any of the following medications: -atazanavir -certain medicines for fungal infections like itraconazole and ketoconazole -St. John's Wort This medicine may also interact with the following medications: -dexamethasone -diuretics -laxatives -medicines for seizures like carbamazepine, mephobarbital, phenobarbital, phenytoin, primidone -medicines to increase blood counts like filgrastim, pegfilgrastim, sargramostim -prochlorperazine -vaccines This list may not describe all possible interactions. Give your health care provider a list of all the medicines, herbs, non-prescription drugs, or dietary supplements you use. Also tell them if you smoke, drink alcohol, or use illegal drugs. Some items may interact with your medicine. What should I watch for while using this medicine? Your condition will be monitored carefully while you are receiving this medicine. You will need important blood work done while you are taking this medicine. This drug may make you feel generally unwell. This is not uncommon, as chemotherapy can affect healthy cells as well as cancer cells. Report any side effects. Continue your course of treatment even  though you feel ill unless your doctor tells you to stop. In some cases, you may be given  additional medicines to help with side effects. Follow all directions for their use. You may get drowsy or dizzy. Do not drive, use machinery, or do anything that needs mental alertness until you know how this medicine affects you. Do not stand or sit up quickly, especially if you are an older patient. This reduces the risk of dizzy or fainting spells. Call your doctor or health care professional for advice if you get a fever, chills or sore throat, or other symptoms of a cold or flu. Do not treat yourself. This drug decreases your body's ability to fight infections. Try to avoid being around people who are sick. This medicine may increase your risk to bruise or bleed. Call your doctor or health care professional if you notice any unusual bleeding. Be careful brushing and flossing your teeth or using a toothpick because you may get an infection or bleed more easily. If you have any dental work done, tell your dentist you are receiving this medicine. Avoid taking products that contain aspirin, acetaminophen, ibuprofen, naproxen, or ketoprofen unless instructed by your doctor. These medicines may hide a fever. Do not become pregnant while taking this medicine. Women should inform their doctor if they wish to become pregnant or think they might be pregnant. There is a potential for serious side effects to an unborn child. Talk to your health care professional or pharmacist for more information. Do not breast-feed an infant while taking this medicine. What side effects may I notice from receiving this medicine? Side effects that you should report to your doctor or health care professional as soon as possible: -allergic reactions like skin rash, itching or hives, swelling of the face, lips, or tongue -low blood counts - this medicine may decrease the number of white blood cells, red blood cells and platelets. You may be at increased risk for infections and bleeding. -signs of infection - fever or chills,  cough, sore throat, pain or difficulty passing urine -signs of decreased platelets or bleeding - bruising, pinpoint red spots on the skin, black, tarry stools, blood in the urine -signs of decreased red blood cells - unusually weak or tired, fainting spells, lightheadedness -breathing problems -chest pain -diarrhea -feeling faint or lightheaded, falls -flushing, runny nose, sweating during infusion -mouth sores or pain -pain, swelling, redness or irritation where injected -pain, swelling, warmth in the leg -pain, tingling, numbness in the hands or feet -problems with balance, talking, walking -stomach cramps, pain -trouble passing urine or change in the amount of urine -vomiting as to be unable to hold down drinks or food -yellowing of the eyes or skin Side effects that usually do not require medical attention (report to your doctor or health care professional if they continue or are bothersome): -constipation -hair loss -headache -loss of appetite -nausea, vomiting -stomach upset This list may not describe all possible side effects. Call your doctor for medical advice about side effects. You may report side effects to FDA at 1-800-FDA-1088. Where should I keep my medicine? This drug is given in a hospital or clinic and will not be stored at home. NOTE: This sheet is a summary. It may not cover all possible information. If you have questions about this medicine, talk to your doctor, pharmacist, or health care provider.  2018 Elsevier/Gold Standard (2012-07-28 16:29:32)   Bevacizumab injection What is this medicine? BEVACIZUMAB (be va SIZ yoo mab) is a monoclonal  antibody. It is used to treat many types of cancer. This medicine may be used for other purposes; ask your health care provider or pharmacist if you have questions. COMMON BRAND NAME(S): Avastin What should I tell my health care provider before I take this medicine? They need to know if you have any of these  conditions: -diabetes -heart disease -high blood pressure -history of coughing up blood -prior anthracycline chemotherapy (e.g., doxorubicin, daunorubicin, epirubicin) -recent or ongoing radiation therapy -recent or planning to have surgery -stroke -an unusual or allergic reaction to bevacizumab, hamster proteins, mouse proteins, other medicines, foods, dyes, or preservatives -pregnant or trying to get pregnant -breast-feeding How should I use this medicine? This medicine is for infusion into a vein. It is given by a health care professional in a hospital or clinic setting. Talk to your pediatrician regarding the use of this medicine in children. Special care may be needed. Overdosage: If you think you have taken too much of this medicine contact a poison control center or emergency room at once. NOTE: This medicine is only for you. Do not share this medicine with others. What if I miss a dose? It is important not to miss your dose. Call your doctor or health care professional if you are unable to keep an appointment. What may interact with this medicine? Interactions are not expected. This list may not describe all possible interactions. Give your health care provider a list of all the medicines, herbs, non-prescription drugs, or dietary supplements you use. Also tell them if you smoke, drink alcohol, or use illegal drugs. Some items may interact with your medicine. What should I watch for while using this medicine? Your condition will be monitored carefully while you are receiving this medicine. You will need important blood work and urine testing done while you are taking this medicine. This medicine may increase your risk to bruise or bleed. Call your doctor or health care professional if you notice any unusual bleeding. This medicine should be started at least 28 days following major surgery and the site of the surgery should be totally healed. Check with your doctor before scheduling  dental work or surgery while you are receiving this treatment. Talk to your doctor if you have recently had surgery or if you have a wound that has not healed. Do not become pregnant while taking this medicine or for 6 months after stopping it. Women should inform their doctor if they wish to become pregnant or think they might be pregnant. There is a potential for serious side effects to an unborn child. Talk to your health care professional or pharmacist for more information. Do not breast-feed an infant while taking this medicine and for 6 months after the last dose. This medicine has caused ovarian failure in some women. This medicine may interfere with the ability to have a child. You should talk to your doctor or health care professional if you are concerned about your fertility. What side effects may I notice from receiving this medicine? Side effects that you should report to your doctor or health care professional as soon as possible: -allergic reactions like skin rash, itching or hives, swelling of the face, lips, or tongue -chest pain or chest tightness -chills -coughing up blood -high fever -seizures -severe constipation -signs and symptoms of bleeding such as bloody or black, tarry stools; red or dark-brown urine; spitting up blood or brown material that looks like coffee grounds; red spots on the skin; unusual bruising or bleeding from the  eye, gums, or nose -signs and symptoms of a blood clot such as breathing problems; chest pain; severe, sudden headache; pain, swelling, warmth in the leg -signs and symptoms of a stroke like changes in vision; confusion; trouble speaking or understanding; severe headaches; sudden numbness or weakness of the face, arm or leg; trouble walking; dizziness; loss of balance or coordination -stomach pain -sweating -swelling of legs or ankles -vomiting -weight gain Side effects that usually do not require medical attention (report to your doctor or health  care professional if they continue or are bothersome): -back pain -changes in taste -decreased appetite -dry skin -nausea -tiredness This list may not describe all possible side effects. Call your doctor for medical advice about side effects. You may report side effects to FDA at 1-800-FDA-1088. Where should I keep my medicine? This drug is given in a hospital or clinic and will not be stored at home. NOTE: This sheet is a summary. It may not cover all possible information. If you have questions about this medicine, talk to your doctor, pharmacist, or health care provider.  2018 Elsevier/Gold Standard (2016-01-27 14:33:29)

## 2016-08-09 NOTE — Telephone Encounter (Signed)
Gave patient avs report and appointments for July. Patient seen by MP today while YF out. Patient will have today and 2 weeks from now with lab/YF. Patient aware if she is able to have tx when she sees YF 7/12 we will cx 7/20 appointments.

## 2016-08-16 ENCOUNTER — Telehealth: Payer: Self-pay | Admitting: *Deleted

## 2016-08-16 NOTE — Telephone Encounter (Signed)
Received vm call from pt stating that she is confused about appts.  She is scheduled for 08/23/16 & again for 08/31/16.  She wants to confirm appts.  She also reports severe diarrhea that started yesterday & has continued.  Returned call this pm & pt states she took 2 lomotil after 3 BM's during night & this am & is doing better now.  She reports feeling weak & her BP is low.  Encouraged to drink plenty of fluids this pm & informed that she could be dehydrated after the diarrhea.  Informed to call tomorrow if diarrhea continues & BP low.  May need IVF. Informed to keep 08/23/16 appts & next treatment due 7/26 not 7/20.  Will send LOS to change.

## 2016-08-17 ENCOUNTER — Other Ambulatory Visit: Payer: Self-pay

## 2016-08-17 ENCOUNTER — Ambulatory Visit: Payer: Self-pay | Admitting: Hematology

## 2016-08-17 ENCOUNTER — Ambulatory Visit: Payer: Self-pay

## 2016-08-21 NOTE — Progress Notes (Signed)
- [place]  [phone] Fax:([phone]  Clinic Follow Up Note   Patient Care Team: Prince Solian, MD as PCP - General (Internal Medicine) Gaye Pollack, MD as Consulting Physician (Cardiothoracic Surgery) Gatha Mayer, MD as Consulting Physician (Gastroenterology) Leighton Ruff, MD as Consulting Physician (General Surgery) Tania Ade, RN as Registered Nurse (Medical Oncology) 08/23/2016   CHIEF COMPLAINTS:  Follow-up of colon cancer  Oncology History   Cancer of left colon [facility])   Staging form: Colon and Rectum, AJCC 7th Edition     Pathologic stage from 05/17/2014: Stage IIA (T3, N0, cM0) - Signed by Truitt Merle, MD on 02/21/2015 Cancer of right colon [facility])   Staging form: Colon and Rectum, AJCC 7th Edition     Pathologic stage from 12/08/2014: Stage IIIB (T4a, N1a, cM0) - Signed by Truitt Merle, MD on 02/21/2015       Cancer of right colon ([place])   04/09/2014 Procedure    Colonoscopy per Dr. Silvano Rusk: Large fleshy mass sigmoid colon 25 cm from anus. Could not pass through.      04/09/2014 Tumor Marker    CEA =11.7      04/12/2014 Imaging    CT C/A/P:sigmoid colon mass;several small areas of soft tissue nodularity in peritoneal cavity; small attenuation structure posterior right hepatic lobe; 2 stable nodules in lungs dating back to 2014. No acute findings.      05/17/2014 Initial Diagnosis    Colon cancer-presented with heme + stool and more frequent BMs      05/17/2014 Surgery    Robot assisted sigmoidectomy      05/17/2014 Pathologic Stage    pT3,pN0,pMX--Grade 1--0/13 nodes +--MMR normal      05/17/2014 Clinical Stage    Stage II      12/08/2014 Surgery    right hemicolectomy       12/08/2014 Pathology Results    right colon adenocarcinoma, pT4aN1c, 19 nodes were negative, LVI(+), MMR normal       01/17/2015 - 06/27/2015 Chemotherapy    adjuvant capecitabine 1547m q12h, 2 weeks on and 1 week off. dose reduction and  stopped after 6 cycles due to poor tolerance,       08/15/2015 Progression    CT abdomen showed peritoneal nodules, highly suspicious for recurrent metastasis      10/10/2015 Imaging    PET scan showed multiple enlarging, hypermetabolic peritoneal nodules consistent with peritoneal carcinomatosis. No other evidence of metastasis      11/04/2015 Pathology Results    Diagnosis 11/04/2015 Peritoneum, biopsy, RLQ - METASTATIC COLORECTAL ADENOCARCINOMA. - SEE COMMENT. -[facility]      02/02/2016 Imaging    CT CAP W CONTRAST 02/02/2016 IMPRESSION: 1. Extensive worsening of peritoneal spread of tumor with numerous large new tumor nodules scattered throughout The upper omentum and also scattered within along the mesentery. Mild extension into the right retroperitoneum at the level of the iliac crest. 2. Slight reduction in size of the right upper lobe pulmonary nodule 1.1 cm, previously 1.2 cm. Stable indistinct ground-glass density nodule in the right upper lobe. 3. Airway thickening is present, suggesting bronchitis or reactive airways disease. 4. Ascending aortic aneurysm 4.0 cm in diameter. Recommend annual imaging followup by CTA or MRA. This recommendation follows 2010 ACCF/AHA/AATS/ACR/ASA/SCA/SCAI/SIR/STS/SVM Guidelines for the Diagnosis and Management of Patients with Thoracic Aortic Disease. Circulation. 2010; 121: e266-e369 5. Cholelithiasis.      02/08/2016 -  Chemotherapy    5-fu infusion and Avastin every 2 weeks  04/17/2016 Imaging    CT CAP  IMPRESSION: 1. Slight interval improvement in extensive peritoneal carcinomatosis compared with prior CT from 02/02/2016. No definite disease progression. 2. No evidence of thoracic or hepatic metastases. 3. No definite bowel wall invasion, ascites or bowel obstruction. 4. Cholelithiasis. 5. Stable mild pulmonary nodularity, likely benign.      07/17/2016 Imaging    CT CAP w contrast IMPRESSION: 1. Today's study  demonstrates progressive peritoneal carcinomatosis, as detailed above. Mildly enlarged hepatoduodenal ligament lymph node also noted. 2. No other metastatic disease noted elsewhere in the chest (previously noted right upper lobe pulmonary nodule remains stable over several prior examinations), abdomen or pelvis. 3. Aortic atherosclerosis, with ectasia of ascending thoracic aorta (4.3 cm in diameter). Recommend annual imaging followup by CTA or MRA. This recommendation follows 2010       07/19/2016 -  Chemotherapy    Irinotecan + bevacizumab Q14d --Cycle #1, 28/78/67: Complicated by protracted neutropenia, grade 2 diarrhea, grade 1 nausea --Cycle #2, 08/09/16: delayed due to neutropenia (ANC 0.4 on 08/02/16)        HISTORY OF PRESENTING ILLNESS:  Heidi Marquez 83 y.o. female is here because of recently diagnosed a stage II sigmoid colon cancer.  She had annual physical with her PCP in early Feb this year, and stool OB (+), no overt GI bleeding, CBC was normal. She had noticed some loose BM before that, but no nausea, loss of appetite, weight loss or other symoptoms. Colonoscopy showed a large mass in the sigmoid colon 25 cm from anus. Biopsy was taken which showed tubulovillous adenoma, no malignancy. She was referred to surgeon Dr. Marcello Moores and underwent robotic-assisted sigmoidectomy on 4/4. She tolerated the procedure well, and was discharged home afterwards.  She has recovered well from the surgery 3 weeks ago, she has mild fatigue, but otherwise doing very well without other complains. She had 2-3 episodes of bleeding after the surgery, which was related to her herromods. She is eating well. 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   She has chronic knee pain she takes ibuprofen once daily.    CURRENT THERAPY: second line chemotherapy Irinotecan and Avastin every 2 weeks, started on 07/19/2016  INTERIM HISTORY:  Mrs. Escutia returns  for follow-up and 3rd cycle chemotherapy accompanied by her friend. Since her last treatment she has been experienced some diarrhea, fatigue, nausea, loss of appetite which started a couple of days after chemo. Her taste did not return until yesterday. She has been taking Imodium and Lomotil for her diarrhea which helped some. Her nausea has resolved now although she still has some diarrhea. Numbness in fingers and toes not as bad anymore.  MEDICAL HISTORY:  Past Medical History:  Diagnosis Date  . Adenocarcinoma of colon [facility]) oncologist-  dr Truitt Merle---  last chemo 01-17-2015 to 06-27-2015)   dx 05-17-2014 sigmoid (left) cancer, Stage 2, G1 (pT3N0M0), MSI stable -- s/p  sigmoidectomy /  dx 12-08-2014 ascending colon cancer (right) , Stage 3B, G2, (pT4aN1cM0), MSI stable -- s/p  right hemicolectomy/  08-2015  recurrent peritoneal carcinomatosis  . Arthritis    knees  . Coronary atherosclerosis   . Diverticulosis of colon   . GERD (gastroesophageal reflux disease)   . HH (hiatus hernia)   . History of basal cell carcinoma excision    right side nose 2013  s/p  moh's  . History of epistaxis    2005  caurterized twice  . History  of esophageal dilatation    2009 for stricture  . History of esophagitis    reflux  . Internal hemorrhoids   . Macular degeneration of both eyes   . Multinodular thyroid    remote hx benign bx 2005  . Peritoneal carcinomatosis ([place])    secondary to primary colon cancer  . Pulmonary nodule, right    noted since 2014-- stable per last ct 06-15-2015  . Thoracic ascending aortic aneurysm [facility])    followed by dr Cyndia Bent ([facility])  last ct 06-15-2015  4.3cm    SURGICAL HISTORY: Past Surgical History:  Procedure Laterality Date  . COLONOSCOPY WITH PROPOFOL N/A 09/30/2014   Procedure: COLONOSCOPY WITH PROPOFOL;  Surgeon: Milus Banister, MD;  Location: [facility];  Service: Endoscopy;  Laterality: N/A;  . ESOPHAGOGASTRODUODENOSCOPY  2009  . INGUINAL HERNIA REPAIR  Right early 1990s  . MOHS SURGERY  11/2011   nose  . PORTACATH PLACEMENT Right 01/24/2016   Procedure: INSERTION PORT-A-CATH;  Surgeon: Leighton Ruff, MD;  Location: [facility];  Service: General;  Laterality: Right;  . ROBOTIC ASSISTED RIGHT HEMICOLECTOMY  44/96/7591    dr Leighton Ruff  . ROBOTIC ASSISTED SIGMOIDECTOMY  63/84/6659   dr Leighton Ruff  . UPPER GASTROINTESTINAL ENDOSCOPY    . VAGINAL HYSTERECTOMY  age 10   partial with the bladder tack    SOCIAL HISTORY: Social History   Social History  . Marital status: Married    Spouse name: N/A  . Number of children: N/A  . Years of education: N/A   Occupational History  . Not on file.   Social History Main Topics  . Smoking status: Never Smoker  . Smokeless tobacco: Never Used  . Alcohol use No  . Drug use: No  . Sexual activity: Not on file   Other Topics Concern  . Not on file   Social History Narrative   Married, husband is disabled (physical and dementia) and she is caregiver   Has #1 child (daughter) living. Son died of MI at age 45   Worked at [employer] before she retired   Has #3 cats    FAMILY HISTORY: Family History  Problem Relation Age of Onset  . Colon cancer Paternal Grandfather 74       colon cancer   . Stroke Father   . Heart attack Son 66       died of heart attack   . Esophageal cancer Neg Hx   . Rectal cancer Neg Hx   . Stomach cancer Neg Hx     ALLERGIES:  is allergic to sulfa antibiotics; amoxicillin-pot clavulanate; and penicillins.  MEDICATIONS:  Current Outpatient Prescriptions  Medication Sig Dispense Refill  . alendronate (FOSAMAX) 70 MG tablet Take 70 mg by mouth every 7 (seven) days. Sundays. Take with a full glass of water on an empty stomach.    Marland Kitchen amLODipine (NORVASC) 5 MG tablet Take 1 tablet (5 mg total) by mouth daily. 30 tablet 2  . Ascorbic Acid (VITAMIN C) 1000 MG tablet Take 1,000 mg by mouth daily.    . Biotin 1000 MCG tablet Take 1,000 mcg  by mouth daily.     Marland Kitchen CALCIUM & MAGNESIUM CARBONATES PO Take 1 tablet by mouth daily.    . cholecalciferol (VITAMIN D) 1000 UNITS tablet Take 1,000 Units by mouth daily.    . diphenoxylate-atropine (LOMOTIL) 2.5-0.025 MG tablet Take 1-2 tablets by mouth 4 (four) times daily as needed for diarrhea or loose stools. [place]  tablet 2  . glucosamine-chondroitin 500-400 MG tablet Take 1 tablet by mouth daily.     . Homeopathic Products ([employer] LEG CRAMPS PAIN RELIEF PO) Take 1 tablet by mouth daily as needed (for leg cramps).    Marland Kitchen ibuprofen (ADVIL,MOTRIN) 200 MG tablet Take 200 mg by mouth daily as needed for moderate pain.     Marland Kitchen lidocaine-prilocaine (EMLA) cream Apply to affected area once 30 g 3  . loratadine (CLARITIN) 10 MG tablet Take 10 mg by mouth daily.    . Multiple Vitamin (MULTIVITAMIN) tablet Take 1 tablet by mouth daily.    . ondansetron (ZOFRAN) 8 MG tablet Take 1 tablet (8 mg total) by mouth every 8 (eight) hours as needed (Nausea or vomiting). 30 tablet 1  . potassium chloride SA (K-DUR,KLOR-CON) 20 MEQ tablet Take 1 tablet (20 mEq total) by mouth daily. (Patient taking differently: Take 20 mEq by mouth every other day. ) 20 tablet 1  . prochlorperazine (COMPAZINE) 10 MG tablet Take 1 tablet (10 mg total) by mouth every 8 (eight) hours as needed for nausea (Nausea or vomiting). 30 tablet 0  . ranitidine (ZANTAC) 150 MG tablet Take 150 mg by mouth as needed for heartburn.    . traMADol (ULTRAM) 50 MG tablet Take 1 tablet (50 mg total) by mouth every 6 (six) hours as needed. 30 tablet 1  . urea (CARMOL) 10 % cream Apply topically 2 (two) times daily. Apply to hand twice daily 142 g 0  . vitamin B-12 (CYANOCOBALAMIN) 1000 MCG tablet Take 1,000 mcg by mouth daily.      No current facility-administered medications for this visit.    Facility-Administered Medications Ordered in Other Visits  Medication Dose Route Frequency Provider Last Rate Last Dose  . sodium chloride flush (NS) 0.9 % injection  10 mL  10 mL Intracatheter PRN Truitt Merle, MD   10 mL at 07/19/16 1515    REVIEW OF SYSTEMS:  Constitutional: Denies fevers, chills or abnormal night sweats (+) lost 3 pounds Eyes: Denies blurriness of vision, double vision or watery eyes Ears, nose, mouth, throat, and face: Denies mucositis or sore throat Respiratory: Denies cough, dyspnea or wheezes Cardiovascular: Denies palpitation, chest discomfort or lower extremity swelling Gastrointestinal:  Denies nausea, heartburn or change in bowel habits (+) frequent bowel movement Skin: Denies abnormal skin rashes Lymphatics: Denies new lymphadenopathy or easy bruising Neurological:Denies numbness, or new weaknesses  Behavioral/Psych: Mood is stable, no new changes  All other systems were reviewed with the patient and are negative.   PHYSICAL EXAMINATION: ECOG PERFORMANCE STATUS: 1  Vitals:   08/23/16 1221  BP: 137/67  Pulse: 77  Resp: 18  Temp: (!) 97.5 F (36.4 C)   Filed Weights   08/23/16 1221  Weight: 131 lb 6.4 oz (59.6 kg)     GENERAL:alert, no distress and comfortable SKIN: skin color, texture, turgor are normal  or significant lesions  (+)rash on upper back EYES: normal, conjunctiva are pink and non-injected, sclera clear OROPHARYNX:no exudate, no erythema and lips, buccal mucosa, and tongue normal  NECK: supple, thyroid normal size, non-tender, without nodularity LYMPH:  no palpable lymphadenopathy in the cervical, axillary or inguinal LUNGS: clear to auscultation and percussion with normal breathing effort HEART: regular rate & rhythm and no murmurs and no lower extremity edema ABDOMEN:abdomen soft, surgical incision and to the low abdomen is well healed. non-tender and normal bowel sounds, rectal exam showed internal and external hemorrhoids, no bleeding, stool was green. Musculoskeletal:no cyanosis of digits  and no clubbing  PSYCH: alert & oriented x 3 with fluent speech NEURO: no focal motor/sensory deficits    LABORATORY DATA:  I have reviewed the data as listed CBC Latest Ref Rng & Units 08/23/2016 08/09/2016 08/02/2016  WBC 3.9 - 10.3 10e3/uL 3.3(L) 6.5 1.7(L)  Hemoglobin 11.6 - 15.9 g/dL 11.0(L) 11.7 11.6  Hematocrit 34.8 - 46.6 % 32.4(L) 34.4(L) 34.3(L)  Platelets 145 - 400 10e3/uL 180 253 189   CMP Latest Ref Rng & Units 08/23/2016 08/09/2016 08/02/2016  Glucose 70 - 140 mg/dl 115 100 104  BUN 7.0 - 26.0 mg/dL 17.1 14.9 16.6  Creatinine 0.6 - 1.1 mg/dL 0.8 0.9 0.9  Sodium 136 - 145 mEq/L 144 143 142  Potassium 3.5 - 5.1 mEq/L 3.2(L) 4.3 3.6  Chloride 101 - 111 mmol/L - - -  CO2 22 - 29 mEq/L '24 27 25  ' Calcium 8.4 - 10.4 mg/dL 8.7 9.3 8.9  Total Protein 6.4 - 8.3 g/dL 6.2(L) 6.3(L) 6.4  Total Bilirubin 0.20 - 1.20 mg/dL 0.66 0.40 1.21(H)  Alkaline Phos 40 - 150 U/L 84 100 123  AST 5 - 34 U/L '15 19 19  ' ALT 0 - 55 U/L '9 11 12   ' CEA 05/02/2015: 2.6 08/15/2015: 9.6 12/16/2015: 253 03/08/2016: 463 04/05/16: 443.6 05/03/16: 347.82 05/31/16: 311.27 07/05/2016: 313.82 08/02/2016: 314.33  Pathology report:  Diagnosis 11/04/2015 Peritoneum, biopsy, RLQ - METASTATIC COLORECTAL ADENOCARCINOMA. - SEE COMMENT.  Diagnosis 05/17/2014 1. Colon, segmental resection for tumor, sigmoid, open end proximal - INVASIVE ADENOCARCINOMA WITH EXTRACELLULAR MUCIN, INVADING THROUGH THE MUSCULARIS PROPRIA INTO PERICOLONIC FATTY TISSUE. - THIRTEEN LYMPH NODES, NEGATIVE FOR METASTATIC CARCINOMA (0/13). - MULTIPLE DIVERTICULA. - RESECTION MARGINS, NEGATIVE FOR ATYPIA OR MALIGNANCY. - PLEASE SEE ONCOLOGY TEMPLATE FOR DETAILS. 2. Colon, resection margin (donut), sigmoid - BENIGN COLONIC MUCOSA, NO EVIDENCE OF DYSPLASIA OR MALIGNANCY.  Diagnosis 12/08/2014  Colon, segmental resection for tumor, right colon MODERATELY DIFFERENTIATED ADENOCARCINOMA OF THE COLON (3.8 CM) THE TUMOR PENETRATES TO THE SURFACE OF THE VISCERAL PERITONEUM (T4A) LYMPHOVASCULAR INVASION IS IDENTIFIED MARGINS OF RESECTION ARE NEGATIVE FOR  TUMOR NINTEEN BENIGN LYMPH NODE (0/19) ONE TUMOR DEPOSITE IS IDENTIFIED ([place])   RADIOGRAPHIC STUDIES: I have personally reviewed the radiological images as listed and agreed with the findings in the report.  CT CAP w contrast 07/17/2016 IMPRESSION: 1. Today's study demonstrates progressive peritoneal carcinomatosis, as detailed above. Mildly enlarged hepatoduodenal ligament lymph node also noted. 2. No other metastatic disease noted elsewhere in the chest (previously noted right upper lobe pulmonary nodule remains stable over several prior examinations), abdomen or pelvis. 3. Aortic atherosclerosis, with ectasia of ascending thoracic aorta (4.3 cm in diameter). Recommend annual imaging followup by CTA or MRA. This recommendation follows 2010  CT CAP 04/17/16 IMPRESSION: 1. Slight interval improvement in extensive peritoneal carcinomatosis compared with prior CT from 02/02/2016. No definite disease progression. 2. No evidence of thoracic or hepatic metastases. 3. No definite bowel wall invasion, ascites or bowel obstruction. 4. Cholelithiasis. 5. Stable mild pulmonary nodularity, likely benign.  CT CAP 02/02/2016 IMPRESSION: 1. Extensive worsening of peritoneal spread of tumor with numerous large new tumor nodules scattered throughout The upper omentum and also scattered within along the mesentery. Mild extension into the right retroperitoneum at the level of the iliac crest. 2. Slight reduction in size of the right upper lobe pulmonary nodule 1.1 cm, previously 1.2 cm. Stable indistinct ground-glass density nodule in the right upper lobe. 3. Airway thickening is present, suggesting bronchitis or reactive airways disease. 4. Ascending aortic  aneurysm 4.0 cm in diameter. Recommend annual imaging followup by CTA or MRA. This recommendation follows 2010 ACCF/AHA/AATS/ACR/ASA/SCA/SCAI/SIR/STS/SVM Guidelines for the Diagnosis and Management of Patients with Thoracic Aortic  Disease. Circulation. 2010; 121: e266-e369 5. Cholelithiasis.   PET 10/10/2015 IMPRESSION: 1. Multiple enlarging, hypermetabolic peritoneal nodules consistent with peritoneal carcinomatosis. No generalized ascites. 2. No evidence of metastatic disease to the liver. 3. No suspicious pulmonary findings. Grossly stable chronic postinflammatory changes. 4. Multinodular goiter.  Diagnosis 11/04/2015 Peritoneum, biopsy, RLQ - METASTATIC COLORECTAL ADENOCARCINOMA. - SEE COMMENT.  ASSESSMENT & PLAN:  83 y.o. Caucasian female, with past medical history of colon diverticulosis and arthritis, but otherwise healthy and active, who was found to have a large sigmoid tumor, status post sigmoidectomy.  1. Sigmoid colon adenocarcinoma, pT3N0M0, stage II, G1, MSI stable, ascending colon adenocarcinoma, pT4aN1cM0, stage IIIB, MSI stable, G2, recurrent peritoneal carcinomatosis in 08/2015 -She had multifocal (2) colon cancer status post complete surgical resection,  -I previously reviewed her recent surgical pathology results in great details with her. The right colon cancer has several high-risk features, including stage IIIB, T4a lesion, tumor deposits, lymphovascular invasion, her risk of colon cancer recurrence is high.  -She tried adjuvant chemotherapy Xeloda, but could not tolerate full dose, and last cycle chemotherapy was canceled due to her tolerance issue. -I previously reviewed her recent PET scan from 10/10/2015, which unfortunately showed multiple enlarging, hypermetabolic peritoneal nodules consistent with peritoneal carcinomatosis. No ascites or other metastasis. -I previously discussed her peritoneal mass biopsy, which confirmed metastatic colon cancer  -We previously discussed that her cancer is not curable at this stage, she is not a candidate for debulking and HIPEC, and her goal of care is palliation.  -Her [facility] genomic testing results were discussed with the patient. She has  (+) KRAS mutation, not a candidate for EGFR inhibitor   -Her tumor has MSI stable, not a good candidate for immunotherapy alone  -She has started first-line palliative chemotherapy with 5-FU infusion and Avastin, tolerate treatment better w/o 5-fu bolus -We previously reviewed her staging CT scan from 04/17/2016, which showed partial response of her peritoneal metastasis, no other new lesions. -I reviewed her restaging CT scan image from 07/17/2016 with patient and her friend in person. She unfortunately has had disease progression in the peritoneal metastasis. -We discussed the option of switching her to second line chemotherapy, versus palliative care alone patient would like to try more chemotherapy.   -she is on second line irinotecan and Avastin  -due to her worsening diarrhea, I will hold chemo today, and proceed with Avastin only  -Next cycle I will reduce her Irinotecan dosage.  - She can take multivitamins with silver. I encouraged her to take 2 potassium pills daily for 2 days then take one daily  2. Peripheral neuropathy, G 1 -From previous chemotherapy Xeloda, mild and stable now.  3. GERD -she'll continue Protonix.  4.Lung nodules  -She has two small right lung nodules which were discovered on the CT scan in 09/2012,  RUL nodule slightly bigger on recent CT -She has been follow-up with Dr. Cyndia Bent.  -she never smoked, low risk for lung cancer -given the stability of the RUL nodule over 2 years, this is likely a benign lesion   5. Abdominal Pain -She has previously developed some abdominal pain lately, likely related to diarrhea and gas.  -She will take tylenol for the pain -Previously prescribed Tramadol. Take PRN up to 3 times a day.   6. HTN -Slightly elevated previously. No  history of HTN, possible related to Avastin  -She will start monitoring and writing down her BP readings at home.  -I previously started her on amlodipine for her hypertension, she ran out a few days  ago, I refilled for today. Her blood pressure is elevated today.  7. Hair Loss - Patient's hair is starting to thin, secondary to chemo  -OK to use biotin  8. Support/social issue -Her husband's dementia has been progressing and he has become aggressive and dangerous. -I have previously encouraged her to speak to our social worker.  -We previously discussed speaking to his doctor to get medication to help control his aggressive behavior.  -Her daughter would like to put him in a dementia facility.  9.Goal of care discussion  -We again discussed the incurable nature of her cancer, and the overall poor prognosis, especially if she does not have good response to chemotherapy or progress on chemo -The patient understands the goal of care is palliative. -I recommend DNR/DNI, she will think about it   Plan - I will hold Irinotecan today due to diarrhea and continue with Avastin  - next treatment 7/27, will reduce irinotecan dose    All questions were answered. The patient knows to call the clinic with any problems, questions or concerns.  I spent 20 minutes counseling the patient face to face. The total time spent in the appointment was 25  minutes and more than 50% was on counseling.  This document serves as a record of services personally performed by Truitt Merle, MD. It was created on her behalf by Brandt Loosen, a trained medical scribe. The creation of this record is based on the scribe's personal observations and the provider's statements to them. This document has been checked and approved by the attending provider.   I have reviewed the above documentation for accuracy and completeness, and I agree with the above information.      Truitt Merle  08/23/2016

## 2016-08-23 ENCOUNTER — Ambulatory Visit (HOSPITAL_BASED_OUTPATIENT_CLINIC_OR_DEPARTMENT_OTHER): Payer: PPO | Admitting: Hematology

## 2016-08-23 ENCOUNTER — Ambulatory Visit: Payer: PPO

## 2016-08-23 ENCOUNTER — Other Ambulatory Visit (HOSPITAL_BASED_OUTPATIENT_CLINIC_OR_DEPARTMENT_OTHER): Payer: PPO

## 2016-08-23 ENCOUNTER — Ambulatory Visit (HOSPITAL_BASED_OUTPATIENT_CLINIC_OR_DEPARTMENT_OTHER): Payer: PPO

## 2016-08-23 VITALS — BP 137/67 | HR 77 | Temp 97.5°F | Resp 18 | Ht 63.0 in | Wt 131.4 lb

## 2016-08-23 VITALS — BP 129/65 | HR 70 | Resp 18

## 2016-08-23 DIAGNOSIS — C187 Malignant neoplasm of sigmoid colon: Secondary | ICD-10-CM

## 2016-08-23 DIAGNOSIS — C182 Malignant neoplasm of ascending colon: Secondary | ICD-10-CM

## 2016-08-23 DIAGNOSIS — R197 Diarrhea, unspecified: Secondary | ICD-10-CM | POA: Diagnosis not present

## 2016-08-23 DIAGNOSIS — L658 Other specified nonscarring hair loss: Secondary | ICD-10-CM | POA: Diagnosis not present

## 2016-08-23 DIAGNOSIS — K219 Gastro-esophageal reflux disease without esophagitis: Secondary | ICD-10-CM

## 2016-08-23 DIAGNOSIS — Z5111 Encounter for antineoplastic chemotherapy: Secondary | ICD-10-CM

## 2016-08-23 DIAGNOSIS — R109 Unspecified abdominal pain: Secondary | ICD-10-CM | POA: Diagnosis not present

## 2016-08-23 DIAGNOSIS — C786 Secondary malignant neoplasm of retroperitoneum and peritoneum: Secondary | ICD-10-CM

## 2016-08-23 DIAGNOSIS — Z95828 Presence of other vascular implants and grafts: Secondary | ICD-10-CM

## 2016-08-23 DIAGNOSIS — Z5112 Encounter for antineoplastic immunotherapy: Secondary | ICD-10-CM

## 2016-08-23 DIAGNOSIS — C186 Malignant neoplasm of descending colon: Secondary | ICD-10-CM

## 2016-08-23 DIAGNOSIS — G620 Drug-induced polyneuropathy: Secondary | ICD-10-CM

## 2016-08-23 LAB — COMPREHENSIVE METABOLIC PANEL
ALBUMIN: 3.2 g/dL — AB (ref 3.5–5.0)
ALK PHOS: 84 U/L (ref 40–150)
ALT: 9 U/L (ref 0–55)
ANION GAP: 8 meq/L (ref 3–11)
AST: 15 U/L (ref 5–34)
BILIRUBIN TOTAL: 0.66 mg/dL (ref 0.20–1.20)
BUN: 17.1 mg/dL (ref 7.0–26.0)
CALCIUM: 8.7 mg/dL (ref 8.4–10.4)
CO2: 24 mEq/L (ref 22–29)
CREATININE: 0.8 mg/dL (ref 0.6–1.1)
Chloride: 112 mEq/L — ABNORMAL HIGH (ref 98–109)
EGFR: 66 mL/min/{1.73_m2} — ABNORMAL LOW (ref >90)
Glucose: 115 mg/dl (ref 70–140)
Potassium: 3.2 mEq/L — ABNORMAL LOW (ref 3.5–5.1)
Sodium: 144 mEq/L (ref 136–145)
Total Protein: 6.2 g/dL — ABNORMAL LOW (ref 6.4–8.3)

## 2016-08-23 LAB — CBC WITH DIFFERENTIAL/PLATELET
BASO%: 1.3 % (ref 0.0–2.0)
BASOS ABS: 0 10*3/uL (ref 0.0–0.1)
EOS%: 11.2 % — AB (ref 0.0–7.0)
Eosinophils Absolute: 0.4 10*3/uL (ref 0.0–0.5)
HEMATOCRIT: 32.4 % — AB (ref 34.8–46.6)
HEMOGLOBIN: 11 g/dL — AB (ref 11.6–15.9)
LYMPH#: 0.6 10*3/uL — AB (ref 0.9–3.3)
LYMPH%: 17.4 % (ref 14.0–49.7)
MCH: 32 pg (ref 25.1–34.0)
MCHC: 33.8 g/dL (ref 31.5–36.0)
MCV: 94.5 fL (ref 79.5–101.0)
MONO#: 0.5 10*3/uL (ref 0.1–0.9)
MONO%: 14.7 % — ABNORMAL HIGH (ref 0.0–14.0)
NEUT#: 1.8 10*3/uL (ref 1.5–6.5)
NEUT%: 55.4 % (ref 38.4–76.8)
PLATELETS: 180 10*3/uL (ref 145–400)
RBC: 3.43 10*6/uL — ABNORMAL LOW (ref 3.70–5.45)
RDW: 14 % (ref 11.2–14.5)
WBC: 3.3 10*3/uL — ABNORMAL LOW (ref 3.9–10.3)

## 2016-08-23 LAB — MAGNESIUM: MAGNESIUM: 2.1 mg/dL (ref 1.5–2.5)

## 2016-08-23 MED ORDER — HEPARIN SOD (PORK) LOCK FLUSH 100 UNIT/ML IV SOLN
500.0000 [IU] | Freq: Once | INTRAVENOUS | Status: AC | PRN
  Administered 2016-08-23: 500 [IU]
  Filled 2016-08-23: qty 5

## 2016-08-23 MED ORDER — SODIUM CHLORIDE 0.9% FLUSH
10.0000 mL | INTRAVENOUS | Status: DC | PRN
  Administered 2016-08-23: 10 mL via INTRAVENOUS
  Filled 2016-08-23: qty 10

## 2016-08-23 MED ORDER — SODIUM CHLORIDE 0.9 % IV SOLN
300.0000 mg | Freq: Once | INTRAVENOUS | Status: AC
  Administered 2016-08-23: 300 mg via INTRAVENOUS
  Filled 2016-08-23: qty 12

## 2016-08-23 MED ORDER — SODIUM CHLORIDE 0.9 % IV SOLN
Freq: Once | INTRAVENOUS | Status: AC
  Administered 2016-08-23: 13:00:00 via INTRAVENOUS

## 2016-08-23 MED ORDER — SODIUM CHLORIDE 0.9% FLUSH
10.0000 mL | INTRAVENOUS | Status: DC | PRN
  Administered 2016-08-23: 10 mL
  Filled 2016-08-23: qty 10

## 2016-08-23 NOTE — Patient Instructions (Signed)
[facility] Discharge Instructions for Patients Receiving Chemotherapy  Today you received the following chemotherapy agents Avastin To help prevent nausea and vomiting after your treatment, we encourage you to take your nausea medication as prescribed.   If you develop nausea and vomiting that is not controlled by your nausea medication, call the clinic.   BELOW ARE SYMPTOMS THAT SHOULD BE REPORTED IMMEDIATELY:  *FEVER GREATER THAN 100.5 F  *CHILLS WITH OR WITHOUT FEVER  NAUSEA AND VOMITING THAT IS NOT CONTROLLED WITH YOUR NAUSEA MEDICATION  *UNUSUAL SHORTNESS OF BREATH  *UNUSUAL BRUISING OR BLEEDING  TENDERNESS IN MOUTH AND THROAT WITH OR WITHOUT PRESENCE OF ULCERS  *URINARY PROBLEMS  *BOWEL PROBLEMS  UNUSUAL RASH Items with * indicate a potential emergency and should be followed up as soon as possible.  Feel free to call the clinic you have any questions or concerns. The clinic phone number is [phone].  Please show the CHEMO ALERT CARD at check-in to the Emergency Department and triage nurse.   

## 2016-08-26 ENCOUNTER — Encounter: Payer: Self-pay | Admitting: Hematology

## 2016-08-31 ENCOUNTER — Other Ambulatory Visit: Payer: Self-pay

## 2016-08-31 ENCOUNTER — Ambulatory Visit: Payer: Self-pay

## 2016-08-31 ENCOUNTER — Ambulatory Visit: Payer: Self-pay | Admitting: Hematology

## 2016-09-06 NOTE — Progress Notes (Signed)
- Heidi Marquez  Telephone:([phone]) [phone] [phone]) [phone]  Clinic Follow Up Note   Patient Care Team: Prince Solian, MD as PCP - General (Internal Medicine) Gaye Pollack, MD as Consulting Physician (Cardiothoracic Surgery) Gatha Mayer, MD as Consulting Physician (Gastroenterology) Leighton Ruff, MD as Consulting Physician (General Surgery) Tania Ade, RN as Registered Nurse (Medical Oncology) 09/07/2016   CHIEF COMPLAINTS:  Follow-up of colon cancer  Oncology History   Cancer of left colon [facility])   Staging form: Colon and Rectum, AJCC 7th Edition     Pathologic stage from 05/17/2014: Stage IIA (T3, N0, cM0) - Signed by Heidi Merle, MD on 02/21/2015 Cancer of right colon [facility])   Staging form: Colon and Rectum, AJCC 7th Edition     Pathologic stage from 12/08/2014: Stage IIIB (T4a, N1a, cM0) - Signed by Heidi Merle, MD on 02/21/2015       Cancer of right colon ([place])   04/09/2014 Procedure    Colonoscopy per Dr. Silvano Marquez: Large fleshy mass sigmoid colon 25 cm from anus. Could not pass through.      04/09/2014 Tumor Marker    CEA =11.7      04/12/2014 Imaging    CT C/A/P:sigmoid colon mass;several small areas of soft tissue nodularity in peritoneal cavity; small attenuation structure posterior right hepatic lobe; 2 stable nodules in lungs dating back to 2014. No acute findings.      05/17/2014 Initial Diagnosis    Colon cancer-presented with heme + stool and more frequent BMs      05/17/2014 Surgery    Robot assisted sigmoidectomy      05/17/2014 Pathologic Stage    pT3,pN0,pMX--Grade 1--0/13 nodes +--MMR normal      05/17/2014 Clinical Stage    Stage II      12/08/2014 Surgery    right hemicolectomy       12/08/2014 Pathology Results    right colon adenocarcinoma, pT4aN1c, 19 nodes were negative, LVI(+), MMR normal       01/17/2015 - 06/27/2015 Chemotherapy    adjuvant capecitabine 1525m q12h, 2 weeks on and 1 week off. dose reduction and  stopped after 6 cycles due to poor tolerance,       08/15/2015 Progression    CT abdomen showed peritoneal nodules, highly suspicious for recurrent metastasis      10/10/2015 Imaging    PET scan showed multiple enlarging, hypermetabolic peritoneal nodules consistent with peritoneal carcinomatosis. No other evidence of metastasis      11/04/2015 Pathology Results    Diagnosis 11/04/2015 Peritoneum, biopsy, RLQ - METASTATIC COLORECTAL ADENOCARCINOMA. - SEE COMMENT. -[facility]      02/02/2016 Imaging    CT CAP W CONTRAST 02/02/2016 IMPRESSION: 1. Extensive worsening of peritoneal spread of tumor with numerous large new tumor nodules scattered throughout The upper omentum and also scattered within along the mesentery. Mild extension into the right retroperitoneum at the level of the iliac crest. 2. Slight reduction in size of the right upper lobe pulmonary nodule 1.1 cm, previously 1.2 cm. Stable indistinct ground-glass density nodule in the right upper lobe. 3. Airway thickening is present, suggesting bronchitis or reactive airways disease. 4. Ascending aortic aneurysm 4.0 cm in diameter. Recommend annual imaging followup by CTA or MRA. This recommendation follows 2010 ACCF/AHA/AATS/ACR/ASA/SCA/SCAI/SIR/STS/SVM Guidelines for the Diagnosis and Management of Patients with Thoracic Aortic Disease. Circulation. 2010; 121: e266-e369 5. Cholelithiasis.      02/08/2016 -  Chemotherapy    5-fu infusion and Avastin every 2 weeks  04/17/2016 Imaging    CT CAP  IMPRESSION: 1. Slight interval improvement in extensive peritoneal carcinomatosis compared with prior CT from 02/02/2016. No definite disease progression. 2. No evidence of thoracic or hepatic metastases. 3. No definite bowel wall invasion, ascites or bowel obstruction. 4. Cholelithiasis. 5. Stable mild pulmonary nodularity, likely benign.      07/17/2016 Imaging    CT CAP w contrast IMPRESSION: 1. Today's study  demonstrates progressive peritoneal carcinomatosis, as detailed above. Mildly enlarged hepatoduodenal ligament lymph node also noted. 2. No other metastatic disease noted elsewhere in the chest (previously noted right upper lobe pulmonary nodule remains stable over several prior examinations), abdomen or pelvis. 3. Aortic atherosclerosis, with ectasia of ascending thoracic aorta (4.3 cm in diameter). Recommend annual imaging followup by CTA or MRA. This recommendation follows 2010       07/19/2016 -  Chemotherapy    Irinotecan + bevacizumab Q14d --Cycle #1, 16/10/96: Complicated by protracted neutropenia, grade 2 diarrhea, grade 1 nausea --Cycle #2, 08/09/16: delayed due to neutropenia (ANC 0.4 on 08/02/16)        HISTORY OF PRESENTING ILLNESS:  Heidi Marquez 83 y.o. female is here because of recently diagnosed a stage II sigmoid colon cancer.  She had annual physical with her PCP in early Feb this year, and stool OB (+), no overt GI bleeding, CBC was normal. She had noticed some loose BM before that, but no nausea, loss of appetite, weight loss or other symoptoms. Colonoscopy showed a large mass in the sigmoid colon 25 cm from anus. Biopsy was taken which showed tubulovillous adenoma, no malignancy. She was referred to surgeon Dr. Marcello Marquez and underwent robotic-assisted sigmoidectomy on 4/4. She tolerated the procedure well, and was discharged home afterwards.  She has recovered well from the surgery 3 weeks ago, she has mild fatigue, but otherwise doing very well without other complains. She had 2-3 episodes of bleeding after the surgery, which was related to her herromods. She is eating well. 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   She has chronic knee pain she takes ibuprofen once daily.    CURRENT THERAPY: second line chemotherapy Irinotecan 130m/m2 and Avastin every 2 weeks, started on 07/19/2016, irinotecan dose reduced to 120  November square meter. Cycle 3  INTERIM HISTORY:  [name] for follow-up and 3rd cycle chemotherapy accompanied by her friend. Chemotherapy irinotecan was held 2 weeks ago due, and she feels much better with the chemotherapy break. She denies any significant pain, no diarrhea, her appetite and energy level has been very good lately. No other new complaints.  MEDICAL HISTORY:  Past Medical History:  Diagnosis Date  . Adenocarcinoma of colon ([facility] oncologist-  dr yTruitt Marquez--  last chemo 01-17-2015 to 06-27-2015)   dx 05-17-2014 sigmoid (left) cancer, Stage 2, G1 (pT3N0M0), MSI stable -- s/p  sigmoidectomy /  dx 12-08-2014 ascending colon cancer (right) , Stage 3B, G2, (pT4aN1cM0), MSI stable -- s/p  right hemicolectomy/  08-2015  recurrent peritoneal carcinomatosis  . Arthritis    knees  . Coronary atherosclerosis   . Diverticulosis of colon   . GERD (gastroesophageal reflux disease)   . HH (hiatus hernia)   . History of basal cell carcinoma excision    right side nose 2013  s/p  moh's  . History of epistaxis    2005  caurterized twice  . History of esophageal dilatation    2009 for stricture  . History of esophagitis  reflux  . Internal hemorrhoids   . Macular degeneration of both eyes   . Multinodular thyroid    remote hx benign bx 2005  . Peritoneal carcinomatosis ([address])    secondary to primary colon cancer  . Pulmonary nodule, right    noted since 2014-- stable per last ct 06-15-2015  . Thoracic ascending aortic aneurysm [facility] )    followed by dr Cyndia Bent ([facility])  last ct 06-15-2015  4.3cm    SURGICAL HISTORY: Past Surgical History:  Procedure Laterality Date  . COLONOSCOPY WITH PROPOFOL N/A 09/30/2014   Procedure: COLONOSCOPY WITH PROPOFOL;  Surgeon: Milus Banister, MD;  Location: [facility];  Service: Endoscopy;  Laterality: N/A;  . ESOPHAGOGASTRODUODENOSCOPY  2009  . INGUINAL HERNIA REPAIR Right early 1990s  . MOHS SURGERY  11/2011   nose  . PORTACATH  PLACEMENT Right 01/24/2016   Procedure: INSERTION PORT-A-CATH;  Surgeon: Leighton Ruff, MD;  Location: [facility];  Service: General;  Laterality: Right;  . ROBOTIC ASSISTED RIGHT HEMICOLECTOMY  10/62/6948    dr Leighton Ruff  . ROBOTIC ASSISTED SIGMOIDECTOMY  54/62/7035   dr Leighton Ruff  . UPPER GASTROINTESTINAL ENDOSCOPY    . VAGINAL HYSTERECTOMY  age 70   partial with the bladder tack    SOCIAL HISTORY: Social History   Social History  . Marital status: Married    Spouse name: N/A  . Number of children: N/A  . Years of education: N/A   Occupational History  . Not on file.   Social History Main Topics  . Smoking status: Never Smoker  . Smokeless tobacco: Never Used  . Alcohol use No  . Drug use: No  . Sexual activity: Not on file   Other Topics Concern  . Not on file   Social History Narrative   Married, husband is disabled (physical and dementia) and she is caregiver   Has #1 child (daughter) living. Son died of MI at age 26   Worked at [employer] before she retired   Has #3 cats    FAMILY HISTORY: Family History  Problem Relation Age of Onset  . Colon cancer Paternal Grandfather 69       colon cancer   . Stroke Father   . Heart attack Son 73       died of heart attack   . Esophageal cancer Neg Hx   . Rectal cancer Neg Hx   . Stomach cancer Neg Hx     ALLERGIES:  is allergic to sulfa antibiotics; amoxicillin-pot clavulanate; and penicillins.  MEDICATIONS:  Current Outpatient Prescriptions  Medication Sig Dispense Refill  . alendronate (FOSAMAX) 70 MG tablet Take 70 mg by mouth every 7 (seven) days. Sundays. Take with a full glass of water on an empty stomach.    Marland Kitchen amLODipine (NORVASC) 5 MG tablet Take 1 tablet (5 mg total) by mouth daily. 30 tablet 2  . Ascorbic Acid (VITAMIN C) 1000 MG tablet Take 1,000 mg by mouth daily.    . Biotin 1000 MCG tablet Take 1,000 mcg by mouth daily.     Marland Kitchen CALCIUM & MAGNESIUM CARBONATES PO Take 1  tablet by mouth daily.    . cholecalciferol (VITAMIN D) 1000 UNITS tablet Take 1,000 Units by mouth daily.    . diphenoxylate-atropine (LOMOTIL) 2.5-0.025 MG tablet Take 1-2 tablets by mouth 4 (four) times daily as needed for diarrhea or loose stools. 30 tablet 2  . glucosamine-chondroitin 500-400 MG tablet Take 1 tablet by mouth daily.     Marland Kitchen  Homeopathic Products ([employer] LEG CRAMPS PAIN RELIEF PO) Take 1 tablet by mouth daily as needed (for leg cramps).    Marland Kitchen ibuprofen (ADVIL,MOTRIN) 200 MG tablet Take 200 mg by mouth daily as needed for moderate pain.     Marland Kitchen lidocaine-prilocaine (EMLA) cream Apply to affected area once 30 g 3  . loratadine (CLARITIN) 10 MG tablet Take 10 mg by mouth daily.    . Multiple Vitamin (MULTIVITAMIN) tablet Take 1 tablet by mouth daily.    . ondansetron (ZOFRAN) 8 MG tablet Take 1 tablet (8 mg total) by mouth every 8 (eight) hours as needed (Nausea or vomiting). 30 tablet 1  . potassium chloride SA (K-DUR,KLOR-CON) 20 MEQ tablet Take 1 tablet (20 mEq total) by mouth daily. (Patient taking differently: Take 20 mEq by mouth every other day. ) 20 tablet 1  . prochlorperazine (COMPAZINE) 10 MG tablet Take 1 tablet (10 mg total) by mouth every 8 (eight) hours as needed for nausea (Nausea or vomiting). 30 tablet 0  . ranitidine (ZANTAC) 150 MG tablet Take 150 mg by mouth as needed for heartburn.    . traMADol (ULTRAM) 50 MG tablet Take 1 tablet (50 mg total) by mouth every 6 (six) hours as needed. 30 tablet 1  . urea (CARMOL) 10 % cream Apply topically 2 (two) times daily. Apply to hand twice daily 142 g 0  . vitamin B-12 (CYANOCOBALAMIN) 1000 MCG tablet Take 1,000 mcg by mouth daily.      No current facility-administered medications for this visit.    Facility-Administered Medications Ordered in Other Visits  Medication Dose Route Frequency Provider Last Rate Last Dose  . sodium chloride flush (NS) 0.9 % injection 10 mL  10 mL Intracatheter PRN Heidi Merle, MD   10 mL at 07/19/16  1515    REVIEW OF SYSTEMS:  Constitutional: Denies fevers, chills or abnormal night sweats Eyes: Denies blurriness of vision, double vision or watery eyes Ears, nose, mouth, throat, and face: Denies mucositis or sore throat Respiratory: Denies cough, dyspnea or wheezes Cardiovascular: Denies palpitation, chest discomfort or lower extremity swelling Gastrointestinal:  Denies nausea, heartburn or change in bowel habits (+) frequent bowel movement Skin: Denies abnormal skin rashes Lymphatics: Denies new lymphadenopathy or easy bruising Neurological:Denies numbness, or new weaknesses  Behavioral/Psych: Mood is stable, no new changes  All other systems were reviewed with the patient and are negative.   PHYSICAL EXAMINATION: ECOG PERFORMANCE STATUS: 1  Vitals:   09/07/16 1159  BP: (!) 156/78  Pulse: 67  Resp: 18  Temp: 97.7 F (36.5 C)   Filed Weights   09/07/16 1159  Weight: 130 lb 11.2 oz (59.3 kg)     GENERAL:alert, no distress and comfortable SKIN: skin color, texture, turgor are normal  or significant lesions  (+)rash on upper back EYES: normal, conjunctiva are pink and non-injected, sclera clear OROPHARYNX:no exudate, no erythema and lips, buccal mucosa, and tongue normal  NECK: supple, thyroid normal size, non-tender, without nodularity LYMPH:  no palpable lymphadenopathy in the cervical, axillary or inguinal LUNGS: clear to auscultation and percussion with normal breathing effort HEART: regular rate & rhythm and no murmurs and no lower extremity edema ABDOMEN:abdomen soft, surgical incision and to the low abdomen is well healed. non-tender and normal bowel sounds, rectal exam showed internal and external hemorrhoids, no bleeding, stool was green. Musculoskeletal:no cyanosis of digits and no clubbing  PSYCH: alert & oriented x 3 with fluent speech NEURO: no focal motor/sensory deficits   LABORATORY DATA:  I have reviewed the data as listed CBC Latest Ref Rng & Units  09/07/2016 08/23/2016 08/09/2016  WBC 3.9 - 10.3 10e3/uL 5.3 3.3(L) 6.5  Hemoglobin 11.6 - 15.9 g/dL 11.9 11.0(L) 11.7  Hematocrit 34.8 - 46.6 % 36.2 32.4(L) 34.4(L)  Platelets 145 - 400 10e3/uL 165 180 253   CMP Latest Ref Rng & Units 09/07/2016 08/23/2016 08/09/2016  Glucose 70 - 140 mg/dl 124 115 100  BUN 7.0 - 26.0 mg/dL 18.8 17.1 14.9  Creatinine 0.6 - 1.1 mg/dL 0.9 0.8 0.9  Sodium 136 - 145 mEq/L 142 144 143  Potassium 3.5 - 5.1 mEq/L 4.2 3.2(L) 4.3  Chloride 101 - 111 mmol/L - - -  CO2 22 - 29 mEq/L '28 24 27  ' Calcium 8.4 - 10.4 mg/dL 9.4 8.7 9.3  Total Protein 6.4 - 8.3 g/dL 6.5 6.2(L) 6.3(L)  Total Bilirubin 0.20 - 1.20 mg/dL 0.56 0.66 0.40  Alkaline Phos 40 - 150 U/L 91 84 100  AST 5 - 34 U/L '20 15 19  ' ALT 0 - 55 U/L '8 9 11   ' CEA 05/02/2015: 2.6 08/15/2015: 9.6 12/16/2015: 253 03/08/2016: 463 04/05/16: 443.6 05/03/16: 347.82 05/31/16: 311.27 07/05/2016: 313.82 08/02/2016: 314.33  Pathology report:  Diagnosis 11/04/2015 Peritoneum, biopsy, RLQ - METASTATIC COLORECTAL ADENOCARCINOMA. - SEE COMMENT.  Diagnosis 05/17/2014 1. Colon, segmental resection for tumor, sigmoid, open end proximal - INVASIVE ADENOCARCINOMA WITH EXTRACELLULAR MUCIN, INVADING THROUGH THE MUSCULARIS PROPRIA INTO PERICOLONIC FATTY TISSUE. - THIRTEEN LYMPH NODES, NEGATIVE FOR METASTATIC CARCINOMA (0/13). - MULTIPLE DIVERTICULA. - RESECTION MARGINS, NEGATIVE FOR ATYPIA OR MALIGNANCY. - PLEASE SEE ONCOLOGY TEMPLATE FOR DETAILS. 2. Colon, resection margin (donut), sigmoid - BENIGN COLONIC MUCOSA, NO EVIDENCE OF DYSPLASIA OR MALIGNANCY.  Diagnosis 12/08/2014  Colon, segmental resection for tumor, right colon MODERATELY DIFFERENTIATED ADENOCARCINOMA OF THE COLON (3.8 CM) THE TUMOR PENETRATES TO THE SURFACE OF THE VISCERAL PERITONEUM (T4A) LYMPHOVASCULAR INVASION IS IDENTIFIED MARGINS OF RESECTION ARE NEGATIVE FOR TUMOR NINTEEN BENIGN LYMPH NODE (0/19) ONE TUMOR DEPOSITE IS IDENTIFIED ([place])   RADIOGRAPHIC  STUDIES: I have personally reviewed the radiological images as listed and agreed with the findings in the report.  CT CAP w contrast 07/17/2016 IMPRESSION: 1. Today's study demonstrates progressive peritoneal carcinomatosis, as detailed above. Mildly enlarged hepatoduodenal ligament lymph node also noted. 2. No other metastatic disease noted elsewhere in the chest (previously noted right upper lobe pulmonary nodule remains stable over several prior examinations), abdomen or pelvis. 3. Aortic atherosclerosis, with ectasia of ascending thoracic aorta (4.3 cm in diameter). Recommend annual imaging followup by CTA or MRA. This recommendation follows 2010  CT CAP 04/17/16 IMPRESSION: 1. Slight interval improvement in extensive peritoneal carcinomatosis compared with prior CT from 02/02/2016. No definite disease progression. 2. No evidence of thoracic or hepatic metastases. 3. No definite bowel wall invasion, ascites or bowel obstruction. 4. Cholelithiasis. 5. Stable mild pulmonary nodularity, likely benign.  CT CAP 02/02/2016 IMPRESSION: 1. Extensive worsening of peritoneal spread of tumor with numerous large new tumor nodules scattered throughout The upper omentum and also scattered within along the mesentery. Mild extension into the right retroperitoneum at the level of the iliac crest. 2. Slight reduction in size of the right upper lobe pulmonary nodule 1.1 cm, previously 1.2 cm. Stable indistinct ground-glass density nodule in the right upper lobe. 3. Airway thickening is present, suggesting bronchitis or reactive airways disease. 4. Ascending aortic aneurysm 4.0 cm in diameter. Recommend annual imaging followup by CTA or MRA. This recommendation follows 2010 ACCF/AHA/AATS/ACR/ASA/SCA/SCAI/SIR/STS/SVM Guidelines for the Diagnosis and  Management of Patients with Thoracic Aortic Disease. Circulation. 2010; 121: e266-e369 5. Cholelithiasis.   PET 10/10/2015 IMPRESSION: 1.  Multiple enlarging, hypermetabolic peritoneal nodules consistent with peritoneal carcinomatosis. No generalized ascites. 2. No evidence of metastatic disease to the liver. 3. No suspicious pulmonary findings. Grossly stable chronic postinflammatory changes. 4. Multinodular goiter.  Diagnosis 11/04/2015 Peritoneum, biopsy, RLQ - METASTATIC COLORECTAL ADENOCARCINOMA. - SEE COMMENT.  ASSESSMENT & PLAN:  83 y.o. Caucasian female, with past medical history of colon diverticulosis and arthritis, but otherwise healthy and active, who was found to have a large sigmoid tumor, status post sigmoidectomy.  1. Sigmoid colon adenocarcinoma, pT3N0M0, stage II, G1, MSI stable, ascending colon adenocarcinoma, pT4aN1cM0, stage IIIB, MSI stable, G2, recurrent peritoneal carcinomatosis in 08/2015 -She had multifocal (2) colon cancer status post complete surgical resection,  -I previously reviewed her recent surgical pathology results in great details with her. The right colon cancer has several high-risk features, including stage IIIB, T4a lesion, tumor deposits, lymphovascular invasion, her risk of colon cancer recurrence is high.  -She tried adjuvant chemotherapy Xeloda, but could not tolerate full dose, and last cycle chemotherapy was canceled due to her tolerance issue. -I previously reviewed her recent PET scan from 10/10/2015, which unfortunately showed multiple enlarging, hypermetabolic peritoneal nodules consistent with peritoneal carcinomatosis. No ascites or other metastasis. -I previously discussed her peritoneal mass biopsy, which confirmed metastatic colon cancer  -We previously discussed that her cancer is not curable at this stage, she is not a candidate for debulking and HIPEC, and her goal of care is palliation.  -Her [facility] genomic testing results were discussed with the patient. She has (+) KRAS mutation, not a candidate for EGFR inhibitor   -Her tumor has MSI stable, not a good  candidate for immunotherapy alone  -She has started first-line palliative chemotherapy with 5-FU infusion and Avastin, tolerate treatment better w/o 5-fu bolus -We previously reviewed her staging CT scan from 04/17/2016, which showed partial response of her peritoneal metastasis, no other new lesions. -I previously reviewed her restaging CT scan image from 07/17/2016 with patient and her friend in person. She unfortunately has had disease progression in the peritoneal metastasis. -We previously discussed the option of switching her to second line chemotherapy, versus palliative care alone patient would like to try more chemotherapy.   -she is on second line irinotecan and Avastin  -due to her worsening diarrhea, I gave her chemo break after cycle 2, and will reduce Irinotecan dosage to 125m/m2 from cycle 3 -She has recovered well, lab reviewed, adequate for treatment, we'll proceed to cycle 3 chemotherapy and Avastin today.  2. Peripheral neuropathy, G 1 -From previous chemotherapy Xeloda, mild and stable now.  3. GERD -she'll continue Protonix.  4.Lung nodules  -She has two small right lung nodules which were discovered on the CT scan in 09/2012,  RUL nodule slightly bigger on recent CT -She has been follow-up with Dr. BCyndia Bent  -she never smoked, low risk for lung cancer -given the stability of the RUL nodule over 2 years, this is likely a benign lesion   5. Abdominal Pain -She has previously developed some abdominal pain lately, likely related to diarrhea and gas.  -She will take tylenol for the pain -Previously prescribed Tramadol. Take PRN up to 3 times a day.   6. HTN -Slightly elevated previously. No history of HTN, possible related to Avastin  -She will start monitoring and writing down her BP readings at home.  -continue meds   7. Hair Loss -  Patient's hair is starting to thin, secondary to chemo  -OK to use biotin  8. Support/social issue -Her husband's dementia has been  progressing and he has become aggressive and dangerous. -I have previously encouraged her to speak to our social worker.  -We previously discussed speaking to his doctor to get medication to help control his aggressive behavior.  -Her daughter would like to put him in a dementia facility.  9.Goal of care discussion  -We again discussed the incurable nature of her cancer, and the overall poor prognosis, especially if she does not have good response to chemotherapy or progress on chemo -The patient understands the goal of care is palliative. -I recommend DNR/DNI, she will think about it   Plan -Lab review, adequate for treatment, we'll proceed cycle 3 Irinotecan today with dose reduction to 1 20,000,000 g/m, continue Avastin  -Continue treatment every 2 weeks for now, if she has persistent side effects, I will consider changing her treatment to every 3 weeks. -Follow up in 2 weeks.  All questions were answered. The patient knows to call the clinic with any problems, questions or concerns.  I spent 20 minutes counseling the patient face to face. The total time spent in the appointment was 25  minutes and more than 50% was on counseling.  This document serves as a record of services personally performed by Heidi Merle, MD. It was created on her behalf by Maryla Morrow, a trained medical scribe. The creation of this record is based on the scribe's personal observations and the provider's statements to them. This document has been checked and approved by the attending provider.  I have reviewed the above documentation for accuracy and completeness, and I agree with the above information.      Heidi Marquez  09/07/2016

## 2016-09-07 ENCOUNTER — Ambulatory Visit: Payer: PPO

## 2016-09-07 ENCOUNTER — Ambulatory Visit (HOSPITAL_BASED_OUTPATIENT_CLINIC_OR_DEPARTMENT_OTHER): Payer: PPO

## 2016-09-07 ENCOUNTER — Other Ambulatory Visit (HOSPITAL_BASED_OUTPATIENT_CLINIC_OR_DEPARTMENT_OTHER): Payer: PPO

## 2016-09-07 ENCOUNTER — Ambulatory Visit (HOSPITAL_BASED_OUTPATIENT_CLINIC_OR_DEPARTMENT_OTHER): Payer: PPO | Admitting: Hematology

## 2016-09-07 VITALS — BP 156/78 | HR 67 | Temp 97.7°F | Resp 18 | Ht 63.0 in | Wt 130.7 lb

## 2016-09-07 VITALS — BP 137/78

## 2016-09-07 DIAGNOSIS — Z5112 Encounter for antineoplastic immunotherapy: Secondary | ICD-10-CM

## 2016-09-07 DIAGNOSIS — R109 Unspecified abdominal pain: Secondary | ICD-10-CM | POA: Diagnosis not present

## 2016-09-07 DIAGNOSIS — C786 Secondary malignant neoplasm of retroperitoneum and peritoneum: Secondary | ICD-10-CM | POA: Diagnosis not present

## 2016-09-07 DIAGNOSIS — C182 Malignant neoplasm of ascending colon: Secondary | ICD-10-CM

## 2016-09-07 DIAGNOSIS — C187 Malignant neoplasm of sigmoid colon: Secondary | ICD-10-CM

## 2016-09-07 DIAGNOSIS — Z95828 Presence of other vascular implants and grafts: Secondary | ICD-10-CM

## 2016-09-07 DIAGNOSIS — Z5111 Encounter for antineoplastic chemotherapy: Secondary | ICD-10-CM

## 2016-09-07 DIAGNOSIS — K219 Gastro-esophageal reflux disease without esophagitis: Secondary | ICD-10-CM | POA: Diagnosis not present

## 2016-09-07 DIAGNOSIS — C186 Malignant neoplasm of descending colon: Secondary | ICD-10-CM

## 2016-09-07 DIAGNOSIS — L658 Other specified nonscarring hair loss: Secondary | ICD-10-CM | POA: Diagnosis not present

## 2016-09-07 DIAGNOSIS — G620 Drug-induced polyneuropathy: Secondary | ICD-10-CM | POA: Diagnosis not present

## 2016-09-07 LAB — COMPREHENSIVE METABOLIC PANEL
ALT: 8 U/L (ref 0–55)
AST: 20 U/L (ref 5–34)
Albumin: 3.6 g/dL (ref 3.5–5.0)
Alkaline Phosphatase: 91 U/L (ref 40–150)
Anion Gap: 7 mEq/L (ref 3–11)
BUN: 18.8 mg/dL (ref 7.0–26.0)
CHLORIDE: 107 meq/L (ref 98–109)
CO2: 28 meq/L (ref 22–29)
CREATININE: 0.9 mg/dL (ref 0.6–1.1)
Calcium: 9.4 mg/dL (ref 8.4–10.4)
EGFR: 64 mL/min/{1.73_m2} — ABNORMAL LOW (ref >90)
Glucose: 124 mg/dl (ref 70–140)
POTASSIUM: 4.2 meq/L (ref 3.5–5.1)
SODIUM: 142 meq/L (ref 136–145)
Total Bilirubin: 0.56 mg/dL (ref 0.20–1.20)
Total Protein: 6.5 g/dL (ref 6.4–8.3)

## 2016-09-07 LAB — CBC WITH DIFFERENTIAL/PLATELET
BASO%: 1.3 % (ref 0.0–2.0)
BASOS ABS: 0.1 10*3/uL (ref 0.0–0.1)
EOS%: 2.4 % (ref 0.0–7.0)
Eosinophils Absolute: 0.1 10*3/uL (ref 0.0–0.5)
HCT: 36.2 % (ref 34.8–46.6)
HGB: 11.9 g/dL (ref 11.6–15.9)
LYMPH#: 0.7 10*3/uL — AB (ref 0.9–3.3)
LYMPH%: 12.9 % — AB (ref 14.0–49.7)
MCH: 31.3 pg (ref 25.1–34.0)
MCHC: 32.8 g/dL (ref 31.5–36.0)
MCV: 95.6 fL (ref 79.5–101.0)
MONO#: 0.4 10*3/uL (ref 0.1–0.9)
MONO%: 7.1 % (ref 0.0–14.0)
NEUT#: 4 10*3/uL (ref 1.5–6.5)
NEUT%: 76.3 % (ref 38.4–76.8)
Platelets: 165 10*3/uL (ref 145–400)
RBC: 3.78 10*6/uL (ref 3.70–5.45)
RDW: 14.8 % — ABNORMAL HIGH (ref 11.2–14.5)
WBC: 5.3 10*3/uL (ref 3.9–10.3)

## 2016-09-07 LAB — UA PROTEIN, DIPSTICK - CHCC: Protein, ur: NEGATIVE mg/dL

## 2016-09-07 MED ORDER — SODIUM CHLORIDE 0.9 % IV SOLN
Freq: Once | INTRAVENOUS | Status: AC
  Administered 2016-09-07: 13:00:00 via INTRAVENOUS

## 2016-09-07 MED ORDER — ATROPINE SULFATE 1 MG/ML IJ SOLN
INTRAMUSCULAR | Status: AC
  Filled 2016-09-07: qty 1

## 2016-09-07 MED ORDER — SODIUM CHLORIDE 0.9 % IV SOLN
300.0000 mg | Freq: Once | INTRAVENOUS | Status: AC
  Administered 2016-09-07: 300 mg via INTRAVENOUS
  Filled 2016-09-07: qty 12

## 2016-09-07 MED ORDER — PALONOSETRON HCL INJECTION 0.25 MG/5ML
0.2500 mg | Freq: Once | INTRAVENOUS | Status: AC
  Administered 2016-09-07: 0.25 mg via INTRAVENOUS

## 2016-09-07 MED ORDER — IRINOTECAN HCL CHEMO INJECTION 100 MG/5ML
120.0000 mg/m2 | Freq: Once | INTRAVENOUS | Status: AC
  Administered 2016-09-07: 200 mg via INTRAVENOUS
  Filled 2016-09-07: qty 10

## 2016-09-07 MED ORDER — SODIUM CHLORIDE 0.9% FLUSH
10.0000 mL | INTRAVENOUS | Status: DC | PRN
  Administered 2016-09-07: 10 mL
  Filled 2016-09-07: qty 10

## 2016-09-07 MED ORDER — ATROPINE SULFATE 1 MG/ML IJ SOLN
0.5000 mg | Freq: Once | INTRAMUSCULAR | Status: AC | PRN
  Administered 2016-09-07: 0.5 mg via INTRAVENOUS

## 2016-09-07 MED ORDER — SODIUM CHLORIDE 0.9% FLUSH
10.0000 mL | INTRAVENOUS | Status: DC | PRN
  Filled 2016-09-07: qty 10

## 2016-09-07 MED ORDER — PALONOSETRON HCL INJECTION 0.25 MG/5ML
INTRAVENOUS | Status: AC
  Filled 2016-09-07: qty 5

## 2016-09-07 MED ORDER — DEXAMETHASONE SODIUM PHOSPHATE 10 MG/ML IJ SOLN
INTRAMUSCULAR | Status: AC
  Filled 2016-09-07: qty 1

## 2016-09-07 MED ORDER — HEPARIN SOD (PORK) LOCK FLUSH 100 UNIT/ML IV SOLN
500.0000 [IU] | Freq: Once | INTRAVENOUS | Status: AC | PRN
  Administered 2016-09-07: 500 [IU]
  Filled 2016-09-07: qty 5

## 2016-09-07 MED ORDER — SODIUM CHLORIDE 0.9 % IV SOLN: Freq: Once | INTRAVENOUS | Status: DC

## 2016-09-07 MED ORDER — DEXAMETHASONE SODIUM PHOSPHATE 10 MG/ML IJ SOLN
10.0000 mg | Freq: Once | INTRAMUSCULAR | Status: AC
  Administered 2016-09-07: 10 mg via INTRAVENOUS

## 2016-09-07 NOTE — Patient Instructions (Signed)
[facility] Discharge Instructions for Patients Receiving Chemotherapy  Today you received the following chemotherapy agents:  Irinotecan and Avastin.  To help prevent nausea and vomiting after your treatment, we encourage you to take your nausea medication as prescribed.   If you develop nausea and vomiting that is not controlled by your nausea medication, call the clinic.   BELOW ARE SYMPTOMS THAT SHOULD BE REPORTED IMMEDIATELY:  *FEVER GREATER THAN 100.5 F  *CHILLS WITH OR WITHOUT FEVER  NAUSEA AND VOMITING THAT IS NOT CONTROLLED WITH YOUR NAUSEA MEDICATION  *UNUSUAL SHORTNESS OF BREATH  *UNUSUAL BRUISING OR BLEEDING  TENDERNESS IN MOUTH AND THROAT WITH OR WITHOUT PRESENCE OF ULCERS  *URINARY PROBLEMS  *BOWEL PROBLEMS  UNUSUAL RASH Items with * indicate a potential emergency and should be followed up as soon as possible.  Feel free to call the clinic you have any questions or concerns. The clinic phone number is [phone].  Please show the [facility] at check-in to the Emergency Department and triage nurse.

## 2016-09-07 NOTE — Patient Instructions (Signed)

## 2016-09-20 ENCOUNTER — Ambulatory Visit (HOSPITAL_BASED_OUTPATIENT_CLINIC_OR_DEPARTMENT_OTHER): Payer: PPO | Admitting: Hematology

## 2016-09-20 ENCOUNTER — Other Ambulatory Visit (HOSPITAL_BASED_OUTPATIENT_CLINIC_OR_DEPARTMENT_OTHER): Payer: PPO

## 2016-09-20 ENCOUNTER — Telehealth: Payer: Self-pay | Admitting: Hematology

## 2016-09-20 ENCOUNTER — Ambulatory Visit (HOSPITAL_BASED_OUTPATIENT_CLINIC_OR_DEPARTMENT_OTHER): Payer: PPO

## 2016-09-20 ENCOUNTER — Encounter: Payer: Self-pay | Admitting: Hematology

## 2016-09-20 ENCOUNTER — Ambulatory Visit: Payer: PPO

## 2016-09-20 VITALS — BP 122/73 | HR 82

## 2016-09-20 VITALS — BP 143/82 | HR 82 | Temp 98.4°F | Resp 18 | Ht 63.0 in | Wt 129.6 lb

## 2016-09-20 DIAGNOSIS — Z5111 Encounter for antineoplastic chemotherapy: Secondary | ICD-10-CM

## 2016-09-20 DIAGNOSIS — C187 Malignant neoplasm of sigmoid colon: Secondary | ICD-10-CM | POA: Diagnosis not present

## 2016-09-20 DIAGNOSIS — Z5112 Encounter for antineoplastic immunotherapy: Secondary | ICD-10-CM

## 2016-09-20 DIAGNOSIS — C182 Malignant neoplasm of ascending colon: Secondary | ICD-10-CM

## 2016-09-20 DIAGNOSIS — L658 Other specified nonscarring hair loss: Secondary | ICD-10-CM

## 2016-09-20 DIAGNOSIS — G620 Drug-induced polyneuropathy: Secondary | ICD-10-CM | POA: Diagnosis not present

## 2016-09-20 DIAGNOSIS — R109 Unspecified abdominal pain: Secondary | ICD-10-CM

## 2016-09-20 DIAGNOSIS — R197 Diarrhea, unspecified: Secondary | ICD-10-CM | POA: Diagnosis not present

## 2016-09-20 DIAGNOSIS — K219 Gastro-esophageal reflux disease without esophagitis: Secondary | ICD-10-CM | POA: Diagnosis not present

## 2016-09-20 DIAGNOSIS — Z95828 Presence of other vascular implants and grafts: Secondary | ICD-10-CM

## 2016-09-20 DIAGNOSIS — C786 Secondary malignant neoplasm of retroperitoneum and peritoneum: Secondary | ICD-10-CM | POA: Diagnosis not present

## 2016-09-20 DIAGNOSIS — C186 Malignant neoplasm of descending colon: Secondary | ICD-10-CM

## 2016-09-20 LAB — COMPREHENSIVE METABOLIC PANEL
ALBUMIN: 3.2 g/dL — AB (ref 3.5–5.0)
ALK PHOS: 126 U/L (ref 40–150)
ALT: 12 U/L (ref 0–55)
AST: 17 U/L (ref 5–34)
Anion Gap: 7 mEq/L (ref 3–11)
BILIRUBIN TOTAL: 1.06 mg/dL (ref 0.20–1.20)
BUN: 12.2 mg/dL (ref 7.0–26.0)
CALCIUM: 9 mg/dL (ref 8.4–10.4)
CO2: 26 mEq/L (ref 22–29)
CREATININE: 0.8 mg/dL (ref 0.6–1.1)
Chloride: 108 mEq/L (ref 98–109)
EGFR: 67 mL/min/{1.73_m2} — ABNORMAL LOW (ref >90)
Glucose: 122 mg/dl (ref 70–140)
POTASSIUM: 3.3 meq/L — AB (ref 3.5–5.1)
Sodium: 141 mEq/L (ref 136–145)
Total Protein: 6.1 g/dL — ABNORMAL LOW (ref 6.4–8.3)

## 2016-09-20 LAB — CBC WITH DIFFERENTIAL/PLATELET
BASO%: 1 % (ref 0.0–2.0)
Basophils Absolute: 0 10*3/uL (ref 0.0–0.1)
EOS%: 5.7 % (ref 0.0–7.0)
Eosinophils Absolute: 0.1 10*3/uL (ref 0.0–0.5)
HCT: 32.6 % — ABNORMAL LOW (ref 34.8–46.6)
HGB: 10.9 g/dL — ABNORMAL LOW (ref 11.6–15.9)
LYMPH#: 0.4 10*3/uL — AB (ref 0.9–3.3)
LYMPH%: 17.1 % (ref 14.0–49.7)
MCH: 31.2 pg (ref 25.1–34.0)
MCHC: 33.4 g/dL (ref 31.5–36.0)
MCV: 93.4 fL (ref 79.5–101.0)
MONO#: 0.4 10*3/uL (ref 0.1–0.9)
MONO%: 16.2 % — ABNORMAL HIGH (ref 0.0–14.0)
NEUT#: 1.5 10*3/uL (ref 1.5–6.5)
NEUT%: 60 % (ref 38.4–76.8)
PLATELETS: 157 10*3/uL (ref 145–400)
RBC: 3.49 10*6/uL — AB (ref 3.70–5.45)
RDW: 14.3 % (ref 11.2–14.5)
WBC: 2.5 10*3/uL — AB (ref 3.9–10.3)

## 2016-09-20 LAB — CEA (IN HOUSE-CHCC): CEA (CHCC-IN HOUSE): 270.53 ng/mL — AB (ref 0.00–5.00)

## 2016-09-20 MED ORDER — DEXAMETHASONE SODIUM PHOSPHATE 10 MG/ML IJ SOLN
INTRAMUSCULAR | Status: AC
  Filled 2016-09-20: qty 1

## 2016-09-20 MED ORDER — DIPHENOXYLATE-ATROPINE 2.5-0.025 MG PO TABS: 1.0000 | ORAL_TABLET | Freq: Four times a day (QID) | ORAL | 2 refills | Status: DC | PRN

## 2016-09-20 MED ORDER — SODIUM CHLORIDE 0.9% FLUSH
10.0000 mL | INTRAVENOUS | Status: DC | PRN
  Administered 2016-09-20: 10 mL via INTRAVENOUS
  Filled 2016-09-20: qty 10

## 2016-09-20 MED ORDER — PALONOSETRON HCL INJECTION 0.25 MG/5ML
0.2500 mg | Freq: Once | INTRAVENOUS | Status: AC
  Administered 2016-09-20: 0.25 mg via INTRAVENOUS

## 2016-09-20 MED ORDER — SODIUM CHLORIDE 0.9 % IV SOLN
Freq: Once | INTRAVENOUS | Status: AC
  Administered 2016-09-20: 13:00:00 via INTRAVENOUS

## 2016-09-20 MED ORDER — SODIUM CHLORIDE 0.9% FLUSH
10.0000 mL | INTRAVENOUS | Status: DC | PRN
  Administered 2016-09-20: 10 mL
  Filled 2016-09-20: qty 10

## 2016-09-20 MED ORDER — HEPARIN SOD (PORK) LOCK FLUSH 100 UNIT/ML IV SOLN
500.0000 [IU] | Freq: Once | INTRAVENOUS | Status: AC | PRN
  Administered 2016-09-20: 500 [IU]
  Filled 2016-09-20: qty 5

## 2016-09-20 MED ORDER — ATROPINE SULFATE 1 MG/ML IJ SOLN
INTRAMUSCULAR | Status: AC
  Filled 2016-09-20: qty 1

## 2016-09-20 MED ORDER — PALONOSETRON HCL INJECTION 0.25 MG/5ML
INTRAVENOUS | Status: AC
  Filled 2016-09-20: qty 5

## 2016-09-20 MED ORDER — IRINOTECAN HCL CHEMO INJECTION 100 MG/5ML
120.0000 mg/m2 | Freq: Once | INTRAVENOUS | Status: AC
  Administered 2016-09-20: 200 mg via INTRAVENOUS
  Filled 2016-09-20: qty 10

## 2016-09-20 MED ORDER — PROCHLORPERAZINE MALEATE 10 MG PO TABS: 10.0000 mg | ORAL_TABLET | Freq: Three times a day (TID) | ORAL | 2 refills | Status: AC | PRN

## 2016-09-20 MED ORDER — ATROPINE SULFATE 1 MG/ML IJ SOLN
0.5000 mg | Freq: Once | INTRAMUSCULAR | Status: AC | PRN
  Administered 2016-09-20: 0.5 mg via INTRAVENOUS

## 2016-09-20 MED ORDER — SODIUM CHLORIDE 0.9 % IV SOLN
300.0000 mg | Freq: Once | INTRAVENOUS | Status: AC
  Administered 2016-09-20: 300 mg via INTRAVENOUS
  Filled 2016-09-20: qty 12

## 2016-09-20 MED ORDER — DEXAMETHASONE SODIUM PHOSPHATE 10 MG/ML IJ SOLN
10.0000 mg | Freq: Once | INTRAMUSCULAR | Status: AC
  Administered 2016-09-20: 10 mg via INTRAVENOUS

## 2016-09-20 NOTE — Telephone Encounter (Signed)
R/s per 8/9 los - Gave patient AVS and calender per los.

## 2016-09-20 NOTE — Patient Instructions (Signed)

## 2016-09-20 NOTE — Patient Instructions (Signed)
[facility] Discharge Instructions for Patients Receiving Chemotherapy  Today you received the following chemotherapy agents:  Irinotecan and Avastin.  To help prevent nausea and vomiting after your treatment, we encourage you to take your nausea medication as prescribed.   If you develop nausea and vomiting that is not controlled by your nausea medication, call the clinic.   BELOW ARE SYMPTOMS THAT SHOULD BE REPORTED IMMEDIATELY:  *FEVER GREATER THAN 100.5 F  *CHILLS WITH OR WITHOUT FEVER  NAUSEA AND VOMITING THAT IS NOT CONTROLLED WITH YOUR NAUSEA MEDICATION  *UNUSUAL SHORTNESS OF BREATH  *UNUSUAL BRUISING OR BLEEDING  TENDERNESS IN MOUTH AND THROAT WITH OR WITHOUT PRESENCE OF ULCERS  *URINARY PROBLEMS  *BOWEL PROBLEMS  UNUSUAL RASH Items with * indicate a potential emergency and should be followed up as soon as possible.  Feel free to call the clinic you have any questions or concerns. The clinic phone number is [phone].  Please show the [facility] at check-in to the Emergency Department and triage nurse.

## 2016-09-20 NOTE — Progress Notes (Signed)
- [place]  Telephone:([phone] Fax:(336) [phone]  Clinic Follow Up Note   Patient Care Team: Heidi Solian, MD as PCP - General (Internal Medicine) Heidi Pollack, MD as Consulting Physician (Cardiothoracic Surgery) Heidi Mayer, MD as Consulting Physician (Gastroenterology) Heidi Ruff, MD as Consulting Physician (General Surgery) Heidi Ade, RN as Registered Nurse (Medical Oncology) 09/20/2016   CHIEF COMPLAINTS:  Follow-up of colon cancer  Oncology History   Cancer of left colon [facility])   Staging form: Colon and Rectum, AJCC 7th Edition     Pathologic stage from 05/17/2014: Stage IIA (T3, N0, cM0) - Signed by Heidi Merle, MD on 02/21/2015 Cancer of right colon [facility])   Staging form: Colon and Rectum, AJCC 7th Edition     Pathologic stage from 12/08/2014: Stage IIIB (T4a, N1a, cM0) - Signed by Heidi Merle, MD on 02/21/2015       Cancer of right colon ([place])   04/09/2014 Procedure    Colonoscopy per Dr. Silvano Marquez: Large fleshy mass sigmoid colon 25 cm from anus. Could not pass through.      04/09/2014 Tumor Marker    CEA =11.7      04/12/2014 Imaging    CT C/A/P:sigmoid colon mass;several small areas of soft tissue nodularity in peritoneal cavity; small attenuation structure posterior right hepatic lobe; 2 stable nodules in lungs dating back to 2014. No acute findings.      05/17/2014 Initial Diagnosis    Colon cancer-presented with heme + stool and more frequent BMs      05/17/2014 Surgery    Robot assisted sigmoidectomy      05/17/2014 Pathologic Stage    pT3,pN0,pMX--Grade 1--0/13 nodes +--MMR normal      05/17/2014 Clinical Stage    Stage II      12/08/2014 Surgery    right hemicolectomy       12/08/2014 Pathology Results    right colon adenocarcinoma, pT4aN1c, 19 nodes were negative, LVI(+), MMR normal       01/17/2015 - 06/27/2015 Chemotherapy    adjuvant capecitabine 1572m q12h, 2 weeks on and 1 week off. dose reduction and  stopped after 6 cycles due to poor tolerance,       08/15/2015 Progression    CT abdomen showed peritoneal nodules, highly suspicious for recurrent metastasis      10/10/2015 Imaging    PET scan showed multiple enlarging, hypermetabolic peritoneal nodules consistent with peritoneal carcinomatosis. No other evidence of metastasis      11/04/2015 Pathology Results    Diagnosis 11/04/2015 Peritoneum, biopsy, RLQ - METASTATIC COLORECTAL ADENOCARCINOMA. - SEE COMMENT. -[facility]      02/02/2016 Imaging    CT CAP W CONTRAST 02/02/2016 IMPRESSION: 1. Extensive worsening of peritoneal spread of tumor with numerous large new tumor nodules scattered throughout The upper omentum and also scattered within along the mesentery. Mild extension into the right retroperitoneum at the level of the iliac crest. 2. Slight reduction in size of the right upper lobe pulmonary nodule 1.1 cm, previously 1.2 cm. Stable indistinct ground-glass density nodule in the right upper lobe. 3. Airway thickening is present, suggesting bronchitis or reactive airways disease. 4. Ascending aortic aneurysm 4.0 cm in diameter. Recommend annual imaging followup by CTA or MRA. This recommendation follows 2010 ACCF/AHA/AATS/ACR/ASA/SCA/SCAI/SIR/STS/SVM Guidelines for the Diagnosis and Management of Patients with Thoracic Aortic Disease. Circulation. 2010; 121: e266-e369 5. Cholelithiasis.      02/08/2016 -  Chemotherapy    5-fu infusion and Avastin every 2 weeks  04/17/2016 Imaging    CT CAP  IMPRESSION: 1. Slight interval improvement in extensive peritoneal carcinomatosis compared with prior CT from 02/02/2016. No definite disease progression. 2. No evidence of thoracic or hepatic metastases. 3. No definite bowel wall invasion, ascites or bowel obstruction. 4. Cholelithiasis. 5. Stable mild pulmonary nodularity, likely benign.      07/17/2016 Imaging    CT CAP w contrast IMPRESSION: 1. Today's study  demonstrates progressive peritoneal carcinomatosis, as detailed above. Mildly enlarged hepatoduodenal ligament lymph node also noted. 2. No other metastatic disease noted elsewhere in the chest (previously noted right upper lobe pulmonary nodule remains stable over several prior examinations), abdomen or pelvis. 3. Aortic atherosclerosis, with ectasia of ascending thoracic aorta (4.3 cm in diameter). Recommend annual imaging followup by CTA or MRA. This recommendation follows 2010       07/19/2016 -  Chemotherapy    Irinotecan + bevacizumab Q14d --Cycle #1, 86/57/84: Complicated by protracted neutropenia, grade 2 diarrhea, grade 1 nausea --Cycle #2, 08/09/16: delayed due to neutropenia (ANC 0.4 on 08/02/16)        HISTORY OF PRESENTING ILLNESS:  Heidi Marquez 83 y.o. female is here because of recently diagnosed a stage II sigmoid colon cancer.  She had annual physical with her PCP in early Feb this year, and stool OB (+), no overt GI bleeding, CBC was normal. She had noticed some loose BM before that, but no nausea, loss of appetite, weight loss or other symoptoms. Colonoscopy showed a large mass in the sigmoid colon 25 cm from anus. Biopsy was taken which showed tubulovillous adenoma, no malignancy. She was referred to surgeon Dr. Marcello Marquez and underwent robotic-assisted sigmoidectomy on 4/4. She tolerated the procedure well, and was discharged home afterwards.  She has recovered well from the surgery 3 weeks ago, she has mild fatigue, but otherwise doing very well without other complains. She had 2-3 episodes of bleeding after the surgery, which was related to her herromods. She is eating well. 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   She has chronic knee pain she takes ibuprofen once daily.    CURRENT THERAPY: second line chemotherapy Irinotecan 153m/m2 and Avastin every 2 weeks, started on 07/19/2016, irinotecan dose reduced to  12109mm2 from cycle 3  INTERIM HISTORY:  Heidi Marquez for follow-up and 4th cycle chemotherapy accompanied by her friend. Following her last cycle chemo, she had diarrhea. She alternated between her diarrhea medication taking a combined 5 tablets a day. She also had some nausea. She believes her diarrhea and nausea can be controlled with her medication. The chemotherapy effects her appetite and taste. Her energy level is lower but she tries to keep going. She has some bloating and cramps while she has diarrhea and nausea, but she denies any today.  Since her chemo reduction on the last cycle, she feels a small difference.     MEDICAL HISTORY:  Past Medical History:  Diagnosis Date  . [facility]itt Marquez[facility]  last chemo 01-17-2015 to 06-27-2015)   dx 05-17-2014 sigmoid (left) cancer, Stage 2, G1 (pT3N0M0), MSI stable -- s/p  sigmoidectomy /  dx 12-08-2014 ascending colon cancer (right) , Stage 3B, G2, (pT4aN1cM0), MSI stable -- s/p  right hemicolectomy/  08-2015  recurrent peritoneal carcinomatosis  . Arthritis    knees  . Coronary atherosclerosis   . Diverticulosis of colon   . GERD (gastroesophageal reflux disease)   . HH (hiatus  hernia)   . History of basal cell carcinoma excision    right side nose 2013  s/p  moh's  . History of epistaxis    2005  caurterized twice  . History of esophageal dilatation    2009 for stricture  . History of esophagitis    reflux  . Internal hemorrhoids   . Macular degeneration of both eyes   . Multinodular thyroid    remote hx benign bx 2005  . Peritoneal carcinomatosis ([place])    secondary to primary colon cancer  . Pulmonary nodule, right    noted since 2014-- stable per last ct 06-15-2015  . Thoracic ascending aortic aneurysm [facility])    followed by dr Cyndia Bent ([facility])  last ct 06-15-2015  4.3cm    SURGICAL HISTORY: Past Surgical History:  Procedure Laterality Date  . COLONOSCOPY WITH PROPOFOL N/A  09/30/2014   Procedure: COLONOSCOPY WITH PROPOFOL;  Surgeon: Milus Banister, MD;  Location: [facility];  Service: Endoscopy;  Laterality: N/A;  . ESOPHAGOGASTRODUODENOSCOPY  2009  . INGUINAL HERNIA REPAIR Right early 1990s  . MOHS SURGERY  11/2011   nose  . PORTACATH PLACEMENT Right 01/24/2016   Procedure: INSERTION PORT-A-CATH;  Surgeon: Heidi Ruff, MD;  Location: [facility];  Service: General;  Laterality: Right;  . ROBOTIC ASSISTED RIGHT HEMICOLECTOMY  67/01/4579    dr Heidi Marquez  . ROBOTIC ASSISTED SIGMOIDECTOMY  99/83/3825   dr Heidi Marquez  . UPPER GASTROINTESTINAL ENDOSCOPY    . VAGINAL HYSTERECTOMY  age 75   partial with the bladder tack    SOCIAL HISTORY: Social History   Social History  . Marital status: Married    Spouse name: N/A  . Number of children: N/A  . Years of education: N/A   Occupational History  . Not on file.   Social History Main Topics  . Smoking status: Never Smoker  . Smokeless tobacco: Never Used  . Alcohol use No  . Drug use: No  . Sexual activity: Not on file   Other Topics Concern  . Not on file   Social History Narrative   Married, husband is disabled (physical and dementia) and she is caregiver   Has #1 child (daughter) living. Son died of MI at age 79   Worked at [employer] before she retired   Has #3 cats    FAMILY HISTORY: Family History  Problem Relation Age of Onset  . Colon cancer Paternal Grandfather 68       colon cancer   . Stroke Father   . Heart attack Son 29       died of heart attack   . Esophageal cancer Neg Hx   . Rectal cancer Neg Hx   . Stomach cancer Neg Hx     ALLERGIES:  is allergic to sulfa antibiotics; amoxicillin-pot clavulanate; and penicillins.  MEDICATIONS:  Current Outpatient Prescriptions  Medication Sig Dispense Refill  . alendronate (FOSAMAX) 70 MG tablet Take 70 mg by mouth every 7 (seven) days. Sundays. Take with a full glass of water on an empty stomach.     Marland Kitchen amLODipine (NORVASC) 5 MG tablet Take 1 tablet (5 mg total) by mouth daily. 30 tablet 2  . Ascorbic Acid (VITAMIN C) 1000 MG tablet Take 1,000 mg by mouth daily.    . Biotin 1000 MCG tablet Take 1,000 mcg by mouth daily.     Marland Kitchen CALCIUM & MAGNESIUM CARBONATES PO Take 1 tablet by mouth daily.    . cholecalciferol (VITAMIN  D) 1000 UNITS tablet Take 1,000 Units by mouth daily.    . diphenoxylate-atropine (LOMOTIL) 2.5-0.025 MG tablet Take 1-2 tablets by mouth 4 (four) times daily as needed for diarrhea or loose stools. 30 tablet 2  . glucosamine-chondroitin 500-400 MG tablet Take 1 tablet by mouth daily.     . Homeopathic Products ([employer] LEG CRAMPS PAIN RELIEF PO) Take 1 tablet by mouth daily as needed (for leg cramps).    Marland Kitchen ibuprofen (ADVIL,MOTRIN) 200 MG tablet Take 200 mg by mouth daily as needed for moderate pain.     Marland Kitchen lidocaine-prilocaine (EMLA) cream Apply to affected area once 30 g 3  . loratadine (CLARITIN) 10 MG tablet Take 10 mg by mouth daily.    . Multiple Vitamin (MULTIVITAMIN) tablet Take 1 tablet by mouth daily.    . potassium chloride SA (K-DUR,KLOR-CON) 20 MEQ tablet Take 1 tablet (20 mEq total) by mouth daily. (Patient taking differently: Take 20 mEq by mouth every other day. ) 20 tablet 1  . prochlorperazine (COMPAZINE) 10 MG tablet Take 1 tablet (10 mg total) by mouth every 8 (eight) hours as needed for nausea (Nausea or vomiting). 30 tablet 2  . ranitidine (ZANTAC) 150 MG tablet Take 150 mg by mouth as needed for heartburn.    . vitamin B-12 (CYANOCOBALAMIN) 1000 MCG tablet Take 1,000 mcg by mouth daily.     . ondansetron (ZOFRAN) 8 MG tablet Take 1 tablet (8 mg total) by mouth every 8 (eight) hours as needed (Nausea or vomiting). (Patient not taking: Reported on 09/20/2016) 30 tablet 1  . traMADol (ULTRAM) 50 MG tablet Take 1 tablet (50 mg total) by mouth every 6 (six) hours as needed. (Patient not taking: Reported on 09/20/2016) 30 tablet 1   No current facility-administered  medications for this visit.    Facility-Administered Medications Ordered in Other Visits  Medication Dose Route Frequency Provider Last Rate Last Dose  . sodium chloride flush (NS) 0.9 % injection 10 mL  10 mL Intracatheter PRN Heidi Merle, MD   10 mL at 07/19/16 1515    REVIEW OF SYSTEMS:  Constitutional: Denies fevers, chills or abnormal night sweats (+)fatigue Eyes: Denies blurriness of vision, double vision or watery eyes Ears, nose, mouth, throat, and face: Denies mucositis or sore throat Respiratory: Denies cough, dyspnea or wheezes Cardiovascular: Denies palpitation, chest discomfort or lower extremity swelling Gastrointestinal:  Denies , heartburn or change in bowel habits (+) frequent bowel movement (+)nausea Skin: Denies abnormal skin rashes Lymphatics: Denies new lymphadenopathy or easy bruising Neurological:Denies numbness, or new weaknesses  Behavioral/Psych: Mood is stable, no new changes  All other systems were reviewed with the patient and are negative.   PHYSICAL EXAMINATION:  ECOG PERFORMANCE STATUS: 1  Vitals:   09/20/16 1153  BP: (!) 143/82  Pulse: 82  Resp: 18  Temp: 98.4 F (36.9 C)  SpO2: 100%   Filed Weights   09/20/16 1153  Weight: 129 lb 9.6 oz (58.8 kg)     GENERAL:alert, no distress and comfortable SKIN: skin color, texture, turgor are normal  or significant lesions   EYES: normal, conjunctiva are pink and non-injected, sclera clear OROPHARYNX:no exudate, no erythema and lips, buccal mucosa, and tongue normal  NECK: supple, thyroid normal size, non-tender, without nodularity LYMPH:  no palpable lymphadenopathy in the cervical, axillary or inguinal LUNGS: clear to auscultation and percussion with normal breathing effort HEART: regular rate & rhythm and no murmurs and no lower extremity edema ABDOMEN:abdomen soft, surgical incision and to the  low abdomen is well healed. non-tender and normal bowel sounds, rectal exam showed internal and external  hemorrhoids, no bleeding, stool was green. Musculoskeletal:no cyanosis of digits and no clubbing  PSYCH: alert & oriented x 3 with fluent speech NEURO: no focal motor/sensory deficits   LABORATORY DATA:  I have reviewed the data as listed CBC Latest Ref Rng & Units 09/20/2016 09/07/2016 08/23/2016  WBC 3.9 - 10.3 10e3/uL 2.5(L) 5.3 3.3(L)  Hemoglobin 11.6 - 15.9 g/dL 10.9(L) 11.9 11.0(L)  Hematocrit 34.8 - 46.6 % 32.6(L) 36.2 32.4(L)  Platelets 145 - 400 10e3/uL 157 165 180   CMP Latest Ref Rng & Units 09/20/2016 09/07/2016 08/23/2016  Glucose 70 - 140 mg/dl 122 124 115  BUN 7.0 - 26.0 mg/dL 12.2 18.8 17.1  Creatinine 0.6 - 1.1 mg/dL 0.8 0.9 0.8  Sodium 136 - 145 mEq/L 141 142 144  Potassium 3.5 - 5.1 mEq/L 3.3(L) 4.2 3.2(L)  Chloride 101 - 111 mmol/L - - -  CO2 22 - 29 mEq/L _0 Calcium 8.4 - 10.4 mg/dL 9.0 9.4 8.7  Total Protein 6.4 - 8.3 g/dL 6.1(L) 6.5 6.2(L)  Total Bilirubin 0.20 - 1.20 mg/dL 1.06 0.56 0.66  Alkaline Phos 40 - 150 U/L 126 91 84  AST 5 - 34 U/L _1 ALT 0 - 55 U/L _2 ANC 1.5K today   CEA 05/02/2015: 2.6 08/15/2015: 9.6 12/16/2015: 253 03/08/2016: 463 04/05/16: 443.6 05/03/16: 347.82 05/31/16: 311.27 07/05/2016: 313.82 08/02/2016: 314.33 09/20/2016: 270.53  Pathology report:  Diagnosis 11/04/2015 Peritoneum, biopsy, RLQ - METASTATIC COLORECTAL ADENOCARCINOMA. - SEE COMMENT.  Diagnosis 05/17/2014 1. Colon, segmental resection for tumor, sigmoid, open end proximal - INVASIVE ADENOCARCINOMA WITH EXTRACELLULAR MUCIN, INVADING THROUGH THE MUSCULARIS PROPRIA INTO PERICOLONIC FATTY TISSUE. - THIRTEEN LYMPH NODES, NEGATIVE FOR METASTATIC CARCINOMA (0/13). - MULTIPLE DIVERTICULA. - RESECTION MARGINS, NEGATIVE FOR ATYPIA OR MALIGNANCY. - PLEASE SEE ONCOLOGY TEMPLATE FOR DETAILS. 2. Colon, resection margin (donut), sigmoid - BENIGN COLONIC MUCOSA, NO EVIDENCE OF DYSPLASIA OR MALIGNANCY.  Diagnosis 12/08/2014  Colon, segmental resection for tumor, right  colon MODERATELY DIFFERENTIATED ADENOCARCINOMA OF THE COLON (3.8 CM) THE TUMOR PENETRATES TO THE SURFACE OF THE VISCERAL PERITONEUM (T4A) LYMPHOVASCULAR INVASION IS IDENTIFIED MARGINS OF RESECTION ARE NEGATIVE FOR TUMOR NINTEEN BENIGN LYMPH NODE (0/19) ONE TUMOR DEPOSITE IS IDENTIFIED ([place])   RADIOGRAPHIC STUDIES: I have personally reviewed the radiological images as listed and agreed with the findings in the report.  CT CAP w contrast 07/17/2016 IMPRESSION: 1. Today's study demonstrates progressive peritoneal carcinomatosis, as detailed above. Mildly enlarged hepatoduodenal ligament lymph node also noted. 2. No other metastatic disease noted elsewhere in the chest (previously noted right upper lobe pulmonary nodule remains stable over several prior examinations), abdomen or pelvis. 3. Aortic atherosclerosis, with ectasia of ascending thoracic aorta (4.3 cm in diameter). Recommend annual imaging followup by CTA or MRA. This recommendation follows 2010  CT CAP 04/17/16 IMPRESSION: 1. Slight interval improvement in extensive peritoneal carcinomatosis compared with prior CT from 02/02/2016. No definite disease progression. 2. No evidence of thoracic or hepatic metastases. 3. No definite bowel wall invasion, ascites or bowel obstruction. 4. Cholelithiasis. 5. Stable mild pulmonary nodularity, likely benign.  CT CAP 02/02/2016 IMPRESSION: 1. Extensive worsening of peritoneal spread of tumor with numerous large new tumor nodules scattered throughout The upper omentum and also scattered within along the mesentery. Mild extension into the right retroperitoneum at the level of the iliac crest. 2. Slight reduction in size of the right  upper lobe pulmonary nodule 1.1 cm, previously 1.2 cm. Stable indistinct ground-glass density nodule in the right upper lobe. 3. Airway thickening is present, suggesting bronchitis or reactive airways disease. 4. Ascending aortic aneurysm 4.0 cm in  diameter. Recommend annual imaging followup by CTA or MRA. This recommendation follows 2010 ACCF/AHA/AATS/ACR/ASA/SCA/SCAI/SIR/STS/SVM Guidelines for the Diagnosis and Management of Patients with Thoracic Aortic Disease. Circulation. 2010; 121: e266-e369 5. Cholelithiasis.   PET 10/10/2015 IMPRESSION: 1. Multiple enlarging, hypermetabolic peritoneal nodules consistent with peritoneal carcinomatosis. No generalized ascites. 2. No evidence of metastatic disease to the liver. 3. No suspicious pulmonary findings. Grossly stable chronic postinflammatory changes. 4. Multinodular goiter.  Diagnosis 11/04/2015 Peritoneum, biopsy, RLQ - METASTATIC COLORECTAL ADENOCARCINOMA. - SEE COMMENT.  ASSESSMENT & PLAN:  83 y.o. Caucasian female, with past medical history of colon diverticulosis and arthritis, but otherwise healthy and active, who was found to have a large sigmoid tumor, status post sigmoidectomy.  1. Sigmoid colon adenocarcinoma, pT3N0M0, stage II, G1, MSI stable, ascending colon adenocarcinoma, pT4aN1cM0, stage IIIB, MSI stable, G2, KRAS mutation (+), recurrent peritoneal carcinomatosis in 08/2015 -She had multifocal (2) colon cancer status post complete surgical resection,  -I previously reviewed her recent surgical pathology results in great details with her. The right colon cancer has several high-risk features, including stage IIIB, T4a lesion, tumor deposits, lymphovascular invasion, her risk of colon cancer recurrence is high.  -She tried adjuvant chemotherapy Xeloda, but could not tolerate full dose, and last cycle chemotherapy was canceled due to her tolerance issue. -I previously reviewed her recent PET scan from 10/10/2015, which unfortunately showed multiple enlarging, hypermetabolic peritoneal nodules consistent with peritoneal carcinomatosis. No ascites or other metastasis. -I previously discussed her peritoneal mass biopsy, which confirmed metastatic colon cancer  -We  previously discussed that her cancer is not curable at this stage, she is not a candidate for debulking and HIPEC, and her goal of care is palliation.  -Her [facility] genomic testing results were discussed with the patient. She has (+) KRAS mutation, not a candidate for EGFR inhibitor   -Her tumor has MSI stable, not a good candidate for immunotherapy alone  -She has started first-line palliative chemotherapy with 5-FU infusion and Avastin, tolerate treatment better w/o 5-fu bolus -We previously reviewed her staging CT scan from 04/17/2016, which showed partial response of her peritoneal metastasis, no other new lesions. -I previously reviewed her restaging CT scan image from 07/17/2016 with patient and her friend in person. She unfortunately has had disease progression in the peritoneal metastasis. -We previously discussed the option of switching her to second line chemotherapy, versus palliative care alone patient would like to try more chemotherapy.   -she is on second line irinotecan and Avastin  -due to her worsening diarrhea, I gave her chemo break after cycle 2, and will reduce Irinotecan dosage to 157m/m2 from cycle 3 -She has recovered well, lab reviewed, adequate for treatment, we'll proceed to cycle 4 chemotherapy and Avastin today. - I encouraged her to take more Lomotil as needed for her diarrhea. She can take up to 8 tablets a day. -Due to side effects from chemotherapy, I again discussed the pros and cons of changing treatment to every 3 weeks. She would like to change from this cycle. - repeat scan after next cycle chemo   2. Peripheral neuropathy, G 1 -From previous chemotherapy Xeloda, mild and stable now.  3. GERD -she'll continue Protonix.  4.Lung nodules  -She has two small right lung nodules which were discovered on the CT  scan in 09/2012,  RUL nodule slightly bigger on recent CT -She has been follow-up with Dr. Cyndia Bent.  -she never smoked, low risk for lung  cancer -given the stability of the RUL nodule over 2 years, this is likely a benign lesion   5. Abdominal Pain and diarrhea  -She has previously developed some abdominal pain, likely related to diarrhea and gas. Improved lately  -She will take tylenol for the pain -Previously prescribed Tramadol. Take PRN up to 3 times a day.  -We again reviewed the management of diarrhea, she knows to use Imodium and Lomotil as needed, she will use more to control her diarrhea.  6. HTN -Slightly elevated previously. No history of HTN, possible related to Avastin  -She will start monitoring and writing down her BP readings at home.  -continue meds   7. Hair Loss - Patient's hair is starting to thin, secondary to chemo  -OK to use biotin  8. Support/social issue -Her husband's dementia has been progressing and he has become aggressive and dangerous. -I have previously encouraged her to speak to our social worker.  -We previously discussed speaking to his doctor to get medication to help control his aggressive behavior.  -Her daughter would like to put him in a dementia facility.  9.Goal of care discussion  -We again discussed the incurable nature of her cancer, and the overall poor prognosis, especially if she does not have good response to chemotherapy or progress on chemo -The patient understands the goal of care is palliative. -I recommend DNR/DNI, she will think about it   Plan -  refill Lomotil and Compazine today -Lab review, adequate for treatment, we'll proceed cycle 4 Irinotecan today at previously reduced dose, continue Avastin  -Continue treatment, change to every 3 weeks from now to allow her to recover better. -I'll see her back in 3 weeks before her next cycle chemotherapy. I'll order restaging CT scan her next visit.  All questions were answered. The patient knows to call the clinic with any problems, questions or concerns.  I spent 20 minutes counseling the patient face to face.  The total time spent in the appointment was 25  minutes and more than 50% was on counseling.   I have reviewed the above documentation for accuracy and completeness, and I agree with the above information.      This document serves as a record of services personally performed by Heidi Merle, MD. It was created on her behalf by Brandt Loosen, a trained medical scribe. The creation of this record is based on the scribe's personal observations and the provider's statements to them. This document has been checked and approved by the attending provider.  Heidi Marquez 09/20/2016

## 2016-09-27 ENCOUNTER — Encounter (HOSPITAL_COMMUNITY): Payer: Self-pay | Admitting: Emergency Medicine

## 2016-09-27 ENCOUNTER — Telehealth: Payer: Self-pay

## 2016-09-27 ENCOUNTER — Emergency Department (HOSPITAL_COMMUNITY)
Admission: EM | Admit: 2016-09-27 | Discharge: 2016-09-27 | Disposition: A | Discharge status: Home / Self Care | Payer: PPO | Source: Home / Self Care | Attending: Emergency Medicine | Admitting: Emergency Medicine

## 2016-09-27 ENCOUNTER — Emergency Department (HOSPITAL_COMMUNITY): Payer: PPO

## 2016-09-27 DIAGNOSIS — D649 Anemia, unspecified: Secondary | ICD-10-CM | POA: Diagnosis not present

## 2016-09-27 DIAGNOSIS — K449 Diaphragmatic hernia without obstruction or gangrene: Secondary | ICD-10-CM | POA: Diagnosis not present

## 2016-09-27 DIAGNOSIS — K521 Toxic gastroenteritis and colitis: Secondary | ICD-10-CM | POA: Diagnosis not present

## 2016-09-27 DIAGNOSIS — D709 Neutropenia, unspecified: Secondary | ICD-10-CM | POA: Diagnosis not present

## 2016-09-27 DIAGNOSIS — E860 Dehydration: Secondary | ICD-10-CM | POA: Insufficient documentation

## 2016-09-27 DIAGNOSIS — W19XXXA Unspecified fall, initial encounter: Secondary | ICD-10-CM | POA: Insufficient documentation

## 2016-09-27 DIAGNOSIS — I2510 Atherosclerotic heart disease of native coronary artery without angina pectoris: Secondary | ICD-10-CM | POA: Insufficient documentation

## 2016-09-27 DIAGNOSIS — R030 Elevated blood-pressure reading, without diagnosis of hypertension: Secondary | ICD-10-CM | POA: Diagnosis not present

## 2016-09-27 DIAGNOSIS — R197 Diarrhea, unspecified: Secondary | ICD-10-CM | POA: Diagnosis not present

## 2016-09-27 DIAGNOSIS — Y939 Activity, unspecified: Secondary | ICD-10-CM

## 2016-09-27 DIAGNOSIS — Z85038 Personal history of other malignant neoplasm of large intestine: Secondary | ICD-10-CM | POA: Diagnosis not present

## 2016-09-27 DIAGNOSIS — E876 Hypokalemia: Secondary | ICD-10-CM | POA: Diagnosis not present

## 2016-09-27 DIAGNOSIS — T07XXXA Unspecified multiple injuries, initial encounter: Secondary | ICD-10-CM | POA: Insufficient documentation

## 2016-09-27 DIAGNOSIS — Y92009 Unspecified place in unspecified non-institutional (private) residence as the place of occurrence of the external cause: Secondary | ICD-10-CM | POA: Insufficient documentation

## 2016-09-27 DIAGNOSIS — M25531 Pain in right wrist: Secondary | ICD-10-CM | POA: Diagnosis not present

## 2016-09-27 DIAGNOSIS — E871 Hypo-osmolality and hyponatremia: Secondary | ICD-10-CM | POA: Diagnosis not present

## 2016-09-27 DIAGNOSIS — K8020 Calculus of gallbladder without cholecystitis without obstruction: Secondary | ICD-10-CM | POA: Diagnosis not present

## 2016-09-27 DIAGNOSIS — R5081 Fever presenting with conditions classified elsewhere: Secondary | ICD-10-CM | POA: Diagnosis not present

## 2016-09-27 DIAGNOSIS — K529 Noninfective gastroenteritis and colitis, unspecified: Secondary | ICD-10-CM | POA: Diagnosis not present

## 2016-09-27 DIAGNOSIS — R55 Syncope and collapse: Secondary | ICD-10-CM | POA: Insufficient documentation

## 2016-09-27 DIAGNOSIS — S0191XA Laceration without foreign body of unspecified part of head, initial encounter: Secondary | ICD-10-CM | POA: Diagnosis not present

## 2016-09-27 DIAGNOSIS — S4991XA Unspecified injury of right shoulder and upper arm, initial encounter: Secondary | ICD-10-CM | POA: Diagnosis not present

## 2016-09-27 DIAGNOSIS — K219 Gastro-esophageal reflux disease without esophagitis: Secondary | ICD-10-CM | POA: Diagnosis not present

## 2016-09-27 DIAGNOSIS — C785 Secondary malignant neoplasm of large intestine and rectum: Secondary | ICD-10-CM | POA: Diagnosis not present

## 2016-09-27 DIAGNOSIS — I712 Thoracic aortic aneurysm, without rupture: Secondary | ICD-10-CM | POA: Diagnosis not present

## 2016-09-27 DIAGNOSIS — D6481 Anemia due to antineoplastic chemotherapy: Secondary | ICD-10-CM | POA: Diagnosis not present

## 2016-09-27 DIAGNOSIS — M25511 Pain in right shoulder: Secondary | ICD-10-CM | POA: Diagnosis not present

## 2016-09-27 DIAGNOSIS — D701 Agranulocytosis secondary to cancer chemotherapy: Secondary | ICD-10-CM | POA: Diagnosis not present

## 2016-09-27 DIAGNOSIS — E875 Hyperkalemia: Secondary | ICD-10-CM | POA: Diagnosis not present

## 2016-09-27 DIAGNOSIS — S40011A Contusion of right shoulder, initial encounter: Secondary | ICD-10-CM | POA: Diagnosis not present

## 2016-09-27 DIAGNOSIS — S8001XA Contusion of right knee, initial encounter: Secondary | ICD-10-CM | POA: Diagnosis not present

## 2016-09-27 DIAGNOSIS — Z85828 Personal history of other malignant neoplasm of skin: Secondary | ICD-10-CM | POA: Diagnosis not present

## 2016-09-27 DIAGNOSIS — Z7983 Long term (current) use of bisphosphonates: Secondary | ICD-10-CM | POA: Diagnosis not present

## 2016-09-27 DIAGNOSIS — Z90710 Acquired absence of both cervix and uterus: Secondary | ICD-10-CM | POA: Diagnosis not present

## 2016-09-27 DIAGNOSIS — M25461 Effusion, right knee: Secondary | ICD-10-CM | POA: Diagnosis not present

## 2016-09-27 DIAGNOSIS — S60211A Contusion of right wrist, initial encounter: Secondary | ICD-10-CM | POA: Diagnosis not present

## 2016-09-27 DIAGNOSIS — H3530 Unspecified macular degeneration: Secondary | ICD-10-CM | POA: Diagnosis not present

## 2016-09-27 DIAGNOSIS — Z9049 Acquired absence of other specified parts of digestive tract: Secondary | ICD-10-CM | POA: Diagnosis not present

## 2016-09-27 DIAGNOSIS — S6991XA Unspecified injury of right wrist, hand and finger(s), initial encounter: Secondary | ICD-10-CM | POA: Diagnosis not present

## 2016-09-27 DIAGNOSIS — N179 Acute kidney failure, unspecified: Secondary | ICD-10-CM | POA: Diagnosis not present

## 2016-09-27 DIAGNOSIS — T451X5A Adverse effect of antineoplastic and immunosuppressive drugs, initial encounter: Secondary | ICD-10-CM | POA: Diagnosis not present

## 2016-09-27 DIAGNOSIS — C189 Malignant neoplasm of colon, unspecified: Secondary | ICD-10-CM | POA: Diagnosis not present

## 2016-09-27 DIAGNOSIS — Y999 Unspecified external cause status: Secondary | ICD-10-CM

## 2016-09-27 DIAGNOSIS — E440 Moderate protein-calorie malnutrition: Secondary | ICD-10-CM | POA: Diagnosis not present

## 2016-09-27 DIAGNOSIS — C786 Secondary malignant neoplasm of retroperitoneum and peritoneum: Secondary | ICD-10-CM | POA: Diagnosis not present

## 2016-09-27 DIAGNOSIS — Z9221 Personal history of antineoplastic chemotherapy: Secondary | ICD-10-CM | POA: Diagnosis not present

## 2016-09-27 DIAGNOSIS — R911 Solitary pulmonary nodule: Secondary | ICD-10-CM | POA: Diagnosis not present

## 2016-09-27 DIAGNOSIS — D638 Anemia in other chronic diseases classified elsewhere: Secondary | ICD-10-CM | POA: Diagnosis not present

## 2016-09-27 LAB — CBC WITH DIFFERENTIAL/PLATELET
Basophils Absolute: 0 10*3/uL (ref 0.0–0.1)
Basophils Relative: 2 %
Eosinophils Absolute: 0 10*3/uL (ref 0.0–0.7)
Eosinophils Relative: 4 %
HCT: 28.7 % — ABNORMAL LOW (ref 36.0–46.0)
Hemoglobin: 9.8 g/dL — ABNORMAL LOW (ref 12.0–15.0)
Lymphocytes Relative: 65 %
Lymphs Abs: 0.4 10*3/uL — ABNORMAL LOW (ref 0.7–4.0)
MCH: 30.6 pg (ref 26.0–34.0)
MCHC: 34.1 g/dL (ref 30.0–36.0)
MCV: 89.7 fL (ref 78.0–100.0)
Monocytes Absolute: 0 10*3/uL — ABNORMAL LOW (ref 0.1–1.0)
Monocytes Relative: 4 %
Neutro Abs: 0.1 10*3/uL — ABNORMAL LOW (ref 1.7–7.7)
Neutrophils Relative %: 25 %
Platelets: 87 10*3/uL — ABNORMAL LOW (ref 150–400)
RBC: 3.2 MIL/uL — ABNORMAL LOW (ref 3.87–5.11)
RDW: 13.3 % (ref 11.5–15.5)
WBC: 0.5 10*3/uL — CL (ref 4.0–10.5)

## 2016-09-27 LAB — BASIC METABOLIC PANEL
Anion gap: 10 (ref 5–15)
BUN: 31 mg/dL — ABNORMAL HIGH (ref 6–20)
CO2: 24 mmol/L (ref 22–32)
Calcium: 8.8 mg/dL — ABNORMAL LOW (ref 8.9–10.3)
Chloride: 103 mmol/L (ref 101–111)
Creatinine, Ser: 1.2 mg/dL — ABNORMAL HIGH (ref 0.44–1.00)
GFR calc Af Amer: 48 mL/min — ABNORMAL LOW (ref >60)
GFR calc non Af Amer: 42 mL/min — ABNORMAL LOW (ref >60)
Glucose, Bld: 104 mg/dL — ABNORMAL HIGH (ref 65–99)
Potassium: 3.7 mmol/L (ref 3.5–5.1)
Sodium: 137 mmol/L (ref 135–145)

## 2016-09-27 LAB — CK: Total CK: 836 U/L — ABNORMAL HIGH (ref 38–234)

## 2016-09-27 MED ORDER — SODIUM CHLORIDE 0.9 % IV BOLUS (SEPSIS)
1000.0000 mL | Freq: Once | INTRAVENOUS | Status: AC
  Administered 2016-09-27: 1000 mL via INTRAVENOUS

## 2016-09-27 NOTE — ED Triage Notes (Signed)
Patient here from home with complaints of fall. States that she has had diarrhea for the past couple of days. Went to restroom and fell. Reports that she was on the floor for 12 hours before help came. Reports right shoulder pain after fall. Cancer patient.

## 2016-09-27 NOTE — Telephone Encounter (Signed)
Daughter called. Pt has had diarrhea since Friday after chemo on Thursday. Too many to count. She drank about 3 bottles water per day. Appetite is decreased. She looks weak. Fell last night and laid on floor about 12 hours. Family found her this morning and have gotten her up, had a shower and had something to eat. Daughter said the stool looked almost gelatinous. She has a large bruise on her right shoulder, there is a scrape on her head they put ointment on it, r knee is sore and swollen (but is often swollen from arthritis) She says she thinks she knocked herself out when she fell. Was a little disoriented when daughter got there this am, she is much more with it now. She is moving fine, with pain in shoulder and knee. No head pain.   Her husband had massive stroke yesterday.   S/w Dr Burr Medico and sent pt to ER for evaluation for head trauma and dehydration. Instructed daughter that pt can use both imodium and lomotil up to maximum per day of 8 each.

## 2016-09-27 NOTE — ED Provider Notes (Signed)
[facility] DEPT Provider Note   By signing my name below, I, Bea Graff, attest that this documentation has been prepared under the direction and in the presence of Virgel Manifold, MD. Electronically Signed: Bea Graff, ED Scribe. 09/27/16. 2:24 PM.    History   Chief Complaint Chief Complaint  Patient presents with  . Fall  . Diarrhea  . Shoulder Pain   The history is provided by the patient and medical records. No language interpreter was used.    Heidi Marquez is a 83 y.o. female who presents to the Emergency Department complaining of a mechanical fall that occurred last night. She reports diarrhea for the past few days secondary to currently being in chemotherapy; her oncologist is Dr. Burr Medico. Pt states she was alone last night and was down on the floor lying on her right side with her head stuck between the cabinet for several hours after the fall. She reports hitting her head with positive LOC. She reports right shoulder pain and states she is having blurred vision from her right eye. She reports increased soreness from her baseline pain of the right knee. She has not taken anything for her symptoms. Moving the RUE increases the pain. She denies alleviating factors. She denies current pain, abdominal pain, nausea, vomiting. She is not on anticoagulant therapy.   Past Medical History:  Diagnosis Date  . Adenocarcinoma of colon [facility] oncologist-  dr Truitt Merle---  last chemo 01-17-2015 to 06-27-2015)   dx 05-17-2014 sigmoid (left) cancer, Stage 2, G1 (pT3N0M0), MSI stable -- s/p  sigmoidectomy /  dx 12-08-2014 ascending colon cancer (right) , Stage 3B, G2, (pT4aN1cM0), MSI stable -- s/p  right hemicolectomy/  08-2015  recurrent peritoneal carcinomatosis  . Arthritis    knees  . Coronary atherosclerosis   . Diverticulosis of colon   . GERD (gastroesophageal reflux disease)   . HH (hiatus hernia)   . History of basal cell carcinoma excision    right side nose 2013   s/p  moh's  . History of epistaxis    2005  caurterized twice  . History of esophageal dilatation    2009 for stricture  . History of esophagitis    reflux  . Internal hemorrhoids   . Macular degeneration of both eyes   . Multinodular thyroid    remote hx benign bx 2005  . Peritoneal carcinomatosis ([place])    secondary to primary colon cancer  . Pulmonary nodule, right    noted since 2014-- stable per last ct 06-15-2015  . Thoracic ascending aortic aneurysm [facility])    followed by dr Cyndia Bent ([facility])  last ct 06-15-2015  4.3cm    Patient Active Problem List   Diagnosis Date Noted  . Port catheter in place 02/08/2016  . Cancer of left colon ([place]) 02/21/2015  . Diarrhea 02/01/2015  . Hand foot syndrome 02/01/2015  . Anorexia 02/01/2015  . Mucositis due to antineoplastic therapy 02/01/2015  . Encounter for chemotherapy management 12/23/2014  . Cancer of right colon ([place]) 05/17/2014  . Internal hemorrhoids with complication - Grade 3 prolapse 04/07/2014  . Rectocele 04/07/2014  . Pelvic floor dysfunction suspected  04/07/2014  . Nodule of right lung 10/11/2012  . Diverticulosis of colon with hemorrhage 01/24/2012  . ESOPHAGEAL STRICTURE 12/29/2007  . GERD 12/29/2007    Past Surgical History:  Procedure Laterality Date  . COLONOSCOPY WITH PROPOFOL N/A 09/30/2014   Procedure: COLONOSCOPY WITH PROPOFOL;  Surgeon: Milus Banister, MD;  Location: [facility] ENDOSCOPY;  Service: Endoscopy;  Laterality: N/A;  . ESOPHAGOGASTRODUODENOSCOPY  2009  . INGUINAL HERNIA REPAIR Right early 1990s  . MOHS SURGERY  11/2011   nose  . PORTACATH PLACEMENT Right 01/24/2016   Procedure: INSERTION PORT-A-CATH;  Surgeon: Leighton Ruff, MD;  Location[facility];  Service: General;  Laterality: Right;  . ROBOTIC ASSISTED RIGHT HEMICOLECTOMY  34/19/6222    dr Leighton Ruff  . ROBOTIC ASSISTED SIGMOIDECTOMY  97/98/9211   dr Leighton Ruff  . UPPER GASTROINTESTINAL ENDOSCOPY    . VAGINAL HYSTERECTOMY   age 16   partial with the bladder tack    OB History    No data available       Home Medications    Prior to Admission medications   Medication Sig Start Date End Date Taking? Authorizing Provider  alendronate (FOSAMAX) 70 MG tablet Take 70 mg by mouth every 7 (seven) days. Sundays. Take with a full glass of water on an empty stomach.   Yes [provider]  amLODipine (NORVASC) 5 MG tablet Take 1 tablet (5 mg total) by mouth daily. 06/21/16  Yes Truitt Merle, MD  Ascorbic Acid (VITAMIN C) 1000 MG tablet Take 1,000 mg by mouth daily.   Yes [provider]  Biotin 1000 MCG tablet Take 1,000 mcg by mouth daily.    Yes [provider]  CALCIUM & MAGNESIUM CARBONATES PO Take 1 tablet by mouth daily.   Yes [provider]  cholecalciferol (VITAMIN D) 1000 UNITS tablet Take 1,000 Units by mouth daily.   Yes [provider]  diphenoxylate-atropine (LOMOTIL) 2.5-0.025 MG tablet Take 1-2 tablets by mouth 4 (four) times daily as needed for diarrhea or loose stools. 09/20/16  Yes Truitt Merle, MD  fluticasone (FLONASE) 50 MCG/ACT nasal spray Place 1 spray into both nostrils daily.   Yes [provider]  glucosamine-chondroitin 500-400 MG tablet Take 1 tablet by mouth daily.    Yes [provider]  Homeopathic Products ([employer] LEG CRAMPS PAIN RELIEF PO) Take 1 tablet by mouth daily as needed (for leg cramps).   Yes [provider]  ibuprofen (ADVIL,MOTRIN) 200 MG tablet Take 200 mg by mouth daily as needed for moderate pain.    Yes [provider]  lidocaine-prilocaine (EMLA) cream Apply to affected area once 01/12/16  Yes Truitt Merle, MD  loratadine (CLARITIN) 10 MG tablet Take 10 mg by mouth daily.   Yes [provider]  Multiple Vitamin (MULTIVITAMIN) tablet Take 1 tablet by mouth daily.   Yes [provider]  ondansetron (ZOFRAN) 8 MG tablet Take 1 tablet (8 mg total) by mouth every 8 (eight) hours as needed  (Nausea or vomiting). 01/12/16  Yes Truitt Merle, MD  potassium chloride SA (K-DUR,KLOR-CON) 20 MEQ tablet Take 1 tablet (20 mEq total) by mouth daily. 05/02/15  Yes Truitt Merle, MD  prochlorperazine (COMPAZINE) 10 MG tablet Take 1 tablet (10 mg total) by mouth every 8 (eight) hours as needed for nausea (Nausea or vomiting). 09/20/16  Yes Truitt Merle, MD  ranitidine (ZANTAC) 150 MG tablet Take 150 mg by mouth as needed for heartburn.   Yes [provider]  vitamin B-12 (CYANOCOBALAMIN) 1000 MCG tablet Take 1,000 mcg by mouth daily.    Yes [provider]  traMADol (ULTRAM) 50 MG tablet Take 1 tablet (50 mg total) by mouth every 6 (six) hours as needed. Patient not taking: Reported on 09/20/2016 03/22/16   Truitt Merle, MD    Family History Family History  Problem Relation Age  of Onset  . Colon cancer Paternal Grandfather 53       colon cancer   . Stroke Father   . Heart attack Son 81       died of heart attack   . Esophageal cancer Neg Hx   . Rectal cancer Neg Hx   . Stomach cancer Neg Hx     Social History Social History  Substance Use Topics  . Smoking status: Never Smoker  . Smokeless tobacco: Never Used  . Alcohol use No     Allergies   Sulfa antibiotics; Amoxicillin-pot clavulanate; and Penicillins   Review of Systems Review of Systems All other systems reviewed and are negative for acute change except as noted in the HPI.   Physical Exam Updated Vital Signs BP 139/85   Pulse 98   Resp 12   SpO2 100%   Physical Exam  Constitutional: She is oriented to person, place, and time. She appears well-developed and well-nourished. No distress.  HENT:  Head: Normocephalic.  Mild swelling and erythema around right eye.  Eyes: EOM are normal.  Neck: Normal range of motion.  No midline cervical tenderness.  Cardiovascular: Normal rate, regular rhythm and normal heart sounds.   Pulmonary/Chest: Effort normal and breath sounds normal.  Abdominal: Soft. She exhibits no  distension. There is no tenderness.  Musculoskeletal: Normal range of motion. She exhibits tenderness.  Erythema and ecchymosis to right shoulder, right wrist and right lateral knee. Can actively range those joints without apparent discomfort.  Neurological: She is alert and oriented to person, place, and time. No cranial nerve deficit.  Strength 5/5 in BUE and BLE.  Skin: Skin is warm and dry.  Psychiatric: She has a normal mood and affect. Judgment normal.  Nursing note and vitals reviewed.    ED Treatments / Results  DIAGNOSTIC STUDIES: Oxygen Saturation is 100% on RA, normal by my interpretation.   COORDINATION OF CARE: 12:24 PM- Will order imaging and labs. Offered pain medication but pt declined stating she is not currently in any pain. Pt verbalizes understanding and agrees to plan.  Medications  sodium chloride 0.9 % bolus 1,000 mL (1,000 mLs Intravenous New Bag/Given 09/27/16 1347)    Labs (all labs ordered are listed, but only abnormal results are displayed) Labs Reviewed  BASIC METABOLIC PANEL - Abnormal; Notable for the following:       Result Value   Glucose, Bld 104 (*)    BUN 31 (*)    Creatinine, Ser 1.20 (*)    Calcium 8.8 (*)    GFR calc non Af Amer 42 (*)    GFR calc Af Amer 48 (*)    All other components within normal limits  CK - Abnormal; Notable for the following:    Total CK 836 (*)    All other components within normal limits  CBC WITH DIFFERENTIAL/PLATELET    EKG  EKG Interpretation None       Radiology Dg Shoulder Right  Result Date: 09/27/2016 CLINICAL DATA:  Pain following fall EXAM: RIGHT SHOULDER - 2+ VIEW COMPARISON:  None. FINDINGS: Frontal, oblique, and Y scapular images were obtained. There is no acute fracture or dislocation. A focus of calcification in the glenohumeral joint may represent prior age uncertain avulsion along the glenoid labrum. There is moderate generalized osteoarthritis. No erosive change. There is bony overgrowth  along the inferior distal clavicle. There is mild superior migration of the humeral head. Port-A-Cath tip is in superior vena cava. Visualized right lung is  clear. IMPRESSION: No acute fracture or dislocation. Calcification in the glenohumeral joint may represent age uncertain avulsion from the glenoid labrum. Moderate generalized osteoarthritic change. Superior migration of the humeral head suggests chronic rotator cuff tear. Bony overgrowth along the lateral distal clavicle may lead to so-called impingement syndrome. Electronically Signed   By: Lowella Grip III M.D.   On: 09/27/2016 13:34   Dg Wrist Complete Right  Result Date: 09/27/2016 CLINICAL DATA:  Pain following fall EXAM: RIGHT WRIST - COMPLETE 3+ VIEW COMPARISON:  None. FINDINGS: Frontal, oblique, lateral, and ulnar deviation scaphoid images were obtained. There is no appreciable acute fracture or dislocation. A small focus of calcification is noted in the triangular fibrocartilage region, likely residua of prior trauma. There is osteoarthritic change in the scaphotrapezial and first carpal -metacarpal joints. Other joint spaces appear unremarkable. No erosive change. Pronator quadratus fat pad is not elevated. IMPRESSION: No evident acute fracture or dislocation. Probable old trauma in the triangular fibrocartilage region with focal calcification. Osteoarthritic change noted in the scaphotrapezial and first carpal -metacarpal joints. Electronically Signed   By: Lowella Grip III M.D.   On: 09/27/2016 13:35   Ct Head Wo Contrast  Result Date: 09/27/2016 CLINICAL DATA:  [place] yesterday hitting the back of the head with laceration, syncope, confusion, history of carcinoma of the colon EXAM: CT HEAD WITHOUT CONTRAST TECHNIQUE: Contiguous axial images were obtained from the base of the skull through the vertex without intravenous contrast. COMPARISON:  CT brain scan of 08/05/2007 FINDINGS: Brain: The ventricular system remains slightly  prominent as are the cortical sulci indicating mild atrophy. The septum is midline in position. There is mild small vessel ischemic change throughout the periventricular white matter. No hemorrhage, mass lesion, or acute infarction is seen. Vascular: No vascular abnormality is seen on this unenhanced study. Skull: On bone window images no calvarial abnormality is seen. Sinuses/Orbits: There is mild mucosal thickening in the floor of the right maxillary sinus. No present evidence of sinusitis is seen. Other: None. IMPRESSION: 1. Mild small vessel ischemic change and atrophy. No acute intracranial abnormality. 2. Mild mucosal thickening in the floor of the right maxillary sinus. Electronically Signed   By: Ivar Drape M.D.   On: 09/27/2016 12:58   Dg Knee Complete 4 Views Right  Result Date: 09/27/2016 CLINICAL DATA:  Pain following fall EXAM: RIGHT KNEE - COMPLETE 4+ VIEW COMPARISON:  None. FINDINGS: Frontal, lateral common bile oblique views were obtained. There is no evident fracture or dislocation. There is a small joint effusion. There is moderately severe generalized joint space narrowing with spurring in all compartments. No erosive change. IMPRESSION: Extensive generalized osteoarthritic change. Small joint effusion. No evident fracture or dislocation. Electronically Signed   By: Lowella Grip III M.D.   On: 09/27/2016 13:36    Procedures Procedures (including critical care time)  Medications Ordered in ED Medications  sodium chloride 0.9 % bolus 1,000 mL (1,000 mLs Intravenous New Bag/Given 09/27/16 1347)     Initial Impression / Assessment and Plan / ED Course  I have reviewed the triage vital signs and the nursing notes.  Pertinent labs & imaging results that were available during my care of the patient were reviewed by me and considered in my medical decision making (see chart for details).     83 year old female presenting after fall. Sounds mechanical. Imaging negative for acute  abnormality. CK is only minimally elevated. She was hydrated. She is leukopenic. She is currently undergoing chemotherapy for cancer. She is  afebrile. I do not think this is directly contributory.   Final Clinical Impressions(s) / ED Diagnoses   Final diagnoses:  Fall, initial encounter  Dehydration  Multiple contusions    New Prescriptions New Prescriptions   No medications on file     I personally preformed the services scribed in my presence. The recorded information has been reviewed is accurate. Virgel Manifold, MD.      Virgel Manifold, MD 10/12/16 1007

## 2016-09-27 NOTE — ED Notes (Signed)
ED Provider at bedside. 

## 2016-09-27 NOTE — ED Notes (Signed)
XRAY WAS RIGHT KNEE NOT LEFT KNEE

## 2016-09-30 ENCOUNTER — Encounter (HOSPITAL_COMMUNITY): Payer: Self-pay | Admitting: Emergency Medicine

## 2016-09-30 ENCOUNTER — Emergency Department (HOSPITAL_COMMUNITY): Payer: PPO

## 2016-09-30 ENCOUNTER — Other Ambulatory Visit: Payer: Self-pay

## 2016-09-30 ENCOUNTER — Inpatient Hospital Stay (HOSPITAL_COMMUNITY)
Admission: EM | Admit: 2016-09-30 | Discharge: 2016-10-04 | DRG: 392 | Disposition: A | Discharge status: Home / Self Care | Payer: PPO | Attending: Internal Medicine | Admitting: Internal Medicine

## 2016-09-30 DIAGNOSIS — E871 Hypo-osmolality and hyponatremia: Secondary | ICD-10-CM | POA: Diagnosis present

## 2016-09-30 DIAGNOSIS — D638 Anemia in other chronic diseases classified elsewhere: Secondary | ICD-10-CM | POA: Diagnosis present

## 2016-09-30 DIAGNOSIS — Z8249 Family history of ischemic heart disease and other diseases of the circulatory system: Secondary | ICD-10-CM

## 2016-09-30 DIAGNOSIS — E860 Dehydration: Secondary | ICD-10-CM | POA: Diagnosis present

## 2016-09-30 DIAGNOSIS — E440 Moderate protein-calorie malnutrition: Secondary | ICD-10-CM | POA: Diagnosis not present

## 2016-09-30 DIAGNOSIS — E876 Hypokalemia: Secondary | ICD-10-CM | POA: Diagnosis present

## 2016-09-30 DIAGNOSIS — D701 Agranulocytosis secondary to cancer chemotherapy: Secondary | ICD-10-CM | POA: Diagnosis present

## 2016-09-30 DIAGNOSIS — C189 Malignant neoplasm of colon, unspecified: Secondary | ICD-10-CM | POA: Diagnosis not present

## 2016-09-30 DIAGNOSIS — R5081 Fever presenting with conditions classified elsewhere: Secondary | ICD-10-CM | POA: Diagnosis present

## 2016-09-30 DIAGNOSIS — K219 Gastro-esophageal reflux disease without esophagitis: Secondary | ICD-10-CM | POA: Diagnosis present

## 2016-09-30 DIAGNOSIS — I2510 Atherosclerotic heart disease of native coronary artery without angina pectoris: Secondary | ICD-10-CM | POA: Diagnosis present

## 2016-09-30 DIAGNOSIS — Z85038 Personal history of other malignant neoplasm of large intestine: Secondary | ICD-10-CM

## 2016-09-30 DIAGNOSIS — Z823 Family history of stroke: Secondary | ICD-10-CM

## 2016-09-30 DIAGNOSIS — Z880 Allergy status to penicillin: Secondary | ICD-10-CM

## 2016-09-30 DIAGNOSIS — I712 Thoracic aortic aneurysm, without rupture: Secondary | ICD-10-CM | POA: Diagnosis present

## 2016-09-30 DIAGNOSIS — R911 Solitary pulmonary nodule: Secondary | ICD-10-CM | POA: Diagnosis present

## 2016-09-30 DIAGNOSIS — Z881 Allergy status to other antibiotic agents status: Secondary | ICD-10-CM

## 2016-09-30 DIAGNOSIS — E875 Hyperkalemia: Secondary | ICD-10-CM | POA: Diagnosis present

## 2016-09-30 DIAGNOSIS — N179 Acute kidney failure, unspecified: Secondary | ICD-10-CM | POA: Diagnosis present

## 2016-09-30 DIAGNOSIS — Z9221 Personal history of antineoplastic chemotherapy: Secondary | ICD-10-CM

## 2016-09-30 DIAGNOSIS — Z90710 Acquired absence of both cervix and uterus: Secondary | ICD-10-CM

## 2016-09-30 DIAGNOSIS — Z9049 Acquired absence of other specified parts of digestive tract: Secondary | ICD-10-CM

## 2016-09-30 DIAGNOSIS — R197 Diarrhea, unspecified: Secondary | ICD-10-CM

## 2016-09-30 DIAGNOSIS — Z7983 Long term (current) use of bisphosphonates: Secondary | ICD-10-CM | POA: Diagnosis not present

## 2016-09-30 DIAGNOSIS — Z85828 Personal history of other malignant neoplasm of skin: Secondary | ICD-10-CM | POA: Diagnosis not present

## 2016-09-30 DIAGNOSIS — K521 Toxic gastroenteritis and colitis: Secondary | ICD-10-CM | POA: Diagnosis not present

## 2016-09-30 DIAGNOSIS — Z800 Family history of malignant neoplasm of digestive organs: Secondary | ICD-10-CM

## 2016-09-30 DIAGNOSIS — C785 Secondary malignant neoplasm of large intestine and rectum: Secondary | ICD-10-CM | POA: Diagnosis not present

## 2016-09-30 DIAGNOSIS — K529 Noninfective gastroenteritis and colitis, unspecified: Secondary | ICD-10-CM | POA: Diagnosis present

## 2016-09-30 DIAGNOSIS — T451X5A Adverse effect of antineoplastic and immunosuppressive drugs, initial encounter: Secondary | ICD-10-CM | POA: Diagnosis not present

## 2016-09-30 DIAGNOSIS — C786 Secondary malignant neoplasm of retroperitoneum and peritoneum: Secondary | ICD-10-CM | POA: Diagnosis present

## 2016-09-30 DIAGNOSIS — K449 Diaphragmatic hernia without obstruction or gangrene: Secondary | ICD-10-CM | POA: Diagnosis present

## 2016-09-30 DIAGNOSIS — R030 Elevated blood-pressure reading, without diagnosis of hypertension: Secondary | ICD-10-CM | POA: Diagnosis present

## 2016-09-30 DIAGNOSIS — D6481 Anemia due to antineoplastic chemotherapy: Secondary | ICD-10-CM | POA: Diagnosis present

## 2016-09-30 DIAGNOSIS — H3530 Unspecified macular degeneration: Secondary | ICD-10-CM | POA: Diagnosis present

## 2016-09-30 DIAGNOSIS — D709 Neutropenia, unspecified: Secondary | ICD-10-CM | POA: Diagnosis not present

## 2016-09-30 DIAGNOSIS — D649 Anemia, unspecified: Secondary | ICD-10-CM | POA: Diagnosis not present

## 2016-09-30 DIAGNOSIS — Z79899 Other long term (current) drug therapy: Secondary | ICD-10-CM

## 2016-09-30 LAB — DIFFERENTIAL
Band Neutrophils: 0 %
Basophils Absolute: 0 K/uL (ref 0.0–0.1)
Basophils Relative: 1 %
Blasts: 0 %
Eosinophils Absolute: 0 K/uL (ref 0.0–0.7)
Eosinophils Relative: 3 %
Lymphocytes Relative: 32 %
Lymphs Abs: 0.5 K/uL — ABNORMAL LOW (ref 0.7–4.0)
Metamyelocytes Relative: 0 %
Monocytes Absolute: 0.4 K/uL (ref 0.1–1.0)
Monocytes Relative: 36 %
Myelocytes: 0 %
Neutro Abs: 0.3 K/uL — ABNORMAL LOW (ref 1.7–7.7)
Neutrophils Relative %: 28 %
Other: 0 %
Promyelocytes Absolute: 0 %
nRBC: 0 /100{WBCs}

## 2016-09-30 LAB — COMPREHENSIVE METABOLIC PANEL
ALK PHOS: 103 U/L (ref 38–126)
ALT: 26 U/L (ref 14–54)
ANION GAP: 10 (ref 5–15)
AST: 30 U/L (ref 15–41)
Albumin: 3.2 g/dL — ABNORMAL LOW (ref 3.5–5.0)
BILIRUBIN TOTAL: 1.1 mg/dL (ref 0.3–1.2)
BUN: 20 mg/dL (ref 6–20)
CALCIUM: 8.2 mg/dL — AB (ref 8.9–10.3)
CO2: 22 mmol/L (ref 22–32)
Chloride: 101 mmol/L (ref 101–111)
Creatinine, Ser: 1.29 mg/dL — ABNORMAL HIGH (ref 0.44–1.00)
GFR calc non Af Amer: 38 mL/min — ABNORMAL LOW (ref >60)
GFR, EST AFRICAN AMERICAN: 44 mL/min — AB (ref >60)
Glucose, Bld: 121 mg/dL — ABNORMAL HIGH (ref 65–99)
Potassium: 2.8 mmol/L — ABNORMAL LOW (ref 3.5–5.1)
Sodium: 133 mmol/L — ABNORMAL LOW (ref 135–145)
TOTAL PROTEIN: 6.3 g/dL — AB (ref 6.5–8.1)

## 2016-09-30 LAB — I-STAT CG4 LACTIC ACID, ED: Lactic Acid, Venous: 1.4 mmol/L (ref 0.5–1.9)

## 2016-09-30 LAB — MAGNESIUM: Magnesium: 1.4 mg/dL — ABNORMAL LOW (ref 1.7–2.4)

## 2016-09-30 LAB — C DIFFICILE QUICK SCREEN W PCR REFLEX
C DIFFICILE (CDIFF) INTERP: NOT DETECTED
C Diff antigen: NEGATIVE
C Diff toxin: NEGATIVE

## 2016-09-30 LAB — LIPASE, BLOOD: Lipase: 24 U/L (ref 11–51)

## 2016-09-30 MED ORDER — POTASSIUM CHLORIDE IN NACL 40-0.9 MEQ/L-% IV SOLN
INTRAVENOUS | Status: DC
Start: 2016-09-30 — End: 2016-10-02
  Administered 2016-09-30 – 2016-10-02 (×5): 125 mL/h via INTRAVENOUS
  Filled 2016-09-30 (×5): qty 1000

## 2016-09-30 MED ORDER — BIOTIN 1000 MCG PO TABS: 1000.0000 ug | ORAL_TABLET | Freq: Every day | ORAL | Status: DC

## 2016-09-30 MED ORDER — LORATADINE 10 MG PO TABS
10.0000 mg | ORAL_TABLET | Freq: Every day | ORAL | Status: DC
  Administered 2016-10-01 – 2016-10-04 (×4): 10 mg via ORAL
  Filled 2016-09-30 (×4): qty 1

## 2016-09-30 MED ORDER — FLUTICASONE PROPIONATE 50 MCG/ACT NA SUSP
1.0000 | Freq: Every day | NASAL | Status: DC
  Administered 2016-10-01 – 2016-10-04 (×4): 1 via NASAL
  Filled 2016-09-30: qty 16

## 2016-09-30 MED ORDER — SODIUM CHLORIDE 0.9 % IV BOLUS (SEPSIS)
1000.0000 mL | Freq: Once | INTRAVENOUS | Status: AC
  Administered 2016-09-30: 1000 mL via INTRAVENOUS

## 2016-09-30 MED ORDER — DIPHENOXYLATE-ATROPINE 2.5-0.025 MG PO TABS
2.0000 | ORAL_TABLET | Freq: Once | ORAL | Status: AC
  Administered 2016-09-30: 2 via ORAL
  Filled 2016-09-30: qty 2

## 2016-09-30 MED ORDER — VANCOMYCIN HCL IN DEXTROSE 1-5 GM/200ML-% IV SOLN
1000.0000 mg | Freq: Once | INTRAVENOUS | Status: AC
Start: 2016-09-30 — End: 2016-09-30
  Administered 2016-09-30: 1000 mg via INTRAVENOUS
  Filled 2016-09-30: qty 200

## 2016-09-30 MED ORDER — DEXTROSE 5 % IV SOLN
2.0000 g | Freq: Once | INTRAVENOUS | Status: AC
  Administered 2016-09-30: 2 g via INTRAVENOUS
  Filled 2016-09-30: qty 2

## 2016-09-30 MED ORDER — ENOXAPARIN SODIUM 30 MG/0.3ML ~~LOC~~ SOLN: 30.0000 mg | SUBCUTANEOUS | Status: DC

## 2016-09-30 MED ORDER — VITAMIN D3 25 MCG (1000 UNIT) PO TABS
1000.0000 [IU] | ORAL_TABLET | Freq: Every day | ORAL | Status: DC
  Administered 2016-10-01 – 2016-10-04 (×4): 1000 [IU] via ORAL
  Filled 2016-09-30 (×4): qty 1

## 2016-09-30 MED ORDER — SODIUM CHLORIDE 0.9 % IV SOLN
500.0000 mg | INTRAVENOUS | Status: DC
  Filled 2016-09-30: qty 500

## 2016-09-30 MED ORDER — AMLODIPINE BESYLATE 5 MG PO TABS
5.0000 mg | ORAL_TABLET | Freq: Every day | ORAL | Status: DC
  Administered 2016-09-30 – 2016-10-04 (×5): 5 mg via ORAL
  Filled 2016-09-30 (×5): qty 1

## 2016-09-30 MED ORDER — POTASSIUM CHLORIDE CRYS ER 20 MEQ PO TBCR
40.0000 meq | EXTENDED_RELEASE_TABLET | Freq: Four times a day (QID) | ORAL | Status: AC
  Administered 2016-09-30 – 2016-10-01 (×4): 40 meq via ORAL
  Filled 2016-09-30 (×4): qty 2

## 2016-09-30 MED ORDER — VITAMIN B-12 1000 MCG PO TABS
1000.0000 ug | ORAL_TABLET | Freq: Every day | ORAL | Status: DC
  Administered 2016-10-01 – 2016-10-04 (×4): 1000 ug via ORAL
  Filled 2016-09-30 (×4): qty 1

## 2016-09-30 MED ORDER — POTASSIUM CHLORIDE CRYS ER 20 MEQ PO TBCR
40.0000 meq | EXTENDED_RELEASE_TABLET | Freq: Once | ORAL | Status: AC
  Administered 2016-09-30: 40 meq via ORAL
  Filled 2016-09-30: qty 2

## 2016-09-30 MED ORDER — CEFEPIME HCL 2 G IJ SOLR
2.0000 g | Freq: Two times a day (BID) | INTRAMUSCULAR | Status: DC
  Administered 2016-10-01 – 2016-10-04 (×7): 2 g via INTRAVENOUS
  Filled 2016-09-30 (×7): qty 2

## 2016-09-30 NOTE — ED Triage Notes (Signed)
Pt c/o diarrhea x several days. Pt states she has decreased appetite and is feeling weak. Fevers at home 100.8, no fever in triage, patient states she has not taken any meds at home for fever.

## 2016-09-30 NOTE — Progress Notes (Signed)
Pharmacy Antibiotic Note  Heidi Marquez is a 83 y.o. female admitted on 09/30/2016 with febrile neutropenia.  Pharmacy has been consulted for Vancomycin and Cefepime dosing.  Plan:  Vancomycin 1000 mg IV given already x 1, then 500 mg IV q24 hr; goal trough 15-20 mcg/mL  Measure vancomycin trough levels at steady state as indicated  Cefepime 2 g IV q12 hr   Height: 5\' 4"  (162.6 cm) Weight: 128 lb 11.2 oz (58.4 kg) IBW/kg (Calculated) : 54.7  Temp (24hrs), Avg:98.8 F (37.1 C), Min:98.4 F (36.9 C), Max:99.5 F (37.5 C)   Recent Labs Lab 09/27/16 1338 09/30/16 1250 09/30/16 1300  WBC 0.5*  --  1.2*  CREATININE 1.20*  --  1.29*  LATICACIDVEN  --  1.40  --     Estimated Creatinine Clearance: 30 mL/min (A) (by C-G formula based on SCr of 1.29 mg/dL (H)).    Allergies  Allergen Reactions  . Sulfa Antibiotics Swelling    Eyes,mouth  . Amoxicillin-Pot Clavulanate Rash  . Penicillins Swelling and Rash    Has patient had a PCN reaction causing immediate rash, facial/tongue/throat swelling, SOB or lightheadedness with hypotension: Yes Has patient had a PCN reaction causing severe rash involving mucus membranes or skin necrosis: Unknown Has patient had a PCN reaction that required hospitalization: No Has patient had a PCN reaction occurring within the last 10 years: No If all of the above answers are "NO", then may proceed with Cephalosporin use.     Antimicrobials this admission:  8/19 Vanc x 1 8/19 Cefepime x 1  Dose adjustments this admission:  ---  Microbiology results:  8/19 BCx: sent 8/19 UCx: sent 8/19 C.diff: 8/19 GI panel:  Thank you for allowing pharmacy to be a part of this patient's care.  Reuel Boom, PharmD, BCPS Pager: [phone] 09/30/2016, 6:00 PM

## 2016-09-30 NOTE — H&P (Signed)
History and Physical    Heidi Marquez DOB: [birth year] DOA: 09/30/2016  PCP: Prince Solian, MD  Patient coming from: home  I have personally briefly reviewed patient's old medical records in [place]  Chief Complaint: fever and diarrhea.  HPI: Heidi Marquez is a 83 y.o. female with medical history significant of colon cancer admitted with fever of 100.8 at home along with diarrhea. Patient is currently on chemotherapy for colon cancer. ER doctor spoke to Dr. Malachy Mood with oncology. She denies any cough chest pain shortness of breath. She took Imodium and Lomotil at home without any relief. She denies any dysuria frequency of urination. She has a port on the right upper chest. Denies any pain or redness to the port site  ED Course: She received vancomycin and cefepime and she was started on IV fluids. ER contacted the oncologist and oncologist will see her tomorrow.  Review of Systems: As per HPI otherwise 10 point review of systems negative.   Past Medical History:  Diagnosis Date  . Adenocarcinoma of colon [facility]) oncologist-  dr Truitt Merle---  last chemo 01-17-2015 to 06-27-2015)   dx 05-17-2014 sigmoid (left) cancer, Stage 2, G1 (pT3N0M0), MSI stable -- s/p  sigmoidectomy /  dx 12-08-2014 ascending colon cancer (right) , Stage 3B, G2, (pT4aN1cM0), MSI stable -- s/p  right hemicolectomy/  08-2015  recurrent peritoneal carcinomatosis  . Arthritis    knees  . Coronary atherosclerosis   . Diverticulosis of colon   . GERD (gastroesophageal reflux disease)   . HH (hiatus hernia)   . History of basal cell carcinoma excision    right side nose 2013  s/p  moh's  . History of epistaxis    2005  caurterized twice  . History of esophageal dilatation    2009 for stricture  . History of esophagitis    reflux  . Internal hemorrhoids   . Macular degeneration of both eyes   . Multinodular thyroid    remote hx benign bx 2005  . Peritoneal carcinomatosis ([place])    secondary to primary colon cancer  . Pulmonary nodule, right    noted since 2014-- stable per last ct 06-15-2015  . Thoracic ascending aortic aneurysm [facility])    followed by dr Cyndia Bent ([facility])  last ct 06-15-2015  4.3cm    Past Surgical History:  Procedure Laterality Date  . COLONOSCOPY WITH PROPOFOL N/A 09/30/2014   Procedure: COLONOSCOPY WITH PROPOFOL;  Surgeon: Milus Banister, MD;  Location: [facility];  Service: Endoscopy;  Laterality: N/A;  . ESOPHAGOGASTRODUODENOSCOPY  2009  . INGUINAL HERNIA REPAIR Right early 1990s  . MOHS SURGERY  11/2011   nose  . PORTACATH PLACEMENT Right 01/24/2016   Procedure: INSERTION PORT-A-CATH;  Surgeon: Leighton Ruff, MD;  Location: [facility];  Service: General;  Laterality: Right;  . ROBOTIC ASSISTED RIGHT HEMICOLECTOMY  81/19/1478    dr Leighton Ruff  . ROBOTIC ASSISTED SIGMOIDECTOMY  29/56/2130   dr Leighton Ruff  . UPPER GASTROINTESTINAL ENDOSCOPY    . VAGINAL HYSTERECTOMY  age 71   partial with the bladder tack     reports that she has never smoked. She has never used smokeless tobacco. She reports that she does not drink alcohol or use drugs.  Allergies  Allergen Reactions  . Sulfa Antibiotics Swelling    Eyes,mouth  . Amoxicillin-Pot Clavulanate Rash  . Penicillins Swelling and Rash    Has patient had a PCN reaction causing immediate rash, facial/tongue/throat swelling, SOB or  lightheadedness with hypotension: Yes Has patient had a PCN reaction causing severe rash involving mucus membranes or skin necrosis: Unknown Has patient had a PCN reaction that required hospitalization: No Has patient had a PCN reaction occurring within the last 10 years: No If all of the above answers are "NO", then may proceed with Cephalosporin use.     Family History  Problem Relation Age of Onset  . Colon cancer Paternal Grandfather 35       colon cancer   . Stroke Father   . Heart attack Son 36       died of heart attack   .  Esophageal cancer Neg Hx   . Rectal cancer Neg Hx   . Stomach cancer Neg Hx    Unacceptable: Noncontributory, unremarkable, or negative. Acceptable: Family history reviewed and not pertinent (If you reviewed it)  Prior to Admission medications   Medication Sig Start Date End Date Taking? Authorizing Provider  alendronate (FOSAMAX) 70 MG tablet Take 70 mg by mouth every 7 (seven) days. Sundays. Take with a full glass of water on an empty stomach.   Yes [provider]  amLODipine (NORVASC) 5 MG tablet Take 1 tablet (5 mg total) by mouth daily. 06/21/16  Yes Truitt Merle, MD  Ascorbic Acid (VITAMIN C) 1000 MG tablet Take 1,000 mg by mouth daily.   Yes [provider]  Biotin 1000 MCG tablet Take 1,000 mcg by mouth daily.    Yes [provider]  CALCIUM & MAGNESIUM CARBONATES PO Take 1 tablet by mouth daily.   Yes [provider]  cholecalciferol (VITAMIN D) 1000 UNITS tablet Take 1,000 Units by mouth daily.   Yes [provider]  diphenoxylate-atropine (LOMOTIL) 2.5-0.025 MG tablet Take 1-2 tablets by mouth 4 (four) times daily as needed for diarrhea or loose stools. 09/20/16  Yes Truitt Merle, MD  fluticasone (FLONASE) 50 MCG/ACT nasal spray Place 1 spray into both nostrils daily.   Yes [provider]  glucosamine-chondroitin 500-400 MG tablet Take 1 tablet by mouth daily.    Yes [provider]  Homeopathic Products ([employer] LEG CRAMPS PAIN RELIEF PO) Take 1 tablet by mouth daily as needed (for leg cramps).   Yes [provider]  ibuprofen (ADVIL,MOTRIN) 200 MG tablet Take 200 mg by mouth daily as needed for moderate pain.    Yes [provider]  lidocaine-prilocaine (EMLA) cream Apply to affected area once 01/12/16  Yes Truitt Merle, MD  loratadine (CLARITIN) 10 MG tablet Take 10 mg by mouth daily.   Yes [provider]  Multiple Vitamin (MULTIVITAMIN) tablet Take 1 tablet by mouth daily.   Yes [provider]    ondansetron (ZOFRAN) 8 MG tablet Take 1 tablet (8 mg total) by mouth every 8 (eight) hours as needed (Nausea or vomiting). 01/12/16  Yes Truitt Merle, MD  potassium chloride SA (K-DUR,KLOR-CON) 20 MEQ tablet Take 1 tablet (20 mEq total) by mouth daily. 05/02/15  Yes Truitt Merle, MD  ranitidine (ZANTAC) 150 MG tablet Take 150 mg by mouth as needed for heartburn.   Yes [provider]  vitamin B-12 (CYANOCOBALAMIN) 1000 MCG tablet Take 1,000 mcg by mouth daily.    Yes [provider]  prochlorperazine (COMPAZINE) 10 MG tablet Take 1 tablet (10 mg total) by mouth every 8 (eight) hours as needed for nausea (Nausea or vomiting). 09/20/16   Truitt Merle, MD    Physical Exam: Vitals:   09/30/16 1201 09/30/16 1456 09/30/16 1500 09/30/16 1530  BP: 119/76  138/65 (!) 145/129  Pulse: (!) 104     Resp: 20  (!) 26 19  Temp: 98.4 F (36.9 C) 98.4 F (36.9 C)    TempSrc: Oral Oral    SpO2: 100%       Constitutional: NAD, calm, comfortable Vitals:   09/30/16 1201 09/30/16 1456 09/30/16 1500 09/30/16 1530  BP: 119/76  138/65 (!) 145/129  Pulse: (!) 104     Resp: 20  (!) 26 19  Temp: 98.4 F (36.9 C) 98.4 F (36.9 C)    TempSrc: Oral Oral    SpO2: 100%      Eyes: PERRL, lids and conjunctivae normal ENMT: Mucous membranes aredry. Posterior pharynx clear of any exudate or lesions.Normal dentition.  Neck: normal, supple, no masses, no thyromegaly Respiratory: clear to auscultation bilaterally, no wheezing, no crackles. Normal respiratory effort. No accessory muscle use.  Cardiovascular: Regular rate and rhythm, no murmurs / rubs / gallops. No extremity edema. 2+ pedal pulses. No carotid bruits.  Abdomen: no tenderness, no masses palpated. No hepatosplenomegaly. Bowel sounds positive.  Musculoskeletal: no clubbing / cyanosis. No joint deformity upper and lower extremities. Good ROM, no contractures. Normal muscle tone.  Skin: no rashes, lesions, ulcers. No induration Neurologic: CN 2-12  grossly intact. Sensation intact, DTR normal. Strength 5/5 in all 4.  Psychiatric: Normal judgment and insight. Alert and oriented x 3. Normal mood.    Labs on Admission: I have personally reviewed following labs and imaging studies  CBC:  Recent Labs Lab 09/27/16 1338 09/30/16 1300  WBC 0.5* 1.2*  NEUTROABS 0.1* 0.3*  HGB 9.8* 9.4*  HCT 28.7* 27.1*  MCV 89.7 88.0  PLT 87* 944   Basic Metabolic Panel:  Recent Labs Lab 09/27/16 1338 09/30/16 1300 09/30/16 1315  NA 137 133*  --   K 3.7 2.8*  --   CL 103 101  --   CO2 24 22  --   GLUCOSE 104* 121*  --   BUN 31* 20  --   CREATININE 1.20* 1.29*  --   CALCIUM 8.8* 8.2*  --   MG  --   --  1.4*   GFR: Estimated Creatinine Clearance: 28.8 mL/min (A) (by C-G formula based on SCr of 1.29 mg/dL (H)). Liver Function Tests:  Recent Labs Lab 09/30/16 1300  AST 30  ALT 26  ALKPHOS 103  BILITOT 1.1  PROT 6.3*  ALBUMIN 3.2*    Recent Labs Lab 09/30/16 1300  LIPASE 24   No results for input(s): AMMONIA in the last 168 hours. Coagulation Profile: No results for input(s): INR, PROTIME in the last 168 hours. Cardiac Enzymes:  Recent Labs Lab 09/27/16 1338  CKTOTAL 836*   BNP (last 3 results) No results for input(s): PROBNP in the last 8760 hours. HbA1C: No results for input(s): HGBA1C in the last 72 hours. CBG: No results for input(s): GLUCAP in the last 168 hours. Lipid Profile: No results for input(s): CHOL, HDL, LDLCALC, TRIG, CHOLHDL, LDLDIRECT in the last 72 hours. Thyroid Function Tests: No results for input(s): TSH, T4TOTAL, FREET4, T3FREE, THYROIDAB in the last 72 hours. Anemia Panel: No results for input(s): VITAMINB12, FOLATE, FERRITIN, TIBC, IRON, RETICCTPCT in the last 72 hours. Urine analysis:    Component Value Date/Time   LABSPEC 1.025 05/17/2016 0930   PHURINE 6.0 05/17/2016 0930   GLUCOSEU Negative 05/17/2016 0930   HGBUR Negative 05/17/2016 0930   BILIRUBINUR Negative 05/17/2016 0930    KETONESUR Negative 05/17/2016 0930   PROTEINUR  Negative 09/07/2016 1222   UROBILINOGEN 0.2 05/17/2016 0930   NITRITE Negative 05/17/2016 0930   LEUKOCYTESUR Trace 05/17/2016 0930    Radiological Exams on Admission: Dg Chest 2 View  Result Date: 09/30/2016 CLINICAL DATA:  Diarrhea for several days. EXAM: CHEST  2 VIEW COMPARISON:  None. FINDINGS: A right Port-A-Cath is in good position. No pneumothorax. The cardiomediastinal silhouette is unremarkable. No nodules or masses identified on today's study. IMPRESSION: No active cardiopulmonary disease. Electronically Signed   By: Dorise Bullion III M.D   On: 09/30/2016 14:35    EKG: Independently reviewed.   Assessment/Plan Active Problems:   Enteritis  Gastroenteritis with fever probably related to chemotherapy. Will treat her with IV hydration. Obtain stool for C. Difficile. Will treat with vancomycin and cefepime to all the final cultures come back as the patient has a history of colon cancer chemotherapy and immunosuppressant.  Hypokalemia Due to diarrhea. Her potassium and recheck levels tomorrow.  Hyponatremia chronic follow levels IV hydration with normal saline.  Renal insufficiency acute due to dehydration treat with IV fluids and recheck levels tomorrow.       DVT prophylaxis: lovenox Code Status: full Family Communication:  Disposition Plan:   Consults called:  Admission status: inpatient   Georgette Shell MD [facility]  If 7PM-7AM, please contact night-coverage [email or web address] Password [facility]  09/30/2016, 5:12 PM

## 2016-09-30 NOTE — Progress Notes (Signed)
PHARMACIST - PHYSICIAN ORDER COMMUNICATION  CONCERNING: P&T Medication Policy on Herbal Medications  DESCRIPTION:  This patient's order for:  biotin  has been noted.  This product(s) is classified as an "herbal" or natural product. Due to a lack of definitive safety studies or FDA approval, nonstandard manufacturing practices, plus the potential risk of unknown drug-drug interactions while on inpatient medications, the Pharmacy and Therapeutics Committee does not permit the use of "herbal" or natural products of this type within [facility].   ACTION TAKEN: The pharmacy department is unable to verify this order at this time and your patient has been informed of this safety policy. Please reevaluate patient's clinical condition at discharge and address if the herbal or natural product(s) should be resumed at that time.  Minda Ditto PharmD Pager [phone] 09/30/2016, 6:43 PM

## 2016-09-30 NOTE — ED Notes (Signed)
Unable to obtain a clean sample due to patients bowel movements.

## 2016-09-30 NOTE — ED Notes (Signed)
Bed: EM75 Expected date:  Expected time:  Means of arrival:  Comments: Hold triage

## 2016-09-30 NOTE — Progress Notes (Signed)
A consult was received from an ED physician for vancomycin and cefepime per pharmacy dosing.  The patient's profile has been reviewed for ht/wt/allergies/indication/available labs.   A one time order has been placed for vancomycin 1g and cefepime 2g.  Further antibiotics/pharmacy consults should be ordered by admitting physician if indicated.                       Thank you, Peggyann Juba, PharmD, BCPS 09/30/2016 3:03 PM Pharmacy: [phone]

## 2016-09-30 NOTE — ED Notes (Signed)
Patient in restroom.

## 2016-09-30 NOTE — ED Provider Notes (Signed)
[facility] DEPT Provider Note   CSN: [id number] Arrival date & time: 09/30/16  1158     History   Chief Complaint Chief Complaint  Patient presents with  . Diarrhea  . Nausea  . Weakness    HPI Heidi Marquez is a 83 y.o. female.  HPI  83 year old female with a history of colon cancer currently on chemotherapy presents with diarrhea and fever. She's had loose and mucousy stools for the last 9 days. Was seen in the ED a few days ago because she also fell and injured her shoulder. Today she returns because she has a temperature of 100.8 orally. She has had a headache since the fall but otherwise no new headache. No sore throat, cough, congestion, chest pain, shortness of breath, or vomiting. She's been nauseated during this time. She's taken Imodium and Lomotil without relief. No urinary symptoms. No pain or swelling or redness to her right chest port.  Past Medical History:  Diagnosis Date  . Adenocarcinoma of colon [facility]) oncologist-  dr Truitt Merle---  last chemo 01-17-2015 to 06-27-2015)   dx 05-17-2014 sigmoid (left) cancer, Stage 2, G1 (pT3N0M0), MSI stable -- s/p  sigmoidectomy /  dx 12-08-2014 ascending colon cancer (right) , Stage 3B, G2, (pT4aN1cM0), MSI stable -- s/p  right hemicolectomy/  08-2015  recurrent peritoneal carcinomatosis  . Arthritis    knees  . Coronary atherosclerosis   . Diverticulosis of colon   . GERD (gastroesophageal reflux disease)   . HH (hiatus hernia)   . History of basal cell carcinoma excision    right side nose 2013  s/p  moh's  . History of epistaxis    2005  caurterized twice  . History of esophageal dilatation    2009 for stricture  . History of esophagitis    reflux  . Internal hemorrhoids   . Macular degeneration of both eyes   . Multinodular thyroid    remote hx benign bx 2005  . Peritoneal carcinomatosis ([place])    secondary to primary colon cancer  . Pulmonary nodule, right    noted since 2014-- stable per last ct 06-15-2015    . Thoracic ascending aortic aneurysm [facility])    followed by dr Cyndia Bent ([facility])  last ct 06-15-2015  4.3cm    Patient Active Problem List   Diagnosis Date Noted  . Port catheter in place 02/08/2016  . Cancer of left colon ([place]) 02/21/2015  . Diarrhea 02/01/2015  . Hand foot syndrome 02/01/2015  . Anorexia 02/01/2015  . Mucositis due to antineoplastic therapy 02/01/2015  . Encounter for chemotherapy management 12/23/2014  . Cancer of right colon ([place]) 05/17/2014  . Internal hemorrhoids with complication - Grade 3 prolapse 04/07/2014  . Rectocele 04/07/2014  . Pelvic floor dysfunction suspected  04/07/2014  . Nodule of right lung 10/11/2012  . Diverticulosis of colon with hemorrhage 01/24/2012  . ESOPHAGEAL STRICTURE 12/29/2007  . GERD 12/29/2007    Past Surgical History:  Procedure Laterality Date  . COLONOSCOPY WITH PROPOFOL N/A 09/30/2014   Procedure: COLONOSCOPY WITH PROPOFOL;  Surgeon: Milus Banister, MD;  Location: [facility];  Service: Endoscopy;  Laterality: N/A;  . ESOPHAGOGASTRODUODENOSCOPY  2009  . INGUINAL HERNIA REPAIR Right early 1990s  . MOHS SURGERY  11/2011   nose  . PORTACATH PLACEMENT Right 01/24/2016   Procedure: INSERTION PORT-A-CATH;  Surgeon: Leighton Ruff, MD;  Location: [facility];  Service: General;  Laterality: Right;  . ROBOTIC ASSISTED RIGHT HEMICOLECTOMY  12/08/2014    dr  Leighton Ruff  . ROBOTIC ASSISTED SIGMOIDECTOMY  25/95/6387   dr Leighton Ruff  . UPPER GASTROINTESTINAL ENDOSCOPY    . VAGINAL HYSTERECTOMY  age 45   partial with the bladder tack    OB History    No data available       Home Medications    Prior to Admission medications   Medication Sig Start Date End Date Taking? Authorizing Provider  alendronate (FOSAMAX) 70 MG tablet Take 70 mg by mouth every 7 (seven) days. Sundays. Take with a full glass of water on an empty stomach.    [provider]  amLODipine (NORVASC) 5 MG tablet Take 1 tablet (5 mg  total) by mouth daily. 06/21/16   Truitt Merle, MD  Ascorbic Acid (VITAMIN C) 1000 MG tablet Take 1,000 mg by mouth daily.    [provider]  Biotin 1000 MCG tablet Take 1,000 mcg by mouth daily.     [provider]  CALCIUM & MAGNESIUM CARBONATES PO Take 1 tablet by mouth daily.    [provider]  cholecalciferol (VITAMIN D) 1000 UNITS tablet Take 1,000 Units by mouth daily.    [provider]  diphenoxylate-atropine (LOMOTIL) 2.5-0.025 MG tablet Take 1-2 tablets by mouth 4 (four) times daily as needed for diarrhea or loose stools. 09/20/16   Truitt Merle, MD  fluticasone (FLONASE) 50 MCG/ACT nasal spray Place 1 spray into both nostrils daily.    [provider]  glucosamine-chondroitin 500-400 MG tablet Take 1 tablet by mouth daily.     [provider]  Homeopathic Products ([employer] LEG CRAMPS PAIN RELIEF PO) Take 1 tablet by mouth daily as needed (for leg cramps).    [provider]  ibuprofen (ADVIL,MOTRIN) 200 MG tablet Take 200 mg by mouth daily as needed for moderate pain.     [provider]  lidocaine-prilocaine (EMLA) cream Apply to affected area once 01/12/16   Truitt Merle, MD  loratadine (CLARITIN) 10 MG tablet Take 10 mg by mouth daily.    [provider]  Multiple Vitamin (MULTIVITAMIN) tablet Take 1 tablet by mouth daily.    [provider]  ondansetron (ZOFRAN) 8 MG tablet Take 1 tablet (8 mg total) by mouth every 8 (eight) hours as needed (Nausea or vomiting). 01/12/16   Truitt Merle, MD  potassium chloride SA (K-DUR,KLOR-CON) 20 MEQ tablet Take 1 tablet (20 mEq total) by mouth daily. 05/02/15   Truitt Merle, MD  prochlorperazine (COMPAZINE) 10 MG tablet Take 1 tablet (10 mg total) by mouth every 8 (eight) hours as needed for nausea (Nausea or vomiting). 09/20/16   Truitt Merle, MD  ranitidine (ZANTAC) 150 MG tablet Take 150 mg by mouth as needed for heartburn.    [provider]  vitamin B-12 (CYANOCOBALAMIN)  1000 MCG tablet Take 1,000 mcg by mouth daily.     [provider]    Family History Family History  Problem Relation Age of Onset  . Colon cancer Paternal Grandfather 82       colon cancer   . Stroke Father   . Heart attack Son 6       died of heart attack   . Esophageal cancer Neg Hx   . Rectal cancer Neg Hx   . Stomach cancer Neg Hx     Social History Social History  Substance Use Topics  . Smoking status: Never Smoker  . Smokeless tobacco: Never Used  . Alcohol use No     Allergies  Sulfa antibiotics; Amoxicillin-pot clavulanate; and Penicillins   Review of Systems Review of Systems  Constitutional: Positive for fever.  Respiratory: Negative for cough and shortness of breath.   Cardiovascular: Negative for chest pain.  Gastrointestinal: Positive for diarrhea and nausea. Negative for abdominal pain, blood in stool and vomiting.  Genitourinary: Negative for dysuria.  Neurological: Positive for weakness.  All other systems reviewed and are negative.    Physical Exam Updated Vital Signs BP (!) 145/129   Pulse (!) 104   Temp 98.4 F (36.9 C) (Oral)   Resp 19   SpO2 100%   Physical Exam  Constitutional: She is oriented to person, place, and time. She appears well-developed and well-nourished.  HENT:  Head: Normocephalic and atraumatic.  Right Ear: External ear normal.  Left Ear: External ear normal.  Nose: Nose normal.  Eyes: Right eye exhibits no discharge. Left eye exhibits no discharge.  Neck: Neck supple.  Cardiovascular: Normal rate, regular rhythm and normal heart sounds.   Pulmonary/Chest: Effort normal and breath sounds normal.  Right chest port without signs of inflammation  Abdominal: Soft. She exhibits no distension. There is no tenderness.  Neurological: She is alert and oriented to person, place, and time.  Skin: Skin is warm and dry.  Nursing note and vitals reviewed.    ED Treatments / Results  Labs (all labs ordered are  listed, but only abnormal results are displayed) Labs Reviewed  COMPREHENSIVE METABOLIC PANEL - Abnormal; Notable for the following:       Result Value   Sodium 133 (*)    Potassium 2.8 (*)    Glucose, Bld 121 (*)    Creatinine, Ser 1.29 (*)    Calcium 8.2 (*)    Total Protein 6.3 (*)    Albumin 3.2 (*)    GFR calc non Af Amer 38 (*)    GFR calc Af Amer 44 (*)    All other components within normal limits  CBC - Abnormal; Notable for the following:    WBC 1.2 (*)    RBC 3.08 (*)    Hemoglobin 9.4 (*)    HCT 27.1 (*)    All other components within normal limits  DIFFERENTIAL - Abnormal; Notable for the following:    Neutro Abs 0.3 (*)    Lymphs Abs 0.5 (*)    All other components within normal limits  MAGNESIUM - Abnormal; Notable for the following:    Magnesium 1.4 (*)    All other components within normal limits  CULTURE, BLOOD (ROUTINE X 2)  CULTURE, BLOOD (ROUTINE X 2)  URINE CULTURE  C DIFFICILE QUICK SCREEN W PCR REFLEX  GASTROINTESTINAL PANEL BY PCR, STOOL (REPLACES STOOL CULTURE)  LIPASE, BLOOD  URINALYSIS, ROUTINE W REFLEX MICROSCOPIC  I-STAT CG4 LACTIC ACID, ED  I-STAT CG4 LACTIC ACID, ED    EKG  EKG Interpretation  Date/Time:  Sunday September 30 2016 14:23:43 EDT Ventricular Rate:  89 PR Interval:    QRS Duration: 98 QT Interval:  369 QTC Calculation: 449 R Axis:   -28 Text Interpretation:  Sinus rhythm Abnormal R-wave progression, late transition Left ventricular hypertrophy Confirmed by Sherwood Gambler [phone]) on 09/30/2016 2:33:33 PM       Radiology Dg Chest 2 View  Result Date: 09/30/2016 CLINICAL DATA:  Diarrhea for several days. EXAM: CHEST  2 VIEW COMPARISON:  None. FINDINGS: A right Port-A-Cath is in good position. No pneumothorax. The cardiomediastinal silhouette is unremarkable. No nodules or masses identified on today's study. IMPRESSION: No  active cardiopulmonary disease. Electronically Signed   By: Dorise Bullion III M.D   On: 09/30/2016  14:35    Procedures Procedures (including critical care time)  Medications Ordered in ED Medications  vancomycin (VANCOCIN) IVPB 1000 mg/200 mL premix (1,000 mg Intravenous New Bag/Given 09/30/16 1537)  potassium chloride SA (K-DUR,KLOR-CON) CR tablet 40 mEq (40 mEq Oral Given 09/30/16 1430)  sodium chloride 0.9 % bolus 1,000 mL (0 mLs Intravenous Stopped 09/30/16 1517)  ceFEPIme (MAXIPIME) 2 g in dextrose 5 % 50 mL IVPB (2 g Intravenous New Bag/Given 09/30/16 1537)     Initial Impression / Assessment and Plan / ED Course  I have reviewed the triage vital signs and the nursing notes.  Pertinent labs & imaging results that were available during my care of the patient were reviewed by me and considered in my medical decision making (see chart for details).     Patient appears generally weak but otherwise overall well appearing. She is not currently febrile but with her reported fever at home and her immunosuppression, she will be admitted to the hospitalist service. I discussed with oncology Dr. Benay Spice, who asks for vancomycin, cefepime, and admission for fluids and electrolyte replacement. I agree with this disposition given she is getting dehydrated and having low potassium despite trying Lomotil and Imodium. This is likely side effect of her chemotherapy but will also test for C. Difficile.  Final Clinical Impressions(s) / ED Diagnoses   Final diagnoses:  Diarrhea, unspecified type  Hypokalemia    New Prescriptions New Prescriptions   No medications on file     Sherwood Gambler, MD 09/30/16 1627

## 2016-10-01 DIAGNOSIS — D701 Agranulocytosis secondary to cancer chemotherapy: Secondary | ICD-10-CM

## 2016-10-01 DIAGNOSIS — R5081 Fever presenting with conditions classified elsewhere: Secondary | ICD-10-CM

## 2016-10-01 DIAGNOSIS — E440 Moderate protein-calorie malnutrition: Secondary | ICD-10-CM

## 2016-10-01 DIAGNOSIS — K521 Toxic gastroenteritis and colitis: Secondary | ICD-10-CM

## 2016-10-01 DIAGNOSIS — D6481 Anemia due to antineoplastic chemotherapy: Secondary | ICD-10-CM

## 2016-10-01 DIAGNOSIS — N179 Acute kidney failure, unspecified: Secondary | ICD-10-CM

## 2016-10-01 DIAGNOSIS — D709 Neutropenia, unspecified: Secondary | ICD-10-CM

## 2016-10-01 DIAGNOSIS — C189 Malignant neoplasm of colon, unspecified: Secondary | ICD-10-CM

## 2016-10-01 LAB — URINALYSIS, ROUTINE W REFLEX MICROSCOPIC
BILIRUBIN URINE: NEGATIVE
Bacteria, UA: NONE SEEN
GLUCOSE, UA: NEGATIVE mg/dL
Ketones, ur: NEGATIVE mg/dL
LEUKOCYTES UA: NEGATIVE
NITRITE: NEGATIVE
PROTEIN: NEGATIVE mg/dL
Specific Gravity, Urine: 1.013 (ref 1.005–1.030)
pH: 5 (ref 5.0–8.0)

## 2016-10-01 LAB — GASTROINTESTINAL PANEL BY PCR, STOOL (REPLACES STOOL CULTURE)
ASTROVIRUS: NOT DETECTED
Adenovirus F40/41: NOT DETECTED
CAMPYLOBACTER SPECIES: NOT DETECTED
CRYPTOSPORIDIUM: NOT DETECTED
Cyclospora cayetanensis: NOT DETECTED
ENTEROAGGREGATIVE E COLI (EAEC): NOT DETECTED
ENTEROPATHOGENIC E COLI (EPEC): NOT DETECTED
ENTEROTOXIGENIC E COLI (ETEC): NOT DETECTED
Entamoeba histolytica: NOT DETECTED
GIARDIA LAMBLIA: NOT DETECTED
NOROVIRUS GI/GII: NOT DETECTED
Plesimonas shigelloides: NOT DETECTED
ROTAVIRUS A: NOT DETECTED
SALMONELLA SPECIES: NOT DETECTED
SHIGELLA/ENTEROINVASIVE E COLI (EIEC): NOT DETECTED
Sapovirus (I, II, IV, and V): NOT DETECTED
Shiga like toxin producing E coli (STEC): NOT DETECTED
VIBRIO CHOLERAE: NOT DETECTED
Vibrio species: NOT DETECTED
Yersinia enterocolitica: NOT DETECTED

## 2016-10-01 LAB — CBC
HCT: 24.5 % — ABNORMAL LOW (ref 36.0–46.0)
HEMATOCRIT: 27.1 % — AB (ref 36.0–46.0)
HEMOGLOBIN: 9.4 g/dL — AB (ref 12.0–15.0)
Hemoglobin: 8.4 g/dL — ABNORMAL LOW (ref 12.0–15.0)
MCH: 30.5 pg (ref 26.0–34.0)
MCH: 30.4 pg (ref 26.0–34.0)
MCHC: 34.7 g/dL (ref 30.0–36.0)
MCHC: 34.3 g/dL (ref 30.0–36.0)
MCV: 88 fL (ref 78.0–100.0)
MCV: 88.8 fL (ref 78.0–100.0)
Platelets: 166 10*3/uL (ref 150–400)
Platelets: 152 10*3/uL (ref 150–400)
RBC: 3.08 MIL/uL — AB (ref 3.87–5.11)
RBC: 2.76 MIL/uL — ABNORMAL LOW (ref 3.87–5.11)
RDW: 13.6 % (ref 11.5–15.5)
RDW: 13.9 % (ref 11.5–15.5)
WBC: 1.2 10*3/uL — CL (ref 4.0–10.5)
WBC: 1.5 10*3/uL — ABNORMAL LOW (ref 4.0–10.5)

## 2016-10-01 LAB — BASIC METABOLIC PANEL
Anion gap: 7 (ref 5–15)
BUN: 16 mg/dL (ref 6–20)
CO2: 18 mmol/L — ABNORMAL LOW (ref 22–32)
Calcium: 7.9 mg/dL — ABNORMAL LOW (ref 8.9–10.3)
Chloride: 111 mmol/L (ref 101–111)
Creatinine, Ser: 0.93 mg/dL (ref 0.44–1.00)
GFR calc Af Amer: >60 mL/min (ref >60)
GFR calc non Af Amer: 56 mL/min — ABNORMAL LOW (ref >60)
Glucose, Bld: 96 mg/dL (ref 65–99)
Potassium: 4.6 mmol/L (ref 3.5–5.1)
Sodium: 136 mmol/L (ref 135–145)

## 2016-10-01 MED ORDER — GI COCKTAIL ~~LOC~~
30.0000 mL | Freq: Once | ORAL | Status: AC
  Administered 2016-10-01: 30 mL via ORAL
  Filled 2016-10-01: qty 30

## 2016-10-01 MED ORDER — HYDRALAZINE HCL 20 MG/ML IJ SOLN
5.0000 mg | Freq: Once | INTRAMUSCULAR | Status: AC
  Administered 2016-10-01: 5 mg via INTRAVENOUS
  Filled 2016-10-01: qty 0.25

## 2016-10-01 MED ORDER — DIPHENOXYLATE-ATROPINE 2.5-0.025 MG PO TABS
2.0000 | ORAL_TABLET | Freq: Four times a day (QID) | ORAL | Status: DC | PRN
  Administered 2016-10-01 – 2016-10-04 (×6): 2 via ORAL
  Filled 2016-10-01 (×6): qty 2

## 2016-10-01 MED ORDER — LOPERAMIDE HCL 2 MG PO CAPS
2.0000 mg | ORAL_CAPSULE | ORAL | Status: DC | PRN
  Administered 2016-10-01 (×2): 2 mg via ORAL
  Filled 2016-10-01 (×2): qty 1

## 2016-10-01 MED ORDER — ENSURE ENLIVE PO LIQD
237.0000 mL | Freq: Two times a day (BID) | ORAL | Status: DC
  Administered 2016-10-03 (×2): 237 mL via ORAL

## 2016-10-01 MED ORDER — ALUM & MAG HYDROXIDE-SIMETH 200-200-20 MG/5ML PO SUSP
30.0000 mL | Freq: Four times a day (QID) | ORAL | Status: DC | PRN
  Administered 2016-10-01 – 2016-10-02 (×3): 30 mL via ORAL
  Filled 2016-10-01 (×3): qty 30

## 2016-10-01 NOTE — Care Management Note (Signed)
Case Management Note  Patient Details  Name: Heidi Marquez MRN: [id number] Date of Birth: 1933-05-18  Subjective/Objective:     83 yo admitted with Enteritis. Currently being treated with chemo for colon cancer.               Action/Plan: From home. Pt with recent fall at home and could benefit from a PT eval for disposition planning. CM will continue to follow and assist as needed.  Expected Discharge Date:                  Expected Discharge Plan:  Home/Self Care  In-House Referral:     Discharge planning Services  CM Consult  Post Acute Care Choice:    Choice offered to:     DME Arranged:    DME Agency:     HH Arranged:    HH Agency:     Status of Service:  In process, will continue to follow  If discussed at Long Length of Stay Meetings, dates discussed:    Additional CommentsLynnell Catalan, RN 10/01/2016, 10:12 AM  [phone]

## 2016-10-01 NOTE — Progress Notes (Signed)
PROGRESS NOTE  ELONDA GIULIANO HWT:[id number] DOB: 02-01-1934 DOA: 09/30/2016 PCP: Prince Solian, MD  HPI/Recap of past 8 hours: 83 year old female with past mental history of colon cancer currently receiving chemotherapy presented to the emergency room on 8/19 with fever along with recurrent diarrhea for several days. No relief despite Imodium and Lomotil. No other signs of infection found. Patient started on IV fluids and antidiarrheal medications. Oncology consulted. Patient also found to be in acute kidney injury, as well as neutropenic with a white count of 1.2 and absolute neutrophil count of 0.3.  Patient's diarrhea continues to persist. She otherwise is feeling okay. No change in appetite. No abdominal pain. She does feel a little bit better than yesterday.  C. difficile cultures negative.   Assessment/Plan: Active Problems:   Enteritis: Slow improvement.  If symptoms persist into tomorrow or fever, will check CT evaluating for colitis History of colon cancer on chemotherapy: Oncology notified. Acute kidney injury: Secondary to dehydration and diarrhea. Normalized with IV fluids. Anemia: Likely secondary to chronic disease from cancer. Hemoglobin dropped from 9.4-8.4 today although likely suspect this is more hemoconcentration initially on admission given dehydration.  Code Status: Full code   Family Communication:    Disposition Plan: Continues inpatient until diarrhea improves    Consultants:  Oncology   Procedures:  None   Antimicrobials:  IV cefepime 8/19-present   DVT prophylaxis:  SCDs   Objective: Vitals:   09/30/16 1728 09/30/16 2330 10/01/16 0653 10/01/16 1336  BP: (!) 146/86 123/67 131/70 128/74  Pulse: 96 84 73 76  Resp: 16 16 16 16   Temp: 99.5 F (37.5 C) 99.5 F (37.5 C) 98.4 F (36.9 C) 98.6 F (37 C)  TempSrc: Oral Oral Oral Oral  SpO2: 98% 97% 99% 98%  Weight: 58.4 kg (128 lb 11.2 oz)     Height: 5\' 4"  (1.626 m)        Intake/Output Summary (Last 24 hours) at 10/01/16 1822 Last data filed at 10/01/16 1300  Gross per 24 hour  Intake              480 ml  Output                0 ml  Net              480 ml   Filed Weights   09/30/16 1728  Weight: 58.4 kg (128 lb 11.2 oz)    Exam:   General:  A&O x 3, NAD  HEENT: [place], AT, mucous membranes are moist   Cardiovascular: RRR S1S2   Chest: port noted  Respiratory: CTA Bilaterally   Abdomen: soft, NT, ND, +BS   Musculoskeletal: No clubbing or cyanosis or edema   Skin: No skin breaks, tears or lesions  Psychiatry: Appropriate, no psychosis    Data Reviewed: CBC:  Recent Labs Lab 09/27/16 1338 09/30/16 1300 10/01/16 0500  WBC 0.5* 1.2* 1.5*  NEUTROABS 0.1* 0.3*  --   HGB 9.8* 9.4* 8.4*  HCT 28.7* 27.1* 24.5*  MCV 89.7 88.0 88.8  PLT 87* 166 917   Basic Metabolic Panel:  Recent Labs Lab 09/27/16 1338 09/30/16 1300 09/30/16 1315 10/01/16 0500  NA 137 133*  --  136  K 3.7 2.8*  --  4.6  CL 103 101  --  111  CO2 24 22  --  18*  GLUCOSE 104* 121*  --  96  BUN 31* 20  --  16  CREATININE 1.20* 1.29*  --  0.93  CALCIUM 8.8* 8.2*  --  7.9*  MG  --   --  1.4*  --    GFR: Estimated Creatinine Clearance: 41 mL/min (by C-G formula based on SCr of 0.93 mg/dL). Liver Function Tests:  Recent Labs Lab 09/30/16 1300  AST 30  ALT 26  ALKPHOS 103  BILITOT 1.1  PROT 6.3*  ALBUMIN 3.2*    Recent Labs Lab 09/30/16 1300  LIPASE 24   No results for input(s): AMMONIA in the last 168 hours. Coagulation Profile: No results for input(s): INR, PROTIME in the last 168 hours. Cardiac Enzymes:  Recent Labs Lab 09/27/16 1338  CKTOTAL 836*   BNP (last 3 results) No results for input(s): PROBNP in the last 8760 hours. HbA1C: No results for input(s): HGBA1C in the last 72 hours. CBG: No results for input(s): GLUCAP in the last 168 hours. Lipid Profile: No results for input(s): CHOL, HDL, LDLCALC, TRIG, CHOLHDL, LDLDIRECT  in the last 72 hours. Thyroid Function Tests: No results for input(s): TSH, T4TOTAL, FREET4, T3FREE, THYROIDAB in the last 72 hours. Anemia Panel: No results for input(s): VITAMINB12, FOLATE, FERRITIN, TIBC, IRON, RETICCTPCT in the last 72 hours. Urine analysis:    Component Value Date/Time   LABSPEC 1.025 05/17/2016 0930   PHURINE 6.0 05/17/2016 0930   GLUCOSEU Negative 05/17/2016 0930   HGBUR Negative 05/17/2016 0930   BILIRUBINUR Negative 05/17/2016 0930   KETONESUR Negative 05/17/2016 0930   PROTEINUR Negative 09/07/2016 1222   UROBILINOGEN 0.2 05/17/2016 0930   NITRITE Negative 05/17/2016 0930   LEUKOCYTESUR Trace 05/17/2016 0930   Sepsis Labs: @LABRCNTIP (procalcitonin:4,lacticidven:4)  ) Recent Results (from the past 240 hour(s))  C difficile quick scan w PCR reflex     Status: None   Collection Time: 09/30/16  1:26 PM  Result Value Ref Range Status   C Diff antigen NEGATIVE NEGATIVE Final   C Diff toxin NEGATIVE NEGATIVE Final   C Diff interpretation No C. difficile detected.  Final  Gastrointestinal Panel by PCR , Stool     Status: None   Collection Time: 09/30/16  1:26 PM  Result Value Ref Range Status   Campylobacter species NOT DETECTED NOT DETECTED Final   Plesimonas shigelloides NOT DETECTED NOT DETECTED Final   Salmonella species NOT DETECTED NOT DETECTED Final   Yersinia enterocolitica NOT DETECTED NOT DETECTED Final   Vibrio species NOT DETECTED NOT DETECTED Final   Vibrio cholerae NOT DETECTED NOT DETECTED Final   Enteroaggregative E coli (EAEC) NOT DETECTED NOT DETECTED Final   Enteropathogenic E coli (EPEC) NOT DETECTED NOT DETECTED Final   Enterotoxigenic E coli (ETEC) NOT DETECTED NOT DETECTED Final   Shiga like toxin producing E coli (STEC) NOT DETECTED NOT DETECTED Final   Shigella/Enteroinvasive E coli (EIEC) NOT DETECTED NOT DETECTED Final   Cryptosporidium NOT DETECTED NOT DETECTED Final   Cyclospora cayetanensis NOT DETECTED NOT DETECTED Final    Entamoeba histolytica NOT DETECTED NOT DETECTED Final   Giardia lamblia NOT DETECTED NOT DETECTED Final   Adenovirus F40/41 NOT DETECTED NOT DETECTED Final   Astrovirus NOT DETECTED NOT DETECTED Final   Norovirus GI/GII NOT DETECTED NOT DETECTED Final   Rotavirus A NOT DETECTED NOT DETECTED Final   Sapovirus (I, II, IV, and V) NOT DETECTED NOT DETECTED Final  Culture, blood (routine x 2)     Status: None (Preliminary result)   Collection Time: 09/30/16  1:43 PM  Result Value Ref Range Status   Specimen Description LEFT ANTECUBITAL  Final  Special Requests   Final    BOTTLES DRAWN AEROBIC AND ANAEROBIC Blood Culture adequate volume   Culture   Final    NO GROWTH < 24 HOURS Performed at [facility], [place] [address], [place], [place] [address]    Report Status PENDING  Incomplete  Culture, blood (routine x 2)     Status: None (Preliminary result)   Collection Time: 09/30/16  1:43 PM  Result Value Ref Range Status   Specimen Description RIGHT ANTECUBITAL  Final   Special Requests   Final    BOTTLES DRAWN AEROBIC AND ANAEROBIC Blood Culture adequate volume   Culture   Final    NO GROWTH < 24 HOURS Performed at [facility], [place] [address], [place], [place] [address]    Report Status PENDING  Incomplete      Studies: No results found.  Scheduled Meds: . amLODipine  5 mg Oral Daily  . cholecalciferol  1,000 Units Oral Daily  . feeding supplement (ENSURE ENLIVE)  237 mL Oral BID BM  . fluticasone  1 spray Each Nare Daily  . loratadine  10 mg Oral Daily  . vitamin B-12  1,000 mcg Oral Daily    Continuous Infusions: . 0.9 % NaCl with KCl 40 mEq / L 125 mL/hr (10/01/16 1148)  . ceFEPime (MAXIPIME) IV Stopped (10/01/16 1742)     LOS: 1 day     Annita Brod, MD [facility] Pager [phone]  If 7PM-7AM, please contact night-coverage [email or web address] Password [email or web address] 10/01/2016, 6:22 PM

## 2016-10-01 NOTE — Progress Notes (Signed)
JOSHUA ZERINGUE   DOB:1933-04-20   YC#:[id number]   JSH#:[id number]  Oncology follow up  Subjective: Patient is well known to me, under my care for her metastatic colon cancer. She is currently on palliative chemotherapy irinotecan and Avastin, she was admitted for neutropenic fever and diarrhea. She overall feels better today, afebrile.   Objective:  Vitals:   10/01/16 2131 10/01/16 2203  BP: (!) 167/82 (!) 159/105  Pulse: 98 99  Resp: 15   Temp: 98.5 F (36.9 C)   SpO2: 99%     Body mass index is 22.09 kg/m.  Intake/Output Summary (Last 24 hours) at 10/01/16 2228 Last data filed at 10/01/16 1900  Gross per 24 hour  Intake              720 ml  Output                0 ml  Net              720 ml     Sclerae unicteric  Oropharynx clear  No peripheral adenopathy  Lungs clear -- no rales or rhonchi  Heart regular rate and rhythm  Abdomen benign  MSK no focal spinal tenderness, no peripheral edema  Neuro nonfocal    CBG (last 3)  No results for input(s): GLUCAP in the last 72 hours.   Labs:  Lab Results  Component Value Date   WBC 1.5 (L) 10/01/2016   HGB 8.4 (L) 10/01/2016   HCT 24.5 (L) 10/01/2016   MCV 88.8 10/01/2016   PLT 152 10/01/2016   NEUTROABS 0.3 (L) 09/30/2016    Urine Studies No results for input(s): UHGB, CRYS in the last 72 hours.  Invalid input(s): UACOL, UAPR, USPG, UPH, UTP, UGL, UKET, UBIL, UNIT, UROB, ULEU, UEPI, UWBC, URBC, UBAC, CAST, [place], [place]  Basic Metabolic Panel:  Recent Labs Lab 09/27/16 1338 09/30/16 1300 09/30/16 1315 10/01/16 0500  NA 137 133*  --  136  K 3.7 2.8*  --  4.6  CL 103 101  --  111  CO2 24 22  --  18*  GLUCOSE 104* 121*  --  96  BUN 31* 20  --  16  CREATININE 1.20* 1.29*  --  0.93  CALCIUM 8.8* 8.2*  --  7.9*  MG  --   --  1.4*  --    GFR Estimated Creatinine Clearance: 41 mL/min (by C-G formula based on SCr of 0.93 mg/dL). Liver Function Tests:  Recent Labs Lab 09/30/16 1300  AST 30  ALT 26   ALKPHOS 103  BILITOT 1.1  PROT 6.3*  ALBUMIN 3.2*    Recent Labs Lab 09/30/16 1300  LIPASE 24   No results for input(s): AMMONIA in the last 168 hours. Coagulation profile No results for input(s): INR, PROTIME in the last 168 hours.  CBC:  Recent Labs Lab 09/27/16 1338 09/30/16 1300 10/01/16 0500  WBC 0.5* 1.2* 1.5*  NEUTROABS 0.1* 0.3*  --   HGB 9.8* 9.4* 8.4*  HCT 28.7* 27.1* 24.5*  MCV 89.7 88.0 88.8  PLT 87* 166 152   Cardiac Enzymes:  Recent Labs Lab 09/27/16 1338  CKTOTAL 836*   BNP: Invalid input(s): POCBNP CBG: No results for input(s): GLUCAP in the last 168 hours. D-Dimer No results for input(s): DDIMER in the last 72 hours. Hgb A1c No results for input(s): HGBA1C in the last 72 hours. Lipid Profile No results for input(s): CHOL, HDL, LDLCALC, TRIG, CHOLHDL, LDLDIRECT in the last 72 hours.  Thyroid function studies No results for input(s): TSH, T4TOTAL, T3FREE, THYROIDAB in the last 72 hours.  Invalid input(s): FREET3 Anemia work up No results for input(s): VITAMINB12, FOLATE, FERRITIN, TIBC, IRON, RETICCTPCT in the last 72 hours. Microbiology Recent Results (from the past 240 hour(s))  C difficile quick scan w PCR reflex     Status: None   Collection Time: 09/30/16  1:26 PM  Result Value Ref Range Status   C Diff antigen NEGATIVE NEGATIVE Final   C Diff toxin NEGATIVE NEGATIVE Final   C Diff interpretation No C. difficile detected.  Final  Gastrointestinal Panel by PCR , Stool     Status: None   Collection Time: 09/30/16  1:26 PM  Result Value Ref Range Status   Campylobacter species NOT DETECTED NOT DETECTED Final   Plesimonas shigelloides NOT DETECTED NOT DETECTED Final   Salmonella species NOT DETECTED NOT DETECTED Final   Yersinia enterocolitica NOT DETECTED NOT DETECTED Final   Vibrio species NOT DETECTED NOT DETECTED Final   Vibrio cholerae NOT DETECTED NOT DETECTED Final   Enteroaggregative E coli (EAEC) NOT DETECTED NOT DETECTED  Final   Enteropathogenic E coli (EPEC) NOT DETECTED NOT DETECTED Final   Enterotoxigenic E coli (ETEC) NOT DETECTED NOT DETECTED Final   Shiga like toxin producing E coli (STEC) NOT DETECTED NOT DETECTED Final   Shigella/Enteroinvasive E coli (EIEC) NOT DETECTED NOT DETECTED Final   Cryptosporidium NOT DETECTED NOT DETECTED Final   Cyclospora cayetanensis NOT DETECTED NOT DETECTED Final   Entamoeba histolytica NOT DETECTED NOT DETECTED Final   Giardia lamblia NOT DETECTED NOT DETECTED Final   Adenovirus F40/41 NOT DETECTED NOT DETECTED Final   Astrovirus NOT DETECTED NOT DETECTED Final   Norovirus GI/GII NOT DETECTED NOT DETECTED Final   Rotavirus A NOT DETECTED NOT DETECTED Final   Sapovirus (I, II, IV, and V) NOT DETECTED NOT DETECTED Final  Culture, blood (routine x 2)     Status: None (Preliminary result)   Collection Time: 09/30/16  1:43 PM  Result Value Ref Range Status   Specimen Description LEFT ANTECUBITAL  Final   Special Requests   Final    BOTTLES DRAWN AEROBIC AND ANAEROBIC Blood Culture adequate volume   Culture   Final    NO GROWTH < 24 HOURS Performed at [facility], [address] [place], [place] [address]    Report Status PENDING  Incomplete  Culture, blood (routine x 2)     Status: None (Preliminary result)   Collection Time: 09/30/16  1:43 PM  Result Value Ref Range Status   Specimen Description RIGHT ANTECUBITAL  Final   Special Requests   Final    BOTTLES DRAWN AEROBIC AND ANAEROBIC Blood Culture adequate volume   Culture   Final    NO GROWTH < 24 HOURS Performed at [facility], [place] [address], [place], [place] [address]    Report Status PENDING  Incomplete      Studies:  Dg Chest 2 View  Result Date: 09/30/2016 CLINICAL DATA:  Diarrhea for several days. EXAM: CHEST  2 VIEW COMPARISON:  None. FINDINGS: A right Port-A-Cath is in good position. No pneumothorax. The cardiomediastinal silhouette is unremarkable. No nodules or masses  identified on today's study. IMPRESSION: No active cardiopulmonary disease. Electronically Signed   By: Dorise Bullion III M.D   On: 09/30/2016 14:35    Assessment: 83 y.o. with metastatic colon cancer, currently on palliative hemotherapy with irinotecan and Avastin, was admitted for neutropenic  fever and diarrhea.  1. Neutropenic fever 2. Diarrhea, secondary to chemotherapy versus neutropenic enteritis 3. AKI secondary to diarrhea  4. Anemia secondary to chemo  5. Moderate protein and calori malnutrition   Plan:  -she is on broad antibiotics cefepime for neutropenic fever, blood cultures negative so far. C. difficile and GI panel negative so far. -If [place] remains to be below 1.5 tomorrow, please give granix 350mcg daily until ANC>1.5k  -she has been tolerating chemo poorly despite dose reduction. We discussed holding chemo for the next 3-4 weeks, and see how well she recovers. Her systemic treatment options are very limited due to her advanced age and tolerance issue  -continue supportive care. Continue lomotil as needed  -I appreciate the excellent care from the hospitalist team  Truitt Merle, MD 10/01/2016  10:28 PM

## 2016-10-02 ENCOUNTER — Encounter (HOSPITAL_COMMUNITY): Payer: Self-pay | Admitting: Radiology

## 2016-10-02 ENCOUNTER — Inpatient Hospital Stay (HOSPITAL_COMMUNITY): Payer: PPO

## 2016-10-02 DIAGNOSIS — E875 Hyperkalemia: Secondary | ICD-10-CM

## 2016-10-02 DIAGNOSIS — T451X5A Adverse effect of antineoplastic and immunosuppressive drugs, initial encounter: Secondary | ICD-10-CM

## 2016-10-02 DIAGNOSIS — K529 Noninfective gastroenteritis and colitis, unspecified: Principal | ICD-10-CM

## 2016-10-02 LAB — CBC WITH DIFFERENTIAL/PLATELET
Basophils Absolute: 0 10*3/uL (ref 0.0–0.1)
Basophils Relative: 2 %
EOS PCT: 7 %
Eosinophils Absolute: 0.1 10*3/uL (ref 0.0–0.7)
HEMATOCRIT: 24.7 % — AB (ref 36.0–46.0)
Hemoglobin: 8.7 g/dL — ABNORMAL LOW (ref 12.0–15.0)
LYMPHS ABS: 0.5 10*3/uL — AB (ref 0.7–4.0)
Lymphocytes Relative: 25 %
MCH: 31.3 pg (ref 26.0–34.0)
MCHC: 35.2 g/dL (ref 30.0–36.0)
MCV: 88.8 fL (ref 78.0–100.0)
MONO ABS: 0.4 10*3/uL (ref 0.1–1.0)
Monocytes Relative: 22 %
NEUTROS ABS: 0.8 10*3/uL — AB (ref 1.7–7.7)
Neutrophils Relative %: 44 %
Platelets: 193 10*3/uL (ref 150–400)
RBC: 2.78 MIL/uL — AB (ref 3.87–5.11)
RDW: 14 % (ref 11.5–15.5)
WBC: 1.8 10*3/uL — AB (ref 4.0–10.5)

## 2016-10-02 LAB — BASIC METABOLIC PANEL
ANION GAP: 4 — AB (ref 5–15)
BUN: 10 mg/dL (ref 6–20)
CHLORIDE: 114 mmol/L — AB (ref 101–111)
CO2: 23 mmol/L (ref 22–32)
CREATININE: 0.62 mg/dL (ref 0.44–1.00)
Calcium: 8.4 mg/dL — ABNORMAL LOW (ref 8.9–10.3)
GLUCOSE: 114 mg/dL — AB (ref 65–99)
POTASSIUM: 5.4 mmol/L — AB (ref 3.5–5.1)
Sodium: 141 mmol/L (ref 135–145)

## 2016-10-02 MED ORDER — IOPAMIDOL (ISOVUE-300) INJECTION 61%
INTRAVENOUS | Status: AC
  Filled 2016-10-02: qty 30

## 2016-10-02 MED ORDER — SODIUM CHLORIDE 0.9% FLUSH: 10.0000 mL | INTRAVENOUS | Status: DC | PRN

## 2016-10-02 MED ORDER — IOPAMIDOL (ISOVUE-300) INJECTION 61%
INTRAVENOUS | Status: AC
  Administered 2016-10-02: 100 mL via INTRAVENOUS
  Filled 2016-10-02: qty 100

## 2016-10-02 MED ORDER — ADULT MULTIVITAMIN W/MINERALS CH
1.0000 | ORAL_TABLET | Freq: Every day | ORAL | Status: DC
  Administered 2016-10-02 – 2016-10-04 (×3): 1 via ORAL
  Filled 2016-10-02 (×3): qty 1

## 2016-10-02 MED ORDER — SODIUM CHLORIDE 0.9 % IV SOLN
INTRAVENOUS | Status: DC
  Administered 2016-10-02 – 2016-10-04 (×4): via INTRAVENOUS

## 2016-10-02 MED ORDER — IOPAMIDOL (ISOVUE-300) INJECTION 61%
15.0000 mL | Freq: Two times a day (BID) | INTRAVENOUS | Status: AC | PRN
  Administered 2016-10-02 (×2): 15 mL via ORAL
  Filled 2016-10-02 (×2): qty 30

## 2016-10-02 NOTE — Progress Notes (Signed)
Initial Nutrition Assessment  DOCUMENTATION CODES:   Severe malnutrition in context of chronic illness  INTERVENTION:   -Continue Ensure Enlive po BID, each supplement provides 350 kcal and 20 grams of protein -Encourage PO intake -Reviewed stool-bulking foods with patient -RD will continue to monitor  NUTRITION DIAGNOSIS:   Malnutrition(severe) related to chronic illness, cancer and cancer related treatments as evidenced by percent weight loss, energy intake < or equal to 75% for > or equal to 1 month, moderate depletion of body fat, moderate depletions of muscle mass.  GOAL:   Patient will meet greater than or equal to 90% of their needs  MONITOR:   PO intake, Supplement acceptance, Labs, Weight trends, I & O's  REASON FOR ASSESSMENT:   Malnutrition Screening Tool    ASSESSMENT:   84 year old female with past mental history of colon cancer currently receiving chemotherapy presented to the emergency room on 8/19 with fever along with recurrent diarrhea for several days. No relief despite Imodium and Lomotil. No other signs of infection found. Patient started on IV fluids and antidiarrheal medications.   Patient reports an episode of vomiting this morning after eating breakfast. Pt tolerated her meals yesterday. States she continues to have loose stools  8/20 intake as follows: Not meeting needs with this intake alone. B: 65% ~290 kcal, 17g protein L: 60% ~400 kcal, 19g protein D: 50% ~190 kcal, 3g protein  Patient has been ordered Ensure supplements and has not drank any yet. Pt states she hasn't tried supplements at home d/t eating well. Encouraged pt to try them during this admission. Pt currently drinking contrast for abdominal x-ray.  Per chart review, pt has lost 9 lb since 6/7 (7% wt loss x 2.5 months, significant for time frame). Nutrition-Focused physical exam completed. Findings are moderate fat depletion, moderate muscle depletion, and no edema.   Medications:  Vitamin D tablet daily, Vitamin B-12 tablet daily, Maalox suspension PRN  Labs reviewed: Elevated K  Diet Order:  Diet regular Room service appropriate? Yes; Fluid consistency: Thin  Skin:  Reviewed, no issues  Last BM:  8/20  Height:   Ht Readings from Last 1 Encounters:  09/30/16 5\' 4"  (1.626 m)    Weight:   Wt Readings from Last 1 Encounters:  09/30/16 128 lb 11.2 oz (58.4 kg)    Ideal Body Weight:  54.5 kg  BMI:  Body mass index is 22.09 kg/m.  Estimated Nutritional Needs:   Kcal:  8768-1157  Protein:  80-90g  Fluid:  2L/day  EDUCATION NEEDS:   Education needs addressed  Clayton Bibles, MS, RD, LDN Pager: [phone] After Hours Pager: [phone]

## 2016-10-02 NOTE — Progress Notes (Signed)
PROGRESS NOTE  Heidi Marquez VOH:[id number] DOB: 01-09-1934 DOA: 09/30/2016 PCP: Prince Solian, MD  HPI/Recap of past 23 hours: 83 year old female with past mental history of colon cancer currently receiving chemotherapy presented to the emergency room on 8/19 with fever along with recurrent diarrhea for several days. No relief despite Imodium and Lomotil. No other signs of infection found. Patient started on IV fluids and antidiarrheal medications. Oncology consulted. Patient also found to be in acute kidney injury, as well as neutropenic with a white count of 1.2 and absolute neutrophil count of 0.3.    Patient feels somewhat better overall.  Patient's diarrhea continues to persist despite Lomotil and Imodium.  This morning, had an episode of vomiting.White blood cell count up to 1.8 with absolute neutrophils of 0.8  Assessment/Plan: Active Problems:   Enteritis: Slow improvement.  Given persistence of symptoms,we'll go ahead and check CT of abdomen and pelvis. Continue antibiotics.  History of colon cancer on chemotherapy: Oncology notified.  Acute kidney injury: Secondary to dehydration and diarrhea. Normalized with IV fluids.  Hyperkalemia: Changed IV fluids to normal saline without potassium  Anemia: Likely secondary to chronic disease from cancer. Hemoglobin dropped from 9.4-8.4 today although likely suspect this is more hemoconcentration initially on admission given dehydration. Neutropenia: Secondary to chemotherapy. Slowly improving. Appreciate oncology follow-up and if still neutropenic tomorrow, we'll give dose of Granix  Elevated blood pressures:patient was getting hydrated. Question underlying volume overload. We'll check BNP and give 1 dose of IV Lasix.  Code Status: Full code   Family Communication:    Disposition Plan: Continues inpatient until diarrhea and neutropeniaimprove   Consultants:  Oncology   Procedures:  None   Antimicrobials:  IV cefepime  8/19-present   DVT prophylaxis:  SCDs   Objective: Vitals:   10/01/16 1336 10/01/16 2131 10/01/16 2203 10/02/16 0523  BP: 128/74 (!) 167/82 (!) 159/105 (!) 152/82  Pulse: 76 98 99 96  Resp: 16 15  16   Temp: 98.6 F (37 C) 98.5 F (36.9 C)  98.2 F (36.8 C)  TempSrc: Oral Oral  Oral  SpO2: 98% 99%  98%  Weight:      Height:        Intake/Output Summary (Last 24 hours) at 10/02/16 0853 Last data filed at 10/02/16 0315  Gross per 24 hour  Intake           5007.5 ml  Output                0 ml  Net           5007.5 ml   Filed Weights   09/30/16 1728  Weight: 58.4 kg (128 lb 11.2 oz)    Exam:   General:  Alert and oriented 3, no acute distress  HEENT: [place], AT, mucous membranes are moist   Cardiovascular: regular rate and rhythm, S1 and S2  Chest: port noted  Respiratory: clear to auscultation bilaterally, breathing not labored   Abdomen: soft, nontender, nondistended, positive bowel sounds  Musculoskeletal: No clubbing or cyanosis or edema  Neuro: No focal deficits   Skin: No skin breaks, tears or lesions  Psychiatry: Appropriate, no psychosis    Data Reviewed: CBC:  Recent Labs Lab 09/27/16 1338 09/30/16 1300 10/01/16 0500 10/02/16 0420  WBC 0.5* 1.2* 1.5* 1.8*  NEUTROABS 0.1* 0.3*  --  0.8*  HGB 9.8* 9.4* 8.4* 8.7*  HCT 28.7* 27.1* 24.5* 24.7*  MCV 89.7 88.0 88.8 88.8  PLT 87* 166 152 193  Basic Metabolic Panel:  Recent Labs Lab 09/27/16 1338 09/30/16 1300 09/30/16 1315 10/01/16 0500 10/02/16 0420  NA 137 133*  --  136 141  K 3.7 2.8*  --  4.6 5.4*  CL 103 101  --  111 114*  CO2 24 22  --  18* 23  GLUCOSE 104* 121*  --  96 114*  BUN 31* 20  --  16 10  CREATININE 1.20* 1.29*  --  0.93 0.62  CALCIUM 8.8* 8.2*  --  7.9* 8.4*  MG  --   --  1.4*  --   --    GFR: Estimated Creatinine Clearance: 47.6 mL/min (by C-G formula based on SCr of 0.62 mg/dL). Liver Function Tests:  Recent Labs Lab 09/30/16 1300  AST 30  ALT 26    ALKPHOS 103  BILITOT 1.1  PROT 6.3*  ALBUMIN 3.2*    Recent Labs Lab 09/30/16 1300  LIPASE 24   No results for input(s): AMMONIA in the last 168 hours. Coagulation Profile: No results for input(s): INR, PROTIME in the last 168 hours. Cardiac Enzymes:  Recent Labs Lab 09/27/16 1338  CKTOTAL 836*   BNP (last 3 results) No results for input(s): PROBNP in the last 8760 hours. HbA1C: No results for input(s): HGBA1C in the last 72 hours. CBG: No results for input(s): GLUCAP in the last 168 hours. Lipid Profile: No results for input(s): CHOL, HDL, LDLCALC, TRIG, CHOLHDL, LDLDIRECT in the last 72 hours. Thyroid Function Tests: No results for input(s): TSH, T4TOTAL, FREET4, T3FREE, THYROIDAB in the last 72 hours. Anemia Panel: No results for input(s): VITAMINB12, FOLATE, FERRITIN, TIBC, IRON, RETICCTPCT in the last 72 hours. Urine analysis:    Component Value Date/Time   COLORURINE YELLOW 10/01/2016 1836   APPEARANCEUR CLEAR 10/01/2016 1836   LABSPEC 1.013 10/01/2016 1836   LABSPEC 1.025 05/17/2016 0930   PHURINE 5.0 10/01/2016 1836   GLUCOSEU NEGATIVE 10/01/2016 1836   GLUCOSEU Negative 05/17/2016 0930   HGBUR SMALL (A) 10/01/2016 1836   BILIRUBINUR NEGATIVE 10/01/2016 1836   BILIRUBINUR Negative 05/17/2016 0930   KETONESUR NEGATIVE 10/01/2016 1836   PROTEINUR NEGATIVE 10/01/2016 1836   UROBILINOGEN 0.2 05/17/2016 0930   NITRITE NEGATIVE 10/01/2016 1836   LEUKOCYTESUR NEGATIVE 10/01/2016 1836   LEUKOCYTESUR Trace 05/17/2016 0930   Sepsis Labs: @LABRCNTIP (procalcitonin:4,lacticidven:4)  ) Recent Results (from the past 240 hour(s))  C difficile quick scan w PCR reflex     Status: None   Collection Time: 09/30/16  1:26 PM  Result Value Ref Range Status   C Diff antigen NEGATIVE NEGATIVE Final   C Diff toxin NEGATIVE NEGATIVE Final   C Diff interpretation No C. difficile detected.  Final  Gastrointestinal Panel by PCR , Stool     Status: None   Collection Time:  09/30/16  1:26 PM  Result Value Ref Range Status   Campylobacter species NOT DETECTED NOT DETECTED Final   Plesimonas shigelloides NOT DETECTED NOT DETECTED Final   Salmonella species NOT DETECTED NOT DETECTED Final   Yersinia enterocolitica NOT DETECTED NOT DETECTED Final   Vibrio species NOT DETECTED NOT DETECTED Final   Vibrio cholerae NOT DETECTED NOT DETECTED Final   Enteroaggregative E coli (EAEC) NOT DETECTED NOT DETECTED Final   Enteropathogenic E coli (EPEC) NOT DETECTED NOT DETECTED Final   Enterotoxigenic E coli (ETEC) NOT DETECTED NOT DETECTED Final   Shiga like toxin producing E coli (STEC) NOT DETECTED NOT DETECTED Final   Shigella/Enteroinvasive E coli (EIEC) NOT DETECTED NOT DETECTED Final  Cryptosporidium NOT DETECTED NOT DETECTED Final   Cyclospora cayetanensis NOT DETECTED NOT DETECTED Final   Entamoeba histolytica NOT DETECTED NOT DETECTED Final   Giardia lamblia NOT DETECTED NOT DETECTED Final   Adenovirus F40/41 NOT DETECTED NOT DETECTED Final   Astrovirus NOT DETECTED NOT DETECTED Final   Norovirus GI/GII NOT DETECTED NOT DETECTED Final   Rotavirus A NOT DETECTED NOT DETECTED Final   Sapovirus (I, II, IV, and V) NOT DETECTED NOT DETECTED Final  Culture, blood (routine x 2)     Status: None (Preliminary result)   Collection Time: 09/30/16  1:43 PM  Result Value Ref Range Status   Specimen Description LEFT ANTECUBITAL  Final   Special Requests   Final    BOTTLES DRAWN AEROBIC AND ANAEROBIC Blood Culture adequate volume   Culture   Final    NO GROWTH < 24 HOURS Performed at [facility], [address] [place], [place] [address]    Report Status PENDING  Incomplete  Culture, blood (routine x 2)     Status: None (Preliminary result)   Collection Time: 09/30/16  1:43 PM  Result Value Ref Range Status   Specimen Description RIGHT ANTECUBITAL  Final   Special Requests   Final    BOTTLES DRAWN AEROBIC AND ANAEROBIC Blood Culture adequate volume   Culture    Final    NO GROWTH < 24 HOURS Performed at [facility], [place] [address], [place], [place] [address]    Report Status PENDING  Incomplete      Studies: No results found.  Scheduled Meds: . amLODipine  5 mg Oral Daily  . cholecalciferol  1,000 Units Oral Daily  . feeding supplement (ENSURE ENLIVE)  237 mL Oral BID BM  . fluticasone  1 spray Each Nare Daily  . loratadine  10 mg Oral Daily  . vitamin B-12  1,000 mcg Oral Daily    Continuous Infusions: . sodium chloride    . ceFEPime (MAXIPIME) IV Stopped (10/02/16 0450)     LOS: 2 days     Annita Brod, MD [facility] Pager [phone]  If 7PM-7AM, please contact night-coverage [email or web address] Password [facility] 10/02/2016, 8:53 AM

## 2016-10-03 ENCOUNTER — Encounter: Payer: Self-pay | Admitting: General Practice

## 2016-10-03 DIAGNOSIS — R197 Diarrhea, unspecified: Secondary | ICD-10-CM

## 2016-10-03 DIAGNOSIS — D649 Anemia, unspecified: Secondary | ICD-10-CM

## 2016-10-03 DIAGNOSIS — C785 Secondary malignant neoplasm of large intestine and rectum: Secondary | ICD-10-CM

## 2016-10-03 LAB — MAGNESIUM: MAGNESIUM: 1.5 mg/dL — AB (ref 1.7–2.4)

## 2016-10-03 LAB — CBC WITH DIFFERENTIAL/PLATELET
Basophils Absolute: 0 10*3/uL (ref 0.0–0.1)
Basophils Relative: 1 %
EOS ABS: 0.2 10*3/uL (ref 0.0–0.7)
Eosinophils Relative: 6 %
HEMATOCRIT: 25.3 % — AB (ref 36.0–46.0)
HEMOGLOBIN: 8.7 g/dL — AB (ref 12.0–15.0)
LYMPHS PCT: 25 %
Lymphs Abs: 0.7 10*3/uL (ref 0.7–4.0)
MCH: 31 pg (ref 26.0–34.0)
MCHC: 34.4 g/dL (ref 30.0–36.0)
MCV: 90 fL (ref 78.0–100.0)
MONOS PCT: 19 %
Monocytes Absolute: 0.5 10*3/uL (ref 0.1–1.0)
NEUTROS ABS: 1.2 10*3/uL — AB (ref 1.7–7.7)
NEUTROS PCT: 49 %
Platelets: 208 10*3/uL (ref 150–400)
RBC: 2.81 MIL/uL — ABNORMAL LOW (ref 3.87–5.11)
RDW: 14.5 % (ref 11.5–15.5)
WBC: 2.6 10*3/uL — ABNORMAL LOW (ref 4.0–10.5)

## 2016-10-03 LAB — BASIC METABOLIC PANEL
ANION GAP: 5 (ref 5–15)
BUN: 7 mg/dL (ref 6–20)
CHLORIDE: 107 mmol/L (ref 101–111)
CO2: 23 mmol/L (ref 22–32)
Calcium: 8.3 mg/dL — ABNORMAL LOW (ref 8.9–10.3)
Creatinine, Ser: 0.56 mg/dL (ref 0.44–1.00)
GFR calc non Af Amer: >60 mL/min (ref >60)
GLUCOSE: 97 mg/dL (ref 65–99)
POTASSIUM: 4.2 mmol/L (ref 3.5–5.1)
Sodium: 135 mmol/L (ref 135–145)

## 2016-10-03 LAB — URINE CULTURE: Culture: NO GROWTH

## 2016-10-03 LAB — HEPATIC FUNCTION PANEL
ALBUMIN: 2.5 g/dL — AB (ref 3.5–5.0)
ALK PHOS: 79 U/L (ref 38–126)
ALT: 26 U/L (ref 14–54)
AST: 26 U/L (ref 15–41)
BILIRUBIN INDIRECT: 0.4 mg/dL (ref 0.3–0.9)
BILIRUBIN TOTAL: 0.6 mg/dL (ref 0.3–1.2)
Bilirubin, Direct: 0.2 mg/dL (ref 0.1–0.5)
TOTAL PROTEIN: 5.2 g/dL — AB (ref 6.5–8.1)

## 2016-10-03 MED ORDER — MAGNESIUM SULFATE 2 GM/50ML IV SOLN
2.0000 g | Freq: Once | INTRAVENOUS | Status: AC
  Administered 2016-10-03: 2 g via INTRAVENOUS
  Filled 2016-10-03: qty 50

## 2016-10-03 NOTE — Progress Notes (Signed)
[place] Spiritual Care Note  Visited with Heidi Marquez inpt at [facility] today per referral from [facility], who spent time with her following her husband's death at [facility] yesterday.  ([place]/MD[facility], [facility] cared for family at [facility], referring Matt to support on this campus.)  Per pt, Heidi Marquez and [name] were married for almost 43 years, during the last three of which she had been his caregiver (due to dementia, COPD, heart issues, and other medical problems).  She shared that "he'd been slipping away from [place] for some time" and "he told us he wouldn't want to live hooked up to machines," perspectives that help her cope with the loss.  She shared stories, conducted some life review, and worked on Scientist, forensic.  I provided emotional support, pastoral reflection, bereavement care, a prayer shawl as a tangible sign of comfort and support, and prayer at bedside per her request.  Heidi Marquez was very appreciative of this deeply connecting visit and is aware of ongoing chaplain availability at both [place] and [facility], and she has my card and brochure.  At this time she is focusing on getting well physically and returning home (with the relief and grief that it will bring) and plans to contact Support Team as needed later on.    [place], [place], [facility] M-F daytime pager [phone] [facility] 24/7 pager [phone] Voicemail [phone]

## 2016-10-03 NOTE — Progress Notes (Signed)
Pharmacy Antibiotic Note  Heidi Marquez is a 83 y.o. female admitted on 09/30/2016 with febrile neutropenia.  Pharmacy has been consulted for Vancomycin and Cefepime dosing.  Plan:  Vancomycin discontinued  Day 4 Cefepime 2 g IV q12 hr  WBC up to 2.6 today, ANC 1200  Height: 5\' 4"  (162.6 cm) Weight: 128 lb 11.2 oz (58.4 kg) IBW/kg (Calculated) : 54.7  Temp (24hrs), Avg:98.7 F (37.1 C), Min:98.4 F (36.9 C), Max:99 F (37.2 C)   Recent Labs Lab 09/27/16 1338 09/30/16 1250 09/30/16 1300 10/01/16 0500 10/02/16 0420 10/03/16 0423  WBC 0.5*  --  1.2* 1.5* 1.8* 2.6*  CREATININE 1.20*  --  1.29* 0.93 0.62 0.56  LATICACIDVEN  --  1.40  --   --   --   --     Estimated Creatinine Clearance: 47.6 mL/min (by C-G formula based on SCr of 0.56 mg/dL).    Allergies  Allergen Reactions  . Sulfa Antibiotics Swelling    Eyes,mouth  . Amoxicillin-Pot Clavulanate Rash  . Penicillins Swelling and Rash    Has patient had a PCN reaction causing immediate rash, facial/tongue/throat swelling, SOB or lightheadedness with hypotension: Yes Has patient had a PCN reaction causing severe rash involving mucus membranes or skin necrosis: Unknown Has patient had a PCN reaction that required hospitalization: No Has patient had a PCN reaction occurring within the last 10 years: No If all of the above answers are "NO", then may proceed with Cephalosporin use.    Antimicrobials this admission:  8/19 Vanc x 1 8/19 Cefepime x 1  Dose adjustments this admission:   Microbiology results:  8/19 BCx: ngtd 8/20 UCx: ng-final 8/19 C.diff: negative/negative 8/19 GI panel: neg  Thank you for allowing pharmacy to be a part of this patient's care.  Minda Ditto PharmD Pager [phone] 10/03/2016, 12:35 PM

## 2016-10-03 NOTE — Care Management Important Message (Signed)
Important Message  Patient Details  Name: Heidi Marquez MRN: [id number] Date of Birth: 12-30-1933   Medicare Important Message Given:  Yes    Kerin Salen 10/03/2016, 11:32 AMImportant Message  Patient Details  Name: Heidi Marquez MRN: [id number] Date of Birth: 05-29-1933   Medicare Important Message Given:  Yes    Kerin Salen 10/03/2016, 11:32 AM

## 2016-10-03 NOTE — Evaluation (Signed)
Physical Therapy Evaluation Patient Details Name: Heidi Marquez MRN: [id number] DOB: [birth year] Today's Date: 10/03/2016   History of Present Illness  83 yo female admitted with gastroenteritis, hypokalemia, hyponatremia, renal insufficiency, neutropenic fever. hx of colon cancer-palliative chemo, macular degeneration    Clinical Impression  On eval, pt was Min guard assist for mobility. She walked ~750 feet without an assistive device. She is mildly unsteady at times. She tolerated the distance well. Encouraged pt to consider using her cane if she is feeling particularly unsteady. She politely declines any PT follow up. Recommended to pt that she continue ambulating around unit as tolerated.     Follow Up Recommendations No PT follow up (pt politely declines f/u)     Equipment Recommendations  None recommended by PT    Recommendations for Other Services       Precautions / Restrictions Precautions Precautions: Fall Restrictions Weight Bearing Restrictions: No      Mobility  Bed Mobility Overal bed mobility: Independent                Transfers Overall transfer level: Independent                  Ambulation/Gait Ambulation/Gait assistance: Min guard Ambulation Distance (Feet): 750 Feet Assistive device: None Gait Pattern/deviations: Step-through pattern;Decreased stride length;Drifts right/left;Staggering left;Staggering right     General Gait Details: slow gait speed. unsteady at times. no overt LOB.   Stairs            Wheelchair Mobility    Modified Rankin (Stroke Patients Only)       Balance Overall balance assessment: Needs assistance           Standing balance-Leahy Scale: Good                               Pertinent Vitals/Pain Pain Assessment: No/denies pain    Home Living Family/patient expects to be discharged to:: Private residence Living Arrangements: Alone Available Help at Discharge: Family;Available  PRN/intermittently Type of Home: House Home Access: Stairs to enter Entrance Stairs-Rails: Right Entrance Stairs-Number of Steps: 3 Home Layout: One level Home Equipment: Walker - 2 wheels;Cane - single point;Bedside commode;Shower seat      Prior Function Level of Independence: Independent               Hand Dominance        Extremity/Trunk Assessment   Upper Extremity Assessment Upper Extremity Assessment: Overall WFL for tasks assessed    Lower Extremity Assessment Lower Extremity Assessment: Generalized weakness    Cervical / Trunk Assessment Cervical / Trunk Assessment: Kyphotic  Communication   Communication: No difficulties  Cognition Arousal/Alertness: Awake/alert Behavior During Therapy: WFL for tasks assessed/performed Overall Cognitive Status: Within Functional Limits for tasks assessed                                        General Comments      Exercises     Assessment/Plan    PT Assessment Patient needs continued PT services  PT Problem List Decreased mobility;Decreased balance       PT Treatment Interventions Therapeutic activities;Therapeutic exercise;Gait training;Patient/family education;Functional mobility training;Balance training    PT Goals (Current goals can be found in the Care Plan section)  Acute Rehab PT Goals Patient Stated Goal: home tomorrow PT Goal Formulation: With  patient Time For Goal Achievement: 10/17/16    Frequency     Barriers to discharge        Co-evaluation               AM-PAC PT "6 Clicks" Daily Activity  Outcome Measure Difficulty turning over in bed (including adjusting bedclothes, sheets and blankets)?: None Difficulty moving from lying on back to sitting on the side of the bed? : None Difficulty sitting down on and standing up from a chair with arms (e.g., wheelchair, bedside commode, etc,.)?: None Help needed moving to and from a bed to chair (including a wheelchair)?:  None Help needed walking in hospital room?: A Little Help needed climbing 3-5 steps with a railing? : A Little 6 Click Score: 22    End of Session Equipment Utilized During Treatment: Gait belt Activity Tolerance: Patient tolerated treatment well Patient left: in bed;with call bell/phone within reach   PT Visit Diagnosis: Muscle weakness (generalized) (M62.81)    Time: 1050-1105 PT Time Calculation (min) (ACUTE ONLY): 15 min   Charges:   PT Evaluation $PT Eval Low Complexity: 1 Low     PT G Codes:          Weston Anna, MPT Pager: [phone]

## 2016-10-03 NOTE — Progress Notes (Signed)
[facility] PROGRESS NOTE  Heidi Marquez [id number]:[id number] DOB: May 15, 1933 DOA: 09/30/2016  PCP: Prince Solian, MD  Brief History/Interval Summary: 83 year old Caucasian female with past medical history of colon cancer, currently receiving chemotherapy, presented with fever, diarrhea. Evaluation was unremarkable for an infectious etiology. She was placed on antimotility agents.  Reason for Visit: Diarrhea  Consultants: Oncology  Procedures: None  Antibiotics: None  Subjective/Interval History: Patient states that her diarrhea appears to be subsiding. She only had 6 bowel movements in the last 24 hours. They have been small in amount. Denies any nausea, vomiting. No abdominal pain. Feels stronger.  ROS: Denies any chest pain or shortness of breath.  Objective:  Vital Signs  Vitals:   10/02/16 0523 10/02/16 1448 10/02/16 2039 10/03/16 0354  BP: (!) 152/82 (!) 156/83 135/80 (!) 152/70  Pulse: 96 80 96 83  Resp: 16 18 18 18   Temp: 98.2 F (36.8 C) 98.4 F (36.9 C) 98.7 F (37.1 C) 99 F (37.2 C)  TempSrc: Oral Oral Oral Oral  SpO2: 98% 98% 99% 97%  Weight:      Height:        Intake/Output Summary (Last 24 hours) at 10/03/16 1157 Last data filed at 10/03/16 2703  Gross per 24 hour  Intake           1667.5 ml  Output                0 ml  Net           1667.5 ml   Filed Weights   09/30/16 1728  Weight: 58.4 kg (128 lb 11.2 oz)    General appearance: alert, cooperative, appears stated age and no distress Resp: clear to auscultation bilaterally Cardio: regular rate and rhythm, S1, S2 normal, no murmur, click, rub or gallop GI: soft, non-tender; bowel sounds normal; no masses,  no organomegaly Extremities: extremities normal, atraumatic, no cyanosis or edema Neurologic: Awake, alert. No obvious focal neurological deficits noted.  Lab Results:  Data Reviewed: I have personally reviewed following labs and imaging studies  CBC:  Recent Labs Lab  09/27/16 1338 09/30/16 1300 10/01/16 0500 10/02/16 0420 10/03/16 0423  WBC 0.5* 1.2* 1.5* 1.8* 2.6*  NEUTROABS 0.1* 0.3*  --  0.8* 1.2*  HGB 9.8* 9.4* 8.4* 8.7* 8.7*  HCT 28.7* 27.1* 24.5* 24.7* 25.3*  MCV 89.7 88.0 88.8 88.8 90.0  PLT 87* 166 152 193 500    Basic Metabolic Panel:  Recent Labs Lab 09/27/16 1338 09/30/16 1300 09/30/16 1315 10/01/16 0500 10/02/16 0420 10/03/16 0423  NA 137 133*  --  136 141 135  K 3.7 2.8*  --  4.6 5.4* 4.2  CL 103 101  --  111 114* 107  CO2 24 22  --  18* 23 23  GLUCOSE 104* 121*  --  96 114* 97  BUN 31* 20  --  16 10 7   CREATININE 1.20* 1.29*  --  0.93 0.62 0.56  CALCIUM 8.8* 8.2*  --  7.9* 8.4* 8.3*  MG  --   --  1.4*  --   --  1.5*    GFR: Estimated Creatinine Clearance: 47.6 mL/min (by C-G formula based on SCr of 0.56 mg/dL).  Liver Function Tests:  Recent Labs Lab 09/30/16 1300 10/03/16 0423  AST 30 26  ALT 26 26  ALKPHOS 103 79  BILITOT 1.1 0.6  PROT 6.3* 5.2*  ALBUMIN 3.2* 2.5*     Recent Labs Lab 09/30/16 1300  LIPASE 24  Cardiac Enzymes:  Recent Labs Lab 09/27/16 1338  CKTOTAL 836*     Recent Results (from the past 240 hour(s))  C difficile quick scan w PCR reflex     Status: None   Collection Time: 09/30/16  1:26 PM  Result Value Ref Range Status   C Diff antigen NEGATIVE NEGATIVE Final   C Diff toxin NEGATIVE NEGATIVE Final   C Diff interpretation No C. difficile detected.  Final  Gastrointestinal Panel by PCR , Stool     Status: None   Collection Time: 09/30/16  1:26 PM  Result Value Ref Range Status   Campylobacter species NOT DETECTED NOT DETECTED Final   Plesimonas shigelloides NOT DETECTED NOT DETECTED Final   Salmonella species NOT DETECTED NOT DETECTED Final   Yersinia enterocolitica NOT DETECTED NOT DETECTED Final   Vibrio species NOT DETECTED NOT DETECTED Final   Vibrio cholerae NOT DETECTED NOT DETECTED Final   Enteroaggregative E coli (EAEC) NOT DETECTED NOT DETECTED Final    Enteropathogenic E coli (EPEC) NOT DETECTED NOT DETECTED Final   Enterotoxigenic E coli (ETEC) NOT DETECTED NOT DETECTED Final   Shiga like toxin producing E coli (STEC) NOT DETECTED NOT DETECTED Final   Shigella/Enteroinvasive E coli (EIEC) NOT DETECTED NOT DETECTED Final   Cryptosporidium NOT DETECTED NOT DETECTED Final   Cyclospora cayetanensis NOT DETECTED NOT DETECTED Final   Entamoeba histolytica NOT DETECTED NOT DETECTED Final   Giardia lamblia NOT DETECTED NOT DETECTED Final   Adenovirus F40/41 NOT DETECTED NOT DETECTED Final   Astrovirus NOT DETECTED NOT DETECTED Final   Norovirus GI/GII NOT DETECTED NOT DETECTED Final   Rotavirus A NOT DETECTED NOT DETECTED Final   Sapovirus (I, II, IV, and V) NOT DETECTED NOT DETECTED Final  Culture, blood (routine x 2)     Status: None (Preliminary result)   Collection Time: 09/30/16  1:43 PM  Result Value Ref Range Status   Specimen Description LEFT ANTECUBITAL  Final   Special Requests   Final    BOTTLES DRAWN AEROBIC AND ANAEROBIC Blood Culture adequate volume   Culture   Final    NO GROWTH 3 DAYS Performed at [facility], [address] [place], [place] [address]    Report Status PENDING  Incomplete  Culture, blood (routine x 2)     Status: None (Preliminary result)   Collection Time: 09/30/16  1:43 PM  Result Value Ref Range Status   Specimen Description RIGHT ANTECUBITAL  Final   Special Requests   Final    BOTTLES DRAWN AEROBIC AND ANAEROBIC Blood Culture adequate volume   Culture   Final    NO GROWTH 3 DAYS Performed at [facility], [address] [place], [place] [address]    Report Status PENDING  Incomplete  Urine culture     Status: None   Collection Time: 10/01/16  6:36 PM  Result Value Ref Range Status   Specimen Description URINE, CLEAN CATCH  Final   Special Requests NONE  Final   Culture   Final    NO GROWTH Performed at [facility], [place] [address], [place], [place] [address]    Report  Status 10/03/2016 FINAL  Final      Radiology Studies: Ct Abdomen Pelvis W Contrast  Result Date: 10/02/2016 CLINICAL DATA:  The the history of colon cancer and peritoneal carcinomatosis. EXAM: CT ABDOMEN AND PELVIS WITH CONTRAST TECHNIQUE: Multidetector CT imaging of the abdomen and pelvis was performed using the standard  protocol following bolus administration of intravenous contrast. CONTRAST:  135mL ISOVUE-300 IOPAMIDOL (ISOVUE-300) INJECTION 61% COMPARISON:  CT scan 07/17/2016 FINDINGS: Lower chest: Chronic basilar lung changes with peritendinosis. No worrisome basilar lung lesions. Small subpleural nodule at the left lung base on image number 20 measures 4 mm and could be a lymph node. The heart is normal in size. No pericardial effusion. Small hiatal hernia. Hepatobiliary: No mass, inflammation or ductal dilatation. There are gallstones noted the gallbladder along with gallbladder wall thickening and pericholecystic inflammation. Findings could represent acute cholecystitis or peritoneal carcinomatosis. No common bile duct dilatation. Pancreas: No mass, inflammation or duct dilatation. Spleen: Normal size.  Stable small low-attenuation lesions. Adrenals/Urinary Tract: The adrenal glands and kidneys are unremarkable and stable. Prominent extra renal pelvis on the right side but no ureteral or bladder calculi. There is moderate distention of the bladder but no bladder wall thickening. Stomach/Bowel: The stomach, duodenum, small bowel and colon are grossly normal. No acute inflammatory process, mass lesions or obstructive findings. Vascular/Lymphatic: Stable appearance of the aorta and branch vessels. Minimal atherosclerotic calcifications for age. No aneurysm or dissection. The major venous structures are patent. Reproductive: The uterus is surgically absent. I believe both ovaries are still present and appear normal. Other: The stable surgical changes from a sigmoid colon resection. No evidence of  obstruction or recurrent tumor. There are also surgical changes from a partial right hemicolectomy. Slightly smaller area of soft tissue thickening along the suture line likely peritoneal surface disease. Overall this appears relatively stable. Adjacent and just inferior to this area is a stable area of soft tissue thickening in the right retroperitoneum adjacent psoas muscle measuring 6.4 x 3.8 cm. It previously measured 7.2 x 5.1 cm. Multiple other areas of omental and peritoneal surface disease are demonstrated. Multiple omental nodules are noted on the right side. I do not see these portion were on the prior study. The largest measures 26.5 x 18 mm on image 20. Left-sided omental lesion measures 16 x 13 mm on image number 31. This previously measured 31.5 x 22 mm. Lesion in the root of the small bowel mesentery on image number 46 measures 13.5 x 18.5 mm and previously measured 12.0 x 16 0.0 mm. Musculoskeletal: No significant bony findings. IMPRESSION: Interval necks response is demonstrated. Some areas show definite improvement but there also some new omental nodules on the right side. Gallbladder wall thickening and pericholecystic inflammatory type changes along with gallstones. Could not exclude acute cholecystitis but this also could represent peritoneal surface disease. Recommend clinical correlation with any acute symptoms or physical exam findings. Electronically Signed   By: Marijo Sanes M.D.   On: 10/02/2016 13:21     Medications:  Scheduled: . amLODipine  5 mg Oral Daily  . cholecalciferol  1,000 Units Oral Daily  . feeding supplement (ENSURE ENLIVE)  237 mL Oral BID BM  . fluticasone  1 spray Each Nare Daily  . loratadine  10 mg Oral Daily  . multivitamin with minerals  1 tablet Oral Daily  . vitamin B-12  1,000 mcg Oral Daily   Continuous: . sodium chloride 50 mL/hr at 10/03/16 0945  . ceFEPime (MAXIPIME) IV Stopped (10/03/16 0446)   BMW:UXLK & mag hydroxide-simeth,  diphenoxylate-atropine, sodium chloride flush  Assessment/Plan:  Active Problems:   Enteritis   Metastatic colon cancer in female [facility])    Acute diarrhea/enteritis. No concerning changes noted on CT scan of the abdomen and pelvis. Diarrhea appears to be subsiding. Continue antimotility agents. Cut back  on IV fluids.  History of colon cancer on chemotherapy. Outpatient management per oncology.  Neutropenia. Secondary to chemotherapy. Counts are slowly improving. Can hold off on Granix for now.  Normocytic anemia. Most likely secondary to chronic disease due to cancer. Hemoglobin is stable. No evidence for overt bleeding.  Acute kidney injury Secondary to dehydration and diarrhea. Normal with IV fluids.  DVT Prophylaxis: SCDs    Code Status: Full code  Family Communication: Discussed with the patient  Disposition Plan: Management as outlined above. Continue to mobilize. Anticipate discharge tomorrow.    LOS: 3 days   [facility] Pager [phone] 10/03/2016, 11:57 AM  If 7PM-7AM, please contact night-coverage at [email or web address], password [facility]

## 2016-10-04 ENCOUNTER — Ambulatory Visit: Payer: Self-pay

## 2016-10-04 ENCOUNTER — Other Ambulatory Visit: Payer: Self-pay

## 2016-10-04 ENCOUNTER — Ambulatory Visit: Payer: Self-pay | Admitting: Hematology

## 2016-10-04 LAB — CBC WITH DIFFERENTIAL/PLATELET
BASOS ABS: 0 10*3/uL (ref 0.0–0.1)
Basophils Relative: 1 %
Eosinophils Absolute: 0.2 10*3/uL (ref 0.0–0.7)
Eosinophils Relative: 5 %
HEMATOCRIT: 24.5 % — AB (ref 36.0–46.0)
HEMOGLOBIN: 8.5 g/dL — AB (ref 12.0–15.0)
LYMPHS ABS: 0.9 10*3/uL (ref 0.7–4.0)
LYMPHS PCT: 25 %
MCH: 31.3 pg (ref 26.0–34.0)
MCHC: 34.7 g/dL (ref 30.0–36.0)
MCV: 90.1 fL (ref 78.0–100.0)
MONOS PCT: 14 %
Monocytes Absolute: 0.5 10*3/uL (ref 0.1–1.0)
NEUTROS PCT: 55 %
Neutro Abs: 1.8 10*3/uL (ref 1.7–7.7)
Platelets: 236 10*3/uL (ref 150–400)
RBC: 2.72 MIL/uL — AB (ref 3.87–5.11)
RDW: 14.6 % (ref 11.5–15.5)
WBC: 3.4 10*3/uL — AB (ref 4.0–10.5)

## 2016-10-04 LAB — BASIC METABOLIC PANEL
ANION GAP: 7 (ref 5–15)
BUN: 9 mg/dL (ref 6–20)
CALCIUM: 8.2 mg/dL — AB (ref 8.9–10.3)
CO2: 24 mmol/L (ref 22–32)
Chloride: 107 mmol/L (ref 101–111)
Creatinine, Ser: 0.61 mg/dL (ref 0.44–1.00)
Glucose, Bld: 85 mg/dL (ref 65–99)
Potassium: 3.8 mmol/L (ref 3.5–5.1)
Sodium: 138 mmol/L (ref 135–145)

## 2016-10-04 LAB — MAGNESIUM: Magnesium: 1.9 mg/dL (ref 1.7–2.4)

## 2016-10-04 MED ORDER — CEFPODOXIME PROXETIL 200 MG PO TABS: 200.0000 mg | ORAL_TABLET | Freq: Two times a day (BID) | ORAL | 0 refills | Status: AC

## 2016-10-04 MED ORDER — CEFPODOXIME PROXETIL 200 MG PO TABS
200.0000 mg | ORAL_TABLET | Freq: Two times a day (BID) | ORAL | Status: DC
Start: 2016-10-04 — End: 2016-10-04
  Administered 2016-10-04: 200 mg via ORAL
  Filled 2016-10-04: qty 1

## 2016-10-04 NOTE — Discharge Summary (Signed)
[facility]  Physician Discharge Summary   Patient ID: Heidi Marquez MRN: [id number] DOB/AGE: [birth year] 81 y.o.  Admit date: 09/30/2016 Discharge date: 10/04/2016  PCP: Prince Solian, MD  DISCHARGE DIAGNOSES:  Active Problems:   Enteritis   Metastatic colon cancer in female [facility])   RECOMMENDATIONS FOR OUTPATIENT FOLLOW UP: 1. Patient has a follow-up appointment with her oncologist next week.   DISCHARGE CONDITION: fair  Diet recommendation: As before  Filed Weights   09/30/16 1728  Weight: 58.4 kg (128 lb 11.2 oz)    INITIAL HISTORY: 83 year old Caucasian female with past medical history of colon cancer, currently receiving chemotherapy, presented with fever, diarrhea. Evaluation was unremarkable for an infectious etiology. She was placed on antimotility agents.  Consultations:  Oncology   HOSPITAL COURSE:   Acute diarrhea/enteritis. No concerning changes noted on CT scan of the abdomen and pelvis. Diarrhea appears to be subsiding. She's only had about 3 BMs in the last 24 hours. Continue antimotility agents. Etiology unclear.  History of colon cancer on chemotherapy. Outpatient management per oncology.  Neutropenia. Secondary to chemotherapy. Counts are slowly improving.   Normocytic anemia. Most likely secondary to chronic disease due to cancer. Hemoglobin is stable. No evidence for overt bleeding.  Acute kidney injury Secondary to dehydration and diarrhea. Normal with IV fluids.  Overall, patient has significantly improved. Ambulating without any difficulty. Okay for discharge home today.   PERTINENT LABS:  The results of significant diagnostics from this hospitalization (including imaging, microbiology, ancillary and laboratory) are listed below for reference.    Microbiology: Recent Results (from the past 240 hour(s))  C difficile quick scan w PCR reflex     Status: None   Collection Time: 09/30/16  1:26 PM  Result Value Ref  Range Status   C Diff antigen NEGATIVE NEGATIVE Final   C Diff toxin NEGATIVE NEGATIVE Final   C Diff interpretation No C. difficile detected.  Final  Gastrointestinal Panel by PCR , Stool     Status: None   Collection Time: 09/30/16  1:26 PM  Result Value Ref Range Status   Campylobacter species NOT DETECTED NOT DETECTED Final   Plesimonas shigelloides NOT DETECTED NOT DETECTED Final   Salmonella species NOT DETECTED NOT DETECTED Final   Yersinia enterocolitica NOT DETECTED NOT DETECTED Final   Vibrio species NOT DETECTED NOT DETECTED Final   Vibrio cholerae NOT DETECTED NOT DETECTED Final   Enteroaggregative E coli (EAEC) NOT DETECTED NOT DETECTED Final   Enteropathogenic E coli (EPEC) NOT DETECTED NOT DETECTED Final   Enterotoxigenic E coli (ETEC) NOT DETECTED NOT DETECTED Final   Shiga like toxin producing E coli (STEC) NOT DETECTED NOT DETECTED Final   Shigella/Enteroinvasive E coli (EIEC) NOT DETECTED NOT DETECTED Final   Cryptosporidium NOT DETECTED NOT DETECTED Final   Cyclospora cayetanensis NOT DETECTED NOT DETECTED Final   Entamoeba histolytica NOT DETECTED NOT DETECTED Final   Giardia lamblia NOT DETECTED NOT DETECTED Final   Adenovirus F40/41 NOT DETECTED NOT DETECTED Final   Astrovirus NOT DETECTED NOT DETECTED Final   Norovirus GI/GII NOT DETECTED NOT DETECTED Final   Rotavirus A NOT DETECTED NOT DETECTED Final   Sapovirus (I, II, IV, and V) NOT DETECTED NOT DETECTED Final  Culture, blood (routine x 2)     Status: None (Preliminary result)   Collection Time: 09/30/16  1:43 PM  Result Value Ref Range Status   Specimen Description LEFT ANTECUBITAL  Final   Special Requests   Final    BOTTLES  DRAWN AEROBIC AND ANAEROBIC Blood Culture adequate volume   Culture   Final    NO GROWTH 4 DAYS Performed at [facility], [place] [address], [place], [place] [address]    Report Status PENDING  Incomplete  Culture, blood (routine x 2)     Status: None (Preliminary result)    Collection Time: 09/30/16  1:43 PM  Result Value Ref Range Status   Specimen Description RIGHT ANTECUBITAL  Final   Special Requests   Final    BOTTLES DRAWN AEROBIC AND ANAEROBIC Blood Culture adequate volume   Culture   Final    NO GROWTH 4 DAYS Performed at [facility], [place] [address], [place], [place] [address]    Report Status PENDING  Incomplete  Urine culture     Status: None   Collection Time: 10/01/16  6:36 PM  Result Value Ref Range Status   Specimen Description URINE, CLEAN CATCH  Final   Special Requests NONE  Final   Culture   Final    NO GROWTH Performed at [facility], [place] [address], [place], [place] [address]    Report Status 10/03/2016 FINAL  Final     Labs: Basic Metabolic Panel:  Recent Labs Lab 09/30/16 1300 09/30/16 1315 10/01/16 0500 10/02/16 0420 10/03/16 0423 10/04/16 0400  NA 133*  --  136 141 135 138  K 2.8*  --  4.6 5.4* 4.2 3.8  CL 101  --  111 114* 107 107  CO2 22  --  18* 23 23 24   GLUCOSE 121*  --  96 114* 97 85  BUN 20  --  16 10 7 9   CREATININE 1.29*  --  0.93 0.62 0.56 0.61  CALCIUM 8.2*  --  7.9* 8.4* 8.3* 8.2*  MG  --  1.4*  --   --  1.5* 1.9   Liver Function Tests:  Recent Labs Lab 09/30/16 1300 10/03/16 0423  AST 30 26  ALT 26 26  ALKPHOS 103 79  BILITOT 1.1 0.6  PROT 6.3* 5.2*  ALBUMIN 3.2* 2.5*    Recent Labs Lab 09/30/16 1300  LIPASE 24   CBC:  Recent Labs Lab 09/30/16 1300 10/01/16 0500 10/02/16 0420 10/03/16 0423 10/04/16 0400  WBC 1.2* 1.5* 1.8* 2.6* 3.4*  NEUTROABS 0.3*  --  0.8* 1.2* 1.8  HGB 9.4* 8.4* 8.7* 8.7* 8.5*  HCT 27.1* 24.5* 24.7* 25.3* 24.5*  MCV 88.0 88.8 88.8 90.0 90.1  PLT 166 152 193 208 236     IMAGING STUDIES Dg Chest 2 View  Result Date: 09/30/2016 CLINICAL DATA:  Diarrhea for several days. EXAM: CHEST  2 VIEW COMPARISON:  None. FINDINGS: A right Port-A-Cath is in good position. No pneumothorax. The cardiomediastinal silhouette is unremarkable. No nodules or  masses identified on today's study. IMPRESSION: No active cardiopulmonary disease. Electronically Signed   By: Dorise Bullion III M.D   On: 09/30/2016 14:35   Dg Shoulder Right  Result Date: 09/27/2016 CLINICAL DATA:  Pain following fall EXAM: RIGHT SHOULDER - 2+ VIEW COMPARISON:  None. FINDINGS: Frontal, oblique, and Y scapular images were obtained. There is no acute fracture or dislocation. A focus of calcification in the glenohumeral joint may represent prior age uncertain avulsion along the glenoid labrum. There is moderate generalized osteoarthritis. No erosive change. There is bony overgrowth along the inferior distal clavicle. There is mild superior migration of the humeral head. Port-A-Cath tip is in superior vena cava. Visualized right lung is clear.  IMPRESSION: No acute fracture or dislocation. Calcification in the glenohumeral joint may represent age uncertain avulsion from the glenoid labrum. Moderate generalized osteoarthritic change. Superior migration of the humeral head suggests chronic rotator cuff tear. Bony overgrowth along the lateral distal clavicle may lead to so-called impingement syndrome. Electronically Signed   By: Lowella Grip III M.D.   On: 09/27/2016 13:34   Dg Wrist Complete Right  Result Date: 09/27/2016 CLINICAL DATA:  Pain following fall EXAM: RIGHT WRIST - COMPLETE 3+ VIEW COMPARISON:  None. FINDINGS: Frontal, oblique, lateral, and ulnar deviation scaphoid images were obtained. There is no appreciable acute fracture or dislocation. A small focus of calcification is noted in the triangular fibrocartilage region, likely residua of prior trauma. There is osteoarthritic change in the scaphotrapezial and first carpal -metacarpal joints. Other joint spaces appear unremarkable. No erosive change. Pronator quadratus fat pad is not elevated. IMPRESSION: No evident acute fracture or dislocation. Probable old trauma in the triangular fibrocartilage region with focal  calcification. Osteoarthritic change noted in the scaphotrapezial and first carpal -metacarpal joints. Electronically Signed   By: Lowella Grip III M.D.   On: 09/27/2016 13:35   Ct Head Wo Contrast  Result Date: 09/27/2016 CLINICAL DATA:  [place] yesterday hitting the back of the head with laceration, syncope, confusion, history of carcinoma of the colon EXAM: CT HEAD WITHOUT CONTRAST TECHNIQUE: Contiguous axial images were obtained from the base of the skull through the vertex without intravenous contrast. COMPARISON:  CT brain scan of 08/05/2007 FINDINGS: Brain: The ventricular system remains slightly prominent as are the cortical sulci indicating mild atrophy. The septum is midline in position. There is mild small vessel ischemic change throughout the periventricular white matter. No hemorrhage, mass lesion, or acute infarction is seen. Vascular: No vascular abnormality is seen on this unenhanced study. Skull: On bone window images no calvarial abnormality is seen. Sinuses/Orbits: There is mild mucosal thickening in the floor of the right maxillary sinus. No present evidence of sinusitis is seen. Other: None. IMPRESSION: 1. Mild small vessel ischemic change and atrophy. No acute intracranial abnormality. 2. Mild mucosal thickening in the floor of the right maxillary sinus. Electronically Signed   By: Ivar Drape M.D.   On: 09/27/2016 12:58   Ct Abdomen Pelvis W Contrast  Result Date: 10/02/2016 CLINICAL DATA:  The the history of colon cancer and peritoneal carcinomatosis. EXAM: CT ABDOMEN AND PELVIS WITH CONTRAST TECHNIQUE: Multidetector CT imaging of the abdomen and pelvis was performed using the standard protocol following bolus administration of intravenous contrast. CONTRAST:  150mL ISOVUE-300 IOPAMIDOL (ISOVUE-300) INJECTION 61% COMPARISON:  CT scan 07/17/2016 FINDINGS: Lower chest: Chronic basilar lung changes with peritendinosis. No worrisome basilar lung lesions. Small subpleural nodule at the  left lung base on image number 20 measures 4 mm and could be a lymph node. The heart is normal in size. No pericardial effusion. Small hiatal hernia. Hepatobiliary: No mass, inflammation or ductal dilatation. There are gallstones noted the gallbladder along with gallbladder wall thickening and pericholecystic inflammation. Findings could represent acute cholecystitis or peritoneal carcinomatosis. No common bile duct dilatation. Pancreas: No mass, inflammation or duct dilatation. Spleen: Normal size.  Stable small low-attenuation lesions. Adrenals/Urinary Tract: The adrenal glands and kidneys are unremarkable and stable. Prominent extra renal pelvis on the right side but no ureteral or bladder calculi. There is moderate distention of the bladder but no bladder wall thickening. Stomach/Bowel: The stomach, duodenum, small bowel and colon are grossly normal. No acute inflammatory process, mass lesions or obstructive  findings. Vascular/Lymphatic: Stable appearance of the aorta and branch vessels. Minimal atherosclerotic calcifications for age. No aneurysm or dissection. The major venous structures are patent. Reproductive: The uterus is surgically absent. I believe both ovaries are still present and appear normal. Other: The stable surgical changes from a sigmoid colon resection. No evidence of obstruction or recurrent tumor. There are also surgical changes from a partial right hemicolectomy. Slightly smaller area of soft tissue thickening along the suture line likely peritoneal surface disease. Overall this appears relatively stable. Adjacent and just inferior to this area is a stable area of soft tissue thickening in the right retroperitoneum adjacent psoas muscle measuring 6.4 x 3.8 cm. It previously measured 7.2 x 5.1 cm. Multiple other areas of omental and peritoneal surface disease are demonstrated. Multiple omental nodules are noted on the right side. I do not see these portion were on the prior study. The largest  measures 26.5 x 18 mm on image 20. Left-sided omental lesion measures 16 x 13 mm on image number 31. This previously measured 31.5 x 22 mm. Lesion in the root of the small bowel mesentery on image number 46 measures 13.5 x 18.5 mm and previously measured 12.0 x 16 0.0 mm. Musculoskeletal: No significant bony findings. IMPRESSION: Interval necks response is demonstrated. Some areas show definite improvement but there also some new omental nodules on the right side. Gallbladder wall thickening and pericholecystic inflammatory type changes along with gallstones. Could not exclude acute cholecystitis but this also could represent peritoneal surface disease. Recommend clinical correlation with any acute symptoms or physical exam findings. Electronically Signed   By: Marijo Sanes M.D.   On: 10/02/2016 13:21   Dg Knee Complete 4 Views Right  Result Date: 09/27/2016 CLINICAL DATA:  Pain following fall EXAM: RIGHT KNEE - COMPLETE 4+ VIEW COMPARISON:  None. FINDINGS: Frontal, lateral common bile oblique views were obtained. There is no evident fracture or dislocation. There is a small joint effusion. There is moderately severe generalized joint space narrowing with spurring in all compartments. No erosive change. IMPRESSION: Extensive generalized osteoarthritic change. Small joint effusion. No evident fracture or dislocation. Electronically Signed   By: Lowella Grip III M.D.   On: 09/27/2016 13:36    DISCHARGE EXAMINATION: Vitals:   10/03/16 0354 10/03/16 1322 10/03/16 2054 10/04/16 0500  BP: (!) 152/70 136/80 133/85 129/84  Pulse: 83 92 91 80  Resp: 18 16 18 16   Temp: 99 F (37.2 C) 98.4 F (36.9 C) 98.1 F (36.7 C) 97.9 F (36.6 C)  TempSrc: Oral Oral Oral Oral  SpO2: 97% 95% 99% 100%  Weight:      Height:       General appearance: alert, cooperative, appears stated age and no distress Resp: clear to auscultation bilaterally Cardio: regular rate and rhythm, S1, S2 normal, no murmur, click, rub  or gallop GI: soft, non-tender; bowel sounds normal; no masses,  no organomegaly Extremities: extremities normal, atraumatic, no cyanosis or edema   DISPOSITION: Home  Discharge Instructions    Call MD for:  extreme fatigue    Complete by:  As directed    Call MD for:  persistant dizziness or light-headedness    Complete by:  As directed    Call MD for:  persistant nausea and vomiting    Complete by:  As directed    Call MD for:  severe uncontrolled pain    Complete by:  As directed    Call MD for:  temperature >100.4    Complete  by:  As directed    Diet general    Complete by:  As directed    Discharge instructions    Complete by:  As directed    Please be sure to follow-up with her oncologist. Take your medications as prescribed. Seek attention if symptoms recur.  You were cared for by a hospitalist during your hospital stay. If you have any questions about your discharge medications or the care you received while you were in the hospital after you are discharged, you can call the unit and asked to speak with the hospitalist on call if the hospitalist that took care of you is not available. Once you are discharged, your primary care physician will handle any further medical issues. Please note that NO REFILLS for any discharge medications will be authorized once you are discharged, as it is imperative that you return to your primary care physician (or establish a relationship with a primary care physician if you do not have one) for your aftercare needs so that they can reassess your need for medications and monitor your lab values. If you do not have a primary care physician, you can call [phone] for a physician referral.   Increase activity slowly    Complete by:  As directed       ALLERGIES:  Allergies  Allergen Reactions  . Sulfa Antibiotics Swelling    Eyes,mouth  . Amoxicillin-Pot Clavulanate Rash  . Penicillins Swelling and Rash    Has patient had a PCN reaction causing  immediate rash, facial/tongue/throat swelling, SOB or lightheadedness with hypotension: Yes Has patient had a PCN reaction causing severe rash involving mucus membranes or skin necrosis: Unknown Has patient had a PCN reaction that required hospitalization: No Has patient had a PCN reaction occurring within the last 10 years: No If all of the above answers are "NO", then may proceed with Cephalosporin use.      Discharge Medication List as of 10/04/2016 11:51 AM    START taking these medications   Details  cefpodoxime (VANTIN) 200 MG tablet Take 1 tablet (200 mg total) by mouth every 12 (twelve) hours. For 4 more days., Starting Thu 10/04/2016, Until Mon 10/08/2016, Print      CONTINUE these medications which have NOT CHANGED   Details  alendronate (FOSAMAX) 70 MG tablet Take 70 mg by mouth every 7 (seven) days. Sundays. Take with a full glass of water on an empty stomach., Historical Med    amLODipine (NORVASC) 5 MG tablet Take 1 tablet (5 mg total) by mouth daily., Starting Thu 06/21/2016, Print    Ascorbic Acid (VITAMIN C) 1000 MG tablet Take 1,000 mg by mouth daily., Until Discontinued, Historical Med    Biotin 1000 MCG tablet Take 1,000 mcg by mouth daily. , Until Discontinued, Historical Med    CALCIUM & MAGNESIUM CARBONATES PO Take 1 tablet by mouth daily., Until Discontinued, Historical Med    cholecalciferol (VITAMIN D) 1000 UNITS tablet Take 1,000 Units by mouth daily., Until Discontinued, Historical Med    diphenoxylate-atropine (LOMOTIL) 2.5-0.025 MG tablet Take 1-2 tablets by mouth 4 (four) times daily as needed for diarrhea or loose stools., Starting Thu 09/20/2016, Print    fluticasone (FLONASE) 50 MCG/ACT nasal spray Place 1 spray into both nostrils daily., Historical Med    glucosamine-chondroitin 500-400 MG tablet Take 1 tablet by mouth daily. , Until Discontinued, Historical Med    Homeopathic Products ([employer] LEG CRAMPS PAIN RELIEF PO) Take 1 tablet by mouth daily as  needed (for  leg cramps)., Until Discontinued, Historical Med    ibuprofen (ADVIL,MOTRIN) 200 MG tablet Take 200 mg by mouth daily as needed for moderate pain. , Until Discontinued, Historical Med    lidocaine-prilocaine (EMLA) cream Apply to affected area once, Print    loratadine (CLARITIN) 10 MG tablet Take 10 mg by mouth daily., Until Discontinued, Historical Med    Multiple Vitamin (MULTIVITAMIN) tablet Take 1 tablet by mouth daily., Until Discontinued, Historical Med    ondansetron (ZOFRAN) 8 MG tablet Take 1 tablet (8 mg total) by mouth every 8 (eight) hours as needed (Nausea or vomiting)., Starting Thu 01/12/2016, Print    potassium chloride SA (K-DUR,KLOR-CON) 20 MEQ tablet Take 1 tablet (20 mEq total) by mouth daily., Starting Mon 05/02/2015, Print    ranitidine (ZANTAC) 150 MG tablet Take 150 mg by mouth as needed for heartburn., Historical Med    vitamin B-12 (CYANOCOBALAMIN) 1000 MCG tablet Take 1,000 mcg by mouth daily. , Until Discontinued, Historical Med    prochlorperazine (COMPAZINE) 10 MG tablet Take 1 tablet (10 mg total) by mouth every 8 (eight) hours as needed for nausea (Nausea or vomiting)., Starting Thu 09/20/2016, Print         Follow-up Information    Avva, Ravisankar, MD. Call.   Specialty:  Internal Medicine Why:  as needed. Contact information: [address] [place] [address] [phone]        Truitt Merle, MD Follow up.   Specialties:  Hematology, Oncology Why:  see as previously scheduled. Contact information: [address] [place] [address] [phone]           TOTAL DISCHARGE TIME: 35 minutes  [facility] Pager [phone]  10/04/2016, 1:59 PM

## 2016-10-04 NOTE — Discharge Instructions (Signed)
Neutropenic Fever °Neutropenic fever is a type of fever that can develop in someone who has a very low number of a certain kind of white blood cells called neutrophils (neutropenia). These blood cells are important for fighting infections caused by bacteria and fungi. When you have neutropenia, you could be in danger of a severe infection. You may need to start taking antibiotic medicines. °What are the causes? °Neutrophils are made in the spongy tissue inside your bones (bone marrow). Anything that damages your bone marrow or damages neutrophils after they leave your bone marrow can cause neutropenia. Once you have a dangerously low level of neutrophils, you are at risk for infection and neutropenic fever. °Causes of neutropenia may include: °· Cancer treatments. °· Bone marrow cancer. °· Cancer of the white blood cells (leukemia or myeloma). °· Severe infection. °· Bone marrow failure (aplastic anemia). °· Many types of medicines. °· Diseases of the body’s defense system (autoimmune diseases). °· Inherited genes that cause neutropenia. °· Vitamin B deficiency. °· Spleen enlargement in rheumatoid arthritis (Felty syndrome). ° °What are the signs or symptoms? °Fever is the main symptom of neutropenic fever. Other signs and symptoms may include: °· Chills. °· Fatigue. °· Painful mouth ulcers. °· Cough. °· Shortness of breath. °· Swollen glands (lymph nodes). °· Sore throat. °· Sinus and ear infections. °· Gum disease. °· Skin infection. °· Burning and frequent urination. °· Rectal infections. °· Vaginal discharge or itching. ° °How is this diagnosed? °Your health care provider may diagnose neutropenic fever if your neutrophil count is less than 500 neutrophils per microliter of blood and you have a fever of at least 100.4°F (38.0°C). °· Blood tests and other tests that measure neutrophils will be done. These may include: °? A complete blood count (CBC) and a differential white blood count (WBC). °? Peripheral smear.  This test involves checking a blood sample under a microscope. °· Other types of tests may also be done, including: °? Chest X-rays. °? Cultures of blood and body fluids to look for a source of infection. ° °Your health care provider will also determine if your neutropenic fever is high risk or low risk. °· You may have high-risk neutropenic fever if: °? Your neutrophil count is less than 100 neutrophils per microliter of blood. °? You have also been diagnosed with pneumonia or another serious medical problem. °? Your condition requires you to be treated in the hospital. °· You may have low-risk neutropenic fever if: °? Your neutrophil count is more than 100 neutrophils per microliter of blood. °? Your chest X-ray is normal. °? You do not have an active illness or any other problems that require you to be in the hospital. ° °How is this treated? °You may start treatment as soon as you get diagnosed with neutropenic fever, even if your health care provider is still looking for the source of infection. °· Treatment for high-risk neutropenic fever is antibiotic medicine given through an IV access tube. This is done in the hospital. You may be given a single antibiotic or a combination of antibiotics. °· Low-risk neutropenic fever may be treated at home. You may have to take one or two different oral antibiotics. In some cases, you may need to be treated with IV antibiotics that are given by a home health care provider who visits your home. °· If your health care provider finds a specific cause of infection, you may be switched to the antibiotics that work best against those particular bacteria. °· If   a fungal infection is found, your medicine will be changed to an antifungal medicine. °· If the fever goes away in 3-5 days, you may have to take medicine for about 7 days. If the fever is not responding, you may have to take medicine longer. °· You may have to stop taking any medicine that could be causing neutropenic  fever. °· If you have neutropenic fever from cancer treatment drugs (chemotherapy), you may need to take a type of medicine called white blood cell growth factors. This medicine can help prevent fever. ° °Follow these instructions at home: °· Only take medicines as directed by your health care provider. °? If you are being treated with oral antibiotics at home, you may need to return to your health care provider every day to have your CBC checked. You may have to do this until your fever responds. °? Take your antibiotics as directed. Make sure you finish them even if you start to feel better. °· Preventing infection is important when you have neutropenia. Here are some ways to prevent infections: °? Avoid sick friends and family members. °? Wash your hands often. °? Do not eat uncooked or undercooked meats. °? Wash all fruits and vegetables. °? Do not eat or drink unpasteurized dairy products. °? Get regular dental care, and maintain good dental hygiene. °? Get a flu shot. Ask your health care provider whether you need any other vaccines. °? Wear gloves when gardening. °· Follow up with your health care provider as directed. °Contact a health care provider if: °· You have chills. °· You have a fever. °· You have signs or symptoms of infection. °Get help right away if: °· You have trouble breathing. °· You have chest pain. °This information is not intended to replace advice given to you by your health care provider. Make sure you discuss any questions you have with your health care provider. °Document Released: 02/03/2013 Document Revised: 07/07/2015 Document Reviewed: 08/03/2015 °[employer] Interactive Patient Education © 2018 [employer] ° °

## 2016-10-04 NOTE — Progress Notes (Signed)
Patient discharged to home with family, discharge instructions reviewed with patient and daughter who verbalized understanding. New RX given to patient. PAC flushed with NS and Heparin and de accessed.

## 2016-10-05 ENCOUNTER — Telehealth: Payer: Self-pay | Admitting: Hematology

## 2016-10-05 LAB — CULTURE, BLOOD (ROUTINE X 2)
CULTURE: NO GROWTH
Culture: NO GROWTH
Special Requests: ADEQUATE
Special Requests: ADEQUATE

## 2016-10-05 NOTE — Telephone Encounter (Signed)
Spoke with patient regarding their upcoming appts on 9/10.

## 2016-10-11 ENCOUNTER — Ambulatory Visit: Payer: Self-pay | Admitting: Hematology

## 2016-10-11 ENCOUNTER — Other Ambulatory Visit: Payer: Self-pay

## 2016-10-11 ENCOUNTER — Ambulatory Visit: Payer: Self-pay

## 2016-10-18 NOTE — Progress Notes (Signed)
- [place]  [phone] [phone]  Clinic Follow Up Note   Patient Care Team: Prince Solian, MD as PCP - General (Internal Medicine) Gaye Pollack, MD as Consulting Physician (Cardiothoracic Surgery) Gatha Mayer, MD as Consulting Physician (Gastroenterology) Leighton Ruff, MD as Consulting Physician (General Surgery) Tania Ade, RN as Registered Nurse (Medical Oncology) 10/22/2016   CHIEF COMPLAINTS:  Follow-up of colon cancer  Oncology History   Cancer of left colon [facility])   Staging form: Colon and Rectum, AJCC 7th Edition     Pathologic stage from 05/17/2014: Stage IIA (T3, N0, cM0) - Signed by Truitt Merle, MD on 02/21/2015 Cancer of right colon [facility])   Staging form: Colon and Rectum, AJCC 7th Edition     Pathologic stage from 12/08/2014: Stage IIIB (T4a, N1a, cM0) - Signed by Truitt Merle, MD on 02/21/2015       Cancer of right colon ([place])   04/09/2014 Procedure    Colonoscopy per Dr. Silvano Rusk: Large fleshy mass sigmoid colon 25 cm from anus. Could not pass through.      04/09/2014 Tumor Marker    CEA =11.7      04/12/2014 Imaging    CT C/A/P:sigmoid colon mass;several small areas of soft tissue nodularity in peritoneal cavity; small attenuation structure posterior right hepatic lobe; 2 stable nodules in lungs dating back to 2014. No acute findings.      05/17/2014 Initial Diagnosis    Colon cancer-presented with heme + stool and more frequent BMs      05/17/2014 Surgery    Robot assisted sigmoidectomy      05/17/2014 Pathologic Stage    pT3,pN0,pMX--Grade 1--0/13 nodes +--MMR normal      05/17/2014 Clinical Stage    Stage II      12/08/2014 Surgery    right hemicolectomy       12/08/2014 Pathology Results    right colon adenocarcinoma, pT4aN1c, 19 nodes were negative, LVI(+), MMR normal       01/17/2015 - 06/27/2015 Chemotherapy    adjuvant capecitabine 1562m q12h, 2 weeks on and 1 week off. dose reduction and  stopped after 6 cycles due to poor tolerance,       08/15/2015 Progression    CT abdomen showed peritoneal nodules, highly suspicious for recurrent metastasis      10/10/2015 Imaging    PET scan showed multiple enlarging, hypermetabolic peritoneal nodules consistent with peritoneal carcinomatosis. No other evidence of metastasis      11/04/2015 Pathology Results    Diagnosis 11/04/2015 Peritoneum, biopsy, RLQ - METASTATIC COLORECTAL ADENOCARCINOMA. - SEE COMMENT. -[facility]      02/02/2016 Imaging    CT CAP W CONTRAST 02/02/2016 IMPRESSION: 1. Extensive worsening of peritoneal spread of tumor with numerous large new tumor nodules scattered throughout The upper omentum and also scattered within along the mesentery. Mild extension into the right retroperitoneum at the level of the iliac crest. 2. Slight reduction in size of the right upper lobe pulmonary nodule 1.1 cm, previously 1.2 cm. Stable indistinct ground-glass density nodule in the right upper lobe. 3. Airway thickening is present, suggesting bronchitis or reactive airways disease. 4. Ascending aortic aneurysm 4.0 cm in diameter. Recommend annual imaging followup by CTA or MRA. This recommendation follows 2010 ACCF/AHA/AATS/ACR/ASA/SCA/SCAI/SIR/STS/SVM Guidelines for the Diagnosis and Management of Patients with Thoracic Aortic Disease. Circulation. 2010; 121: e266-e369 5. Cholelithiasis.      02/08/2016 -  Chemotherapy    5-fu infusion and Avastin every 2 weeks  04/17/2016 Imaging    CT CAP  IMPRESSION: 1. Slight interval improvement in extensive peritoneal carcinomatosis compared with prior CT from 02/02/2016. No definite disease progression. 2. No evidence of thoracic or hepatic metastases. 3. No definite bowel wall invasion, ascites or bowel obstruction. 4. Cholelithiasis. 5. Stable mild pulmonary nodularity, likely benign.      07/17/2016 Imaging    CT CAP w contrast IMPRESSION: 1. Today's study  demonstrates progressive peritoneal carcinomatosis, as detailed above. Mildly enlarged hepatoduodenal ligament lymph node also noted. 2. No other metastatic disease noted elsewhere in the chest (previously noted right upper lobe pulmonary nodule remains stable over several prior examinations), abdomen or pelvis. 3. Aortic atherosclerosis, with ectasia of ascending thoracic aorta (4.3 cm in diameter). Recommend annual imaging followup by CTA or MRA. This recommendation follows 2010       07/19/2016 - 10/22/2016 Chemotherapy    Irinotecan + bevacizumab Q14d --Cycle #1, 41/32/44: Complicated by protracted neutropenia, grade 2 diarrhea, grade 1 nausea --Cycle #2, 08/09/16: delayed due to neutropenia (ANC 0.4 on 08/02/16) and Avastin every 2 weeks, started on 07/19/2016, irinotecan dose reduced to 140m/m2 from cycle 3, held since 09/30/16 due to neutropenic fever. Last cycle 4 was 09/20/16       09/30/2016 - 10/04/2016 Hospital Admission    Admit date: 09/30/16 Admission diagnosis: Neutropenic Fever secondary to chemo Additional comments: Will hold chemo for 3-4 weeks until she recovers       Chemotherapy    Third line Chemo, Lonsurf for 5 days for 2 weeks on and 2 weeks off.  Start 11/05/16        HISTORY OF PRESENTING ILLNESS:  Heidi [id number].o. female is here because of recently diagnosed a stage II sigmoid colon cancer.  She had annual physical with her PCP in early Feb this year, and stool OB (+), no overt GI bleeding, CBC was normal. She had noticed some loose BM before that, but no nausea, loss of appetite, weight loss or other symoptoms. Colonoscopy showed a large mass in the sigmoid colon 25 cm from anus. Biopsy was taken which showed tubulovillous adenoma, no malignancy. She was referred to surgeon Dr. TMarcello Mooresand underwent robotic-assisted sigmoidectomy on 4/4. She tolerated the procedure well, and was discharged home afterwards.  She has recovered well from the surgery 3  weeks ago, she has mild fatigue, but otherwise doing very well without other complains. She had 2-3 episodes of bleeding after the surgery, which was related to her herromods. She is eating well. 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   She has chronic knee pain she takes ibuprofen once daily.    CURRENT THERAPY: Third line chemotherapy Lonsurf for 5 days, 2 weeks on and 2 weeks off. Will start in 1-2 weeks.    INTERIM HISTORY:  Mrs. WSpinnerreturns for follow-up. She presents to the clinic today with her daughter. She notes to doing alright. She feels a little more strength and able to eat more. Her diarrhea resolved a couple of days after. She denies pain in her stomach. Early last week for 2 days she had low energy, no fever or diarrhea. Her eyesight has gotten weaker. She wants to know if that is due to chemo or her macular degeneration. She is due to see her eye doctor soon.  She does drive and can see well further. The reading does is where she has a problem.    Her husband pasted away  the day after her birthday.     MEDICAL HISTORY:  Past Medical History:  Diagnosis Date  . Adenocarcinoma of colon [facility]) oncologist-  dr Truitt Merle---  last chemo 01-17-2015 to 06-27-2015)   dx 05-17-2014 sigmoid (left) cancer, Stage 2, G1 (pT3N0M0), MSI stable -- s/p  sigmoidectomy /  dx 12-08-2014 ascending colon cancer (right) , Stage 3B, G2, (pT4aN1cM0), MSI stable -- s/p  right hemicolectomy/  08-2015  recurrent peritoneal carcinomatosis  . Arthritis    knees  . Coronary atherosclerosis   . Diverticulosis of colon   . GERD (gastroesophageal reflux disease)   . HH (hiatus hernia)   . History of basal cell carcinoma excision    right side nose 2013  s/p  moh's  . History of epistaxis    2005  caurterized twice  . History of esophageal dilatation    2009 for stricture  . History of esophagitis    reflux  . Internal hemorrhoids   . Macular  degeneration of both eyes   . Multinodular thyroid    remote hx benign bx 2005  . Peritoneal carcinomatosis ([place])    secondary to primary colon cancer  . Pulmonary nodule, right    noted since 2014-- stable per last ct 06-15-2015  . Thoracic ascending aortic aneurysm [facility])    followed by dr Cyndia Bent ([facility])  last ct 06-15-2015  4.3cm    SURGICAL HISTORY: Past Surgical History:  Procedure Laterality Date  . COLONOSCOPY WITH PROPOFOL N/A 09/30/2014   Procedure: COLONOSCOPY WITH PROPOFOL;  Surgeon: Milus Banister, MD;  Location: [facility];  Service: Endoscopy;  Laterality: N/A;  . ESOPHAGOGASTRODUODENOSCOPY  2009  . INGUINAL HERNIA REPAIR Right early 1990s  . MOHS SURGERY  11/2011   nose  . PORTACATH PLACEMENT Right 01/24/2016   Procedure: INSERTION PORT-A-CATH;  Surgeon: Leighton Ruff, MD;  Location: [facility];  Service: General;  Laterality: Right;  . ROBOTIC ASSISTED RIGHT HEMICOLECTOMY  16/11/9602    dr Leighton Ruff  . ROBOTIC ASSISTED SIGMOIDECTOMY  54/10/8117   dr Leighton Ruff  . UPPER GASTROINTESTINAL ENDOSCOPY    . VAGINAL HYSTERECTOMY  age 76   partial with the bladder tack    SOCIAL HISTORY: Social History   Social History  . Marital status: Married    Spouse name: N/A  . Number of children: N/A  . Years of education: N/A   Occupational History  . Not on file.   Social History Main Topics  . Smoking status: Never Smoker  . Smokeless tobacco: Never Used  . Alcohol use No  . Drug use: No  . Sexual activity: Not on file   Other Topics Concern  . Not on file   Social History Narrative   Married, husband is disabled (physical and dementia) and she is caregiver   Has #1 child (daughter) living. Son died of MI at age 96   Worked at [employer] before she retired   Has #3 cats    FAMILY HISTORY: Family History  Problem Relation Age of Onset  . Colon cancer Paternal Grandfather 13       colon cancer   . Stroke Father   . Heart  attack Son 48       died of heart attack   . Esophageal cancer Neg Hx   . Rectal cancer Neg Hx   . Stomach cancer Neg Hx     ALLERGIES:  is allergic to sulfa antibiotics; amoxicillin-pot clavulanate; and penicillins.  MEDICATIONS:  Current  Outpatient Prescriptions  Medication Sig Dispense Refill  . alendronate (FOSAMAX) 70 MG tablet Take 70 mg by mouth every 7 (seven) days. Sundays. Take with a full glass of water on an empty stomach.    Marland Kitchen amLODipine (NORVASC) 5 MG tablet Take 1 tablet (5 mg total) by mouth daily. 30 tablet 2  . Ascorbic Acid (VITAMIN C) 1000 MG tablet Take 1,000 mg by mouth daily.    . Biotin 1000 MCG tablet Take 1,000 mcg by mouth daily.     Marland Kitchen CALCIUM & MAGNESIUM CARBONATES PO Take 1 tablet by mouth daily.    . cholecalciferol (VITAMIN D) 1000 UNITS tablet Take 1,000 Units by mouth daily.    . diphenoxylate-atropine (LOMOTIL) 2.5-0.025 MG tablet Take 1-2 tablets by mouth 4 (four) times daily as needed for diarrhea or loose stools. 30 tablet 2  . fluticasone (FLONASE) 50 MCG/ACT nasal spray Place 1 spray into both nostrils daily.    Marland Kitchen glucosamine-chondroitin 500-400 MG tablet Take 1 tablet by mouth daily.     . Homeopathic Products ([employer] LEG CRAMPS PAIN RELIEF PO) Take 1 tablet by mouth daily as needed (for leg cramps).    Marland Kitchen ibuprofen (ADVIL,MOTRIN) 200 MG tablet Take 200 mg by mouth daily as needed for moderate pain.     Marland Kitchen lidocaine-prilocaine (EMLA) cream Apply to affected area once 30 g 3  . loratadine (CLARITIN) 10 MG tablet Take 10 mg by mouth daily.    . Multiple Vitamin (MULTIVITAMIN) tablet Take 1 tablet by mouth daily.    . ondansetron (ZOFRAN) 8 MG tablet Take 1 tablet (8 mg total) by mouth every 8 (eight) hours as needed (Nausea or vomiting). 30 tablet 1  . potassium chloride SA (K-DUR,KLOR-CON) 20 MEQ tablet Take 1 tablet (20 mEq total) by mouth daily. 20 tablet 1  . prochlorperazine (COMPAZINE) 10 MG tablet Take 1 tablet (10 mg total) by mouth every 8 (eight)  hours as needed for nausea (Nausea or vomiting). 30 tablet 2  . ranitidine (ZANTAC) 150 MG tablet Take 150 mg by mouth as needed for heartburn.    . vitamin B-12 (CYANOCOBALAMIN) 1000 MCG tablet Take 1,000 mcg by mouth daily.     Marland Kitchen trifluridine-tipiracil (LONSURF) 20-8.19 MG tablet Take 3 tablets (60 mg of trifluridine total) by mouth 2 (two) times daily after a meal. 1 hr after AM & PM meals on days 1-5, 8-12. Repeat every 28day 60 tablet 0   No current facility-administered medications for this visit.    Facility-Administered Medications Ordered in Other Visits  Medication Dose Route Frequency Provider Last Rate Last Dose  . sodium chloride flush (NS) 0.9 % injection 10 mL  10 mL Intracatheter PRN Truitt Merle, MD   10 mL at 07/19/16 1515    REVIEW OF SYSTEMS:  Constitutional: Denies fevers, chills or abnormal night sweats (+)fatigue Eyes: Denies blurriness of vision, double vision or watery eyes (+) eyesight weaker Ears, nose, mouth, throat, and face: Denies mucositis or sore throat Respiratory: Denies cough, dyspnea or wheezes Cardiovascular: Denies palpitation, chest discomfort or lower extremity swelling Gastrointestinal:  Denies , heartburn or change in bowel habits (+) frequent bowel movement (+)nausea Skin: Denies abnormal skin rashes Lymphatics: Denies new lymphadenopathy or easy bruising Neurological:Denies numbness, or new weaknesses  Behavioral/Psych: Mood is stable, no new changes  All other systems were reviewed with the patient and are negative.   PHYSICAL EXAMINATION:  ECOG PERFORMANCE STATUS: 1  Vitals:   10/22/16 1340  BP: (!) 158/80  Pulse: 82  Resp: 18  Temp: 98.4 F (36.9 C)  SpO2: 97%   Filed Weights   10/22/16 1340  Weight: 128 lb 11.2 oz (58.4 kg)     GENERAL:alert, no distress and comfortable SKIN: skin color, texture, turgor are normal  or significant lesions   EYES: normal, conjunctiva are pink and non-injected, sclera clear OROPHARYNX:no exudate,  no erythema and lips, buccal mucosa, and tongue normal  NECK: supple, thyroid normal size, non-tender, without nodularity LYMPH:  no palpable lymphadenopathy in the cervical, axillary or inguinal LUNGS: clear to auscultation and percussion with normal breathing effort HEART: regular rate & rhythm and no murmurs and no lower extremity edema ABDOMEN:abdomen soft, surgical incision and to the low abdomen is well healed. non-tender and normal bowel sounds, rectal exam showed internal and external hemorrhoids, no bleeding, stool was green. Musculoskeletal:no cyanosis of digits and no clubbing  PSYCH: alert & oriented x 3 with fluent speech NEURO: no focal motor/sensory deficits   LABORATORY DATA:  I have reviewed the data as listed CBC Latest Ref Rng & Units 10/22/2016 10/04/2016 10/03/2016  WBC 3.9 - 10.3 10e3/uL 6.1 3.4(L) 2.6(L)  Hemoglobin 11.6 - 15.9 g/dL 11.2(L) 8.5(L) 8.7(L)  Hematocrit 34.8 - 46.6 % 35.9 24.5(L) 25.3(L)  Platelets 145 - 400 10e3/uL 186 236 208   CMP Latest Ref Rng & Units 10/22/2016 10/04/2016 10/03/2016  Glucose 70 - 140 mg/dl 96 85 97  BUN 7.0 - 26.0 mg/dL 12.'2 9 7  ' Creatinine 0.6 - 1.1 mg/dL 0.8 0.61 0.56  Sodium 136 - 145 mEq/L 142 138 135  Potassium 3.5 - 5.1 mEq/L 4.4 3.8 4.2  Chloride 101 - 111 mmol/L - 107 107  CO2 22 - 29 mEq/L '28 24 23  ' Calcium 8.4 - 10.4 mg/dL 9.8 8.2(L) 8.3(L)  Total Protein 6.4 - 8.3 g/dL 7.1 - 5.2(L)  Total Bilirubin 0.20 - 1.20 mg/dL 0.62 - 0.6  Alkaline Phos 40 - 150 U/L 148 - 79  AST 5 - 34 U/L 29 - 26  ALT 0 - 55 U/L 14 - 26    CEA 05/02/2015: 2.6 08/15/2015: 9.6 12/16/2015: 253 03/08/2016: 463 04/05/16: 443.6 05/03/16: 347.82 05/31/16: 311.27 07/05/2016: 313.82 08/02/2016: 314.33 09/20/2016: 270.53 10/22/16: PENDING   Pathology report:  Diagnosis 11/04/2015 Peritoneum, biopsy, RLQ - METASTATIC COLORECTAL ADENOCARCINOMA. - SEE COMMENT.  Diagnosis 05/17/2014 1. Colon, segmental resection for tumor, sigmoid, open end proximal -  INVASIVE ADENOCARCINOMA WITH EXTRACELLULAR MUCIN, INVADING THROUGH THE MUSCULARIS PROPRIA INTO PERICOLONIC FATTY TISSUE. - THIRTEEN LYMPH NODES, NEGATIVE FOR METASTATIC CARCINOMA (0/13). - MULTIPLE DIVERTICULA. - RESECTION MARGINS, NEGATIVE FOR ATYPIA OR MALIGNANCY. - PLEASE SEE ONCOLOGY TEMPLATE FOR DETAILS. 2. Colon, resection margin (donut), sigmoid - BENIGN COLONIC MUCOSA, NO EVIDENCE OF DYSPLASIA OR MALIGNANCY.  Diagnosis 12/08/2014  Colon, segmental resection for tumor, right colon MODERATELY DIFFERENTIATED ADENOCARCINOMA OF THE COLON (3.8 CM) THE TUMOR PENETRATES TO THE SURFACE OF THE VISCERAL PERITONEUM (T4A) LYMPHOVASCULAR INVASION IS IDENTIFIED MARGINS OF RESECTION ARE NEGATIVE FOR TUMOR NINTEEN BENIGN LYMPH NODE (0/19) ONE TUMOR DEPOSITE IS IDENTIFIED ([place])   Diagnosis 11/04/2015 Peritoneum, biopsy, RLQ - METASTATIC COLORECTAL ADENOCARCINOMA. - SEE COMMENT  RADIOGRAPHIC STUDIES: I have personally reviewed the radiological images as listed and agreed with the findings in the report.  CT CAP w contrast 10/02/2016 IMPRESSION: Interval necks response is demonstrated. Some areas show definite improvement but there also some new omental nodules on the right side.  Gallbladder wall thickening and pericholecystic inflammatory type changes along with gallstones. Could not exclude acute cholecystitis but this  also could represent peritoneal surface disease. Recommend clinical correlation with any acute symptoms or physical exam findings.    ASSESSMENT & PLAN:  83 y.o. Caucasian female, with past medical history of colon diverticulosis and arthritis, but otherwise healthy and active, who was found to have a large sigmoid tumor, status post sigmoidectomy.  1. Sigmoid colon adenocarcinoma, pT3N0M0, stage II, G1, MSI stable, ascending colon adenocarcinoma, pT4aN1cM0, stage IIIB, MSI stable, G2, KRAS mutation (+), recurrent peritoneal carcinomatosis in 08/2015 -She had  multifocal (2) colon cancer status post complete surgical resection,  -I previously reviewed her recent surgical pathology results in great details with her. The right colon cancer has several high-risk features, including stage IIIB, T4a lesion, tumor deposits, lymphovascular invasion, her risk of colon cancer recurrence is high.  -She tried adjuvant chemotherapy Xeloda, but could not tolerate full dose, and last cycle chemotherapy was canceled due to her tolerance issue. -I previously reviewed her recent PET scan from 10/10/2015, which unfortunately showed multiple enlarging, hypermetabolic peritoneal nodules consistent with peritoneal carcinomatosis. No ascites or other metastasis. -I previously discussed her peritoneal mass biopsy, which confirmed metastatic colon cancer  -We previously discussed that her cancer is not curable at this stage, she is not a candidate for debulking and HIPEC, and her goal of care is palliation.  -Her [facility] genomic testing results were discussed with the patient. She has (+) KRAS mutation, not a candidate for EGFR inhibitor   -Her tumor has MSI stable, not a good candidate for immunotherapy alone  -She has started first-line palliative chemotherapy with 5-FU infusion and Avastin, tolerate treatment better w/o 5-fu bolus -We previously reviewed her staging CT scan from 04/17/2016, which showed partial response of her peritoneal metastasis, no other new lesions. -I previously reviewed her restaging CT scan image from 07/17/2016 with patient and her friend in person. She unfortunately has had disease progression in the peritoneal metastasis. -We previously discussed the option of switching her to second line chemotherapy, versus palliative care alone patient would like to try more chemotherapy.   -she started second line irinotecan and Avastin  -due to her worsening diarrhea, I gave her chemo break after cycle 2, and will reduce Irinotecan dosage to 151m/m2 from  cycle 3 -She was hospitalized on 8/19 for neutropenic fever. I held chemo for 3-4 weeks  -I reviewed her CT scan findings from 10/02/2016, which showed a mixed response, the peritoneum metastasis has decreased in size, however she developed multiple new peritoneum nodules on the right side -I discussed not proceeding with same chemo treatment due to poor toleration and mixed response on scan. I discussed the options of Lonsurf,  regorafenib or palliative care alone,  and I discussed the side effects in great detail. -After a lengthy discussion, she  expressed her wishes to continue treatment,  will proceed with Lonsurf for 5 days for 2 weeks on and 2 weeks off.  -Labs reviewed and CBC and CMP overall within normal limits.  -f/u in 3-4 weeks  2. Peripheral neuropathy, G 1 -From previous chemotherapy Xeloda, mild and stable now.  3. GERD -she'll continue Protonix.  4.Lung nodules  -She has two small right lung nodules which were discovered on the CT scan in 09/2012,  RUL nodule slightly bigger on recent CT -She has been follow-up with Dr. BCyndia Bent  -she never smoked, low risk for lung cancer -given the stability of the RUL nodule over 2 years, this is likely a benign lesion   5. Abdominal Pain and diarrhea  -  She has previously developed some abdominal pain, likely related to diarrhea and gas. Improved lately  -She will take tylenol for the pain -Previously prescribed Tramadol. Take PRN up to 3 times a day.  -We again reviewed the management of diarrhea, she knows to use Imodium and Lomotil as needed, she will use more to control her diarrhea. -Due to her sever diarrhea and last hospitalization we stopped Irinotecan + bevacizumab. Will continue to monitor Bowel Movements.  -resolved now   6. HTN -Slightly elevated previously. No history of HTN, possible related to Avastin  -She will start monitoring and writing down her BP readings at home.  -continue meds   7. Hair Loss - Patient's hair  is starting to thin, secondary to chemo  -OK to use biotin  8. Support/social issue -Her husband's dementia has been progressing and he has become aggressive and dangerous. -I have previously encouraged her to speak to our social worker.  -We previously discussed speaking to his doctor to get medication to help control his aggressive behavior.  -Her daughter would like to put him in a dementia facility. -Her husband pasted 10/02/16.   9. Goal of care discussion  -We again discussed the incurable nature of her cancer, and the overall poor prognosis, especially if she does not have good response to chemotherapy or progress on chemo -The patient understands the goal of care is palliative. -I recommend DNR/DNI, she will think about it   Plan -Stop Irinotecan + bevacizumab Q14d -Order Lonsurf 3 tabs twice daily for day 1-5 and 8-12 every 28 days. Start in 1-2 weeks when she receives medication, she will call us when she receives the medication -lab and f/u in 3-4 weeks    All questions were answered. The patient knows to call the clinic with any problems, questions or concerns.  I spent 20 minutes counseling the patient face to face. The total time spent in the appointment was 25  minutes and more than 50% was on counseling.   I have reviewed the above documentation for accuracy and completeness, and I agree with the above information.      This document serves as a record of services personally performed by Truitt Merle, MD. It was created on her behalf by Joslyn Devon, a trained medical scribe. The creation of this record is based on the scribe's personal observations and the provider's statements to them. This document has been checked and approved by the attending provider.   Truitt Merle 10/22/2016

## 2016-10-22 ENCOUNTER — Other Ambulatory Visit (HOSPITAL_BASED_OUTPATIENT_CLINIC_OR_DEPARTMENT_OTHER): Payer: PPO

## 2016-10-22 ENCOUNTER — Ambulatory Visit (HOSPITAL_BASED_OUTPATIENT_CLINIC_OR_DEPARTMENT_OTHER): Payer: PPO | Admitting: Hematology

## 2016-10-22 VITALS — BP 158/80 | HR 82 | Temp 98.4°F | Resp 18 | Ht 64.0 in | Wt 128.7 lb

## 2016-10-22 DIAGNOSIS — C786 Secondary malignant neoplasm of retroperitoneum and peritoneum: Secondary | ICD-10-CM

## 2016-10-22 DIAGNOSIS — L659 Nonscarring hair loss, unspecified: Secondary | ICD-10-CM | POA: Diagnosis not present

## 2016-10-22 DIAGNOSIS — C186 Malignant neoplasm of descending colon: Secondary | ICD-10-CM

## 2016-10-22 DIAGNOSIS — G620 Drug-induced polyneuropathy: Secondary | ICD-10-CM | POA: Diagnosis not present

## 2016-10-22 DIAGNOSIS — C182 Malignant neoplasm of ascending colon: Secondary | ICD-10-CM

## 2016-10-22 DIAGNOSIS — C187 Malignant neoplasm of sigmoid colon: Secondary | ICD-10-CM

## 2016-10-22 DIAGNOSIS — R109 Unspecified abdominal pain: Secondary | ICD-10-CM | POA: Diagnosis not present

## 2016-10-22 DIAGNOSIS — K219 Gastro-esophageal reflux disease without esophagitis: Secondary | ICD-10-CM | POA: Diagnosis not present

## 2016-10-22 LAB — CBC WITH DIFFERENTIAL/PLATELET
BASO%: 1.1 % (ref 0.0–2.0)
Basophils Absolute: 0.1 10*3/uL (ref 0.0–0.1)
EOS%: 4.9 % (ref 0.0–7.0)
Eosinophils Absolute: 0.3 10*3/uL (ref 0.0–0.5)
HEMATOCRIT: 35.9 % (ref 34.8–46.6)
HEMOGLOBIN: 11.2 g/dL — AB (ref 11.6–15.9)
LYMPH#: 0.8 10*3/uL — AB (ref 0.9–3.3)
LYMPH%: 13 % — ABNORMAL LOW (ref 14.0–49.7)
MCH: 30.4 pg (ref 25.1–34.0)
MCHC: 31.2 g/dL — ABNORMAL LOW (ref 31.5–36.0)
MCV: 97.6 fL (ref 79.5–101.0)
MONO#: 0.6 10*3/uL (ref 0.1–0.9)
MONO%: 10.5 % (ref 0.0–14.0)
NEUT%: 70.5 % (ref 38.4–76.8)
NEUTROS ABS: 4.3 10*3/uL (ref 1.5–6.5)
Platelets: 186 10*3/uL (ref 145–400)
RBC: 3.68 10*6/uL — ABNORMAL LOW (ref 3.70–5.45)
RDW: 16.8 % — AB (ref 11.2–14.5)
WBC: 6.1 10*3/uL (ref 3.9–10.3)

## 2016-10-22 LAB — COMPREHENSIVE METABOLIC PANEL
ALBUMIN: 3.7 g/dL (ref 3.5–5.0)
ALT: 14 U/L (ref 0–55)
AST: 29 U/L (ref 5–34)
Alkaline Phosphatase: 148 U/L (ref 40–150)
Anion Gap: 8 mEq/L (ref 3–11)
BUN: 12.2 mg/dL (ref 7.0–26.0)
CALCIUM: 9.8 mg/dL (ref 8.4–10.4)
CHLORIDE: 106 meq/L (ref 98–109)
CO2: 28 mEq/L (ref 22–29)
CREATININE: 0.8 mg/dL (ref 0.6–1.1)
EGFR: 72 mL/min/{1.73_m2} — ABNORMAL LOW (ref >90)
Glucose: 96 mg/dl (ref 70–140)
Potassium: 4.4 mEq/L (ref 3.5–5.1)
Sodium: 142 mEq/L (ref 136–145)
Total Bilirubin: 0.62 mg/dL (ref 0.20–1.20)
Total Protein: 7.1 g/dL (ref 6.4–8.3)

## 2016-10-22 LAB — UA PROTEIN, DIPSTICK - CHCC: PROTEIN: NEGATIVE mg/dL

## 2016-10-22 LAB — CEA (IN HOUSE-CHCC): CEA (CHCC-In House): 252.79 ng/mL — ABNORMAL HIGH (ref 0.00–5.00)

## 2016-10-22 MED ORDER — TRIFLURIDINE-TIPIRACIL 20-8.19 MG PO TABS: 35.0000 mg/m2 | ORAL_TABLET | Freq: Two times a day (BID) | ORAL | 0 refills | Status: DC

## 2016-10-23 ENCOUNTER — Encounter: Payer: Self-pay | Admitting: Hematology

## 2016-10-25 ENCOUNTER — Telehealth: Payer: Self-pay | Admitting: Pharmacist

## 2016-10-25 DIAGNOSIS — C186 Malignant neoplasm of descending colon: Secondary | ICD-10-CM

## 2016-10-25 MED ORDER — TRIFLURIDINE-TIPIRACIL 20-8.19 MG PO TABS: ORAL_TABLET | ORAL | 0 refills | Status: DC

## 2016-10-25 NOTE — Telephone Encounter (Addendum)
Oral Oncology Pharmacist Encounter  Received new prescription for Lonsurf for the treatment of metastatic colon cancer, Kras mutation positive, after multiple lines of treatment, planned duration until disease progression or unacceptable toxicity.  Labs from 10/22/16 assessed, OK for treatment.  Patient with SCr WNL, due to advanced age and low weight, est CrCl = 40 ml/min. Manufacturer does not provide recommendations for dose reduction for renal function, however, does note patients with moderate impairment (CrCl 30 to 59 mL/minute) may experience greater toxicity and may require dose reduction during treatment.  Current medication list in Epic reviewed, no DDIs with Lonsurf identified.  Prescription has been e-scribed to the [facility] for benefits analysis and approval.  Test claim revealed copayment of $2934.30. Oral Oncology Patient Advocate to reach out to patient for acquisition options prior to providing education to the patient on the medication.  Oral Oncology Clinic will continue to follow for insurance authorization, copayment issues, initial counseling and start date.  Johny Drilling, PharmD, BCPS, BCOP 10/25/2016 11:56 AM Oral Oncology Clinic [phone]

## 2016-10-25 NOTE — Telephone Encounter (Signed)
Oral Oncology Patient Advocate Encounter  Spoke with patient today about the copayment for [place].  The $2934.30 copay is cost prohibitive for her.  She is coming to the [place] on 5/84/83 to sign the application for manufacturer assistance.    [name]. [name], [place] Patient [place] [phone] 10/25/2016 1:36 PM

## 2016-10-31 ENCOUNTER — Telehealth: Payer: Self-pay | Admitting: Pharmacy Technician

## 2016-10-31 NOTE — Telephone Encounter (Signed)
Oral Oncology Patient Advocate Encounter  Met patient in [place] to complete application for [place] [facility] Patient Support in an effort to reduce patient's out of pocket expense for Lonsurf to $0.    Application completed and faxed to [phone].   [place] patient assistance phone number for follow up is [phone].   This encounter will be updated until final determination.   [name]. [name], [place] Patient [facility] [phone] 10/31/2016 2:48 PM

## 2016-11-02 ENCOUNTER — Encounter (HOSPITAL_COMMUNITY): Payer: Self-pay

## 2016-11-02 ENCOUNTER — Emergency Department (HOSPITAL_COMMUNITY): Payer: PPO

## 2016-11-02 ENCOUNTER — Emergency Department (HOSPITAL_COMMUNITY)
Admission: EM | Admit: 2016-11-02 | Discharge: 2016-11-02 | Disposition: A | Discharge status: Home / Self Care | Payer: PPO | Attending: Emergency Medicine | Admitting: Emergency Medicine

## 2016-11-02 DIAGNOSIS — N135 Crossing vessel and stricture of ureter without hydronephrosis: Secondary | ICD-10-CM | POA: Insufficient documentation

## 2016-11-02 DIAGNOSIS — C799 Secondary malignant neoplasm of unspecified site: Secondary | ICD-10-CM | POA: Diagnosis not present

## 2016-11-02 DIAGNOSIS — I2510 Atherosclerotic heart disease of native coronary artery without angina pectoris: Secondary | ICD-10-CM | POA: Insufficient documentation

## 2016-11-02 DIAGNOSIS — R109 Unspecified abdominal pain: Secondary | ICD-10-CM | POA: Diagnosis not present

## 2016-11-02 DIAGNOSIS — N3000 Acute cystitis without hematuria: Secondary | ICD-10-CM | POA: Insufficient documentation

## 2016-11-02 DIAGNOSIS — Z79899 Other long term (current) drug therapy: Secondary | ICD-10-CM | POA: Diagnosis not present

## 2016-11-02 DIAGNOSIS — B9689 Other specified bacterial agents as the cause of diseases classified elsewhere: Secondary | ICD-10-CM | POA: Diagnosis not present

## 2016-11-02 DIAGNOSIS — R1111 Vomiting without nausea: Secondary | ICD-10-CM | POA: Diagnosis not present

## 2016-11-02 DIAGNOSIS — R1031 Right lower quadrant pain: Secondary | ICD-10-CM | POA: Diagnosis present

## 2016-11-02 LAB — URINALYSIS, ROUTINE W REFLEX MICROSCOPIC
BILIRUBIN URINE: NEGATIVE
GLUCOSE, UA: NEGATIVE mg/dL
HGB URINE DIPSTICK: NEGATIVE
Ketones, ur: NEGATIVE mg/dL
Nitrite: NEGATIVE
Protein, ur: NEGATIVE mg/dL
Specific Gravity, Urine: 1.012 (ref 1.005–1.030)
pH: 6 (ref 5.0–8.0)

## 2016-11-02 LAB — COMPREHENSIVE METABOLIC PANEL
ALK PHOS: 117 U/L (ref 38–126)
ALT: 14 U/L (ref 14–54)
AST: 27 U/L (ref 15–41)
Albumin: 4.2 g/dL (ref 3.5–5.0)
Anion gap: 10 (ref 5–15)
BUN: 13 mg/dL (ref 6–20)
CALCIUM: 9.3 mg/dL (ref 8.9–10.3)
CHLORIDE: 103 mmol/L (ref 101–111)
CO2: 25 mmol/L (ref 22–32)
CREATININE: 0.76 mg/dL (ref 0.44–1.00)
GFR calc Af Amer: >60 mL/min (ref >60)
GFR calc non Af Amer: >60 mL/min (ref >60)
Glucose, Bld: 101 mg/dL — ABNORMAL HIGH (ref 65–99)
Potassium: 4 mmol/L (ref 3.5–5.1)
Sodium: 138 mmol/L (ref 135–145)
Total Bilirubin: 1 mg/dL (ref 0.3–1.2)
Total Protein: 6.9 g/dL (ref 6.5–8.1)

## 2016-11-02 LAB — LIPASE, BLOOD: LIPASE: 22 U/L (ref 11–51)

## 2016-11-02 LAB — CBC
HCT: 35.7 % — ABNORMAL LOW (ref 36.0–46.0)
Hemoglobin: 11.7 g/dL — ABNORMAL LOW (ref 12.0–15.0)
MCH: 30.4 pg (ref 26.0–34.0)
MCHC: 32.8 g/dL (ref 30.0–36.0)
MCV: 92.7 fL (ref 78.0–100.0)
PLATELETS: 166 10*3/uL (ref 150–400)
RBC: 3.85 MIL/uL — AB (ref 3.87–5.11)
RDW: 15.6 % — AB (ref 11.5–15.5)
WBC: 8.3 10*3/uL (ref 4.0–10.5)

## 2016-11-02 MED ORDER — HEPARIN SOD (PORK) LOCK FLUSH 100 UNIT/ML IV SOLN
500.0000 [IU] | Freq: Once | INTRAVENOUS | Status: AC
  Administered 2016-11-02: 500 [IU]
  Filled 2016-11-02: qty 5

## 2016-11-02 MED ORDER — SODIUM CHLORIDE 0.9 % IV SOLN
INTRAVENOUS | Status: DC
  Administered 2016-11-02: 10:00:00 via INTRAVENOUS

## 2016-11-02 MED ORDER — ONDANSETRON HCL 4 MG/2ML IJ SOLN
4.0000 mg | Freq: Once | INTRAMUSCULAR | Status: AC
  Administered 2016-11-02: 4 mg via INTRAVENOUS
  Filled 2016-11-02: qty 2

## 2016-11-02 MED ORDER — NITROFURANTOIN MONOHYD MACRO 100 MG PO CAPS: 100.0000 mg | ORAL_CAPSULE | Freq: Two times a day (BID) | ORAL | 0 refills | Status: DC

## 2016-11-02 MED ORDER — MORPHINE SULFATE (PF) 4 MG/ML IV SOLN
4.0000 mg | Freq: Once | INTRAVENOUS | Status: AC
  Administered 2016-11-02: 4 mg via INTRAVENOUS
  Filled 2016-11-02: qty 1

## 2016-11-02 MED ORDER — NITROFURANTOIN MONOHYD MACRO 100 MG PO CAPS
100.0000 mg | ORAL_CAPSULE | Freq: Once | ORAL | Status: AC
  Administered 2016-11-02: 100 mg via ORAL
  Filled 2016-11-02: qty 1

## 2016-11-02 MED ORDER — HYDROCODONE-ACETAMINOPHEN 5-325 MG PO TABS: 1.0000 | ORAL_TABLET | Freq: Four times a day (QID) | ORAL | 0 refills | Status: DC | PRN

## 2016-11-02 NOTE — Discharge Instructions (Signed)
take medications as prescribed, follow-up with your oncologist to review the CT scan findings, return as needed for worsening symptoms

## 2016-11-02 NOTE — ED Notes (Signed)
Colon cancer pt c/o right flank pain with no radiation that has gradually gotten worse onset a month ago. N/v x 3 last night that pt reports looked like bile. Last chemo treatment was 8/9. Denies dysuria or diarrhea.

## 2016-11-02 NOTE — Telephone Encounter (Signed)
Oral Oncology Patient Advocate Encounter  Received notification from [facility] Oncology Patient Support patient has been successfully enrolled into their program to receive Lonsurf from the manufacturer at $0 out of pocket until 02/10/2017.   I called and left a message for the patient. I included, in my message, that re-enrollment will be required for 2019.    In the event that additional follow up with Jeanmarie Plant is required, the phone number is [phone].  Gilmore Laroche, CPhT, [place] Oral Oncology Patient Advocate [phone] 11/02/2016 9:37 AM

## 2016-11-02 NOTE — ED Provider Notes (Signed)
[facility] DEPT Provider Note   CSN: [id number] Arrival date & time: 11/02/16  0554     History   Chief Complaint Chief Complaint  Patient presents with  . Abdominal Pain    HPI Heidi Marquez is a 83 y.o. female.  HPI Patient presents to the emergency room with complaints of right lower quadrant pain.  The patient points to her right inguinal region. She is not having pain in her right upper quadrant as indicated in the nursing notes.  Patient states last evening she started having severe pain in the right inguinal region. She feltlike that area was swollen. It was tender to palpation. She also started having urinary frequency and urgency.  Patient had a few episodes of vomiting last night. She denies any diarrhea or constipation. This morning she is feeling a lot better. She does have a history of colon cancer. Her last chemotherapy treatment was the end of August. She has not been having any pain like this associated with her cancer. Past Medical History:  Diagnosis Date  . Adenocarcinoma of colon [facility]) oncologist-  dr Truitt Merle---  last chemo 01-17-2015 to 06-27-2015)   dx 05-17-2014 sigmoid (left) cancer, Stage 2, G1 (pT3N0M0), MSI stable -- s/p  sigmoidectomy /  dx 12-08-2014 ascending colon cancer (right) , Stage 3B, G2, (pT4aN1cM0), MSI stable -- s/p  right hemicolectomy/  08-2015  recurrent peritoneal carcinomatosis  . Arthritis    knees  . Coronary atherosclerosis   . Diverticulosis of colon   . GERD (gastroesophageal reflux disease)   . HH (hiatus hernia)   . History of basal cell carcinoma excision    right side nose 2013  s/p  moh's  . History of epistaxis    2005  caurterized twice  . History of esophageal dilatation    2009 for stricture  . History of esophagitis    reflux  . Internal hemorrhoids   . Macular degeneration of both eyes   . Multinodular thyroid    remote hx benign bx 2005  . Peritoneal carcinomatosis ([place])    secondary to primary colon  cancer  . Pulmonary nodule, right    noted since 2014-- stable per last ct 06-15-2015  . Thoracic ascending aortic aneurysm [facility])    followed by dr Cyndia Bent ([facility])  last ct 06-15-2015  4.3cm    Patient Active Problem List   Diagnosis Date Noted  . Metastatic colon cancer in female [facility])   . Enteritis 09/30/2016  . Port catheter in place 02/08/2016  . Cancer of left colon ([place]) 02/21/2015  . Diarrhea 02/01/2015  . Hand foot syndrome 02/01/2015  . Anorexia 02/01/2015  . Mucositis due to antineoplastic therapy 02/01/2015  . Encounter for chemotherapy management 12/23/2014  . Cancer of right colon ([place]) 05/17/2014  . Internal hemorrhoids with complication - Grade 3 prolapse 04/07/2014  . Rectocele 04/07/2014  . Pelvic floor dysfunction suspected  04/07/2014  . Nodule of right lung 10/11/2012  . Diverticulosis of colon with hemorrhage 01/24/2012  . ESOPHAGEAL STRICTURE 12/29/2007  . GERD 12/29/2007    Past Surgical History:  Procedure Laterality Date  . COLONOSCOPY WITH PROPOFOL N/A 09/30/2014   Procedure: COLONOSCOPY WITH PROPOFOL;  Surgeon: Milus Banister, MD;  Location: [facility] END[facility];  Service: Endoscopy;  Laterality: N/A;  . ESOPHAGOGASTRODUODENOSCOPY  2009  . INGUINAL HERNIA REPAIR Right early 1990s  . MOHS SURGERY  11/2011   nose  . PORTACATH PLACEMENT Right 01/24/2016   Procedure: INSERTION PORT-A-CATH;  Surgeon: Leighton Ruff, MD;  Location: [facility];  Service: General;  Laterality: Right;  . ROBOTIC ASSISTED RIGHT HEMICOLECTOMY  93/71/6967    dr Leighton Ruff  . ROBOTIC ASSISTED SIGMOIDECTOMY  89/38/1017   dr Leighton Ruff  . UPPER GASTROINTESTINAL ENDOSCOPY    . VAGINAL HYSTERECTOMY  age 31   partial with the bladder tack    OB History    No data available       Home Medications    Prior to Admission medications   Medication Sig Start Date End Date Taking? Authorizing Provider  alendronate (FOSAMAX) 70 MG tablet Take 70 mg by mouth every 7  (seven) days. Sundays. Take with a full glass of water on an empty stomach.    [provider]  amLODipine (NORVASC) 5 MG tablet Take 1 tablet (5 mg total) by mouth daily. 06/21/16   Truitt Merle, MD  Ascorbic Acid (VITAMIN C) 1000 MG tablet Take 1,000 mg by mouth daily.    [provider]  Biotin 1000 MCG tablet Take 1,000 mcg by mouth daily.     [provider]  CALCIUM & MAGNESIUM CARBONATES PO Take 1 tablet by mouth daily.    [provider]  cholecalciferol (VITAMIN D) 1000 UNITS tablet Take 1,000 Units by mouth daily.    [provider]  diphenoxylate-atropine (LOMOTIL) 2.5-0.025 MG tablet Take 1-2 tablets by mouth 4 (four) times daily as needed for diarrhea or loose stools. 09/20/16   Truitt Merle, MD  fluticasone (FLONASE) 50 MCG/ACT nasal spray Place 1 spray into both nostrils daily.    [provider]  glucosamine-chondroitin 500-400 MG tablet Take 1 tablet by mouth daily.     [provider]  Homeopathic Products ([employer] LEG CRAMPS PAIN RELIEF PO) Take 1 tablet by mouth daily as needed (for leg cramps).    [provider]  HYDROcodone-acetaminophen (NORCO/VICODIN) 5-325 MG tablet Take 1 tablet by mouth every 6 (six) hours as needed. 11/02/16   Dorie Rank, MD  ibuprofen (ADVIL,MOTRIN) 200 MG tablet Take 200 mg by mouth daily as needed for moderate pain.     [provider]  lidocaine-prilocaine (EMLA) cream Apply to affected area once 01/12/16   Truitt Merle, MD  loratadine (CLARITIN) 10 MG tablet Take 10 mg by mouth daily.    [provider]  Multiple Vitamin (MULTIVITAMIN) tablet Take 1 tablet by mouth daily.    [provider]  nitrofurantoin, macrocrystal-monohydrate, (MACROBID) 100 MG capsule Take 1 capsule (100 mg total) by mouth 2 (two) times daily. 11/02/16   Dorie Rank, MD  ondansetron (ZOFRAN) 8 MG tablet Take 1 tablet (8 mg total) by mouth every 8 (eight) hours as needed (Nausea or vomiting).  01/12/16   Truitt Merle, MD  potassium chloride SA (K-DUR,KLOR-CON) 20 MEQ tablet Take 1 tablet (20 mEq total) by mouth daily. 05/02/15   Truitt Merle, MD  prochlorperazine (COMPAZINE) 10 MG tablet Take 1 tablet (10 mg total) by mouth every 8 (eight) hours as needed for nausea (Nausea or vomiting). 09/20/16   Truitt Merle, MD  ranitidine (ZANTAC) 150 MG tablet Take 150 mg by mouth as needed for heartburn.    [provider]  trifluridine-tipiracil (LONSURF) 20-8.19 MG tablet Take 3 tabs (23m trifluridine) by mouth twice daily within 1 hr of AM & PM meals on days 1-5 and 8-12, repeat every 28 days 10/25/16   FTruitt Merle MD  vitamin B-12 (CYANOCOBALAMIN) 1000 MCG tablet Take 1,000 mcg by mouth daily.     [provider]    Family History Family History  Problem Relation Age of Onset  . Colon cancer Paternal Grandfather 3       colon cancer   . Stroke Father   . Heart attack Son 59       died of heart attack   . Esophageal cancer Neg Hx   . Rectal cancer Neg Hx   . Stomach cancer Neg Hx     Social History Social History  Substance Use Topics  . Smoking status: Never Smoker  . Smokeless tobacco: Never Used  . Alcohol use No     Allergies   Sulfa antibiotics; Amoxicillin-pot clavulanate; and Penicillins   Review of Systems Review of Systems  All other systems reviewed and are negative.    Physical Exam Updated Vital Signs BP (!) 155/89   Pulse 85   Temp 97.7 F (36.5 C) (Oral)   Resp 15   Ht 1.613 m (5' 3.5")   Wt 58.1 kg (128 lb)   SpO2 94%   BMI 22.32 kg/m   Physical Exam  Constitutional: She appears well-developed and well-nourished. No distress.  HENT:  Head: Normocephalic and atraumatic.  Right Ear: External ear normal.  Left Ear: External ear normal.  Eyes: Conjunctivae are normal. Right eye exhibits no discharge. Left eye exhibits no discharge. No scleral icterus.  Neck: Neck supple. No tracheal deviation present.  Cardiovascular: Normal rate,  regular rhythm and intact distal pulses.   Pulmonary/Chest: Effort normal and breath sounds normal. No stridor. No respiratory distress. She has no wheezes. She has no rales.  Abdominal: Soft. Bowel sounds are normal. She exhibits no distension. There is tenderness in the right lower quadrant. There is no rebound and no guarding. No hernia (well-healed right inguinal hernia scar).  Musculoskeletal: She exhibits no edema or tenderness.  Neurological: She is alert. She has normal strength. No cranial nerve deficit (no facial droop, extraocular movements intact, no slurred speech) or sensory deficit. She exhibits normal muscle tone. She displays no seizure activity. Coordination normal.  Skin: Skin is warm and dry. No rash noted.  Psychiatric: She has a normal mood and affect.  Nursing note and vitals reviewed.    ED Treatments / Results  Labs (all labs ordered are listed, but only abnormal results are displayed) Labs Reviewed  URINALYSIS, ROUTINE W REFLEX MICROSCOPIC - Abnormal; Notable for the following:       Result Value   Leukocytes, UA MODERATE (*)    Bacteria, UA RARE (*)    Squamous Epithelial / LPF 0-5 (*)    Non Squamous Epithelial 0-5 (*)    All other components within normal limits  COMPREHENSIVE METABOLIC PANEL - Abnormal; Notable for the following:    Glucose, Bld 101 (*)    All other components within normal limits  CBC - Abnormal; Notable for the following:    RBC 3.85 (*)    Hemoglobin 11.7 (*)    HCT 35.7 (*)    RDW 15.6 (*)    All other components within normal limits  URINE CULTURE  LIPASE, BLOOD     Radiology Ct Abdomen Pelvis Wo Contrast  Result Date: 11/02/2016 CLINICAL DATA:  Right flank pain worsening over last month. Nausea/vomiting x3 last night. Robot assisted right hemicolectomy and sigmoidectomy in 2016. EXAM: CT ABDOMEN AND PELVIS WITHOUT CONTRAST TECHNIQUE: Multidetector CT imaging of the abdomen and pelvis was performed following the standard  protocol without IV contrast. COMPARISON:  10/02/2016 FINDINGS: Lower chest: Left lower lobe  pulmonary nodule measures 5 mm on image 21 versus 4 mm on the prior. Not readily apparent back on 04/17/2016. Mild cardiomegaly, without pericardial effusion. Tiny hiatal hernia. Hepatobiliary: Normal noncontrast appearance of the liver. Multiple gallstones without acute cholecystitis or biliary duct dilatation. Pancreas: Mild pancreatic atrophy, without duct dilatation. Spleen: Low-density splenic lesions are not significantly changed and of doubtful clinical significance. Adrenals/Urinary Tract: Normal adrenal glands. Moderate right-sided hydroureteronephrosis is new or increased. No ureteric stones. The distal right ureter is difficult to follow, but the level of obstruction is favored to be at the site of soft tissue fullness anterior to the right psoas including on image 44/ series 2. Normal urinary bladder. Stomach/Bowel: Normal remainder of the stomach. Surgical sutures at the rectosigmoid junction. Scattered colonic diverticula. Partial right hemicolectomy. Normal small bowel caliber. Vascular/Lymphatic: Aortic atherosclerosis. A node adjacent the gallbladder measures 1.2 cm on image 22/series 2 versus 1.4 cm on the prior. Small bowel mesenteric node measures 1.3 cm at image 45/series 2 versus 1.4 cm on the prior. Reproductive: Hysterectomy.  No adnexal mass. Other: Mild to moderate pelvic floor laxity. No significant free fluid. Extensive omental/ peritoneal nodularity. Left omental nodule measures 1.4 cm on image 29/series 2 versus 1.6 cm on the prior. A left paracentral omental lesion measures 2.1 cm on image 36/series 2 versus 1.4 cm on the prior exam (when remeasured). There is also of right paracentral omental nodule which is new at 2.0 cm on image 44/series 2. Musculoskeletal: Mild osteopenia.  Lumbosacral spondylosis. IMPRESSION: 1. New or increased moderate right-sided hydroureteronephrosis. This is likely  secondary to soft tissue metastasis positioned anterior to the right psoas muscle. Suboptimally evaluated on this noncontrast exam. 2. Mixed response to therapy otherwise of metastatic disease. Nodes and some peritoneal nodules are decreased in size. However, there are new and enlarged abdominal omental lesions. 3. No bowel obstruction or other gastrointestinal complication. 4. Cholelithiasis. 5. Left lower lobe pulmonary nodule was present on the most recent exam but absent on more remote studies. This is suspicious for an isolated pulmonary metastasis. 6.  Aortic Atherosclerosis (ICD10-I70.0). Electronically Signed   By: Abigail Miyamoto M.D.   On: 11/02/2016 11:53    Procedures Procedures (including critical care time)  Medications Ordered in ED Medications  0.9 %  sodium chloride infusion ( Intravenous New Bag/Given 11/02/16 0949)  ondansetron (ZOFRAN) injection 4 mg (4 mg Intravenous Given 11/02/16 0950)  morphine 4 MG/ML injection 4 mg (4 mg Intravenous Given 11/02/16 1138)  nitrofurantoin (macrocrystal-monohydrate) (MACROBID) capsule 100 mg (100 mg Oral Given 11/02/16 1306)     Initial Impression / Assessment and Plan / ED Course  I have reviewed the triage vital signs and the nursing notes.  Pertinent labs & imaging results that were available during my care of the patient were reviewed by me and considered in my medical decision making (see chart for details).  Clinical Course as of Nov 02 1332  Fri Nov 02, 2016  1237 CT scan shows hydro and possible mass associated with her known ca.   Will consult with urolology  [DG]  3875 Discussed with Dr Jeffie Pollock.  Does not feel pt requires acute intervention, ie stenting right now.  [JK]  1331 I discussed the case with Dr Burr Medico.  Pt has not had a good response to her treatment.  Sx likely related to her cancer.  Will see patient in the office to discuss further treatment, ie urology eval etc.  [JK]    Clinical Course User Index [JK]  Dorie Rank, MD     Patient presented to the emergency room with complaints of abdominal pain. Her workup ultimately revealed worseningmalignancy and hydronephrosis. Discussed the case with Dr. Burr Medico and Dr. Jeffie Pollock.  No signs of severe infection in the emergency room. Patient is feeling better not having any vomiting or significant pain. She is stable for discharge with close outpatient follow-up.  Final Clinical Impressions(s) / ED Diagnoses   Final diagnoses:  Acute cystitis without hematuria  Metastatic cancer (HCC)  Ureteral obstruction    New Prescriptions New Prescriptions   HYDROCODONE-ACETAMINOPHEN (NORCO/VICODIN) 5-325 MG TABLET    Take 1 tablet by mouth every 6 (six) hours as needed.   NITROFURANTOIN, MACROCRYSTAL-MONOHYDRATE, (MACROBID) 100 MG CAPSULE    Take 1 capsule (100 mg total) by mouth 2 (two) times daily.     Dorie Rank, MD 11/02/16 [phone]

## 2016-11-02 NOTE — ED Triage Notes (Signed)
Pt states she woke up and has vomited 3 times and its bile Pt complains of RUQ pain Last chemo was two months ago Daughter states they have been treating her house for fleas with lots of chemicals

## 2016-11-02 NOTE — ED Notes (Signed)
ED Provider at bedside. 

## 2016-11-03 LAB — URINE CULTURE: Culture: NO GROWTH

## 2016-11-05 ENCOUNTER — Telehealth: Payer: Self-pay | Admitting: *Deleted

## 2016-11-05 NOTE — Telephone Encounter (Signed)
Pt states that oral chemo had been approved at no cost to her by the manufacturing co - Lonsurf.

## 2016-11-08 DIAGNOSIS — H524 Presbyopia: Secondary | ICD-10-CM | POA: Diagnosis not present

## 2016-11-08 DIAGNOSIS — E103493 Type 1 diabetes mellitus with severe nonproliferative diabetic retinopathy without macular edema, bilateral: Secondary | ICD-10-CM | POA: Diagnosis not present

## 2016-11-09 NOTE — Telephone Encounter (Signed)
Oral Oncology Patient Advocate Encounter  Patient had additional questions about side effects of Lonsurf.  Heidi Marquez, PharmD at [facility] spoke with her about the side effects and how to manage them.  The patient has the medication and plans to start on Monday, 11-12-16.    [name]. [name], [place] Patient [place] [phone] 11/09/2016 4:01 PM

## 2016-11-16 NOTE — Progress Notes (Signed)
- [place]  [phone] [phone]  Clinic Follow Up Note   Patient Care Team: Prince Solian, MD as PCP - General (Internal Medicine) Gaye Pollack, MD as Consulting Physician (Cardiothoracic Surgery) Gatha Mayer, MD as Consulting Physician (Gastroenterology) Leighton Ruff, MD as Consulting Physician (General Surgery) Tania Ade, RN as Registered Nurse (Medical Oncology) 11/19/2016   CHIEF COMPLAINTS:  Follow-up of colon cancer  Oncology History   Cancer of left colon [facility])   Staging form: Colon and Rectum, AJCC 7th Edition     Pathologic stage from 05/17/2014: Stage IIA (T3, N0, cM0) - Signed by Truitt Merle, MD on 02/21/2015 Cancer of right colon [facility])   Staging form: Colon and Rectum, AJCC 7th Edition     Pathologic stage from 12/08/2014: Stage IIIB (T4a, N1a, cM0) - Signed by Truitt Merle, MD on 02/21/2015       Cancer of right colon ([place])   04/09/2014 Procedure    Colonoscopy per Dr. Silvano Rusk: Large fleshy mass sigmoid colon 25 cm from anus. Could not pass through.      04/09/2014 Tumor Marker    CEA =11.7      04/12/2014 Imaging    CT C/A/P:sigmoid colon mass;several small areas of soft tissue nodularity in peritoneal cavity; small attenuation structure posterior right hepatic lobe; 2 stable nodules in lungs dating back to 2014. No acute findings.      05/17/2014 Initial Diagnosis    Colon cancer-presented with heme + stool and more frequent BMs      05/17/2014 Surgery    Robot assisted sigmoidectomy      05/17/2014 Pathologic Stage    pT3,pN0,pMX--Grade 1--0/13 nodes +--MMR normal      05/17/2014 Clinical Stage    Stage II      12/08/2014 Surgery    right hemicolectomy       12/08/2014 Pathology Results    right colon adenocarcinoma, pT4aN1c, 19 nodes were negative, LVI(+), MMR normal       01/17/2015 - 06/27/2015 Chemotherapy    adjuvant capecitabine 156m q12h, 2 weeks on and 1 week off. dose reduction and  stopped after 6 cycles due to poor tolerance,       08/15/2015 Progression    CT abdomen showed peritoneal nodules, highly suspicious for recurrent metastasis      10/10/2015 Imaging    PET scan showed multiple enlarging, hypermetabolic peritoneal nodules consistent with peritoneal carcinomatosis. No other evidence of metastasis      11/04/2015 Pathology Results    Diagnosis 11/04/2015 Peritoneum, biopsy, RLQ - METASTATIC COLORECTAL ADENOCARCINOMA. - SEE COMMENT. -[facility]      02/02/2016 Imaging    CT CAP W CONTRAST 02/02/2016 IMPRESSION: 1. Extensive worsening of peritoneal spread of tumor with numerous large new tumor nodules scattered throughout The upper omentum and also scattered within along the mesentery. Mild extension into the right retroperitoneum at the level of the iliac crest. 2. Slight reduction in size of the right upper lobe pulmonary nodule 1.1 cm, previously 1.2 cm. Stable indistinct ground-glass density nodule in the right upper lobe. 3. Airway thickening is present, suggesting bronchitis or reactive airways disease. 4. Ascending aortic aneurysm 4.0 cm in diameter. Recommend annual imaging followup by CTA or MRA. This recommendation follows 2010 ACCF/AHA/AATS/ACR/ASA/SCA/SCAI/SIR/STS/SVM Guidelines for the Diagnosis and Management of Patients with Thoracic Aortic Disease. Circulation. 2010; 121: e266-e369 5. Cholelithiasis.      02/08/2016 -  Chemotherapy    5-fu infusion and Avastin every 2 weeks  04/17/2016 Imaging    CT CAP  IMPRESSION: 1. Slight interval improvement in extensive peritoneal carcinomatosis compared with prior CT from 02/02/2016. No definite disease progression. 2. No evidence of thoracic or hepatic metastases. 3. No definite bowel wall invasion, ascites or bowel obstruction. 4. Cholelithiasis. 5. Stable mild pulmonary nodularity, likely benign.      07/17/2016 Imaging    CT CAP w contrast IMPRESSION: 1. Today's study  demonstrates progressive peritoneal carcinomatosis, as detailed above. Mildly enlarged hepatoduodenal ligament lymph node also noted. 2. No other metastatic disease noted elsewhere in the chest (previously noted right upper lobe pulmonary nodule remains stable over several prior examinations), abdomen or pelvis. 3. Aortic atherosclerosis, with ectasia of ascending thoracic aorta (4.3 cm in diameter). Recommend annual imaging followup by CTA or MRA. This recommendation follows 2010       07/19/2016 - 10/22/2016 Chemotherapy    Irinotecan + bevacizumab Q14d --Cycle #1, 84/69/62: Complicated by protracted neutropenia, grade 2 diarrhea, grade 1 nausea --Cycle #2, 08/09/16: delayed due to neutropenia (ANC 0.4 on 08/02/16) and Avastin every 2 weeks, started on 07/19/2016, irinotecan dose reduced to 141m/m2 from cycle 3, held since 09/30/16 due to neutropenic fever. Last cycle 4 was 09/20/16       09/30/2016 - 10/04/2016 Hospital Admission    Admit date: 09/30/16 Admission diagnosis: Neutropenic Fever secondary to chemo Additional comments: Will hold chemo for 3-4 weeks until she recovers      11/12/2016 -  Chemotherapy    Third line Chemo, Lonsurf 3 tabs BID on day 1-5 and 8-12, every 28 days        HISTORY OF PRESENTING ILLNESS:  Heidi GOREN83y.o. female is here because of recently diagnosed a stage II sigmoid colon cancer.  She had annual physical with her PCP in early Feb this year, and stool OB (+), no overt GI bleeding, CBC was normal. She had noticed some loose BM before that, but no nausea, loss of appetite, weight loss or other symoptoms. Colonoscopy showed a large mass in the sigmoid colon 25 cm from anus. Biopsy was taken which showed tubulovillous adenoma, no malignancy. She was referred to surgeon Dr. TMarcello Mooresand underwent robotic-assisted sigmoidectomy on 4/4. She tolerated the procedure well, and was discharged home afterwards.  She has recovered well from the surgery 3 weeks  ago, she has mild fatigue, but otherwise doing very well without other complains. She had 2-3 episodes of bleeding after the surgery, which was related to her herromods. She is eating well. 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   She has chronic knee pain she takes ibuprofen once daily.    CURRENT THERAPY: Third line chemotherapy Lonsurf 3 tabs BID on day 1-5, and 8-12 every 28 days started 11/12/16    INTERIM HISTORY:  Mrs. WGoodellreturns for follow-up. She presents to the clinic today noting while on Lonsurf she has nausea and her BMs have been more on the constipated side. She has taken nausea medication to help. She also notes pain in her right knee along with numbness radiating down her right leg and up her right hip. She is not sure if it is due to her cancer or her arthritis. It was discussed her knee pain is not likely related to her Lonsurf. This pain is new in the last 6 weeks and wakes her up in the middle of the night. She has taken oxycodone for the pain. The first oxycodone made her "  woosy" so she is taking half tablets 3 times a day. She tried tylenol #3 but was not strong enough. She did a CT AP scan last month and there was no significant bone change. She will see her orthopedic surgeon within the next month.  She reports her urine is normal.  She notes this morning she develops a scratchy throat that makes her voice unclear. Upon Exam her throat was clear but she will gargle salt water. She opted for flu shot today.     MEDICAL HISTORY:  Past Medical History:  Diagnosis Date  . Adenocarcinoma of colon [facility]) oncologist-  dr Truitt Merle---  last chemo 01-17-2015 to 06-27-2015)   dx 05-17-2014 sigmoid (left) cancer, Stage 2, G1 (pT3N0M0), MSI stable -- s/p  sigmoidectomy /  dx 12-08-2014 ascending colon cancer (right) , Stage 3B, G2, (pT4aN1cM0), MSI stable -- s/p  right hemicolectomy/  08-2015  recurrent peritoneal carcinomatosis  .  Arthritis    knees  . Coronary atherosclerosis   . Diverticulosis of colon   . GERD (gastroesophageal reflux disease)   . HH (hiatus hernia)   . History of basal cell carcinoma excision    right side nose 2013  s/p  moh's  . History of epistaxis    2005  caurterized twice  . History of esophageal dilatation    2009 for stricture  . History of esophagitis    reflux  . Internal hemorrhoids   . Macular degeneration of both eyes   . Multinodular thyroid    remote hx benign bx 2005  . Peritoneal carcinomatosis ([place])    secondary to primary colon cancer  . Pulmonary nodule, right    noted since 2014-- stable per last ct 06-15-2015  . Thoracic ascending aortic aneurysm [facility])    followed by dr Cyndia Bent ([facility])  last ct 06-15-2015  4.3cm    SURGICAL HISTORY: Past Surgical History:  Procedure Laterality Date  . COLONOSCOPY WITH PROPOFOL N/A 09/30/2014   Procedure: COLONOSCOPY WITH PROPOFOL;  Surgeon: Milus Banister, MD;  Location: [facility];  Service: Endoscopy;  Laterality: N/A;  . ESOPHAGOGASTRODUODENOSCOPY  2009  . INGUINAL HERNIA REPAIR Right early 1990s  . MOHS SURGERY  11/2011   nose  . PORTACATH PLACEMENT Right 01/24/2016   Procedure: INSERTION PORT-A-CATH;  Surgeon: Leighton Ruff, MD;  Location: [facility];  Service: General;  Laterality: Right;  . ROBOTIC ASSISTED RIGHT HEMICOLECTOMY  19/62/2297    dr Leighton Ruff  . ROBOTIC ASSISTED SIGMOIDECTOMY  98/92/1194   dr Leighton Ruff  . UPPER GASTROINTESTINAL ENDOSCOPY    . VAGINAL HYSTERECTOMY  age 59   partial with the bladder tack    SOCIAL HISTORY: Social History   Social History  . Marital status: Married    Spouse name: N/A  . Number of children: N/A  . Years of education: N/A   Occupational History  . Not on file.   Social History Main Topics  . Smoking status: Never Smoker  . Smokeless tobacco: Never Used  . Alcohol use No  . Drug use: No  . Sexual activity: Not on file   Other Topics  Concern  . Not on file   Social History Narrative   Married, husband is disabled (physical and dementia) and she is caregiver   Has #1 child (daughter) living. Son died of MI at age 38   Worked at [employer] before she retired   Has #3 cats    FAMILY HISTORY: Family History  Problem Relation  Age of Onset  . Colon cancer Paternal Grandfather 52       colon cancer   . Stroke Father   . Heart attack Son 16       died of heart attack   . Esophageal cancer Neg Hx   . Rectal cancer Neg Hx   . Stomach cancer Neg Hx     ALLERGIES:  is allergic to sulfa antibiotics; amoxicillin-pot clavulanate; and penicillins.  MEDICATIONS:  Current Outpatient Prescriptions  Medication Sig Dispense Refill  . alendronate (FOSAMAX) 70 MG tablet Take 70 mg by mouth every 7 (seven) days. Sundays. Take with a full glass of water on an empty stomach.    Marland Kitchen amLODipine (NORVASC) 5 MG tablet Take 1 tablet (5 mg total) by mouth daily. 30 tablet 2  . Ascorbic Acid (VITAMIN C) 1000 MG tablet Take 1,000 mg by mouth daily.    . Biotin 1000 MCG tablet Take 1,000 mcg by mouth daily.     Marland Kitchen CALCIUM & MAGNESIUM CARBONATES PO Take 1 tablet by mouth daily.    . cholecalciferol (VITAMIN D) 1000 UNITS tablet Take 1,000 Units by mouth daily.    . diphenoxylate-atropine (LOMOTIL) 2.5-0.025 MG tablet Take 1-2 tablets by mouth 4 (four) times daily as needed for diarrhea or loose stools. 30 tablet 2  . fluticasone (FLONASE) 50 MCG/ACT nasal spray Place 1 spray into both nostrils daily.    Marland Kitchen glucosamine-chondroitin 500-400 MG tablet Take 1 tablet by mouth daily.     . Homeopathic Products ([employer] LEG CRAMPS PAIN RELIEF PO) Take 1 tablet by mouth daily as needed (for leg cramps).    Marland Kitchen HYDROcodone-acetaminophen (NORCO/VICODIN) 5-325 MG tablet Take 1 tablet by mouth every 6 (six) hours as needed. 60 tablet 0  . ibuprofen (ADVIL,MOTRIN) 200 MG tablet Take 200 mg by mouth daily as needed for moderate pain.     Marland Kitchen  lidocaine-prilocaine (EMLA) cream Apply to affected area once 30 g 3  . loratadine (CLARITIN) 10 MG tablet Take 10 mg by mouth daily.    . Multiple Vitamin (MULTIVITAMIN) tablet Take 1 tablet by mouth daily.    . nitrofurantoin, macrocrystal-monohydrate, (MACROBID) 100 MG capsule Take 1 capsule (100 mg total) by mouth 2 (two) times daily. 10 capsule 0  . ondansetron (ZOFRAN) 8 MG tablet Take 1 tablet (8 mg total) by mouth every 8 (eight) hours as needed (Nausea or vomiting). 30 tablet 1  . potassium chloride SA (K-DUR,KLOR-CON) 20 MEQ tablet Take 1 tablet (20 mEq total) by mouth daily. 20 tablet 1  . prochlorperazine (COMPAZINE) 10 MG tablet Take 1 tablet (10 mg total) by mouth every 8 (eight) hours as needed for nausea (Nausea or vomiting). 30 tablet 2  . ranitidine (ZANTAC) 150 MG tablet Take 150 mg by mouth as needed for heartburn.    . trifluridine-tipiracil (LONSURF) 20-8.19 MG tablet Take 3 tabs (62m trifluridine) by mouth twice daily within 1 hr of AM & PM meals on days 1-5 and 8-12, repeat every 28 days 60 tablet 0  . vitamin B-12 (CYANOCOBALAMIN) 1000 MCG tablet Take 1,000 mcg by mouth daily.      No current facility-administered medications for this visit.    Facility-Administered Medications Ordered in Other Visits  Medication Dose Route Frequency Provider Last Rate Last Dose  . sodium chloride flush (NS) 0.9 % injection 10 mL  10 mL Intracatheter PRN FTruitt Merle MD   10 mL at 07/19/16 1515    REVIEW OF SYSTEMS:  Constitutional: Denies fevers,  chills or abnormal night sweats (+)fatigue (+) low appetite (+) weight loss  Eyes: Denies blurriness of vision, double vision or watery eyes  Ears, nose, mouth, throat, and face: Denies mucositis (+) scratchy throat Respiratory: Denies cough, dyspnea or wheezes Cardiovascular: Denies palpitation, chest discomfort or lower extremity swelling Gastrointestinal:  Denies heartburn  (+)nausea (+) slight constipation  Skin: Denies abnormal skin  rashes MSK: (+) right knee and hip pain/numbness  Lymphatics: Denies new lymphadenopathy or easy bruising Neurological:Denies numbness, or new weaknesses  Behavioral/Psych: Mood is stable, no new changes  All other systems were reviewed with the patient and are negative.   PHYSICAL EXAMINATION:  ECOG PERFORMANCE STATUS: 1  Vitals:   11/19/16 1031  BP: (!) 149/82  Pulse: 85  Resp: 18  Temp: 98 F (36.7 C)  SpO2: 98%   Filed Weights   11/19/16 1031  Weight: 123 lb 4.8 oz (55.9 kg)     GENERAL:alert, no distress and comfortable SKIN: skin color, texture, turgor are normal  or significant lesions   EYES: normal, conjunctiva are pink and non-injected, sclera clear OROPHARYNX:no exudate, no erythema and lips, buccal mucosa, and tongue normal  NECK: supple, thyroid normal size, non-tender, without nodularity LYMPH:  no palpable lymphadenopathy in the cervical, axillary or inguinal LUNGS: clear to auscultation and percussion with normal breathing effort HEART: regular rate & rhythm and no murmurs and no lower extremity edema ABDOMEN: (+) abdomen soft, surgical incision and to the low abdomen is well healed. Tenderness in RLQ and normal bowel sounds, (+) previous rectal exam showed internal and external hemorrhoids, no bleeding, stool was green.  Musculoskeletal:no cyanosis of digits and no clubbing  PSYCH: alert & oriented x 3 with fluent speech NEURO: no focal motor/sensory deficits   LABORATORY DATA:  I have reviewed the data as listed CBC Latest Ref Rng & Units 11/19/2016 11/02/2016 10/22/2016  WBC 3.9 - 10.3 10e3/uL 6.8 8.3 6.1  Hemoglobin 11.6 - 15.9 g/dL 11.2(L) 11.7(L) 11.2(L)  Hematocrit 34.8 - 46.6 % 34.4(L) 35.7(L) 35.9  Platelets 145 - 400 10e3/uL 145 166 186   CMP Latest Ref Rng & Units 11/19/2016 11/02/2016 10/22/2016  Glucose 70 - 140 mg/dl 114 101(H) 96  BUN 7.0 - 26.0 mg/dL 21.4 13 12.2  Creatinine 0.6 - 1.1 mg/dL 1.3(H) 0.76 0.8  Sodium 136 - 145 mEq/L 139 138 142   Potassium 3.5 - 5.1 mEq/L 3.9 4.0 4.4  Chloride 101 - 111 mmol/L - 103 -  CO2 22 - 29 mEq/L _0 Calcium 8.4 - 10.4 mg/dL 9.3 9.3 9.8  Total Protein 6.4 - 8.3 g/dL 6.7 6.9 7.1  Total Bilirubin 0.20 - 1.20 mg/dL 1.07 1.0 0.62  Alkaline Phos 40 - 150 U/L 100 117 148  AST 5 - 34 U/L _1 ALT 0 - 55 U/L _2 CEA 05/02/2015: 2.6 08/15/2015: 9.6 12/16/2015: 253 03/08/2016: 463 04/05/16: 443.6 05/03/16: 347.82 05/31/16: 311.27 07/05/2016: 313.82 08/02/2016: 314.33 09/20/2016: 270.53 10/22/16: 252.79 11/19/16: PENDING   Pathology report:  Diagnosis 11/04/2015 Peritoneum, biopsy, RLQ - METASTATIC COLORECTAL ADENOCARCINOMA. - SEE COMMENT.  Diagnosis 05/17/2014 1. Colon, segmental resection for tumor, sigmoid, open end proximal - INVASIVE ADENOCARCINOMA WITH EXTRACELLULAR MUCIN, INVADING THROUGH THE MUSCULARIS PROPRIA INTO PERICOLONIC FATTY TISSUE. - THIRTEEN LYMPH NODES, NEGATIVE FOR METASTATIC CARCINOMA (0/13). - MULTIPLE DIVERTICULA. - RESECTION MARGINS, NEGATIVE FOR ATYPIA OR MALIGNANCY. - PLEASE SEE ONCOLOGY TEMPLATE FOR DETAILS. 2. Colon, resection margin (donut), sigmoid - BENIGN COLONIC MUCOSA, NO EVIDENCE  OF DYSPLASIA OR MALIGNANCY.  Diagnosis 12/08/2014  Colon, segmental resection for tumor, right colon MODERATELY DIFFERENTIATED ADENOCARCINOMA OF THE COLON (3.8 CM) THE TUMOR PENETRATES TO THE SURFACE OF THE VISCERAL PERITONEUM (T4A) LYMPHOVASCULAR INVASION IS IDENTIFIED MARGINS OF RESECTION ARE NEGATIVE FOR TUMOR NINTEEN BENIGN LYMPH NODE (0/19) ONE TUMOR DEPOSITE IS IDENTIFIED ([place])   Diagnosis 11/04/2015 Peritoneum, biopsy, RLQ - METASTATIC COLORECTAL ADENOCARCINOMA. - SEE COMMENT  RADIOGRAPHIC STUDIES: I have personally reviewed the radiological images as listed and agreed with the findings in the report.  CT CAP w contrast 10/02/2016 IMPRESSION: Interval necks response is demonstrated. Some areas show definite improvement but there also some new  omental nodules on the right side.  Gallbladder wall thickening and pericholecystic inflammatory type changes along with gallstones. Could not exclude acute cholecystitis but this also could represent peritoneal surface disease. Recommend clinical correlation with any acute symptoms or physical exam findings.    ASSESSMENT & PLAN:  83 y.o. Caucasian female, with past medical history of colon diverticulosis and arthritis, but otherwise healthy and active, who was found to have a large sigmoid tumor, status post sigmoidectomy.  1. Sigmoid colon adenocarcinoma, pT3N0M0, stage II, G1, MSI stable, ascending colon adenocarcinoma, pT4aN1cM0, stage IIIB, MSI stable, G2, KRAS mutation (+), recurrent peritoneal carcinomatosis in 08/2015 -She had multifocal (2) colon cancer status post complete surgical resection,  -I previously reviewed her recent surgical pathology results in great details with her. The right colon cancer has several high-risk features, including stage IIIB, T4a lesion, tumor deposits, lymphovascular invasion, her risk of colon cancer recurrence is high.  -She tried adjuvant chemotherapy Xeloda, but could not tolerate full dose, and last cycle chemotherapy was canceled due to her tolerance issue. -I previously reviewed her recent PET scan from 10/10/2015, which unfortunately showed multiple enlarging, hypermetabolic peritoneal nodules consistent with peritoneal carcinomatosis. No ascites or other metastasis. -I previously discussed her peritoneal mass biopsy, which confirmed metastatic colon cancer  -We previously discussed that her cancer is not curable at this stage, she is not a candidate for debulking and HIPEC, and her goal of care is palliation.  -Her [facility] genomic testing results were discussed with the patient. She has (+) KRAS mutation, not a candidate for EGFR inhibitor   -Her tumor has MSI stable, not a good candidate for immunotherapy alone  -She has started  first-line palliative chemotherapy with 5-FU infusion and Avastin, tolerate treatment better w/o 5-fu bolus -We previously reviewed her staging CT scan from 04/17/2016, which showed partial response of her peritoneal metastasis, no other new lesions. -I previously reviewed her restaging CT scan image from 07/17/2016 with patient and her friend in person. She unfortunately has had disease progression in the peritoneal metastasis. --I reviewed her CT scan findings from 10/02/2016, which showed a mixed response to Irinotecan and Avastin, the peritoneum metastasis has decreased in size, however she developed multiple new peritoneum nodules on the right side -she is now on third line Lonsurf. She tolerates Lonsurf moderately well other than nausea and slight constipation and weight loss. I suggest she precedes her lonsurf with anti-nausea medication and continues as needed during her weeks on Lonsurf. I recommend she takes ensure boost. I suggest 1-3 a day if she is not eating much.  -Labs reviewed and Cr. 1.3 and Hg is 11.2. I recommend she watches her kidney function, such as urine output and urine color and to drink plenty of warter. Her prior scan showed right hydronephrosis secondary to peritoneal implants. I will refer her to  Urologist -f/u on 10/25  2. Peripheral neuropathy, G 1 -From previous chemotherapy Xeloda, mild and stable now.  3. GERD -she'll continue Protonix.  4. Lung nodules  -She has two small right lung nodules which were discovered on the CT scan in 09/2012,  RUL nodule slightly bigger on recent CT -She has been follow-up with Dr. Cyndia Bent.  -she never smoked, low risk for lung cancer -given the stability of the RUL nodule over 2 years, this is likely a benign lesion   5. HTN  -Slightly elevated previously. No history of HTN, possible related to Avastin  -She will start monitoring and writing down her BP readings at home.  -continue meds   6. Nausea and weight loss -Secondary to  chemotherapy -I recommend her to take Compazine or Zofran before each dose of Lonsurf for nausea  -I encouraged her to try nutritional supplement, such as ensure no posterior, for her weight loss   7. Right low back and leg pain  -Onset in the last 6 weeks as of 11/19/16 -Possibly related to her peritoneal carcinomatosis, her recent CT scan a few weeks ago did not show any significant bone metastasis -I will refill her Hydrocodone today (11/19/16)  8. AKI secondary to right hydronephrosis -Cr elevation to 1.3 on 10/8 which is likely related to her tumor blocking her kidney.  -I recommend she watches her kidney function, such as urine output and urin color and drinks plenty of water.  -I suggest a cystoscopy for possible ureteral stent placement. I will refer her to [facility].    9. Support/social issue -Her husband's dementia has been progressing and he has become aggressive and dangerous. -I have previously encouraged her to speak to our social worker.  -We previously discussed speaking to his doctor to get medication to help control his aggressive behavior.  -Her daughter would like to put her husband in a dementia facility. -Her husband passed 10/02/16.   10. Goal of care discussion  -We again discussed the incurable nature of her cancer, and the overall poor prognosis, especially if she does not have good response to chemotherapy or progress on chemo -The patient understands the goal of care is palliative. -I recommend DNR/DNI, she will think about it   Plan -She will continue LOnsurf week 2 -Refill hydrocortisone today  -Flu shot today -Urology referral for AKI secondary to right hydronephrosis -Lab and f/u on 10/25    All questions were answered. The patient knows to call the clinic with any problems, questions or concerns.  I spent 20 minutes counseling the patient face to face. The total time spent in the appointment was 25  minutes and more than 50% was on  counseling.   I have reviewed the above documentation for accuracy and completeness, and I agree with the above information.      This document serves as a record of services personally performed by Truitt Merle, MD. It was created on her behalf by Joslyn Devon, a trained medical scribe. The creation of this record is based on the scribe's personal observations and the provider's statements to them. This document has been checked and approved by the attending provider.   Truitt Merle 11/19/2016

## 2016-11-19 ENCOUNTER — Telehealth: Payer: Self-pay | Admitting: Hematology

## 2016-11-19 ENCOUNTER — Other Ambulatory Visit (HOSPITAL_BASED_OUTPATIENT_CLINIC_OR_DEPARTMENT_OTHER): Payer: PPO

## 2016-11-19 ENCOUNTER — Ambulatory Visit (HOSPITAL_BASED_OUTPATIENT_CLINIC_OR_DEPARTMENT_OTHER): Payer: PPO | Admitting: Hematology

## 2016-11-19 ENCOUNTER — Encounter: Payer: Self-pay | Admitting: Hematology

## 2016-11-19 VITALS — BP 149/82 | HR 85 | Temp 98.0°F | Resp 18 | Ht 63.5 in | Wt 123.3 lb

## 2016-11-19 DIAGNOSIS — N2889 Other specified disorders of kidney and ureter: Secondary | ICD-10-CM | POA: Diagnosis not present

## 2016-11-19 DIAGNOSIS — C182 Malignant neoplasm of ascending colon: Secondary | ICD-10-CM | POA: Diagnosis not present

## 2016-11-19 DIAGNOSIS — R634 Abnormal weight loss: Secondary | ICD-10-CM

## 2016-11-19 DIAGNOSIS — M545 Low back pain: Secondary | ICD-10-CM | POA: Diagnosis not present

## 2016-11-19 DIAGNOSIS — C187 Malignant neoplasm of sigmoid colon: Secondary | ICD-10-CM

## 2016-11-19 DIAGNOSIS — C186 Malignant neoplasm of descending colon: Secondary | ICD-10-CM

## 2016-11-19 DIAGNOSIS — C786 Secondary malignant neoplasm of retroperitoneum and peritoneum: Secondary | ICD-10-CM

## 2016-11-19 DIAGNOSIS — R110 Nausea: Secondary | ICD-10-CM

## 2016-11-19 DIAGNOSIS — G620 Drug-induced polyneuropathy: Secondary | ICD-10-CM | POA: Diagnosis not present

## 2016-11-19 DIAGNOSIS — Z23 Encounter for immunization: Secondary | ICD-10-CM

## 2016-11-19 DIAGNOSIS — N1339 Other hydronephrosis: Secondary | ICD-10-CM

## 2016-11-19 LAB — COMPREHENSIVE METABOLIC PANEL
ALK PHOS: 100 U/L (ref 40–150)
ALT: 11 U/L (ref 0–55)
AST: 23 U/L (ref 5–34)
Albumin: 3.5 g/dL (ref 3.5–5.0)
Anion Gap: 7 mEq/L (ref 3–11)
BUN: 21.4 mg/dL (ref 7.0–26.0)
CALCIUM: 9.3 mg/dL (ref 8.4–10.4)
CO2: 28 mEq/L (ref 22–29)
CREATININE: 1.3 mg/dL — AB (ref 0.6–1.1)
Chloride: 104 mEq/L (ref 98–109)
EGFR: 40 mL/min/{1.73_m2} — ABNORMAL LOW (ref >90)
Glucose: 114 mg/dl (ref 70–140)
Potassium: 3.9 mEq/L (ref 3.5–5.1)
Sodium: 139 mEq/L (ref 136–145)
Total Bilirubin: 1.07 mg/dL (ref 0.20–1.20)
Total Protein: 6.7 g/dL (ref 6.4–8.3)

## 2016-11-19 LAB — CBC WITH DIFFERENTIAL/PLATELET
BASO%: 0.6 % (ref 0.0–2.0)
Basophils Absolute: 0 10*3/uL (ref 0.0–0.1)
EOS%: 1.8 % (ref 0.0–7.0)
Eosinophils Absolute: 0.1 10*3/uL (ref 0.0–0.5)
HEMATOCRIT: 34.4 % — AB (ref 34.8–46.6)
HEMOGLOBIN: 11.2 g/dL — AB (ref 11.6–15.9)
LYMPH#: 0.4 10*3/uL — AB (ref 0.9–3.3)
LYMPH%: 6.3 % — ABNORMAL LOW (ref 14.0–49.7)
MCH: 30.4 pg (ref 25.1–34.0)
MCHC: 32.7 g/dL (ref 31.5–36.0)
MCV: 93.2 fL (ref 79.5–101.0)
MONO#: 0.2 10*3/uL (ref 0.1–0.9)
MONO%: 3.6 % (ref 0.0–14.0)
NEUT#: 5.9 10*3/uL (ref 1.5–6.5)
NEUT%: 87.7 % — AB (ref 38.4–76.8)
Platelets: 145 10*3/uL (ref 145–400)
RBC: 3.69 10*6/uL — ABNORMAL LOW (ref 3.70–5.45)
RDW: 15.3 % — AB (ref 11.2–14.5)
WBC: 6.8 10*3/uL (ref 3.9–10.3)

## 2016-11-19 LAB — UA PROTEIN, DIPSTICK - CHCC

## 2016-11-19 LAB — CEA (IN HOUSE-CHCC): CEA (CHCC-IN HOUSE): 410.17 ng/mL — AB (ref 0.00–5.00)

## 2016-11-19 MED ORDER — HYDROCODONE-ACETAMINOPHEN 5-325 MG PO TABS: 1.0000 | ORAL_TABLET | Freq: Four times a day (QID) | ORAL | 0 refills | Status: DC | PRN

## 2016-11-19 MED ORDER — INFLUENZA VAC SPLIT QUAD 0.5 ML IM SUSY
0.5000 mL | PREFILLED_SYRINGE | Freq: Once | INTRAMUSCULAR | Status: AC
  Administered 2016-11-19: 0.5 mL via INTRAMUSCULAR
  Filled 2016-11-19: qty 0.5

## 2016-11-19 NOTE — Telephone Encounter (Signed)
Gave avs and calendar for October  °

## 2016-11-21 ENCOUNTER — Telehealth: Payer: Self-pay | Admitting: *Deleted

## 2016-11-21 ENCOUNTER — Other Ambulatory Visit: Payer: Self-pay | Admitting: *Deleted

## 2016-11-21 DIAGNOSIS — C186 Malignant neoplasm of descending colon: Secondary | ICD-10-CM

## 2016-11-21 MED ORDER — TRIFLURIDINE-TIPIRACIL 20-8.19 MG PO TABS: ORAL_TABLET | ORAL | 0 refills | Status: AC

## 2016-11-21 NOTE — Telephone Encounter (Signed)
Spoke with pt and was informed that pt will complete her cycle of Lonsurf on Friday  11/23/16.  Stated she will need refill. Dr. Burr Medico notified.  Faxed signed script by Dr. Burr Medico to  [facility] as per pt's request. [place]    Fax    [phone].

## 2016-11-27 ENCOUNTER — Other Ambulatory Visit: Payer: Self-pay | Admitting: Hematology

## 2016-11-27 DIAGNOSIS — C186 Malignant neoplasm of descending colon: Secondary | ICD-10-CM

## 2016-11-27 DIAGNOSIS — C182 Malignant neoplasm of ascending colon: Secondary | ICD-10-CM

## 2016-11-27 NOTE — Telephone Encounter (Signed)
Voicemail received from pharmacy in reference to faxed refill request for lomotil for this patient.  eRx request noted with this call.  Refill called in at this time.

## 2016-11-29 ENCOUNTER — Ambulatory Visit (HOSPITAL_BASED_OUTPATIENT_CLINIC_OR_DEPARTMENT_OTHER): Payer: PPO

## 2016-11-29 ENCOUNTER — Telehealth: Payer: Self-pay

## 2016-11-29 ENCOUNTER — Ambulatory Visit (HOSPITAL_BASED_OUTPATIENT_CLINIC_OR_DEPARTMENT_OTHER): Payer: PPO | Admitting: Medical

## 2016-11-29 ENCOUNTER — Telehealth: Payer: Self-pay | Admitting: Medical

## 2016-11-29 VITALS — BP 160/81 | HR 94 | Temp 98.6°F | Resp 18 | Ht 63.5 in | Wt 120.3 lb

## 2016-11-29 DIAGNOSIS — C182 Malignant neoplasm of ascending colon: Secondary | ICD-10-CM

## 2016-11-29 DIAGNOSIS — I10 Essential (primary) hypertension: Secondary | ICD-10-CM

## 2016-11-29 DIAGNOSIS — R197 Diarrhea, unspecified: Secondary | ICD-10-CM | POA: Diagnosis not present

## 2016-11-29 DIAGNOSIS — C187 Malignant neoplasm of sigmoid colon: Secondary | ICD-10-CM | POA: Diagnosis not present

## 2016-11-29 LAB — COMPREHENSIVE METABOLIC PANEL
ALBUMIN: 3.8 g/dL (ref 3.5–5.0)
ALK PHOS: 110 U/L (ref 40–150)
ALT: 12 U/L (ref 0–55)
AST: 24 U/L (ref 5–34)
Anion Gap: 10 mEq/L (ref 3–11)
BUN: 22.9 mg/dL (ref 7.0–26.0)
CO2: 24 mEq/L (ref 22–29)
Calcium: 9.6 mg/dL (ref 8.4–10.4)
Chloride: 104 mEq/L (ref 98–109)
Creatinine: 1.2 mg/dL — ABNORMAL HIGH (ref 0.6–1.1)
EGFR: 42 mL/min/{1.73_m2} — AB (ref >60)
Glucose: 106 mg/dl (ref 70–140)
POTASSIUM: 4 meq/L (ref 3.5–5.1)
SODIUM: 139 meq/L (ref 136–145)
Total Bilirubin: 1.37 mg/dL — ABNORMAL HIGH (ref 0.20–1.20)
Total Protein: 7.2 g/dL (ref 6.4–8.3)

## 2016-11-29 LAB — CBC WITH DIFFERENTIAL/PLATELET
BASO%: 0.7 % (ref 0.0–2.0)
BASOS ABS: 0 10*3/uL (ref 0.0–0.1)
EOS ABS: 0 10*3/uL (ref 0.0–0.5)
EOS%: 1 % (ref 0.0–7.0)
HCT: 32.3 % — ABNORMAL LOW (ref 34.8–46.6)
HGB: 10.8 g/dL — ABNORMAL LOW (ref 11.6–15.9)
LYMPH%: 15.4 % (ref 14.0–49.7)
MCH: 30.5 pg (ref 25.1–34.0)
MCHC: 33.4 g/dL (ref 31.5–36.0)
MCV: 91.2 fL (ref 79.5–101.0)
MONO#: 0.2 10*3/uL (ref 0.1–0.9)
MONO%: 5.6 % (ref 0.0–14.0)
NEUT#: 2.4 10*3/uL (ref 1.5–6.5)
NEUT%: 77.3 % — AB (ref 38.4–76.8)
Platelets: 59 10*3/uL — ABNORMAL LOW (ref 145–400)
RBC: 3.54 10*6/uL — ABNORMAL LOW (ref 3.70–5.45)
RDW: 14.2 % (ref 11.2–14.5)
WBC: 3.1 10*3/uL — ABNORMAL LOW (ref 3.9–10.3)
lymph#: 0.5 10*3/uL — ABNORMAL LOW (ref 0.9–3.3)

## 2016-11-29 LAB — MAGNESIUM: MAGNESIUM: 2.2 mg/dL (ref 1.5–2.5)

## 2016-11-29 MED ORDER — AMLODIPINE BESYLATE 5 MG PO TABS: 5.0000 mg | ORAL_TABLET | Freq: Every day | ORAL | 2 refills | Status: AC

## 2016-11-29 NOTE — Telephone Encounter (Signed)
Got telehealth report this am and called pt to f/u. She finished lonsurf on 11/23/16.  Started Friday night with diarrhea 4-5 times. Been having watery diarrhea 3-4 x per day. Yesterday she had diarrhea x4 and vomited x1.  Small vomit on Saturday, vomited yesterday a lot. The only 2 times of vomit. Drank about 2 1/2 cups water yesterday.  Been using lomotil and one nausea med.   Pt is able to arrive about 1200. Set her up for lab and [facility]. Cbc cmet and mag.

## 2016-11-29 NOTE — Patient Instructions (Signed)
Fibercon Tablets, 2 to 3 per day for bulking of stools as needed  Imodium 2 tablets 4 times daily as needed  Lomotil 2 tablets 4 times daily as needed

## 2016-11-29 NOTE — Telephone Encounter (Signed)
Scheduled 10/18 sch msg. Patient is aware of time.

## 2016-11-30 NOTE — Progress Notes (Signed)
[facility] Progress Note   Heidi Marquez [id number] May 19, 1933 83 y.o.  Heidi Marquez is managed by Dr. Truitt Merle  Actively treated with chemotherapy: yes  Current Therapy: Lonsurf  Last Treated: 10 / 12 / 2018  Assessment: Plan:    Diarrhea, unspecified type  Essential hypertension - Plan: amLODipine (NORVASC) 5 MG tablet   Diarrhea: The patient has been instructed to restart over-the-counter Imodium 2 tablets 4 times daily as needed for diarrhea and continue using Lomotil 2 tablets 4 times daily as needed for diarrhea. I also discussed with her that she could use FiberCon tablets 2-3 per day for bulking of her stools as needed.  CBC, chemistry panel, and magnesium were collected today. Results are as indicated below.  Essential hypertension:  Prescription was given for amlodipine 5 mg by mouth once daily.  Please see After Visit Summary for patient specific instructions.  Future Appointments Date Time Provider [place]  12/07/2016 1:30 PM [facility] 1 [facility] None  12/07/2016 1:45 PM Truitt Merle, MD [facility] None    No orders of the defined types were placed in this encounter.      Subjective:   Patient ID:  Heidi Marquez is a 83 y.o. (DOB 1933/10/18) female.  Chief Complaint:  Chief Complaint  Patient presents with  . Diarrhea    HPI Heidi Marquez is an 83 year old female with a history of a stage II sigmoid colon cancer who is currently treated with third line chemotherapy with Lonsurf 3 tablets by mouth twice a day on days 1 through 5 and days 8 through 12 every 28 days. Her therapy was initiated on 11/12/2016. She has been nauseated and has been somewhat constipated. She presents to the office today with a report that she started to have diarrhea on Friday and had it 4-5 times that day. Since that time she's been having watery diarrhea 3-4 times per day. Yesterday she had diarrhea on 4 occasions and vomited once  after taking medications after not eating all day. The only other time that she vomited was on Saturday. She has been using Lomotil and Compazine. She had been instructed to use Imodium in addition to Lomotil but is running out of Imodium. She reports that her diarrhea was better controlled when she was taking Lomotil and Imodium.  Medications: I have reviewed the patient's current medications.  Allergies:  Allergies  Allergen Reactions  . Sulfa Antibiotics Swelling    Eyes,mouth  . Amoxicillin-Pot Clavulanate Rash  . Penicillins Swelling and Rash    Has patient had a PCN reaction causing immediate rash, facial/tongue/throat swelling, SOB or lightheadedness with hypotension: Yes Has patient had a PCN reaction causing severe rash involving mucus membranes or skin necrosis: Unknown Has patient had a PCN reaction that required hospitalization: No Has patient had a PCN reaction occurring within the last 10 years: No If all of the above answers are "NO", then may proceed with Cephalosporin use.     Past Medical History:  Diagnosis Date  . Adenocarcinoma of colon [facility]) oncologist-  dr Truitt Merle---  last chemo 01-17-2015 to 06-27-2015)   dx 05-17-2014 sigmoid (left) cancer, Stage 2, G1 (pT3N0M0), MSI stable -- s/p  sigmoidectomy /  dx 12-08-2014 ascending colon cancer (right) , Stage 3B, G2, (pT4aN1cM0), MSI stable -- s/p  right hemicolectomy/  08-2015  recurrent peritoneal carcinomatosis  . Arthritis    knees  . Coronary atherosclerosis   . Diverticulosis of colon   . GERD (  gastroesophageal reflux disease)   . HH (hiatus hernia)   . History of basal cell carcinoma excision    right side nose 2013  s/p  moh's  . History of epistaxis    2005  caurterized twice  . History of esophageal dilatation    2009 for stricture  . History of esophagitis    reflux  . Internal hemorrhoids   . Macular degeneration of both eyes   . Multinodular thyroid    remote hx benign bx 2005  . Peritoneal  carcinomatosis ([place])    secondary to primary colon cancer  . Pulmonary nodule, right    noted since 2014-- stable per last ct 06-15-2015  . Thoracic ascending aortic aneurysm [facility])    followed by dr Cyndia Bent ([facility])  last ct 06-15-2015  4.3cm    Past Surgical History:  Procedure Laterality Date  . COLONOSCOPY WITH PROPOFOL N/A 09/30/2014   Procedure: COLONOSCOPY WITH PROPOFOL;  Surgeon: Milus Banister, MD;  Location: [facility];  Service: Endoscopy;  Laterality: N/A;  . ESOPHAGOGASTRODUODENOSCOPY  2009  . INGUINAL HERNIA REPAIR Right early 1990s  . MOHS SURGERY  11/2011   nose  . PORTACATH PLACEMENT Right 01/24/2016   Procedure: INSERTION PORT-A-CATH;  Surgeon: Leighton Ruff, MD;  Location: [facility];  Service: General;  Laterality: Right;  . ROBOTIC ASSISTED RIGHT HEMICOLECTOMY  29/47/6546    dr Leighton Ruff  . ROBOTIC ASSISTED SIGMOIDECTOMY  50/35/4656   dr Leighton Ruff  . UPPER GASTROINTESTINAL ENDOSCOPY    . VAGINAL HYSTERECTOMY  age 74   partial with the bladder tack    Family History  Problem Relation Age of Onset  . Colon cancer Paternal Grandfather 106       colon cancer   . Stroke Father   . Heart attack Son 106       died of heart attack   . Esophageal cancer Neg Hx   . Rectal cancer Neg Hx   . Stomach cancer Neg Hx     Social History   Social History  . Marital status: Married    Spouse name: N/A  . Number of children: N/A  . Years of education: N/A   Occupational History  . Not on file.   Social History Main Topics  . Smoking status: Never Smoker  . Smokeless tobacco: Never Used  . Alcohol use No  . Drug use: No  . Sexual activity: Not on file   Other Topics Concern  . Not on file   Social History Narrative   Married, husband is disabled (physical and dementia) and she is caregiver   Has #1 child (daughter) living. Son died of MI at age 74   Worked at [employer] before she retired   Has #3 cats    Past Medical  History, Surgical history, Social history, and Family history were reviewed and updated as appropriate.   Please see review of systems for further details on the patient's review from today.   Review of Systems:  Review of Systems  Constitutional: Positive for fatigue. Negative for chills, diaphoresis and fever.  Gastrointestinal: Positive for diarrhea, nausea and vomiting. Negative for constipation.    Objective:   Physical Exam:  BP (!) 160/81 (BP Location: Left Arm, Patient Position: Sitting)   Pulse 94   Temp 98.6 F (37 C) (Oral)   Resp 18   Ht 5' 3.5" (1.613 m)   Wt 120 lb 4.8 oz (54.6 kg)   SpO2 98%  BMI 20.98 kg/m  ECOG: 1   Physical Exam  Constitutional: No distress.  HENT:  Head: Normocephalic and atraumatic.  Cardiovascular: Normal rate.   Murmur heard.  Systolic murmur is present with a grade of 2/6  Pulmonary/Chest: Effort normal and breath sounds normal. No respiratory distress. She has no wheezes. She has no rales.  Abdominal: Soft. Bowel sounds are normal. She exhibits no distension. There is no tenderness. There is no rebound and no guarding.  Musculoskeletal: She exhibits no edema.  Neurological: She is alert.  Skin: Skin is warm and dry. No rash noted. She is not diaphoretic. No erythema.    Lab Review:     Component Value Date/Time   NA 139 11/29/2016 1230   K 4.0 11/29/2016 1230   CL 103 11/02/2016 0915   CO2 24 11/29/2016 1230   GLUCOSE 106 11/29/2016 1230   BUN 22.9 11/29/2016 1230   CREATININE 1.2 (H) 11/29/2016 1230   CALCIUM 9.6 11/29/2016 1230   PROT 7.2 11/29/2016 1230   ALBUMIN 3.8 11/29/2016 1230   AST 24 11/29/2016 1230   ALT 12 11/29/2016 1230   ALKPHOS 110 11/29/2016 1230   BILITOT 1.37 (H) 11/29/2016 1230   GFRNONAA >60 11/02/2016 0915   GFRAA >60 11/02/2016 0915       Component Value Date/Time   WBC 3.1 (L) 11/29/2016 1230   WBC 8.3 11/02/2016 0915   RBC 3.54 (L) 11/29/2016 1230   RBC 3.85 (L) 11/02/2016 0915   HGB  10.8 (L) 11/29/2016 1230   HCT 32.3 (L) 11/29/2016 1230   PLT 59 (L) 11/29/2016 1230   MCV 91.2 11/29/2016 1230   MCH 30.5 11/29/2016 1230   MCH 30.4 11/02/2016 0915   MCHC 33.4 11/29/2016 1230   MCHC 32.8 11/02/2016 0915   RDW 14.2 11/29/2016 1230   LYMPHSABS 0.5 (L) 11/29/2016 1230   MONOABS 0.2 11/29/2016 1230   EOSABS 0.0 11/29/2016 1230   BASOSABS 0.0 11/29/2016 1230   -------------------------------  Imaging from last 24 hours (if applicable):  Radiology interpretation: Ct Abdomen Pelvis Wo Contrast  Result Date: 11/02/2016 CLINICAL DATA:  Right flank pain worsening over last month. Nausea/vomiting x3 last night. Robot assisted right hemicolectomy and sigmoidectomy in 2016. EXAM: CT ABDOMEN AND PELVIS WITHOUT CONTRAST TECHNIQUE: Multidetector CT imaging of the abdomen and pelvis was performed following the standard protocol without IV contrast. COMPARISON:  10/02/2016 FINDINGS: Lower chest: Left lower lobe pulmonary nodule measures 5 mm on image 21 versus 4 mm on the prior. Not readily apparent back on 04/17/2016. Mild cardiomegaly, without pericardial effusion. Tiny hiatal hernia. Hepatobiliary: Normal noncontrast appearance of the liver. Multiple gallstones without acute cholecystitis or biliary duct dilatation. Pancreas: Mild pancreatic atrophy, without duct dilatation. Spleen: Low-density splenic lesions are not significantly changed and of doubtful clinical significance. Adrenals/Urinary Tract: Normal adrenal glands. Moderate right-sided hydroureteronephrosis is new or increased. No ureteric stones. The distal right ureter is difficult to follow, but the level of obstruction is favored to be at the site of soft tissue fullness anterior to the right psoas including on image 44/ series 2. Normal urinary bladder. Stomach/Bowel: Normal remainder of the stomach. Surgical sutures at the rectosigmoid junction. Scattered colonic diverticula. Partial right hemicolectomy. Normal small bowel  caliber. Vascular/Lymphatic: Aortic atherosclerosis. A node adjacent the gallbladder measures 1.2 cm on image 22/series 2 versus 1.4 cm on the prior. Small bowel mesenteric node measures 1.3 cm at image 45/series 2 versus 1.4 cm on the prior. Reproductive: Hysterectomy.  No adnexal mass. Other:  Mild to moderate pelvic floor laxity. No significant free fluid. Extensive omental/ peritoneal nodularity. Left omental nodule measures 1.4 cm on image 29/series 2 versus 1.6 cm on the prior. A left paracentral omental lesion measures 2.1 cm on image 36/series 2 versus 1.4 cm on the prior exam (when remeasured). There is also of right paracentral omental nodule which is new at 2.0 cm on image 44/series 2. Musculoskeletal: Mild osteopenia.  Lumbosacral spondylosis. IMPRESSION: 1. New or increased moderate right-sided hydroureteronephrosis. This is likely secondary to soft tissue metastasis positioned anterior to the right psoas muscle. Suboptimally evaluated on this noncontrast exam. 2. Mixed response to therapy otherwise of metastatic disease. Nodes and some peritoneal nodules are decreased in size. However, there are new and enlarged abdominal omental lesions. 3. No bowel obstruction or other gastrointestinal complication. 4. Cholelithiasis. 5. Left lower lobe pulmonary nodule was present on the most recent exam but absent on more remote studies. This is suspicious for an isolated pulmonary metastasis. 6.  Aortic Atherosclerosis (ICD10-I70.0). Electronically Signed   By: Abigail Miyamoto M.D.   On: 11/02/2016 11:53

## 2016-12-03 NOTE — Telephone Encounter (Signed)
Noted. Will see the patient in the Symptom Management Clinic as scheduled.  

## 2016-12-04 ENCOUNTER — Telehealth: Payer: Self-pay

## 2016-12-04 NOTE — Telephone Encounter (Signed)
Nutrition Assessment   Reason for Assessment:   Patient identified on Malnutrition Screening Report for weight loss and poor appetite.  ASSESSMENT:  83 year old female with colon cancer.  Patient s/p right hemicolectomy on 11/2014, followed by chemotherapy poor tolerance and currently on third line chemo lonsurf.  Past medical history of GERD, coronary atherosclerosis, esophageal dilation  Spoke with patient via phone this pm.  Patient reports appetite has improved as she has been off chemotherapy pill for 2 weeks now.  Nausea as well as diarrhea was really affecting nutrition.  Reports ate breakfast around 10am of sausage biscuit and small bowl of cereal, coffee and water.  Has not has lunch yet. Reports she ate well this weekend as attended family reunion and neighbour prepared a stew.  Reports she has not tried ensure/boost drinks yet.   Nutrition Focused Physical Exam: deferred  Medications: Vit C, lomotil, imodium, MVI, zofran, Kdur, compazine, Vit B 12  Labs: reviewed  Anthropometrics:   Height: 63 inches Weight: 120 lb 4.8 oz UBW: 128 lb 11/02/2016 BMI: 20  6% weight loss in the last month, significant   NUTRITION DIAGNOSIS: Unintentional weight loss related to cancer related treatment side effects (nausea, diarrhea) as evidenced by 6% weight loss in the last month   MALNUTRITION DIAGNOSIS: likely but not seen in person   INTERVENTION:   Discussed strategies to help with diarrhea and nausea.  Will mail fact sheets.   Discussed oral nutrition supplements.  Will mail coupons Offered nutrition appointment and patient will call back if needed.  Appreciative of information    MONITORING, EVALUATION, GOAL: Patient will consume adequate calories and protein to meet nutritional needs   NEXT VISIT: as needed, patient to call  Heidi Marquez B. Zenia Resides, [place], [place] Registered Dietitian [phone] (pager)

## 2016-12-05 DIAGNOSIS — M1711 Unilateral primary osteoarthritis, right knee: Secondary | ICD-10-CM | POA: Diagnosis not present

## 2016-12-05 DIAGNOSIS — M5136 Other intervertebral disc degeneration, lumbar region: Secondary | ICD-10-CM | POA: Diagnosis not present

## 2016-12-06 ENCOUNTER — Encounter (HOSPITAL_COMMUNITY): Payer: Self-pay

## 2016-12-06 ENCOUNTER — Emergency Department (HOSPITAL_COMMUNITY)
Admission: EM | Admit: 2016-12-06 | Discharge: 2016-12-07 | Disposition: A | Discharge status: Home / Self Care | Payer: PPO | Attending: Emergency Medicine | Admitting: Emergency Medicine

## 2016-12-06 ENCOUNTER — Emergency Department (HOSPITAL_COMMUNITY): Payer: PPO

## 2016-12-06 ENCOUNTER — Telehealth: Payer: Self-pay

## 2016-12-06 DIAGNOSIS — R509 Fever, unspecified: Secondary | ICD-10-CM | POA: Insufficient documentation

## 2016-12-06 DIAGNOSIS — Z79899 Other long term (current) drug therapy: Secondary | ICD-10-CM | POA: Insufficient documentation

## 2016-12-06 DIAGNOSIS — Z85038 Personal history of other malignant neoplasm of large intestine: Secondary | ICD-10-CM | POA: Insufficient documentation

## 2016-12-06 DIAGNOSIS — M25561 Pain in right knee: Secondary | ICD-10-CM | POA: Insufficient documentation

## 2016-12-06 DIAGNOSIS — Z85828 Personal history of other malignant neoplasm of skin: Secondary | ICD-10-CM | POA: Diagnosis not present

## 2016-12-06 LAB — CBC WITH DIFFERENTIAL/PLATELET
BASOS ABS: 0 10*3/uL (ref 0.0–0.1)
BASOS PCT: 0 %
EOS ABS: 0 10*3/uL (ref 0.0–0.7)
Eosinophils Relative: 0 %
HCT: 28.1 % — ABNORMAL LOW (ref 36.0–46.0)
Hemoglobin: 9.4 g/dL — ABNORMAL LOW (ref 12.0–15.0)
LYMPHS ABS: 0.6 10*3/uL — AB (ref 0.7–4.0)
Lymphocytes Relative: 16 %
MCH: 30.6 pg (ref 26.0–34.0)
MCHC: 33.5 g/dL (ref 30.0–36.0)
MCV: 91.5 fL (ref 78.0–100.0)
Monocytes Absolute: 0.8 10*3/uL (ref 0.1–1.0)
Monocytes Relative: 22 %
NEUTROS ABS: 2.2 10*3/uL (ref 1.7–7.7)
Neutrophils Relative %: 62 %
Platelets: 83 10*3/uL — ABNORMAL LOW (ref 150–400)
RBC: 3.07 MIL/uL — ABNORMAL LOW (ref 3.87–5.11)
RDW: 16.4 % — AB (ref 11.5–15.5)
WBC: 3.6 10*3/uL — ABNORMAL LOW (ref 4.0–10.5)

## 2016-12-06 LAB — COMPREHENSIVE METABOLIC PANEL
ALBUMIN: 3.5 g/dL (ref 3.5–5.0)
ALK PHOS: 100 U/L (ref 38–126)
ALT: 25 U/L (ref 14–54)
AST: 45 U/L — AB (ref 15–41)
Anion gap: 12 (ref 5–15)
BILIRUBIN TOTAL: 1.7 mg/dL — AB (ref 0.3–1.2)
BUN: 22 mg/dL — AB (ref 6–20)
CALCIUM: 8.6 mg/dL — AB (ref 8.9–10.3)
CO2: 25 mmol/L (ref 22–32)
CREATININE: 1.24 mg/dL — AB (ref 0.44–1.00)
Chloride: 98 mmol/L — ABNORMAL LOW (ref 101–111)
GFR calc Af Amer: 46 mL/min — ABNORMAL LOW (ref >60)
GFR, EST NON AFRICAN AMERICAN: 40 mL/min — AB (ref >60)
GLUCOSE: 168 mg/dL — AB (ref 65–99)
Potassium: 3.6 mmol/L (ref 3.5–5.1)
SODIUM: 135 mmol/L (ref 135–145)
TOTAL PROTEIN: 6.6 g/dL (ref 6.5–8.1)

## 2016-12-06 LAB — SEDIMENTATION RATE: SED RATE: 60 mm/h — AB (ref 0–22)

## 2016-12-06 LAB — URINALYSIS, ROUTINE W REFLEX MICROSCOPIC
BACTERIA UA: NONE SEEN
BILIRUBIN URINE: NEGATIVE
Glucose, UA: NEGATIVE mg/dL
Hgb urine dipstick: NEGATIVE
Ketones, ur: NEGATIVE mg/dL
LEUKOCYTES UA: NEGATIVE
Nitrite: NEGATIVE
PROTEIN: 100 mg/dL — AB
SPECIFIC GRAVITY, URINE: 1.016 (ref 1.005–1.030)
SQUAMOUS EPITHELIAL / LPF: NONE SEEN
pH: 6 (ref 5.0–8.0)

## 2016-12-06 LAB — PROTIME-INR
INR: 1.17
PROTHROMBIN TIME: 14.8 s (ref 11.4–15.2)

## 2016-12-06 LAB — I-STAT CG4 LACTIC ACID, ED
LACTIC ACID, VENOUS: 1.92 mmol/L — AB (ref 0.5–1.9)
Lactic Acid, Venous: 1.06 mmol/L (ref 0.5–1.9)

## 2016-12-06 LAB — C-REACTIVE PROTEIN: CRP: 11.3 mg/dL — AB (ref <1.0)

## 2016-12-06 MED ORDER — ACETAMINOPHEN 325 MG PO TABS
650.0000 mg | ORAL_TABLET | Freq: Once | ORAL | Status: AC
  Administered 2016-12-06: 650 mg via ORAL
  Filled 2016-12-06: qty 2

## 2016-12-06 MED ORDER — HYDROCODONE-ACETAMINOPHEN 5-325 MG PO TABS
1.0000 | ORAL_TABLET | Freq: Once | ORAL | Status: AC
  Administered 2016-12-06: 1 via ORAL
  Filled 2016-12-06: qty 1

## 2016-12-06 MED ORDER — HEPARIN SOD (PORK) LOCK FLUSH 100 UNIT/ML IV SOLN
500.0000 [IU] | Freq: Once | INTRAVENOUS | Status: AC
  Administered 2016-12-06: 500 [IU]
  Filled 2016-12-06: qty 5

## 2016-12-06 MED ORDER — ACETAMINOPHEN 500 MG PO TABS: 1000.0000 mg | ORAL_TABLET | Freq: Once | ORAL | Status: DC

## 2016-12-06 MED ORDER — SODIUM CHLORIDE 0.9 % IV BOLUS (SEPSIS)
1000.0000 mL | Freq: Once | INTRAVENOUS | Status: AC
  Administered 2016-12-06: 1000 mL via INTRAVENOUS

## 2016-12-06 NOTE — ED Notes (Signed)
Patient is requesting a Hydrocodone instead of Tylenol. Will inform the provider.

## 2016-12-06 NOTE — ED Triage Notes (Signed)
Patient states she began having a fever at home which was 101.6. Patient called the cancer center nurse hot line and was told to come to the ED. Patient also c/o right knee pain. Patient had a MRI scheduled today, but cancelled due to fever.

## 2016-12-06 NOTE — Progress Notes (Signed)
- [place]  [phone] [phone]) [phone]  Clinic Follow Up Note   Patient Care Team: Prince Solian, MD as PCP - General (Internal Medicine) Gaye Pollack, MD as Consulting Physician (Cardiothoracic Surgery) Gatha Mayer, MD as Consulting Physician (Gastroenterology) Leighton Ruff, MD as Consulting Physician (General Surgery) Tania Ade, RN as Registered Nurse (Medical Oncology) 12/07/2016   CHIEF COMPLAINTS:  Follow-up of colon cancer  Oncology History   Cancer of left colon [facility])   Staging form: Colon and Rectum, AJCC 7th Edition     Pathologic stage from 05/17/2014: Stage IIA (T3, N0, cM0) - Signed by Truitt Merle, MD on 02/21/2015 Cancer of right colon [facility])   Staging form: Colon and Rectum, AJCC 7th Edition     Pathologic stage from 12/08/2014: Stage IIIB (T4a, N1a, cM0) - Signed by Truitt Merle, MD on 02/21/2015       Cancer of right colon ([place])   04/09/2014 Procedure    Colonoscopy per Dr. Silvano Rusk: Large fleshy mass sigmoid colon 25 cm from anus. Could not pass through.      04/09/2014 Tumor Marker    CEA =11.7      04/12/2014 Imaging    CT C/A/P:sigmoid colon mass;several small areas of soft tissue nodularity in peritoneal cavity; small attenuation structure posterior right hepatic lobe; 2 stable nodules in lungs dating back to 2014. No acute findings.      05/17/2014 Initial Diagnosis    Colon cancer-presented with heme + stool and more frequent BMs      05/17/2014 Surgery    Robot assisted sigmoidectomy      05/17/2014 Pathologic Stage    pT3,pN0,pMX--Grade 1--0/13 nodes +--MMR normal      05/17/2014 Clinical Stage    Stage II      12/08/2014 Surgery    right hemicolectomy       12/08/2014 Pathology Results    right colon adenocarcinoma, pT4aN1c, 19 nodes were negative, LVI(+), MMR normal       01/17/2015 - 06/27/2015 Chemotherapy    adjuvant capecitabine 1561m q12h, 2 weeks on and 1 week off. dose reduction and  stopped after 6 cycles due to poor tolerance,       08/15/2015 Progression    CT abdomen showed peritoneal nodules, highly suspicious for recurrent metastasis      10/10/2015 Imaging    PET scan showed multiple enlarging, hypermetabolic peritoneal nodules consistent with peritoneal carcinomatosis. No other evidence of metastasis      11/04/2015 Pathology Results    Diagnosis 11/04/2015 Peritoneum, biopsy, RLQ - METASTATIC COLORECTAL ADENOCARCINOMA. - SEE COMMENT. -[facility]      02/02/2016 Imaging    CT CAP W CONTRAST 02/02/2016 IMPRESSION: 1. Extensive worsening of peritoneal spread of tumor with numerous large new tumor nodules scattered throughout The upper omentum and also scattered within along the mesentery. Mild extension into the right retroperitoneum at the level of the iliac crest. 2. Slight reduction in size of the right upper lobe pulmonary nodule 1.1 cm, previously 1.2 cm. Stable indistinct ground-glass density nodule in the right upper lobe. 3. Airway thickening is present, suggesting bronchitis or reactive airways disease. 4. Ascending aortic aneurysm 4.0 cm in diameter. Recommend annual imaging followup by CTA or MRA. This recommendation follows 2010 ACCF/AHA/AATS/ACR/ASA/SCA/SCAI/SIR/STS/SVM Guidelines for the Diagnosis and Management of Patients with Thoracic Aortic Disease. Circulation. 2010; 121: e266-e369 5. Cholelithiasis.      02/08/2016 - 07/2016 Chemotherapy    5-fu infusion and Avastin every 2 weeks  04/17/2016 Imaging    CT CAP  IMPRESSION: 1. Slight interval improvement in extensive peritoneal carcinomatosis compared with prior CT from 02/02/2016. No definite disease progression. 2. No evidence of thoracic or hepatic metastases. 3. No definite bowel wall invasion, ascites or bowel obstruction. 4. Cholelithiasis. 5. Stable mild pulmonary nodularity, likely benign.      07/17/2016 Imaging    CT CAP w contrast IMPRESSION: 1. Today's  study demonstrates progressive peritoneal carcinomatosis, as detailed above. Mildly enlarged hepatoduodenal ligament lymph node also noted. 2. No other metastatic disease noted elsewhere in the chest (previously noted right upper lobe pulmonary nodule remains stable over several prior examinations), abdomen or pelvis. 3. Aortic atherosclerosis, with ectasia of ascending thoracic aorta (4.3 cm in diameter). Recommend annual imaging followup by CTA or MRA. This recommendation follows 2010       07/19/2016 - 10/22/2016 Chemotherapy    Irinotecan + bevacizumab Q14d --Cycle #1, 93/81/01: Complicated by protracted neutropenia, grade 2 diarrhea, grade 1 nausea --Cycle #2, 08/09/16: delayed due to neutropenia (ANC 0.4 on 08/02/16) and Avastin every 2 weeks, started on 07/19/2016, irinotecan dose reduced to 149m/m2 from cycle 3, held since 09/30/16 due to neutropenic fever. Last cycle 4 was 09/20/16       09/30/2016 - 10/04/2016 Hospital Admission    Admit date: 09/30/16 Admission diagnosis: Neutropenic Fever secondary to chemo Additional comments: Will hold chemo for 3-4 weeks until Heidi Marquez recovers      11/12/2016 -  Chemotherapy    Third line Chemo, Lonsurf 3 tabs BID on day 1-5 and 8-12, every 28 days        HISTORY OF PRESENTING ILLNESS:  Heidi KOBEL83y.o. female is here because of recently diagnosed a stage II sigmoid colon cancer.  Heidi Marquez had annual physical with her PCP in early Feb this year, and stool OB (+), no overt GI bleeding, CBC was normal. Heidi Marquez had noticed some loose BM before that, but no nausea, loss of appetite, weight loss or other symptoms. Colonoscopy showed a large mass in the sigmoid colon 25 cm from anus. Biopsy was taken which showed tubulovillous adenoma, no malignancy. Heidi Marquez was referred to surgeon Dr. TMarcello Mooresand underwent robotic-assisted sigmoidectomy on 4/4. Heidi Marquez tolerated the procedure well, and was discharged home afterwards.  Heidi Marquez has recovered well from the surgery 3  weeks ago, Heidi Marquez has mild fatigue, but otherwise doing very well without other complains. Heidi Marquez had 2-3 episodes of bleeding after the surgery, which was related to her hemorrhoids. Heidi Marquez is eating well. 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   Heidi Marquez has chronic knee pain Heidi Marquez takes ibuprofen once daily.    CURRENT THERAPY: change to supportive Care and hospice    INTERIM HISTORY:  Heidi Marquez for follow-up. Heidi Marquez presents to the clinic today accompanied by her daughter.   Heidi Marquez reports Heidi Marquez started Losurf and 2 weeks ago Heidi Marquez was fine. Her last day of cycle Heidi Marquez started having diarrhea that last for 5 days. Heidi Marquez had gone to the bathroom 5-6 times a day. Heidi Marquez was weekend, nauseous and did not have much food intake. Heidi Marquez took imodium. Her diarrhea has resolved.  Yesterday Heidi Marquez had a fever at 101.80F, Heidi Marquez had slept 13.5 hours. Before coming to ER her Temperature was still over 100. In ER Heidi Marquez was given IVF. Heidi Marquez was not able to urinate so they had to catheterize her and they drained a significant amount.  After IVF Heidi Marquez felt better. Heidi Marquez  has little intake today, no fever today and has not urinated today. Heidi Marquez denies flu symptoms recently. Heidi Marquez agrees to get IVF today and her daughter reports Heidi Marquez is not drinking enough. Her daughter really wants help to take care of her.  Heidi Marquez does note right knee pain and lower back pain. Her orthopedist wanted to MRI her spine. Heidi Marquez says her knee pain goes up to her back. Numbers will go down her leg as well. Heidi Marquez is not very mobile and will use a wheelchair. It is time for her Rooster shot for her knee pain, but was told to hold it until her MRI.     MEDICAL HISTORY:  Past Medical History:  Diagnosis Date  . Adenocarcinoma of colon [facility]) oncologist-  dr Truitt Merle---  last chemo 01-17-2015 to 06-27-2015)   dx 05-17-2014 sigmoid (left) cancer, Stage 2, G1 (pT3N0M0), MSI stable -- s/p  sigmoidectomy /  dx 12-08-2014 ascending colon  cancer (right) , Stage 3B, G2, (pT4aN1cM0), MSI stable -- s/p  right hemicolectomy/  08-2015  recurrent peritoneal carcinomatosis  . Arthritis    knees  . Coronary atherosclerosis   . Diverticulosis of colon   . GERD (gastroesophageal reflux disease)   . HH (hiatus hernia)   . History of basal cell carcinoma excision    right side nose 2013  s/p  moh's  . History of epistaxis    2005  caurterized twice  . History of esophageal dilatation    2009 for stricture  . History of esophagitis    reflux  . Internal hemorrhoids   . Macular degeneration of both eyes   . Multinodular thyroid    remote hx benign bx 2005  . Peritoneal carcinomatosis ([place])    secondary to primary colon cancer  . Pulmonary nodule, right    noted since 2014-- stable per last ct 06-15-2015  . Thoracic ascending aortic aneurysm [facility])    followed by dr Cyndia Bent ([facility])  last ct 06-15-2015  4.3cm    SURGICAL HISTORY: Past Surgical History:  Procedure Laterality Date  . COLONOSCOPY WITH PROPOFOL N/A 09/30/2014   Procedure: COLONOSCOPY WITH PROPOFOL;  Surgeon: Milus Banister, MD;  Location: [facility];  Service: Endoscopy;  Laterality: N/A;  . ESOPHAGOGASTRODUODENOSCOPY  2009  . INGUINAL HERNIA REPAIR Right early 1990s  . MOHS SURGERY  11/2011   nose  . PORTACATH PLACEMENT Right 01/24/2016   Procedure: INSERTION PORT-A-CATH;  Surgeon: Leighton Ruff, MD;  Location: [facility];  Service: General;  Laterality: Right;  . ROBOTIC ASSISTED RIGHT HEMICOLECTOMY  43/32/9518    dr Leighton Ruff  . ROBOTIC ASSISTED SIGMOIDECTOMY  84/16/6063   dr Leighton Ruff  . UPPER GASTROINTESTINAL ENDOSCOPY    . VAGINAL HYSTERECTOMY  age 36   partial with the bladder tack    SOCIAL HISTORY: Social History   Social History  . Marital status: Married    Spouse name: N/A  . Number of children: N/A  . Years of education: N/A   Occupational History  . Not on file.   Social History Main Topics  . Smoking status:  Never Smoker  . Smokeless tobacco: Never Used  . Alcohol use No  . Drug use: No  . Sexual activity: Not on file   Other Topics Concern  . Not on file   Social History Narrative   Married, husband is disabled (physical and dementia) and Heidi Marquez is caregiver   Has #1 child (daughter) living. Son died of MI at age 50  Worked at [employer] before Heidi Marquez retired   Has #3 cats    FAMILY HISTORY: Family History  Problem Relation Age of Onset  . Colon cancer Paternal Grandfather 67       colon cancer   . Stroke Father   . Heart attack Son 68       died of heart attack   . Esophageal cancer Neg Hx   . Rectal cancer Neg Hx   . Stomach cancer Neg Hx     ALLERGIES:  is allergic to sulfa antibiotics; amoxicillin-pot clavulanate; and penicillins.  MEDICATIONS:  Current Outpatient Prescriptions  Medication Sig Dispense Refill  . alendronate (FOSAMAX) 70 MG tablet Take 70 mg by mouth every 7 (seven) days. Sundays. Take with a full glass of water on an empty stomach.    Marland Kitchen amLODipine (NORVASC) 5 MG tablet Take 1 tablet (5 mg total) by mouth daily. 30 tablet 2  . Ascorbic Acid (VITAMIN C) 1000 MG tablet Take 1,000 mg by mouth daily.    . Biotin 1000 MCG tablet Take 1,000 mcg by mouth daily.     Marland Kitchen CALCIUM & MAGNESIUM CARBONATES PO Take 1 tablet by mouth daily.    . cholecalciferol (VITAMIN D) 1000 UNITS tablet Take 1,000 Units by mouth daily.    . diphenoxylate-atropine (LOMOTIL) 2.5-0.025 MG tablet TAKE 1 TO 2 TABLETS BY MOUTH FOUR (4) TIMES DAILY AS NEEDED FOR DIARRHEA OR LOOSE STOOLS. 30 tablet 2  . fluticasone (FLONASE) 50 MCG/ACT nasal spray Place 1 spray into both nostrils daily as needed for allergies.     Marland Kitchen glucosamine-chondroitin 500-400 MG tablet Take 1 tablet by mouth daily.     . Homeopathic Products ([employer] LEG CRAMPS PAIN RELIEF PO) Take 1 tablet by mouth daily as needed (for leg cramps).    Marland Kitchen HYDROcodone-acetaminophen (NORCO/VICODIN) 5-325 MG tablet Take 1 tablet by mouth every 6  (six) hours as needed. (Patient taking differently: Take 0.5 tablets by mouth every 6 (six) hours as needed for moderate pain or severe pain. ) 60 tablet 0  . ibuprofen (ADVIL,MOTRIN) 200 MG tablet Take 200 mg by mouth daily as needed for moderate pain.     Marland Kitchen lidocaine-prilocaine (EMLA) cream Apply to affected area once (Patient not taking: Reported on 12/06/2016) 30 g 3  . loratadine (CLARITIN) 10 MG tablet Take 10 mg by mouth daily.    . Multiple Vitamin (MULTIVITAMIN) tablet Take 1 tablet by mouth daily.    . ondansetron (ZOFRAN) 8 MG tablet Take 1 tablet (8 mg total) by mouth every 8 (eight) hours as needed (Nausea or vomiting). (Patient not taking: Reported on 12/06/2016) 30 tablet 1  . potassium chloride SA (K-DUR,KLOR-CON) 20 MEQ tablet Take 1 tablet (20 mEq total) by mouth daily. 20 tablet 1  . prochlorperazine (COMPAZINE) 10 MG tablet Take 1 tablet (10 mg total) by mouth every 8 (eight) hours as needed for nausea (Nausea or vomiting). 30 tablet 2  . ranitidine (ZANTAC) 150 MG tablet Take 150 mg by mouth as needed for heartburn.    . trifluridine-tipiracil (LONSURF) 20-8.19 MG tablet Take 3 tabs (74m trifluridine) by mouth twice daily within 1 hr of AM & PM meals on days 1-5 and 8-12, repeat every 28 days 60 tablet 0  . vitamin B-12 (CYANOCOBALAMIN) 1000 MCG tablet Take 1,000 mcg by mouth daily.      No current facility-administered medications for this visit.    Facility-Administered Medications Ordered in Other Visits  Medication Dose Route Frequency Provider Last  Rate Last Dose  . sodium chloride flush (NS) 0.9 % injection 10 mL  10 mL Intracatheter PRN Truitt Merle, MD   10 mL at 07/19/16 1515    REVIEW OF SYSTEMS:  Constitutional: Denies fevers, chills or abnormal night sweats (+)fatigue (+) low appetite (+) weight loss (+) low appetite and low fluid intake Eyes: Denies blurriness of vision, double vision or watery eyes  Ears, nose, mouth, throat, and face: Denies mucositis   Respiratory: Denies cough, dyspnea or wheezes Cardiovascular: Denies palpitation, chest discomfort or lower extremity swelling Gastrointestinal:  Denies heartburn  Urinary: (+) low urine output  Skin: Denies abnormal skin rashes MSK: (+) right knee and hip pain/numbness, low back pain   Lymphatics: Denies new lymphadenopathy or easy bruising Neurological:Denies numbness, or new weaknesses  Behavioral/Psych: Mood is stable, no new changes  All other systems were reviewed with the patient and are negative.   PHYSICAL EXAMINATION:  ECOG PERFORMANCE STATUS: 1  Vitals:   12/07/16 1452  BP: 135/76  Pulse: 99  Resp: 20  Temp: 98.9 F (37.2 C)  SpO2: 97%   Filed Weights   12/07/16 1452  Weight: 125 lb 12.8 oz (57.1 kg)     GENERAL:alert, no distress and comfortable SKIN: skin color, texture, turgor are normal  or significant lesions   EYES: normal, conjunctiva are pink and non-injected, sclera clear OROPHARYNX:no exudate, no erythema and lips, buccal mucosa, and tongue normal  NECK: supple, thyroid normal size, non-tender, without nodularity LYMPH:  no palpable lymphadenopathy in the cervical, axillary or inguinal LUNGS: clear to auscultation and percussion with normal breathing effort HEART: regular rate & rhythm and no murmurs and no lower extremity edema ABDOMEN: (+) abdomen soft, surgical incision and to the low abdomen is well healed. Tenderness in RLQ and normal bowel sounds, (+) previous rectal exam showed internal and external hemorrhoids, no bleeding, stool was green.  Musculoskeletal:no cyanosis of digits and no clubbing (+) swelling of right knee PSYCH: alert & oriented x 3 with fluent speech NEURO: no focal motor/sensory deficits   LABORATORY DATA:  I have reviewed the data as listed CBC Latest Ref Rng & Units 12/06/2016 11/29/2016 11/19/2016  WBC 4.0 - 10.5 K/uL 3.6(L) 3.1(L) 6.8  Hemoglobin 12.0 - 15.0 g/dL 9.4(L) 10.8(L) 11.2(L)  Hematocrit 36.0 - 46.0 % 28.1(L)  32.3(L) 34.4(L)  Platelets 150 - 400 K/uL 83(L) 59(L) 145   CMP Latest Ref Rng & Units 12/06/2016 11/29/2016 11/19/2016  Glucose 65 - 99 mg/dL 168(H) 106 114  BUN 6 - 20 mg/dL 22(H) 22.9 21.4  Creatinine 0.44 - 1.00 mg/dL 1.24(H) 1.2(H) 1.3(H)  Sodium 135 - 145 mmol/L 135 139 139  Potassium 3.5 - 5.1 mmol/L 3.6 4.0 3.9  Chloride 101 - 111 mmol/L 98(L) - -  CO2 22 - 32 mmol/L '25 24 28  ' Calcium 8.9 - 10.3 mg/dL 8.6(L) 9.6 9.3  Total Protein 6.5 - 8.1 g/dL 6.6 7.2 6.7  Total Bilirubin 0.3 - 1.2 mg/dL 1.7(H) 1.37(H) 1.07  Alkaline Phos 38 - 126 U/L 100 110 100  AST 15 - 41 U/L 45(H) 24 23  ALT 14 - 54 U/L '25 12 11    ' CEA 05/02/2015: 2.6 08/15/2015: 9.6 12/16/2015: 253 03/08/2016: 463 04/05/16: 443.6 05/03/16: 347.82 05/31/16: 311.27 07/05/2016: 313.82 08/02/2016: 314.33 09/20/2016: 270.53 10/22/16: 252.79 11/19/16: 410.17  Pathology report:  Diagnosis 11/04/2015 Peritoneum, biopsy, RLQ - METASTATIC COLORECTAL ADENOCARCINOMA. - SEE COMMENT.  Diagnosis 05/17/2014 1. Colon, segmental resection for tumor, sigmoid, open end proximal - INVASIVE ADENOCARCINOMA WITH  EXTRACELLULAR MUCIN, INVADING THROUGH THE MUSCULARIS PROPRIA INTO PERICOLONIC FATTY TISSUE. - THIRTEEN LYMPH NODES, NEGATIVE FOR METASTATIC CARCINOMA (0/13). - MULTIPLE DIVERTICULA. - RESECTION MARGINS, NEGATIVE FOR ATYPIA OR MALIGNANCY. - PLEASE SEE ONCOLOGY TEMPLATE FOR DETAILS. 2. Colon, resection margin (donut), sigmoid - BENIGN COLONIC MUCOSA, NO EVIDENCE OF DYSPLASIA OR MALIGNANCY.  Diagnosis 12/08/2014  Colon, segmental resection for tumor, right colon MODERATELY DIFFERENTIATED ADENOCARCINOMA OF THE COLON (3.8 CM) THE TUMOR PENETRATES TO THE SURFACE OF THE VISCERAL PERITONEUM (T4A) LYMPHOVASCULAR INVASION IS IDENTIFIED MARGINS OF RESECTION ARE NEGATIVE FOR TUMOR NINTEEN BENIGN LYMPH NODE (0/19) ONE TUMOR DEPOSITE IS IDENTIFIED ([place])   Diagnosis 11/04/2015 Peritoneum, biopsy, RLQ - METASTATIC COLORECTAL  ADENOCARCINOMA. - SEE COMMENT  RADIOGRAPHIC STUDIES: I have personally reviewed the radiological images as listed and agreed with the findings in the report.  CT CAP w contrast 10/02/2016 IMPRESSION: Interval necks response is demonstrated. Some areas show definite improvement but there also some new omental nodules on the right side.  Gallbladder wall thickening and pericholecystic inflammatory type changes along with gallstones. Could not exclude acute cholecystitis but this also could represent peritoneal surface disease. Recommend clinical correlation with any acute symptoms or physical exam findings.    ASSESSMENT & PLAN:  83 y.o. Caucasian female, with past medical history of colon diverticulosis and arthritis, but otherwise healthy and active, who was found to have a large sigmoid tumor, status post sigmoidectomy.  1. Sigmoid colon adenocarcinoma, pT3N0M0, stage II, G1, MSI stable, ascending colon adenocarcinoma, pT4aN1cM0, stage IIIB, MSI stable, G2, KRAS mutation (+), recurrent peritoneal carcinomatosis in 08/2015 -Heidi Marquez had multifocal (2) colon cancer status post complete surgical resection,  -I previously reviewed her recent surgical pathology results in great details with her. The right colon cancer has several high-risk features, including stage IIIB, T4a lesion, tumor deposits, lymphovascular invasion, her risk of colon cancer recurrence is high.  -Heidi Marquez tried adjuvant chemotherapy Xeloda, but could not tolerate full dose, and last cycle chemotherapy was canceled due to her tolerance issue. -I previously reviewed her recent PET scan from 10/10/2015, which unfortunately showed multiple enlarging, hypermetabolic peritoneal nodules consistent with peritoneal carcinomatosis. No ascites or other metastasis. -I previously discussed her peritoneal mass biopsy, which confirmed metastatic colon cancer  -We previously discussed that her cancer is not curable at this stage, Heidi Marquez is not a  candidate for debulking and HIPEC, and her goal of care is palliation.  -Her [facility] genomic testing results were discussed with the patient. Heidi Marquez has (+) KRAS mutation, not a candidate for EGFR inhibitor   -Her tumor has MSI stable, not a good candidate for immunotherapy alone  -Heidi Marquez has started first-line palliative chemotherapy with 5-FU infusion and Avastin on 02/08/16, tolerate treatment better w/o 5-fu bolus -We previously reviewed her staging CT scan from 04/17/2016, which showed partial response of her peritoneal metastasis, no other new lesions. -I previously reviewed her restaging CT scan image from 07/17/2016 with patient and her friend in person. Heidi Marquez unfortunately has had disease progression in the peritoneal metastasis. Heidi Marquez was switched to second-line Irinotecan + Avastin on 07/19/16 --I reviewed her CT scan findings from 10/02/2016, which showed a mixed response to Irinotecan and Avastin, the peritoneum metastasis has decreased in size, however Heidi Marquez developed multiple new peritoneum nodules on the right side -Heidi Marquez is now on third line Lonsurf started on 11/12/16. Heidi Marquez tolerates Lonsurf moderately well other than nausea and slight constipation and weight loss.  -Due to poor tolerance with diarrhea while on Lonsurf, I will hold her chemotherapy  while Heidi Marquez recovers. Given her advanced age, poor tolerance, I did not recommend her to try Lonsurf again.  -Her overall condition has deteriorated in the past few weeks, Heidi Marquez now has oliguria, possible related to the tumor obstruction to ureter  -I recommend her to consider hospice. Heidi Marquez lives alone, has limited social support, and Heidi Marquez is unlikely a candidate for further anti-cancer treatment. After lengthy discussion with patient and her daughter, who encouraged her to consider hospice, Heidi Marquez agrees with referral.  -Labs reviewed with patient. I encouraged her to call us if Heidi Marquez has a fever spike again, or if her blood culture returns positive for infection.   -Will give IV Fluids today as Heidi Marquez has not urinated since last night.    2. Peripheral neuropathy, G 1 -From previous chemotherapy Xeloda, mild and stable now.  3. GERD -Heidi Marquez'll continue Protonix.  4. Lung nodules  -Heidi Marquez has two small right lung nodules which were discovered on the CT scan in 09/2012,  RUL nodule slightly bigger on recent CT -Heidi Marquez has been follow-up with Dr. Cyndia Bent.  -Heidi Marquez never smoked, low risk for lung cancer -given the stability of the RUL nodule over 2 years, this is likely a benign lesion   5. HTN  -Slightly elevated previously. No history of HTN, possible related to Avastin  -Heidi Marquez will start monitoring and writing down her BP readings at home.  -continue meds   6. Nausea and weight loss -Secondary to chemotherapy -I recommend her to take Compazine or Zofran before each dose of Lonsurf for nausea  -I encouraged her to try nutritional supplement, such as ensure no posterior, for her weight loss -After her recent episode of diarrhea, I suggest Heidi Marquez takes supplement like Ensure, Heidi Marquez agrees.   7. Right low back and leg pain  -Onset in the last 6 weeks as of 11/19/16 -Possibly related to her peritoneal carcinomatosis, her recent CT scan a few weeks ago did not show any significant bone metastasis -I will refill her Hydrocodone (11/19/16) -Heidi Marquez plans to do MRI of Spine with orthopedist -I suggest Heidi Marquez continues pain medication as needed  8. AKI secondary to right hydronephrosis -Cr elevation to 1.3 on 10/8 which is likely related to her tumor blocking her kidney.  -I recommend Heidi Marquez watches her kidney function, such as urine output and urin color and drinks plenty of water.  -I suggested a cystoscopy for possible ureteral stent placement. I previously referred her to [facility].    9. Support/social issue -Her husband's dementia has been progressing and he has become aggressive and dangerous. -I have previously encouraged her to speak to our social worker.  -We  previously discussed speaking to his doctor to get medication to help control his aggressive behavior.  -Her daughter previously wanted to put the patient's husband in a dementia facility. -Her husband passed 10/02/16.   10. Goal of care discussion  -We again discussed the incurable nature of her cancer, and the overall poor prognosis, especially if Heidi Marquez does not have good response to chemotherapy or progress on chemo -The patient understands the goal of care is palliative. -I recommend DNR/DNI, Heidi Marquez will think about it  -I offered at home Hospice [facility] referral as Heidi Marquez is no longer continuing treatment. Heidi Marquez agrees.   Plan -Stop [place] referral  -IVF today  -F/u as needed   All questions were answered. The patient knows to call the clinic with any problems, questions or concerns.  I spent 30 minutes counseling the patient face  to face. The total time spent in the appointment was 40  minutes and more than 50% was on counseling.   I have reviewed the above documentation for accuracy and completeness, and I agree with the above information.      This document serves as a record of services personally performed by Truitt Merle, MD. It was created on her behalf by Joslyn Devon, a trained medical scribe. The creation of this record is based on the scribe's personal observations and the provider's statements to them. This document has been checked and approved by the attending provider.   Truitt Merle 12/07/2016

## 2016-12-06 NOTE — ED Provider Notes (Signed)
Central DEPT Provider Note   CSN: [id number] Arrival date & time: 12/06/16  1802     History   Chief Complaint Chief Complaint  Patient presents with  . Fever  . Knee Pain    HPI Heidi Marquez is a 83 y.o. female.  HPI  83 year old female who presents with fever. 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 She has been taking oral chemotherapy, and currently on off week. Reports fever of 101.6 earlier today. States that she has had a mild runny nose, but no cough, congestion, sore throat, difficulty breathing, chest pain, abdominal pain, vomiting or diarrhea, dysuria or urinary frequency. States several week history of right lower back pain now for which she is seeing Dr. Maureen Ralphs. She has had right knee pain after fall 1 week ago that is been constant as well but no significant swelling or redness. daugther states she slept for a very long time this morning, and woke up at around noon.  Past Medical History:  Diagnosis Date  . Adenocarcinoma of colon [facility]) oncologist-  dr Truitt Merle---  last chemo 01-17-2015 to 06-27-2015)   dx 05-17-2014 sigmoid (left) cancer, Stage 2, G1 (pT3N0M0), MSI stable -- s/p  sigmoidectomy /  dx 12-08-2014 ascending colon cancer (right) , Stage 3B, G2, (pT4aN1cM0), MSI stable -- s/p  right hemicolectomy/  08-2015  recurrent peritoneal carcinomatosis  . Arthritis    knees  . Coronary atherosclerosis   . Diverticulosis of colon   . GERD (gastroesophageal reflux disease)   . HH (hiatus hernia)   . History of basal cell carcinoma excision    right side nose 2013  s/p  moh's  . History of epistaxis    2005  caurterized twice  . History of esophageal dilatation    2009 for stricture  . History of esophagitis    reflux  . Internal hemorrhoids   . Macular degeneration of both eyes   . Multinodular thyroid    remote hx benign  bx 2005  . Peritoneal carcinomatosis ([place])    secondary to primary colon cancer  . Pulmonary nodule, right    noted since 2014-- stable per last ct 06-15-2015  . Thoracic ascending aortic aneurysm [facility])    followed by dr Cyndia Bent ([facility])  last ct 06-15-2015  4.3cm    Patient Active Problem List   Diagnosis Date Noted  . Metastatic colon cancer in female [facility])   . Enteritis 09/30/2016  . Port catheter in place 02/08/2016  . Cancer of left colon ([place]) 02/21/2015  . Diarrhea 02/01/2015  . Hand foot syndrome 02/01/2015  . Anorexia 02/01/2015  . Mucositis due to antineoplastic therapy 02/01/2015  . Encounter for chemotherapy management 12/23/2014  . Cancer of right colon ([place]) 05/17/2014  . Internal hemorrhoids with complication - Grade 3 prolapse 04/07/2014  . Rectocele 04/07/2014  . Pelvic floor dysfunction suspected  04/07/2014  . Nodule of right lung 10/11/2012  . Diverticulosis of colon with hemorrhage 01/24/2012  . ESOPHAGEAL STRICTURE 12/29/2007  . GERD 12/29/2007    Past Surgical History:  Procedure Laterality Date  . COLONOSCOPY WITH PROPOFOL N/A 09/30/2014   Procedure: COLONOSCOPY WITH PROPOFOL;  Surgeon: Milus Banister, MD;  Location: [facility];  Service: Endoscopy;  Laterality: N/A;  . ESOPHAGOGASTRODUODENOSCOPY  2009  . INGUINAL HERNIA REPAIR Right early 1990s  . MOHS SURGERY  11/2011   nose  . PORTACATH PLACEMENT Right 01/24/2016  Procedure: INSERTION PORT-A-CATH;  Surgeon: Leighton Ruff, MD;  Location: [facility];  Service: General;  Laterality: Right;  . ROBOTIC ASSISTED RIGHT HEMICOLECTOMY  16/55/3748    dr Leighton Ruff  . ROBOTIC ASSISTED SIGMOIDECTOMY  27/08/8673   dr Leighton Ruff  . UPPER GASTROINTESTINAL ENDOSCOPY    . VAGINAL HYSTERECTOMY  age 86   partial with the bladder tack    OB History    No data available       Home Medications    Prior to Admission medications   Medication Sig Start Date End Date Taking? Authorizing  Provider  amLODipine (NORVASC) 5 MG tablet Take 1 tablet (5 mg total) by mouth daily. 11/29/16  Yes Tanner, Lyndon Code., PA-C  Ascorbic Acid (VITAMIN C) 1000 MG tablet Take 1,000 mg by mouth daily.   Yes [provider]  Biotin 1000 MCG tablet Take 1,000 mcg by mouth daily.    Yes [provider]  CALCIUM & MAGNESIUM CARBONATES PO Take 1 tablet by mouth daily.   Yes [provider]  cholecalciferol (VITAMIN D) 1000 UNITS tablet Take 1,000 Units by mouth daily.   Yes [provider]  diphenoxylate-atropine (LOMOTIL) 2.5-0.025 MG tablet TAKE 1 TO 2 TABLETS BY MOUTH FOUR (4) TIMES DAILY AS NEEDED FOR DIARRHEA OR LOOSE STOOLS. 11/27/16  Yes Truitt Merle, MD  fluticasone [facility]) 50 MCG/ACT nasal spray Place 1 spray into both nostrils daily as needed for allergies.    Yes [provider]  glucosamine-chondroitin 500-400 MG tablet Take 1 tablet by mouth daily.    Yes [provider]  HYDROcodone-acetaminophen (NORCO/VICODIN) 5-325 MG tablet Take 1 tablet by mouth every 6 (six) hours as needed. Patient taking differently: Take 0.5 tablets by mouth every 6 (six) hours as needed for moderate pain or severe pain.  11/19/16  Yes Truitt Merle, MD  ibuprofen (ADVIL,MOTRIN) 200 MG tablet Take 200 mg by mouth daily as needed for moderate pain.    Yes [provider]  loratadine (CLARITIN) 10 MG tablet Take 10 mg by mouth daily.   Yes [provider]  Multiple Vitamin (MULTIVITAMIN) tablet Take 1 tablet by mouth daily.   Yes [provider]  potassium chloride SA (K-DUR,KLOR-CON) 20 MEQ tablet Take 1 tablet (20 mEq total) by mouth daily. 05/02/15  Yes Truitt Merle, MD  prochlorperazine (COMPAZINE) 10 MG tablet Take 1 tablet (10 mg total) by mouth every 8 (eight) hours as needed for nausea (Nausea or vomiting). 09/20/16  Yes Truitt Merle, MD  vitamin B-12 (CYANOCOBALAMIN) 1000 MCG tablet Take 1,000 mcg by mouth daily.    Yes [provider]    alendronate (FOSAMAX) 70 MG tablet Take 70 mg by mouth every 7 (seven) days. Sundays. Take with a full glass of water on an empty stomach.    [provider]  Homeopathic Products ([employer] LEG CRAMPS PAIN RELIEF PO) Take 1 tablet by mouth daily as needed (for leg cramps).    [provider]  lidocaine-prilocaine (EMLA) cream Apply to affected area once Patient not taking: Reported on 12/06/2016 01/12/16   Truitt Merle, MD  ondansetron (ZOFRAN) 8 MG tablet Take 1 tablet (8 mg total) by mouth every 8 (eight) hours as needed (Nausea or vomiting). Patient not taking: Reported on 12/06/2016 01/12/16   Truitt Merle, MD  ranitidine (ZANTAC) 150 MG tablet Take 150 mg by mouth as needed for heartburn.    [provider]  trifluridine-tipiracil (LONSURF) 20-8.19 MG tablet Take 3 tabs (35m  trifluridine) by mouth twice daily within 1 hr of AM & PM meals on days 1-5 and 8-12, repeat every 28 days 11/21/16   Truitt Merle, MD    Family History Family History  Problem Relation Age of Onset  . Colon cancer Paternal Grandfather 57       colon cancer   . Stroke Father   . Heart attack Son 54       died of heart attack   . Esophageal cancer Neg Hx   . Rectal cancer Neg Hx   . Stomach cancer Neg Hx     Social History Social History  Substance Use Topics  . Smoking status: Never Smoker  . Smokeless tobacco: Never Used  . Alcohol use No     Allergies   Sulfa antibiotics; Amoxicillin-pot clavulanate; and Penicillins   Review of Systems Review of Systems  Constitutional: Positive for fever.  Respiratory: Negative for cough and shortness of breath.   Cardiovascular: Negative for chest pain.  Gastrointestinal: Negative for abdominal pain.  Neurological: Negative for headaches.  All other systems reviewed and are negative.    Physical Exam Updated Vital Signs BP 131/72   Pulse 84   Temp (!) 100.9 F (38.3 C) (Oral)   Resp 19   Ht 5' 3.5" (1.613 m)   Wt 55.8 kg (123 lb)    SpO2 94%   BMI 21.45 kg/m   Physical Exam Physical Exam  Nursing note and vitals reviewed. Constitutional: Non-toxic, and in no acute distress Head: Normocephalic and atraumatic.  Mouth/Throat: Oropharynx is clear and moist.  Neck: Normal range of motion. Neck supple.  Cardiovascular: Normal rate and regular rhythm.   Pulmonary/Chest: Effort normal and breath sounds normal.  Abdominal: Soft. There is no tenderness. There is no rebound and no guarding.  Musculoskeletal: Pain with range of motion of right knee with mild overlying bruising. Significant knee effusion or overlying redness. Neurological: Alert, no facial droop, fluent speech, moves all extremities symmetrically Skin: Skin is warm and dry.  Psychiatric: Cooperative   ED Treatments / Results  Labs (all labs ordered are listed, but only abnormal results are displayed) Labs Reviewed  COMPREHENSIVE METABOLIC PANEL - Abnormal; Notable for the following:       Result Value   Chloride 98 (*)    Glucose, Bld 168 (*)    BUN 22 (*)    Creatinine, Ser 1.24 (*)    Calcium 8.6 (*)    AST 45 (*)    Total Bilirubin 1.7 (*)    GFR calc non Af Amer 40 (*)    GFR calc Af Amer 46 (*)    All other components within normal limits  CBC WITH DIFFERENTIAL/PLATELET - Abnormal; Notable for the following:    WBC 3.6 (*)    RBC 3.07 (*)    Hemoglobin 9.4 (*)    HCT 28.1 (*)    RDW 16.4 (*)    Platelets 83 (*)    Lymphs Abs 0.6 (*)    All other components within normal limits  URINALYSIS, ROUTINE W REFLEX MICROSCOPIC - Abnormal; Notable for the following:    Protein, ur 100 (*)    All other components within normal limits  SEDIMENTATION RATE - Abnormal; Notable for the following:    Sed Rate 60 (*)    All other components within normal limits  C-REACTIVE PROTEIN - Abnormal; Notable for the following:    CRP 11.3 (*)    All other components within normal limits  I-STAT  CG4 LACTIC ACID, ED - Abnormal; Notable for the following:     Lactic Acid, Venous 1.92 (*)    All other components within normal limits  CULTURE, BLOOD (ROUTINE X 2)  CULTURE, BLOOD (ROUTINE X 2)  URINE CULTURE  PROTIME-INR  I-STAT CG4 LACTIC ACID, ED    EKG  EKG Interpretation None       Radiology Dg Chest 2 View  Result Date: 12/06/2016 CLINICAL DATA:  Patient with elevated temperature. EXAM: CHEST  2 VIEW COMPARISON:  Chest radiograph 09/30/2016. FINDINGS: Monitoring leads overlie the patient. Right anterior chest wall Port-A-Cath is present with tip projecting over the superior vena cava. Stable cardiomegaly. No consolidative pulmonary opacities. No pleural effusion or pneumothorax. Thoracic spine degenerative changes. IMPRESSION: No acute cardiopulmonary process. Electronically Signed   By: Lovey Newcomer M.D.   On: 12/06/2016 20:10   Dg Knee 2 Views Right  Result Date: 12/06/2016 CLINICAL DATA:  RIGHT knee pain EXAM: RIGHT KNEE - 1-2 VIEW COMPARISON:  09/27/2016 FINDINGS: Narrowing of the lateral joint space with osteophytosis and sclerosis. Osteophytosis of the medial compartment. Osteophytosis of patella. No effusion. No fracture. IMPRESSION: Tricompartment osteoarthritis most severe in the lateral compartment. No acute findings Electronically Signed   By: Suzy Bouchard M.D.   On: 12/06/2016 20:16    Procedures Procedures (including critical care time)  Medications Ordered in ED Medications  sodium chloride 0.9 % bolus 1,000 mL (0 mLs Intravenous Stopped 12/06/16 2048)  acetaminophen (TYLENOL) tablet 650 mg (650 mg Oral Given 12/06/16 1943)  HYDROcodone-acetaminophen (NORCO/VICODIN) 5-325 MG per tablet 1 tablet (1 tablet Oral Given 12/06/16 1943)     Initial Impression / Assessment and Plan / ED Course  I have reviewed the triage vital signs and the nursing notes.  Pertinent labs & imaging results that were available during my care of the patient were reviewed by me and considered in my medical decision making (see chart for  details).     83 year old female with history of Sigmoid adenocarcinoma with peritoneal carcinomatosis who presents with fever for one day. She is nontoxic in no acute distress. Does have fever here but is hemodynamically stable. No hypoxia or respiratory distress.  Sepsis workup was pursued. She has mild leukopenia but no evidence of neutropenia. No evidence of UTI or pneumonia. Abdomen is benign. Do not suspect intraabdominal process. Right knee is not swollen or red, and she has normal ROM. X-ray of the knee shows tricompartmental arthritis, there is no effusion. Do not suspect septic arthritis.  She has had right lower back pain for several weeks now, that is unchanged from baseline. ON chart review, with CT scan in 10/2016 showing right psoas muscle metastatic tumor, and this is likely what is causing her symptoms. Blood culture pending.  At this time no clear source of fever, but no other infectious symptoms. Possible early viral illness. Discussed with Dr. Benay Spice, who thinks this could be viral or tumor fever. He recommends discharge home with close outpatient follow-up. Patient reports she sees Dr. Burr Medico tomorrow. Strict return and follow-up instructions reviewed. She expressed understanding of all discharge instructions and felt comfortable with the plan of care.   Final Clinical Impressions(s) / ED Diagnoses   Final diagnoses:  Fever, unspecified fever cause    New Prescriptions New Prescriptions   No medications on file     Forde Dandy, MD 12/06/16 2334

## 2016-12-06 NOTE — ED Notes (Signed)
Assisted patient to the restroom via wheelchair. Patient reports she was unable to urinate but had a bowel movement.

## 2016-12-06 NOTE — Discharge Instructions (Signed)
Please see Dr. Burr Medico tomorrow for close follow-up as scheduled  There is no obvious source of infection today. This may be a viral illness.  Please return without fail for worsening symptoms, including confusion, difficulty breathing, severe weakness or fatigue, or any other symptoms concerning to you.

## 2016-12-06 NOTE — Telephone Encounter (Signed)
Daughter called that she found her mom had a fever today. She had slept 11 pm to about 1230 noon today, and was not with it on the phone. She got up and dressed then went back to sleep on sofa until daughter got there about 3 pm. Fever about 3pm was 101.6, now at 353 pm temp is 100.9. No tylenol given. No cough, no chill, no diarrhea, no urinary sx. Pt is lucid.   Last lonsurf taken on 10/12, due to restart Monday 10/29.  Has appt with Dr Burr Medico at 130 tomorrow.   Alternate [phone]  S/w Dr Burr Medico and called Amy back. Pt was again asleep on the couch.  Requested she take temp again while on the phone. Temp still 100.7. Instructed to take pt to ER or urgent care to have her checked out. Reminded Amy of the chemo wallet card. Looking for neutropenia and possible infection.   Amy in agreement

## 2016-12-07 ENCOUNTER — Encounter: Payer: Self-pay | Admitting: Hematology

## 2016-12-07 ENCOUNTER — Other Ambulatory Visit: Payer: PPO

## 2016-12-07 ENCOUNTER — Ambulatory Visit (HOSPITAL_BASED_OUTPATIENT_CLINIC_OR_DEPARTMENT_OTHER): Payer: PPO | Admitting: Hematology

## 2016-12-07 ENCOUNTER — Telehealth: Payer: Self-pay

## 2016-12-07 ENCOUNTER — Ambulatory Visit (HOSPITAL_BASED_OUTPATIENT_CLINIC_OR_DEPARTMENT_OTHER): Payer: PPO

## 2016-12-07 VITALS — BP 135/76 | HR 99 | Temp 98.9°F | Resp 20 | Ht 63.5 in | Wt 125.8 lb

## 2016-12-07 VITALS — BP 143/79 | HR 91

## 2016-12-07 DIAGNOSIS — C186 Malignant neoplasm of descending colon: Secondary | ICD-10-CM

## 2016-12-07 DIAGNOSIS — M25561 Pain in right knee: Secondary | ICD-10-CM

## 2016-12-07 DIAGNOSIS — G620 Drug-induced polyneuropathy: Secondary | ICD-10-CM | POA: Diagnosis not present

## 2016-12-07 DIAGNOSIS — C187 Malignant neoplasm of sigmoid colon: Secondary | ICD-10-CM | POA: Diagnosis not present

## 2016-12-07 DIAGNOSIS — R110 Nausea: Secondary | ICD-10-CM

## 2016-12-07 DIAGNOSIS — G893 Neoplasm related pain (acute) (chronic): Secondary | ICD-10-CM | POA: Diagnosis not present

## 2016-12-07 DIAGNOSIS — C182 Malignant neoplasm of ascending colon: Secondary | ICD-10-CM

## 2016-12-07 DIAGNOSIS — R634 Abnormal weight loss: Secondary | ICD-10-CM | POA: Diagnosis not present

## 2016-12-07 DIAGNOSIS — C786 Secondary malignant neoplasm of retroperitoneum and peritoneum: Secondary | ICD-10-CM

## 2016-12-07 DIAGNOSIS — M545 Low back pain: Secondary | ICD-10-CM

## 2016-12-07 DIAGNOSIS — Z7189 Other specified counseling: Secondary | ICD-10-CM

## 2016-12-07 MED ORDER — HEPARIN SOD (PORK) LOCK FLUSH 100 UNIT/ML IV SOLN
500.0000 [IU] | Freq: Once | INTRAVENOUS | Status: AC
  Administered 2016-12-07: 500 [IU] via INTRAVENOUS
  Filled 2016-12-07: qty 5

## 2016-12-07 MED ORDER — SODIUM CHLORIDE 0.9% FLUSH
10.0000 mL | INTRAVENOUS | Status: DC | PRN
  Administered 2016-12-07: 10 mL via INTRAVENOUS
  Filled 2016-12-07: qty 10

## 2016-12-07 MED ORDER — SODIUM CHLORIDE 0.9 % IV SOLN
INTRAVENOUS | Status: AC
  Administered 2016-12-07: 14:00:00 via INTRAVENOUS

## 2016-12-07 NOTE — Telephone Encounter (Signed)
Referral call to [facility].

## 2016-12-07 NOTE — Patient Instructions (Signed)

## 2016-12-08 LAB — URINE CULTURE: Culture: NO GROWTH

## 2016-12-09 ENCOUNTER — Encounter: Payer: Self-pay | Admitting: Hematology

## 2016-12-09 DIAGNOSIS — Z7189 Other specified counseling: Secondary | ICD-10-CM | POA: Insufficient documentation

## 2016-12-09 DIAGNOSIS — G893 Neoplasm related pain (acute) (chronic): Secondary | ICD-10-CM | POA: Insufficient documentation

## 2016-12-10 DIAGNOSIS — M4686 Other specified inflammatory spondylopathies, lumbar region: Secondary | ICD-10-CM | POA: Diagnosis not present

## 2016-12-11 LAB — CULTURE, BLOOD (ROUTINE X 2)
CULTURE: NO GROWTH
CULTURE: NO GROWTH
Special Requests: ADEQUATE
Special Requests: ADEQUATE

## 2016-12-12 DIAGNOSIS — M4686 Other specified inflammatory spondylopathies, lumbar region: Secondary | ICD-10-CM | POA: Diagnosis not present

## 2016-12-12 DIAGNOSIS — M5136 Other intervertebral disc degeneration, lumbar region: Secondary | ICD-10-CM | POA: Diagnosis not present

## 2017-01-04 ENCOUNTER — Other Ambulatory Visit: Payer: Self-pay | Admitting: Nurse Practitioner

## 2017-01-04 ENCOUNTER — Telehealth: Payer: Self-pay | Admitting: *Deleted

## 2017-01-04 ENCOUNTER — Other Ambulatory Visit: Payer: Self-pay | Admitting: *Deleted

## 2017-01-04 DIAGNOSIS — C785 Secondary malignant neoplasm of large intestine and rectum: Secondary | ICD-10-CM

## 2017-01-04 MED ORDER — SENNA 8.6 MG PO TABS: 1.0000 | ORAL_TABLET | Freq: Two times a day (BID) | ORAL | 0 refills | Status: DC | PRN

## 2017-01-04 MED ORDER — HYDROCODONE-ACETAMINOPHEN 5-325 MG PO TABS: 1.0000 | ORAL_TABLET | Freq: Four times a day (QID) | ORAL | 0 refills | Status: DC | PRN

## 2017-01-04 MED ORDER — SENNA 8.6 MG PO TABS: 1.0000 | ORAL_TABLET | Freq: Two times a day (BID) | ORAL | 0 refills | Status: AC | PRN

## 2017-01-04 NOTE — Telephone Encounter (Signed)
"  Thomes Dinning RN calling to request refill be faxed to [facility] Ph: [phone] for Oxycodone refill and order to [facility]  Hospice order for Senokot-S, take one pill twice a day for constipation, hold for loose bowels.  Let us know if our provider needs to refill oxycodone."  Routing to APP for further assistance.

## 2017-01-07 NOTE — Telephone Encounter (Signed)
1412 01-04-2017: Spoke with Regan Rakers NP about Hospice request.  No further questions or needs.

## 2017-01-16 DIAGNOSIS — M1711 Unilateral primary osteoarthritis, right knee: Secondary | ICD-10-CM | POA: Diagnosis not present

## 2017-01-23 DIAGNOSIS — M1711 Unilateral primary osteoarthritis, right knee: Secondary | ICD-10-CM | POA: Diagnosis not present

## 2017-01-30 DIAGNOSIS — M1711 Unilateral primary osteoarthritis, right knee: Secondary | ICD-10-CM | POA: Diagnosis not present

## 2017-02-01 ENCOUNTER — Telehealth: Payer: Self-pay | Admitting: Medical Oncology

## 2017-02-01 ENCOUNTER — Other Ambulatory Visit: Payer: Self-pay | Admitting: Medical Oncology

## 2017-02-01 NOTE — Telephone Encounter (Signed)
Hospice pt -New abdominal pain last night 7/10 and vomited last night 4x's .  This happened after she ate several cheese products). Today abdominal pain persists but pain is 3/10. LBM was Tuesday "normal" . Today she had 1 small acorn size stool today. RN did not feel any stool in rectum. She took one Senakot yesterday . Vs 140/100, 96 , no fever.

## 2017-02-01 NOTE — Telephone Encounter (Addendum)
Hospice nurse reports pt has new abd pain last night 7/10 persisting today but decreased intensity 3/10. Ate cheese products last night prior to pain then vomited 4 x last night. Last BM normal on Tuesday . Today 1 small acorn size stool , no stool in rectum . Took hydrocodone last night. Per Dr Alvy Bimler I  instructed nurse to increase Senakot to 2 tabs tid and may double pain med. RN voiced understanding.

## 2017-02-07 ENCOUNTER — Telehealth: Payer: Self-pay | Admitting: *Deleted

## 2017-02-07 NOTE — Telephone Encounter (Signed)
Heidi Marquez,  This pt is under hospice care. Her symptoms are likely related to her cancer.  Rosalind, I am out of office today, please call her hospice to see how we can help them.   Truitt Merle MD

## 2017-02-07 NOTE — Telephone Encounter (Signed)
Called Sam/RN/Hospice & discussed pt.  He thinks she would like to be seen but he wants to talk to her again in am with daughter & will let us know something in am.  We will then discuss with Dr Burr Medico.

## 2017-02-07 NOTE — Telephone Encounter (Signed)
I believe that this patient needs to be seen either today or tomorrow. She will need a KUB, CBC, and CMET. Please let be know what she wants to do. Thanks, Lucianne Lei

## 2017-02-07 NOTE — Telephone Encounter (Signed)
"  Sam seeing patient again today.  Called 02-04-2017 in reference to abdominal pain.  Today, she is still complaining of abdominal pain everytime she eats.  Abdomin is firm, distended, tender to touch.with hyperactive bowel sounds.  has taken one Hydrocodone recently,  Loose stool yesterday.  Frequent nausea using prochlorperazine.  Does provider have any further advice for her abdomen.  Return [phone]."  Routing call information to collaborative nurse and provider for review.  Further patient communication through collaborative nurse.number

## 2017-02-13 ENCOUNTER — Telehealth: Payer: Self-pay | Admitting: *Deleted

## 2017-02-13 DIAGNOSIS — C785 Secondary malignant neoplasm of large intestine and rectum: Secondary | ICD-10-CM

## 2017-02-13 MED ORDER — HYDROCODONE-ACETAMINOPHEN 5-325 MG PO TABS: ORAL_TABLET | ORAL | 0 refills | Status: DC

## 2017-02-13 NOTE — Telephone Encounter (Signed)
Received call from Sam/RN/Hospice asking for refill on hydrocodone.  She is presently taking 1-2 tabs every 6 hours prn.  Send to [facility].  Message to Dr Burr Medico.

## 2017-02-20 ENCOUNTER — Other Ambulatory Visit: Payer: Self-pay | Admitting: Hematology

## 2017-02-20 DIAGNOSIS — C182 Malignant neoplasm of ascending colon: Secondary | ICD-10-CM

## 2017-02-20 DIAGNOSIS — C186 Malignant neoplasm of descending colon: Secondary | ICD-10-CM

## 2017-03-04 ENCOUNTER — Telehealth: Payer: Self-pay | Admitting: Medical Oncology

## 2017-03-04 NOTE — Telephone Encounter (Signed)
Can compazine  be every 6 hours prn instead of every 8 hours prn? Pt having a lot of nausea . Hospice will not pay for zofran.

## 2017-03-04 NOTE — Telephone Encounter (Signed)
Compazine dose q 6 hour prn called to sam

## 2017-03-07 ENCOUNTER — Telehealth: Payer: Self-pay | Admitting: *Deleted

## 2017-03-07 NOTE — Telephone Encounter (Signed)
Please ask am to chemo pt's orthostatic BP, if SBP at sitting, or standing <100, then hold norvasc. Thanks   Truitt Merle MD

## 2017-03-07 NOTE — Telephone Encounter (Signed)
Received call from Thomes Dinning , RN @ [facility] re:  Pt's BP readings for past several visits :  Pt is on  Amlodipine  5 ng daily. 1/9     112/68 1/16   132/74 1/24   118/68. Pt has been increasingly weak - has to have hospital bed now.  Has dizzy spells with ambulating. Sam would like to know if  Dr. Burr Medico would give parameters as to when Norvasc should be held. Sam's    Phone     [phone].

## 2017-03-11 ENCOUNTER — Telehealth: Payer: Self-pay | Admitting: *Deleted

## 2017-03-11 NOTE — Telephone Encounter (Signed)
Received call from [place] stating that pt hasn't taken BP med in a week or so since BP low.  Today BP 96/60, p. 88, Resp 16, O2 sat 90% on RA.  She is weak & unable to stand & weight is down to 98. She is dizzy with change in position & doesn't have strength to move herself in bed. She had some n/v & coughing yest & he heard some crackles in R base today.  He also reports 2+ edema R lower extremity & 1+ L lower extremity. He feels that she is progressing.  Discussed with Dr Burr Medico & suggested compression stockings for dizziness & may eat more salty foods & cont to hold BP med & drink more.  Message given to Sam.

## 2017-03-13 ENCOUNTER — Telehealth: Payer: Self-pay | Admitting: *Deleted

## 2017-03-13 NOTE — Telephone Encounter (Signed)
Received vm call from [facility] RN/Hospice/[place] stating pt's VS 94/50, P 91, R 24 which is up from usual 18-20, T 97.1, O2 Sat 90%.  Pt denies SOB.  She still has 2 + edema LLE & 1 + RLE & he is now hearing course crackles throught lungs vs course crackles in R base on 12/28. She cont to have some brown vomiting. Sam was concerned that she may have aspirated but now wondering if she is in heart failure.  Call back # [phone].  Message to Dr Burr Medico.  Discussed with Dr Burr Medico & as long as pt can lay flat & has no trouble breathing & no fever there are no further orders but would order ATB if she runs fever.  Called Sam & gave this info & he expressed understanding.

## 2017-03-25 ENCOUNTER — Telehealth: Payer: Self-pay | Admitting: *Deleted

## 2017-03-25 DIAGNOSIS — C785 Secondary malignant neoplasm of large intestine and rectum: Secondary | ICD-10-CM

## 2017-03-25 MED ORDER — HYDROCODONE-ACETAMINOPHEN 5-325 MG PO TABS: ORAL_TABLET | ORAL | 0 refills | Status: AC

## 2017-03-25 NOTE — Telephone Encounter (Signed)
Received vm call from Sam/RN/Hospice/[place] asking for 15 day supply of vicodin for pt.  Rx faxed to [facility].

## 2017-03-29 ENCOUNTER — Telehealth: Payer: Self-pay | Admitting: *Deleted

## 2017-03-29 NOTE — Telephone Encounter (Signed)
Received vm call from [facility] RN/Hospice with update that pt is minimally responsive & reluctant to take meds.  She took her anxiety med @ 9a & 1/2 of pain pill @ 11:30 am but told her daughter  "no meds"  & later told her "I want to die".  VS:  BP 100/64, p 100, resp 22 & O2 sat 98%.  Informed Dr Burr Medico.

## 2017-04-03 ENCOUNTER — Other Ambulatory Visit: Payer: Self-pay | Admitting: *Deleted

## 2017-04-03 ENCOUNTER — Telehealth: Payer: Self-pay | Admitting: *Deleted

## 2017-04-03 DIAGNOSIS — C186 Malignant neoplasm of descending colon: Secondary | ICD-10-CM

## 2017-04-03 MED ORDER — HYDROXYZINE HCL 10 MG/5ML PO SYRP: 10.0000 mg | ORAL_SOLUTION | Freq: Three times a day (TID) | ORAL | 0 refills | Status: DC | PRN

## 2017-04-03 MED ORDER — HYDROXYZINE HCL 10 MG/5ML PO SYRP: 10.0000 mg | ORAL_SOLUTION | Freq: Three times a day (TID) | ORAL | 0 refills | Status: AC | PRN

## 2017-04-03 NOTE — Telephone Encounter (Signed)
Spoke with hospice nurse Thomes Dinning, and informed him that Atarax will be called in to pt's pharmacy for itching. Script called in for Atarax 10mg /19ml  Every  8 hours as needed for itching.

## 2017-04-03 NOTE — Telephone Encounter (Signed)
"  Heidi Marquez with Hospice, this patient is active.  Is Dr. Burr Medico is willing to increase frequency of Roxanol for pain, dyspnea, agitation from every four hours to every two or one hours.  Moaning, starting to move after being medicated two hours ago.  Cheyne stokes respirations.  RR = 16, O2 Sat = 95 %, HR = 102, LE are cool with no palpable pulse." Collaborative gave notice to notify Heidi to titrate pain to patient comfort.   Patient's family would like something for itching after noting her scratching her chest.  Call me [phone]) or her daughter [phone])."

## 2017-04-08 ENCOUNTER — Telehealth: Payer: Self-pay | Admitting: *Deleted

## 2017-04-08 NOTE — Telephone Encounter (Signed)
Received vm call from [facility]/Hospice/[place] stating that pt expired on 2017-04-21 @ 738 pm.

## 2017-04-12 DEATH — deceased

## 2017-04-25 ENCOUNTER — Other Ambulatory Visit: Payer: Self-pay | Admitting: Nurse Practitioner

## 2018-03-09 IMAGING — CT NM PET TUM IMG RESTAG (PS) SKULL BASE T - THIGH
1 of 8 series · 1 of 25 positions shown · non-contrast
Comparison: PET-CT 10/07/2012. Abdominal pelvic CT 08/15/2015.
Chest CT 06/15/2015.

CLINICAL DATA: Subsequent treatment strategy for colon cancer.
Abnormal CT.

EXAM:
NUCLEAR MEDICINE PET SKULL BASE TO THIGH
TECHNIQUE: 6.58 mCi F-18 FDG was injected intravenously. Full-ring PET imaging
was performed from the skull base to thigh after the radiotracer. CT
data was obtained and used for attenuation correction and anatomic
localization.
FASTING BLOOD GLUCOSE:  Value: 93 mg/dl

[Series 4: ct sk_thigh 5.0 hd_fov · axial · 5.0mm · 1.07mm/px · 1 of 201 slices shown]
[im 201/201  brain]
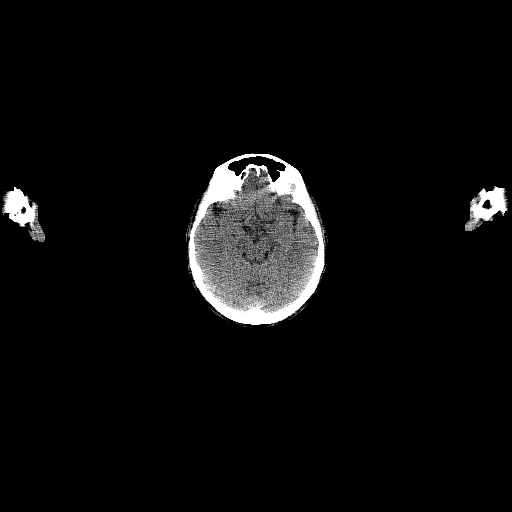

[1 of 25 positions shown; findings below may reference images not displayed]

FINDINGS: NECK

No hypermetabolic cervical lymph nodes are identified.There are no
lesions of the pharyngeal mucosal space. Multinodular goiter again
noted with asymmetric enlargement of the left lobe. No suspicious
hypermetabolic thyroid activity.

CHEST

There are no hypermetabolic mediastinal, hilar or axillary lymph
nodes. No suspicious pulmonary activity. Probable focus of
postinflammatory scarring in the right upper lobe along the superior
aspect of the major fissure is again noted. Scattered areas of
pulmonary parenchymal scarring are present. There are no suspicious
pulmonary nodules.

ABDOMEN/PELVIS

There is no hypermetabolic activity within the liver, adrenal
glands, spleen or pancreas. There is no hypermetabolic nodal
activity. There are multiple hypermetabolic peritoneal nodules
consistent with peritoneal carcinomatosis. The largest nodule
anteriorly in the midline, inferior to the umbilicus measures 18 x
15 mm on image 146 and has an SUV max of 8.3. Other representative
nodules measure 13 mm adjacent to the right colon on image 132 (SUV
max 6.8) and there is a collection of small nodules in the right
pericolic gutter which have enlarged and demonstrated SUV max of
6.0. There is no ascites. Small gallstones, a small splenic cyst and
minimal atherosclerosis noted. There are postsurgical changes in the
proximal and distal colon.

SKELETON

There is no hypermetabolic activity to suggest osseous metastatic
disease.
IMPRESSION: 1. Multiple enlarging, hypermetabolic peritoneal nodules consistent
with peritoneal carcinomatosis. No generalized ascites.
2. No evidence of metastatic disease to the liver.
3. No suspicious pulmonary findings. Grossly stable chronic
postinflammatory changes.
4. Multinodular goiter.
